# Patient Record
Sex: Female | Born: 1935
Health system: Southern US, Community
[De-identification: ages and names within clinical notes are randomized; demographics above are authoritative.]

## PROBLEM LIST (undated history)

## (undated) DIAGNOSIS — R7303 Prediabetes: Secondary | ICD-10-CM

## (undated) DIAGNOSIS — I10 Essential (primary) hypertension: Secondary | ICD-10-CM

## (undated) DIAGNOSIS — L03116 Cellulitis of left lower limb: Secondary | ICD-10-CM

## (undated) DIAGNOSIS — IMO0002 Reserved for concepts with insufficient information to code with codable children: Secondary | ICD-10-CM

## (undated) DIAGNOSIS — M1990 Unspecified osteoarthritis, unspecified site: Secondary | ICD-10-CM

## (undated) DIAGNOSIS — F329 Major depressive disorder, single episode, unspecified: Secondary | ICD-10-CM

## (undated) DIAGNOSIS — K6289 Other specified diseases of anus and rectum: Secondary | ICD-10-CM

## (undated) DIAGNOSIS — F32A Depression, unspecified: Secondary | ICD-10-CM

## (undated) DIAGNOSIS — E785 Hyperlipidemia, unspecified: Secondary | ICD-10-CM

## (undated) DIAGNOSIS — I639 Cerebral infarction, unspecified: Secondary | ICD-10-CM

## (undated) DIAGNOSIS — C44301 Unspecified malignant neoplasm of skin of nose: Secondary | ICD-10-CM

## (undated) DIAGNOSIS — R32 Unspecified urinary incontinence: Secondary | ICD-10-CM

## (undated) DIAGNOSIS — Z9289 Personal history of other medical treatment: Secondary | ICD-10-CM

## (undated) DIAGNOSIS — T7840XA Allergy, unspecified, initial encounter: Secondary | ICD-10-CM

## (undated) HISTORY — DX: Cellulitis of left lower limb: L03.116

## (undated) HISTORY — DX: Unspecified osteoarthritis, unspecified site: M19.90

## (undated) HISTORY — DX: Allergy, unspecified, initial encounter: T78.40XA

## (undated) HISTORY — DX: Cerebral infarction, unspecified: I63.9

## (undated) HISTORY — DX: Unspecified malignant neoplasm of skin of nose: C44.301

## (undated) HISTORY — DX: Prediabetes: R73.03

## (undated) HISTORY — DX: Unspecified urinary incontinence: R32

## (undated) HISTORY — DX: Depression, unspecified: F32.A

## (undated) HISTORY — DX: Reserved for concepts with insufficient information to code with codable children: IMO0002

## (undated) HISTORY — DX: Hyperlipidemia, unspecified: E78.5

## (undated) HISTORY — DX: Personal history of other medical treatment: Z92.89

## (undated) HISTORY — DX: Major depressive disorder, single episode, unspecified: F32.9

## (undated) HISTORY — DX: Essential (primary) hypertension: I10

## (undated) HISTORY — PX: TUBAL LIGATION: SHX77

---
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
## 2004-07-20 LAB — HM MAMMOGRAPHY

## 2005-07-20 HISTORY — PX: SKIN CANCER EXCISION: SHX779

## 2006-07-20 LAB — HM PAP SMEAR

## 2011-07-21 DIAGNOSIS — Z87898 Personal history of other specified conditions: Secondary | ICD-10-CM | POA: Diagnosis not present

## 2011-07-24 DIAGNOSIS — E119 Type 2 diabetes mellitus without complications: Secondary | ICD-10-CM | POA: Diagnosis not present

## 2011-07-24 DIAGNOSIS — H25019 Cortical age-related cataract, unspecified eye: Secondary | ICD-10-CM | POA: Diagnosis not present

## 2011-08-21 DIAGNOSIS — Z87898 Personal history of other specified conditions: Secondary | ICD-10-CM | POA: Diagnosis not present

## 2011-09-18 DIAGNOSIS — Z87898 Personal history of other specified conditions: Secondary | ICD-10-CM | POA: Diagnosis not present

## 2011-10-08 DIAGNOSIS — M545 Low back pain, unspecified: Secondary | ICD-10-CM | POA: Diagnosis not present

## 2011-10-08 DIAGNOSIS — R569 Unspecified convulsions: Secondary | ICD-10-CM | POA: Diagnosis not present

## 2011-10-08 DIAGNOSIS — I69919 Unspecified symptoms and signs involving cognitive functions following unspecified cerebrovascular disease: Secondary | ICD-10-CM | POA: Diagnosis not present

## 2011-10-19 DIAGNOSIS — Z87898 Personal history of other specified conditions: Secondary | ICD-10-CM | POA: Diagnosis not present

## 2011-11-05 DIAGNOSIS — E119 Type 2 diabetes mellitus without complications: Secondary | ICD-10-CM | POA: Diagnosis not present

## 2011-11-12 DIAGNOSIS — I10 Essential (primary) hypertension: Secondary | ICD-10-CM | POA: Diagnosis not present

## 2011-11-12 DIAGNOSIS — R569 Unspecified convulsions: Secondary | ICD-10-CM | POA: Diagnosis not present

## 2011-11-12 DIAGNOSIS — E785 Hyperlipidemia, unspecified: Secondary | ICD-10-CM | POA: Diagnosis not present

## 2011-11-12 DIAGNOSIS — E119 Type 2 diabetes mellitus without complications: Secondary | ICD-10-CM | POA: Diagnosis not present

## 2011-11-18 DIAGNOSIS — Z87898 Personal history of other specified conditions: Secondary | ICD-10-CM | POA: Diagnosis not present

## 2011-12-19 DIAGNOSIS — Z87898 Personal history of other specified conditions: Secondary | ICD-10-CM | POA: Diagnosis not present

## 2012-01-18 DIAGNOSIS — Z87898 Personal history of other specified conditions: Secondary | ICD-10-CM | POA: Diagnosis not present

## 2012-02-18 DIAGNOSIS — Z87898 Personal history of other specified conditions: Secondary | ICD-10-CM | POA: Diagnosis not present

## 2012-02-26 DIAGNOSIS — R1031 Right lower quadrant pain: Secondary | ICD-10-CM | POA: Diagnosis not present

## 2012-02-26 DIAGNOSIS — I10 Essential (primary) hypertension: Secondary | ICD-10-CM | POA: Diagnosis not present

## 2012-02-26 DIAGNOSIS — N390 Urinary tract infection, site not specified: Secondary | ICD-10-CM | POA: Diagnosis not present

## 2012-02-26 DIAGNOSIS — R1903 Right lower quadrant abdominal swelling, mass and lump: Secondary | ICD-10-CM | POA: Diagnosis not present

## 2012-02-29 DIAGNOSIS — R1903 Right lower quadrant abdominal swelling, mass and lump: Secondary | ICD-10-CM | POA: Diagnosis not present

## 2012-03-09 DIAGNOSIS — I2510 Atherosclerotic heart disease of native coronary artery without angina pectoris: Secondary | ICD-10-CM | POA: Diagnosis not present

## 2012-03-09 DIAGNOSIS — N83209 Unspecified ovarian cyst, unspecified side: Secondary | ICD-10-CM | POA: Diagnosis not present

## 2012-03-09 DIAGNOSIS — E119 Type 2 diabetes mellitus without complications: Secondary | ICD-10-CM | POA: Diagnosis not present

## 2012-03-10 DIAGNOSIS — E785 Hyperlipidemia, unspecified: Secondary | ICD-10-CM | POA: Diagnosis not present

## 2012-03-10 DIAGNOSIS — R1903 Right lower quadrant abdominal swelling, mass and lump: Secondary | ICD-10-CM | POA: Diagnosis not present

## 2012-03-10 DIAGNOSIS — I10 Essential (primary) hypertension: Secondary | ICD-10-CM | POA: Diagnosis not present

## 2012-03-10 DIAGNOSIS — F411 Generalized anxiety disorder: Secondary | ICD-10-CM | POA: Diagnosis not present

## 2012-03-10 DIAGNOSIS — E119 Type 2 diabetes mellitus without complications: Secondary | ICD-10-CM | POA: Diagnosis not present

## 2012-03-10 DIAGNOSIS — R569 Unspecified convulsions: Secondary | ICD-10-CM | POA: Diagnosis not present

## 2012-03-15 DIAGNOSIS — Z01818 Encounter for other preprocedural examination: Secondary | ICD-10-CM | POA: Diagnosis not present

## 2012-03-15 DIAGNOSIS — R1909 Other intra-abdominal and pelvic swelling, mass and lump: Secondary | ICD-10-CM | POA: Diagnosis not present

## 2012-03-15 DIAGNOSIS — Z01810 Encounter for preprocedural cardiovascular examination: Secondary | ICD-10-CM | POA: Diagnosis not present

## 2012-03-17 DIAGNOSIS — K5900 Constipation, unspecified: Secondary | ICD-10-CM | POA: Diagnosis not present

## 2012-03-17 DIAGNOSIS — R1909 Other intra-abdominal and pelvic swelling, mass and lump: Secondary | ICD-10-CM | POA: Diagnosis not present

## 2012-03-17 DIAGNOSIS — E119 Type 2 diabetes mellitus without complications: Secondary | ICD-10-CM | POA: Diagnosis not present

## 2012-03-17 DIAGNOSIS — I69919 Unspecified symptoms and signs involving cognitive functions following unspecified cerebrovascular disease: Secondary | ICD-10-CM | POA: Diagnosis not present

## 2012-03-17 DIAGNOSIS — I10 Essential (primary) hypertension: Secondary | ICD-10-CM | POA: Diagnosis not present

## 2012-03-17 DIAGNOSIS — Z01818 Encounter for other preprocedural examination: Secondary | ICD-10-CM | POA: Diagnosis not present

## 2012-03-20 DIAGNOSIS — Z87898 Personal history of other specified conditions: Secondary | ICD-10-CM | POA: Diagnosis not present

## 2012-03-22 DIAGNOSIS — Z01818 Encounter for other preprocedural examination: Secondary | ICD-10-CM | POA: Diagnosis not present

## 2012-03-22 DIAGNOSIS — I7090 Unspecified atherosclerosis: Secondary | ICD-10-CM | POA: Diagnosis not present

## 2012-03-24 DIAGNOSIS — N838 Other noninflammatory disorders of ovary, fallopian tube and broad ligament: Secondary | ICD-10-CM | POA: Diagnosis not present

## 2012-03-24 DIAGNOSIS — N8353 Torsion of ovary, ovarian pedicle and fallopian tube: Secondary | ICD-10-CM | POA: Diagnosis not present

## 2012-03-24 DIAGNOSIS — E669 Obesity, unspecified: Secondary | ICD-10-CM | POA: Diagnosis not present

## 2012-03-24 DIAGNOSIS — D4959 Neoplasm of unspecified behavior of other genitourinary organ: Secondary | ICD-10-CM | POA: Diagnosis not present

## 2012-03-24 DIAGNOSIS — I2510 Atherosclerotic heart disease of native coronary artery without angina pectoris: Secondary | ICD-10-CM | POA: Diagnosis not present

## 2012-03-24 DIAGNOSIS — N839 Noninflammatory disorder of ovary, fallopian tube and broad ligament, unspecified: Secondary | ICD-10-CM | POA: Diagnosis not present

## 2012-03-24 DIAGNOSIS — E785 Hyperlipidemia, unspecified: Secondary | ICD-10-CM | POA: Diagnosis not present

## 2012-03-24 DIAGNOSIS — R0602 Shortness of breath: Secondary | ICD-10-CM | POA: Diagnosis not present

## 2012-03-24 DIAGNOSIS — Z6836 Body mass index (BMI) 36.0-36.9, adult: Secondary | ICD-10-CM | POA: Diagnosis not present

## 2012-03-24 DIAGNOSIS — E119 Type 2 diabetes mellitus without complications: Secondary | ICD-10-CM | POA: Diagnosis not present

## 2012-03-24 DIAGNOSIS — N83209 Unspecified ovarian cyst, unspecified side: Secondary | ICD-10-CM | POA: Diagnosis not present

## 2012-03-24 DIAGNOSIS — I10 Essential (primary) hypertension: Secondary | ICD-10-CM | POA: Diagnosis not present

## 2012-03-25 DIAGNOSIS — E119 Type 2 diabetes mellitus without complications: Secondary | ICD-10-CM | POA: Diagnosis not present

## 2012-03-25 DIAGNOSIS — E669 Obesity, unspecified: Secondary | ICD-10-CM | POA: Diagnosis not present

## 2012-03-25 DIAGNOSIS — I2510 Atherosclerotic heart disease of native coronary artery without angina pectoris: Secondary | ICD-10-CM | POA: Diagnosis not present

## 2012-03-25 DIAGNOSIS — I10 Essential (primary) hypertension: Secondary | ICD-10-CM | POA: Diagnosis not present

## 2012-03-25 DIAGNOSIS — N83209 Unspecified ovarian cyst, unspecified side: Secondary | ICD-10-CM | POA: Diagnosis not present

## 2012-03-25 DIAGNOSIS — E785 Hyperlipidemia, unspecified: Secondary | ICD-10-CM | POA: Diagnosis not present

## 2012-04-19 DIAGNOSIS — Z87898 Personal history of other specified conditions: Secondary | ICD-10-CM | POA: Diagnosis not present

## 2012-05-20 DIAGNOSIS — Z87898 Personal history of other specified conditions: Secondary | ICD-10-CM | POA: Diagnosis not present

## 2012-06-14 DIAGNOSIS — E119 Type 2 diabetes mellitus without complications: Secondary | ICD-10-CM | POA: Diagnosis not present

## 2012-06-19 DIAGNOSIS — Z87898 Personal history of other specified conditions: Secondary | ICD-10-CM | POA: Diagnosis not present

## 2012-06-19 HISTORY — PX: BILATERAL OOPHORECTOMY: SHX1221

## 2012-06-24 DIAGNOSIS — F341 Dysthymic disorder: Secondary | ICD-10-CM | POA: Diagnosis not present

## 2012-06-24 DIAGNOSIS — R569 Unspecified convulsions: Secondary | ICD-10-CM | POA: Diagnosis not present

## 2012-06-24 DIAGNOSIS — E785 Hyperlipidemia, unspecified: Secondary | ICD-10-CM | POA: Diagnosis not present

## 2012-06-24 DIAGNOSIS — I10 Essential (primary) hypertension: Secondary | ICD-10-CM | POA: Diagnosis not present

## 2012-06-24 DIAGNOSIS — E119 Type 2 diabetes mellitus without complications: Secondary | ICD-10-CM | POA: Diagnosis not present

## 2012-06-30 DIAGNOSIS — Z85828 Personal history of other malignant neoplasm of skin: Secondary | ICD-10-CM | POA: Diagnosis not present

## 2012-06-30 DIAGNOSIS — L821 Other seborrheic keratosis: Secondary | ICD-10-CM | POA: Diagnosis not present

## 2012-06-30 DIAGNOSIS — L97309 Non-pressure chronic ulcer of unspecified ankle with unspecified severity: Secondary | ICD-10-CM | POA: Diagnosis not present

## 2012-06-30 DIAGNOSIS — I781 Nevus, non-neoplastic: Secondary | ICD-10-CM | POA: Diagnosis not present

## 2012-07-20 DIAGNOSIS — Z87898 Personal history of other specified conditions: Secondary | ICD-10-CM | POA: Diagnosis not present

## 2012-08-04 DIAGNOSIS — L02419 Cutaneous abscess of limb, unspecified: Secondary | ICD-10-CM | POA: Diagnosis not present

## 2012-08-04 DIAGNOSIS — D638 Anemia in other chronic diseases classified elsewhere: Secondary | ICD-10-CM | POA: Diagnosis not present

## 2012-08-04 DIAGNOSIS — R32 Unspecified urinary incontinence: Secondary | ICD-10-CM | POA: Diagnosis not present

## 2012-08-04 DIAGNOSIS — R569 Unspecified convulsions: Secondary | ICD-10-CM | POA: Diagnosis not present

## 2012-08-04 DIAGNOSIS — M545 Low back pain, unspecified: Secondary | ICD-10-CM | POA: Diagnosis not present

## 2012-08-04 DIAGNOSIS — N318 Other neuromuscular dysfunction of bladder: Secondary | ICD-10-CM | POA: Diagnosis not present

## 2012-08-04 DIAGNOSIS — F0781 Postconcussional syndrome: Secondary | ICD-10-CM | POA: Diagnosis not present

## 2012-08-04 DIAGNOSIS — R609 Edema, unspecified: Secondary | ICD-10-CM | POA: Diagnosis not present

## 2012-08-04 DIAGNOSIS — Z888 Allergy status to other drugs, medicaments and biological substances status: Secondary | ICD-10-CM | POA: Diagnosis not present

## 2012-08-04 DIAGNOSIS — G8929 Other chronic pain: Secondary | ICD-10-CM | POA: Diagnosis not present

## 2012-08-04 DIAGNOSIS — N179 Acute kidney failure, unspecified: Secondary | ICD-10-CM | POA: Diagnosis not present

## 2012-08-04 DIAGNOSIS — I6789 Other cerebrovascular disease: Secondary | ICD-10-CM | POA: Diagnosis not present

## 2012-08-04 DIAGNOSIS — I519 Heart disease, unspecified: Secondary | ICD-10-CM | POA: Diagnosis not present

## 2012-08-04 DIAGNOSIS — I499 Cardiac arrhythmia, unspecified: Secondary | ICD-10-CM | POA: Diagnosis not present

## 2012-08-04 DIAGNOSIS — I872 Venous insufficiency (chronic) (peripheral): Secondary | ICD-10-CM | POA: Diagnosis not present

## 2012-08-04 DIAGNOSIS — Z885 Allergy status to narcotic agent status: Secondary | ICD-10-CM | POA: Diagnosis not present

## 2012-08-04 DIAGNOSIS — R42 Dizziness and giddiness: Secondary | ICD-10-CM | POA: Diagnosis not present

## 2012-08-04 DIAGNOSIS — Z887 Allergy status to serum and vaccine status: Secondary | ICD-10-CM | POA: Diagnosis not present

## 2012-08-04 DIAGNOSIS — Z79899 Other long term (current) drug therapy: Secondary | ICD-10-CM | POA: Diagnosis not present

## 2012-08-04 DIAGNOSIS — R51 Headache: Secondary | ICD-10-CM | POA: Diagnosis not present

## 2012-08-04 DIAGNOSIS — Z8673 Personal history of transient ischemic attack (TIA), and cerebral infarction without residual deficits: Secondary | ICD-10-CM | POA: Diagnosis not present

## 2012-08-04 DIAGNOSIS — R350 Frequency of micturition: Secondary | ICD-10-CM | POA: Diagnosis not present

## 2012-08-04 DIAGNOSIS — S0990XA Unspecified injury of head, initial encounter: Secondary | ICD-10-CM | POA: Diagnosis not present

## 2012-08-04 DIAGNOSIS — L03119 Cellulitis of unspecified part of limb: Secondary | ICD-10-CM | POA: Diagnosis not present

## 2012-08-04 DIAGNOSIS — F411 Generalized anxiety disorder: Secondary | ICD-10-CM | POA: Diagnosis not present

## 2012-08-04 DIAGNOSIS — E119 Type 2 diabetes mellitus without complications: Secondary | ICD-10-CM | POA: Diagnosis not present

## 2012-08-04 DIAGNOSIS — F329 Major depressive disorder, single episode, unspecified: Secondary | ICD-10-CM | POA: Diagnosis not present

## 2012-08-05 DIAGNOSIS — R55 Syncope and collapse: Secondary | ICD-10-CM | POA: Diagnosis not present

## 2012-08-05 DIAGNOSIS — N318 Other neuromuscular dysfunction of bladder: Secondary | ICD-10-CM | POA: Diagnosis not present

## 2012-08-05 DIAGNOSIS — L02419 Cutaneous abscess of limb, unspecified: Secondary | ICD-10-CM | POA: Diagnosis not present

## 2012-08-05 DIAGNOSIS — S0990XA Unspecified injury of head, initial encounter: Secondary | ICD-10-CM | POA: Diagnosis not present

## 2012-08-05 DIAGNOSIS — N179 Acute kidney failure, unspecified: Secondary | ICD-10-CM | POA: Diagnosis not present

## 2012-08-05 DIAGNOSIS — R42 Dizziness and giddiness: Secondary | ICD-10-CM | POA: Diagnosis not present

## 2012-08-05 DIAGNOSIS — R569 Unspecified convulsions: Secondary | ICD-10-CM | POA: Diagnosis not present

## 2012-08-05 DIAGNOSIS — I872 Venous insufficiency (chronic) (peripheral): Secondary | ICD-10-CM | POA: Diagnosis not present

## 2012-08-05 DIAGNOSIS — F0781 Postconcussional syndrome: Secondary | ICD-10-CM | POA: Diagnosis not present

## 2012-08-06 DIAGNOSIS — I872 Venous insufficiency (chronic) (peripheral): Secondary | ICD-10-CM | POA: Diagnosis not present

## 2012-08-06 DIAGNOSIS — N318 Other neuromuscular dysfunction of bladder: Secondary | ICD-10-CM | POA: Diagnosis not present

## 2012-08-06 DIAGNOSIS — L02419 Cutaneous abscess of limb, unspecified: Secondary | ICD-10-CM | POA: Diagnosis not present

## 2012-08-06 DIAGNOSIS — R42 Dizziness and giddiness: Secondary | ICD-10-CM | POA: Diagnosis not present

## 2012-08-06 DIAGNOSIS — R569 Unspecified convulsions: Secondary | ICD-10-CM | POA: Diagnosis not present

## 2012-08-06 DIAGNOSIS — N179 Acute kidney failure, unspecified: Secondary | ICD-10-CM | POA: Diagnosis not present

## 2012-08-06 DIAGNOSIS — S0990XA Unspecified injury of head, initial encounter: Secondary | ICD-10-CM | POA: Diagnosis not present

## 2012-08-06 DIAGNOSIS — F0781 Postconcussional syndrome: Secondary | ICD-10-CM | POA: Diagnosis not present

## 2012-08-07 DIAGNOSIS — L02419 Cutaneous abscess of limb, unspecified: Secondary | ICD-10-CM | POA: Diagnosis not present

## 2012-08-07 DIAGNOSIS — R42 Dizziness and giddiness: Secondary | ICD-10-CM | POA: Diagnosis not present

## 2012-08-07 DIAGNOSIS — N179 Acute kidney failure, unspecified: Secondary | ICD-10-CM | POA: Diagnosis not present

## 2012-08-07 DIAGNOSIS — R569 Unspecified convulsions: Secondary | ICD-10-CM | POA: Diagnosis not present

## 2012-08-07 DIAGNOSIS — F0781 Postconcussional syndrome: Secondary | ICD-10-CM | POA: Diagnosis not present

## 2012-08-07 DIAGNOSIS — S0990XA Unspecified injury of head, initial encounter: Secondary | ICD-10-CM | POA: Diagnosis not present

## 2012-08-07 DIAGNOSIS — N318 Other neuromuscular dysfunction of bladder: Secondary | ICD-10-CM | POA: Diagnosis not present

## 2012-08-07 DIAGNOSIS — I872 Venous insufficiency (chronic) (peripheral): Secondary | ICD-10-CM | POA: Diagnosis not present

## 2012-08-08 DIAGNOSIS — R42 Dizziness and giddiness: Secondary | ICD-10-CM | POA: Diagnosis not present

## 2012-08-08 DIAGNOSIS — F0781 Postconcussional syndrome: Secondary | ICD-10-CM | POA: Diagnosis not present

## 2012-08-08 DIAGNOSIS — N179 Acute kidney failure, unspecified: Secondary | ICD-10-CM | POA: Diagnosis not present

## 2012-08-08 DIAGNOSIS — I872 Venous insufficiency (chronic) (peripheral): Secondary | ICD-10-CM | POA: Diagnosis not present

## 2012-08-08 DIAGNOSIS — R569 Unspecified convulsions: Secondary | ICD-10-CM | POA: Diagnosis not present

## 2012-08-08 DIAGNOSIS — L02419 Cutaneous abscess of limb, unspecified: Secondary | ICD-10-CM | POA: Diagnosis not present

## 2012-08-08 DIAGNOSIS — L03119 Cellulitis of unspecified part of limb: Secondary | ICD-10-CM | POA: Diagnosis not present

## 2012-08-08 DIAGNOSIS — N318 Other neuromuscular dysfunction of bladder: Secondary | ICD-10-CM | POA: Diagnosis not present

## 2012-08-08 DIAGNOSIS — S0990XA Unspecified injury of head, initial encounter: Secondary | ICD-10-CM | POA: Diagnosis not present

## 2012-08-09 DIAGNOSIS — K5900 Constipation, unspecified: Secondary | ICD-10-CM | POA: Diagnosis not present

## 2012-08-09 DIAGNOSIS — R42 Dizziness and giddiness: Secondary | ICD-10-CM | POA: Diagnosis not present

## 2012-08-09 DIAGNOSIS — M545 Low back pain, unspecified: Secondary | ICD-10-CM | POA: Diagnosis not present

## 2012-08-09 DIAGNOSIS — F0781 Postconcussional syndrome: Secondary | ICD-10-CM | POA: Diagnosis not present

## 2012-08-09 DIAGNOSIS — Z5189 Encounter for other specified aftercare: Secondary | ICD-10-CM | POA: Diagnosis not present

## 2012-08-09 DIAGNOSIS — Z9181 History of falling: Secondary | ICD-10-CM | POA: Diagnosis not present

## 2012-08-09 DIAGNOSIS — R569 Unspecified convulsions: Secondary | ICD-10-CM | POA: Diagnosis not present

## 2012-08-09 DIAGNOSIS — IMO0001 Reserved for inherently not codable concepts without codable children: Secondary | ICD-10-CM | POA: Diagnosis not present

## 2012-08-09 DIAGNOSIS — N318 Other neuromuscular dysfunction of bladder: Secondary | ICD-10-CM | POA: Diagnosis not present

## 2012-08-09 DIAGNOSIS — Z8673 Personal history of transient ischemic attack (TIA), and cerebral infarction without residual deficits: Secondary | ICD-10-CM | POA: Diagnosis not present

## 2012-08-09 DIAGNOSIS — A499 Bacterial infection, unspecified: Secondary | ICD-10-CM | POA: Diagnosis not present

## 2012-08-09 DIAGNOSIS — N179 Acute kidney failure, unspecified: Secondary | ICD-10-CM | POA: Diagnosis not present

## 2012-08-09 DIAGNOSIS — R5381 Other malaise: Secondary | ICD-10-CM | POA: Diagnosis not present

## 2012-08-09 DIAGNOSIS — M2170 Unequal limb length (acquired), unspecified site: Secondary | ICD-10-CM | POA: Diagnosis not present

## 2012-08-09 DIAGNOSIS — L02419 Cutaneous abscess of limb, unspecified: Secondary | ICD-10-CM | POA: Diagnosis not present

## 2012-08-09 DIAGNOSIS — N390 Urinary tract infection, site not specified: Secondary | ICD-10-CM | POA: Diagnosis not present

## 2012-08-09 DIAGNOSIS — E119 Type 2 diabetes mellitus without complications: Secondary | ICD-10-CM | POA: Diagnosis not present

## 2012-08-09 DIAGNOSIS — I519 Heart disease, unspecified: Secondary | ICD-10-CM | POA: Diagnosis not present

## 2012-08-09 DIAGNOSIS — Z87898 Personal history of other specified conditions: Secondary | ICD-10-CM | POA: Diagnosis not present

## 2012-08-09 DIAGNOSIS — S069X9S Unspecified intracranial injury with loss of consciousness of unspecified duration, sequela: Secondary | ICD-10-CM | POA: Diagnosis not present

## 2012-08-09 DIAGNOSIS — D638 Anemia in other chronic diseases classified elsewhere: Secondary | ICD-10-CM | POA: Diagnosis not present

## 2012-08-09 DIAGNOSIS — S060X9A Concussion with loss of consciousness of unspecified duration, initial encounter: Secondary | ICD-10-CM | POA: Diagnosis not present

## 2012-08-09 DIAGNOSIS — I10 Essential (primary) hypertension: Secondary | ICD-10-CM | POA: Diagnosis not present

## 2012-08-09 DIAGNOSIS — S0990XA Unspecified injury of head, initial encounter: Secondary | ICD-10-CM | POA: Diagnosis not present

## 2012-08-09 DIAGNOSIS — G40909 Epilepsy, unspecified, not intractable, without status epilepticus: Secondary | ICD-10-CM | POA: Diagnosis not present

## 2012-08-09 DIAGNOSIS — F329 Major depressive disorder, single episode, unspecified: Secondary | ICD-10-CM | POA: Diagnosis not present

## 2012-08-09 DIAGNOSIS — I872 Venous insufficiency (chronic) (peripheral): Secondary | ICD-10-CM | POA: Diagnosis not present

## 2012-08-09 DIAGNOSIS — I890 Lymphedema, not elsewhere classified: Secondary | ICD-10-CM | POA: Diagnosis not present

## 2012-08-09 DIAGNOSIS — E785 Hyperlipidemia, unspecified: Secondary | ICD-10-CM | POA: Diagnosis not present

## 2012-08-22 DIAGNOSIS — K5900 Constipation, unspecified: Secondary | ICD-10-CM | POA: Diagnosis not present

## 2012-08-26 DIAGNOSIS — M79609 Pain in unspecified limb: Secondary | ICD-10-CM | POA: Diagnosis not present

## 2012-08-26 DIAGNOSIS — I2510 Atherosclerotic heart disease of native coronary artery without angina pectoris: Secondary | ICD-10-CM | POA: Diagnosis not present

## 2012-08-26 DIAGNOSIS — E119 Type 2 diabetes mellitus without complications: Secondary | ICD-10-CM | POA: Diagnosis not present

## 2012-08-26 DIAGNOSIS — L02419 Cutaneous abscess of limb, unspecified: Secondary | ICD-10-CM | POA: Diagnosis not present

## 2012-08-26 DIAGNOSIS — R609 Edema, unspecified: Secondary | ICD-10-CM | POA: Diagnosis not present

## 2012-08-26 DIAGNOSIS — I890 Lymphedema, not elsewhere classified: Secondary | ICD-10-CM | POA: Diagnosis not present

## 2012-08-26 DIAGNOSIS — B351 Tinea unguium: Secondary | ICD-10-CM | POA: Diagnosis not present

## 2012-08-26 DIAGNOSIS — IMO0001 Reserved for inherently not codable concepts without codable children: Secondary | ICD-10-CM | POA: Diagnosis not present

## 2012-08-26 DIAGNOSIS — Z87898 Personal history of other specified conditions: Secondary | ICD-10-CM | POA: Diagnosis not present

## 2012-08-26 DIAGNOSIS — G40909 Epilepsy, unspecified, not intractable, without status epilepticus: Secondary | ICD-10-CM | POA: Diagnosis not present

## 2012-08-26 DIAGNOSIS — R5381 Other malaise: Secondary | ICD-10-CM | POA: Diagnosis not present

## 2012-08-26 DIAGNOSIS — I70209 Unspecified atherosclerosis of native arteries of extremities, unspecified extremity: Secondary | ICD-10-CM | POA: Diagnosis not present

## 2012-08-26 DIAGNOSIS — Z9181 History of falling: Secondary | ICD-10-CM | POA: Diagnosis not present

## 2012-08-26 DIAGNOSIS — N390 Urinary tract infection, site not specified: Secondary | ICD-10-CM | POA: Diagnosis not present

## 2012-09-01 DIAGNOSIS — G40909 Epilepsy, unspecified, not intractable, without status epilepticus: Secondary | ICD-10-CM | POA: Diagnosis not present

## 2012-09-01 DIAGNOSIS — I2510 Atherosclerotic heart disease of native coronary artery without angina pectoris: Secondary | ICD-10-CM | POA: Diagnosis not present

## 2012-09-01 DIAGNOSIS — Z9181 History of falling: Secondary | ICD-10-CM | POA: Diagnosis not present

## 2012-09-05 DIAGNOSIS — B351 Tinea unguium: Secondary | ICD-10-CM | POA: Diagnosis not present

## 2012-09-05 DIAGNOSIS — M79609 Pain in unspecified limb: Secondary | ICD-10-CM | POA: Diagnosis not present

## 2012-09-05 DIAGNOSIS — I70209 Unspecified atherosclerosis of native arteries of extremities, unspecified extremity: Secondary | ICD-10-CM | POA: Diagnosis not present

## 2012-09-08 DIAGNOSIS — L02419 Cutaneous abscess of limb, unspecified: Secondary | ICD-10-CM | POA: Diagnosis not present

## 2012-09-13 DIAGNOSIS — L03119 Cellulitis of unspecified part of limb: Secondary | ICD-10-CM | POA: Diagnosis not present

## 2012-09-13 DIAGNOSIS — R609 Edema, unspecified: Secondary | ICD-10-CM | POA: Diagnosis not present

## 2012-09-13 DIAGNOSIS — L02419 Cutaneous abscess of limb, unspecified: Secondary | ICD-10-CM | POA: Diagnosis not present

## 2012-09-20 DIAGNOSIS — I890 Lymphedema, not elsewhere classified: Secondary | ICD-10-CM | POA: Diagnosis not present

## 2012-10-18 DIAGNOSIS — Z87898 Personal history of other specified conditions: Secondary | ICD-10-CM | POA: Diagnosis not present

## 2012-11-09 ENCOUNTER — Other Ambulatory Visit (INDEPENDENT_AMBULATORY_CARE_PROVIDER_SITE_OTHER): Payer: Medicare Other

## 2012-11-09 ENCOUNTER — Encounter: Payer: Self-pay | Admitting: Internal Medicine

## 2012-11-09 ENCOUNTER — Ambulatory Visit (INDEPENDENT_AMBULATORY_CARE_PROVIDER_SITE_OTHER): Payer: Medicare Other | Admitting: Internal Medicine

## 2012-11-09 VITALS — BP 156/84 | HR 94 | Temp 99.3°F | Ht 63.0 in | Wt 243.8 lb

## 2012-11-09 DIAGNOSIS — E785 Hyperlipidemia, unspecified: Secondary | ICD-10-CM

## 2012-11-09 DIAGNOSIS — G40909 Epilepsy, unspecified, not intractable, without status epilepticus: Secondary | ICD-10-CM | POA: Insufficient documentation

## 2012-11-09 DIAGNOSIS — I10 Essential (primary) hypertension: Secondary | ICD-10-CM

## 2012-11-09 DIAGNOSIS — N390 Urinary tract infection, site not specified: Secondary | ICD-10-CM | POA: Diagnosis not present

## 2012-11-09 DIAGNOSIS — R7303 Prediabetes: Secondary | ICD-10-CM | POA: Insufficient documentation

## 2012-11-09 DIAGNOSIS — R7309 Other abnormal glucose: Secondary | ICD-10-CM

## 2012-11-09 DIAGNOSIS — R569 Unspecified convulsions: Secondary | ICD-10-CM | POA: Diagnosis not present

## 2012-11-09 LAB — URINALYSIS, ROUTINE W REFLEX MICROSCOPIC
Nitrite: NEGATIVE
Specific Gravity, Urine: 1.01 (ref 1.000–1.030)
Total Protein, Urine: NEGATIVE
Urine Glucose: NEGATIVE
pH: 6.5 (ref 5.0–8.0)

## 2012-11-09 LAB — BASIC METABOLIC PANEL
CO2: 29 mEq/L (ref 19–32)
Calcium: 9.3 mg/dL (ref 8.4–10.5)
GFR: 72.21 mL/min (ref >60.00)
Sodium: 138 mEq/L (ref 135–145)

## 2012-11-09 LAB — LIPID PANEL
Cholesterol: 180 mg/dL (ref 0–200)
HDL: 53.5 mg/dL (ref >39.00)
Triglycerides: 110 mg/dL (ref 0.0–149.0)

## 2012-11-09 LAB — CBC
HCT: 43.4 % (ref 36.0–46.0)
MCHC: 34.1 g/dL (ref 30.0–36.0)
MCV: 98.8 fl (ref 78.0–100.0)
RDW: 13.3 % (ref 11.5–14.6)
WBC: 9.9 10*3/uL (ref 4.5–10.5)

## 2012-11-09 NOTE — Assessment & Plan Note (Signed)
Will check lipid profile today.  

## 2012-11-09 NOTE — Patient Instructions (Signed)

## 2012-11-09 NOTE — Assessment & Plan Note (Signed)
Will check A1C today Continue to work on diet and exercise

## 2012-11-09 NOTE — Assessment & Plan Note (Signed)
Pt encouraged to work on diet and exercise

## 2012-11-09 NOTE — Assessment & Plan Note (Signed)
Will check dilantin level today Pt does not want referral to neurology at this time

## 2012-11-09 NOTE — Progress Notes (Signed)
HPI  Pt presents to the clinic today to establish care. She recently moved down here from [place] to live with her daughter. She was living in a nursing home and started experiencing frequent falls. The daughter thought it was best to have her down here where she can keep a closer eye on her. She has no concerns today.  Flu: never Tetanus: never Pneumovax: never Colonoscopy: never Eye doctor: as needed Dentist: as needed Mammogram: 2009 Pap smear: 2009  Past Medical History  Diagnosis Date  . Stroke   . Hypertension   . Hyperlipidemia   . Arthritis   . Depression   . Ulcer   . History of blood transfusion   . Urinary incontinence   . Allergy   . Skin cancer of nose   . Cellulitis of left leg   . Pre-diabetes     Current Outpatient Prescriptions  Medication Sig Dispense Refill  . amitriptyline (ELAVIL) 25 MG tablet Take 25 mg by mouth at bedtime.      Marland Kitchen atorvastatin (LIPITOR) 80 MG tablet Take 80 mg by mouth daily.      . Cholecalciferol (VITAMIN D) 1000 UNITS capsule Take 1,000 Units by mouth daily.      . clopidogrel (PLAVIX) 75 MG tablet Take 75 mg by mouth daily.      Marland Kitchen diltiazem (CARDIZEM CD) 180 MG 24 hr capsule Take 180 mg by mouth daily.      Marland Kitchen docusate sodium (COLACE) 100 MG capsule Take 100 mg by mouth 2 (two) times daily.      . ferrous sulfate 325 (65 FE) MG EC tablet Take 325 mg by mouth daily.      Marland Kitchen gabapentin (NEURONTIN) 300 MG capsule 600 mg. 2 tablets in the morning and at bedtime, 1 tablet at 2pm      . hydrochlorothiazide (MICROZIDE) 12.5 MG capsule Take 12.5 mg by mouth daily.      Marland Kitchen lidocaine (LIDODERM) 5 % Place 1 patch onto the skin daily. Remove & Discard patch within 12 hours or as directed by MD      . lisinopril (PRINIVIL,ZESTRIL) 5 MG tablet Take 5 mg by mouth daily.      Marland Kitchen LORazepam (ATIVAN) 1 MG tablet Take 1 mg by mouth every 8 (eight) hours.      . nitroGLYCERIN (NITROSTAT) 0.4 MG SL tablet Place 0.4 mg under the tongue every 5 (five) minutes  as needed for chest pain.      Marland Kitchen oxybutynin (DITROPAN) 5 MG tablet Take 5 mg by mouth 3 (three) times daily.      . phenytoin (DILANTIN) 100 MG ER capsule Take 200 mg by mouth 2 (two) times daily.      . traZODone (DESYREL) 50 MG tablet Take 150 mg by mouth at bedtime.      . vitamin B-12 (CYANOCOBALAMIN) 1000 MCG tablet Take 1,000 mcg by mouth daily.       No current facility-administered medications for this visit.    Allergies  Allergen Reactions  . Codeine   . Ether     Family History  Problem Relation Age of Onset  . Heart disease Other     Parent  . Cancer Maternal Uncle     History   Social History  . Marital Status: Widowed    Spouse Name: N/A    Number of Children: 4  . Years of Education: 12   Occupational History  . Retired    Social History Main Topics  .  Smoking status: Never Smoker   . Smokeless tobacco: Never Used  . Alcohol Use: No  . Drug Use: No  . Sexually Active: No   Other Topics Concern  . Not on file   Social History Narrative   Regular exercise-no   Caffeine Use-yes    ROS:  Constitutional: Pt reports fatigue. Denies fever, malaise, headache or abrupt weight changes.  HEENT: Denies eye pain, eye redness, ear pain, ringing in the ears, wax buildup, runny nose, nasal congestion, bloody nose, or sore throat. Respiratory: Denies difficulty breathing, shortness of breath, cough or sputum production.   Cardiovascular: Pt reports swelling of the lower legs. Denies chest pain, chest tightness, palpitations or swelling in the hands.  Gastrointestinal: Denies abdominal pain, bloating, constipation, diarrhea or blood in the stool.  GU: Denies frequency, urgency, pain with urination, blood in urine, odor or discharge. Musculoskeletal: Pt reports difficulty with gait. Denies decrease in range of motion, muscle pain or joint pain and swelling.  Skin: Denies redness, rashes, lesions or ulcercations.  Neurological: Denies dizziness, difficulty with  memory, difficulty with speech or problems with balance and coordination.   No other specific complaints in a complete review of systems (except as listed in HPI above).  PE:  BP 156/84  Pulse 94  Temp(Src) 99.3 F (37.4 C) (Oral)  Ht 5\' 3"  (1.6 m)  Wt 243 lb 12 oz (110.564 kg)  BMI 43.19 kg/m2  SpO2 93% Wt Readings from Last 3 Encounters:  11/09/12 243 lb 12 oz (110.564 kg)    General: Appears her stated age, obese but well developed, well nourished in NAD. HEENT: Head: normal shape and size; Eyes: sclera white, no icterus, conjunctiva pink, PERRLA and EOMs intact; Ears: Tm's gray and intact, normal light reflex; Nose: mucosa pink and moist, septum midline; Throat/Mouth: Teeth present, mucosa pink and moist, no lesions or ulcerations noted.  Neck: Normal range of motion. Neck supple, trachea midline. No massses, lumps or thyromegaly present.  Cardiovascular: Normal rate and rhythm. S1,S2 noted.  No murmur, rubs or gallops noted. No JVD. 2 + swelling of BLE. No carotid bruits noted. Pulmonary/Chest: Normal effort and positive vesicular breath sounds. No respiratory distress. No wheezes, rales or ronchi noted.  Abdomen: Soft and nontender. Normal bowel sounds, no bruits noted. No distention or masses noted. Liver, spleen and kidneys non palpable. Musculoskeletal: Normal range of motion. No signs of joint swelling. Mild difficulty with gait. Uses walker Neurological: Alert and oriented. Cranial nerves II-XII intact. Coordination normal. +DTRs bilaterally. Psychiatric: Mood and affect normal. Behavior is normal. Judgment and thought content normal.      Assessment and Plan:  Health Maintenance:   Pt declines all immunizations No indication for colonoscopy, pap smear or mammogram Pt encouraged to visit eye doctor/ dentis yearly

## 2012-11-09 NOTE — Assessment & Plan Note (Signed)
Elevated today, pt does have history of white coat syndrome Log of BP's reviewed, well controlled at home Continue to avoid sodium in your diet Will continue current therapy at this time

## 2012-11-17 DIAGNOSIS — Z87898 Personal history of other specified conditions: Secondary | ICD-10-CM | POA: Diagnosis not present

## 2012-11-24 ENCOUNTER — Other Ambulatory Visit: Payer: Self-pay | Admitting: *Deleted

## 2012-11-24 MED ORDER — LORAZEPAM 1 MG PO TABS: 1.0000 mg | ORAL_TABLET | Freq: Three times a day (TID) | ORAL | Status: DC

## 2012-11-24 MED ORDER — OXYBUTYNIN CHLORIDE 5 MG PO TABS: 5.0000 mg | ORAL_TABLET | Freq: Three times a day (TID) | ORAL | Status: DC

## 2012-11-24 MED ORDER — PHENYTOIN SODIUM EXTENDED 100 MG PO CAPS: 200.0000 mg | ORAL_CAPSULE | Freq: Two times a day (BID) | ORAL | Status: DC

## 2012-11-24 MED ORDER — LISINOPRIL 5 MG PO TABS: 5.0000 mg | ORAL_TABLET | Freq: Every day | ORAL | Status: DC

## 2012-11-24 MED ORDER — GABAPENTIN 300 MG PO CAPS: ORAL_CAPSULE | ORAL | Status: DC

## 2012-11-24 MED ORDER — AMITRIPTYLINE HCL 25 MG PO TABS: 25.0000 mg | ORAL_TABLET | Freq: Every day | ORAL | Status: DC

## 2012-11-24 NOTE — Telephone Encounter (Signed)
Ativan Done hardcopy to robin  Gabapentin done erx  Other routine to robin

## 2012-11-24 NOTE — Telephone Encounter (Addendum)
Pt's daughter called for refills of Lisinopril, Lorazepam, Oxybutynin, Gabapentin, Amitriptyline, and Phenytoin. Please advise on Lorazepam rx in [place] rx's already sent to pharmacy.

## 2012-11-25 NOTE — Telephone Encounter (Signed)
Rx for Lorazepam faxed to [facility].

## 2012-12-08 ENCOUNTER — Telehealth: Payer: Self-pay

## 2012-12-08 MED ORDER — DILTIAZEM HCL ER COATED BEADS 180 MG PO CP24: 180.0000 mg | ORAL_CAPSULE | Freq: Every day | ORAL | Status: DC

## 2012-12-08 MED ORDER — TRAZODONE HCL 50 MG PO TABS: 150.0000 mg | ORAL_TABLET | Freq: Every day | ORAL | Status: DC

## 2012-12-08 MED ORDER — LIDOCAINE 5 % EX PTCH: 1.0000 | MEDICATED_PATCH | CUTANEOUS | Status: DC

## 2012-12-08 MED ORDER — HYDROCHLOROTHIAZIDE 12.5 MG PO CAPS: 12.5000 mg | ORAL_CAPSULE | Freq: Every day | ORAL | Status: DC

## 2012-12-08 MED ORDER — ATORVASTATIN CALCIUM 80 MG PO TABS: 80.0000 mg | ORAL_TABLET | Freq: Every day | ORAL | Status: DC

## 2012-12-08 NOTE — Telephone Encounter (Signed)
Appointment scheduled for 12/09/2012 at 8:30am.

## 2012-12-08 NOTE — Telephone Encounter (Signed)
Rx's sent to [facility] as requested, daughter informed. Transferred to scheduler to set up appointment with NP about DME.

## 2012-12-08 NOTE — Telephone Encounter (Signed)
Ok to refill all requested meds. Will need ov to discuss rx for SME such as wheelchair and bedside commode

## 2012-12-08 NOTE — Telephone Encounter (Signed)
Phone call from [facility]) [phone] she states patients legs having been giving out on her for the past few days. She can some what make it from the bed to the bathroom but her legs have given out while doing that. She has not hurt herself when she has fallen due to this. Requesting an order for wheelchair (keeping in mind pt is 250 lbs) and also a beside commode. She also states her and her husband to have a hard time getting her off the floor given her weight. Please advise.

## 2012-12-08 NOTE — Telephone Encounter (Signed)
Phone call from [name] again stating patient did not get refills on Lidoderm patch, Hydrochlorathizide, Lipitor, Trazadone, and Diltiazem. [name] asks is there a reason she should not be on these meds? Otherwise needs refills.

## 2012-12-09 ENCOUNTER — Encounter: Payer: Self-pay | Admitting: Internal Medicine

## 2012-12-09 ENCOUNTER — Ambulatory Visit (INDEPENDENT_AMBULATORY_CARE_PROVIDER_SITE_OTHER): Payer: Medicare Other | Admitting: Internal Medicine

## 2012-12-09 VITALS — BP 162/78 | HR 102 | Temp 97.8°F | Ht 63.0 in | Wt 247.5 lb

## 2012-12-09 DIAGNOSIS — R531 Weakness: Secondary | ICD-10-CM

## 2012-12-09 DIAGNOSIS — M79609 Pain in unspecified limb: Secondary | ICD-10-CM

## 2012-12-09 DIAGNOSIS — M79605 Pain in left leg: Secondary | ICD-10-CM

## 2012-12-09 DIAGNOSIS — R5381 Other malaise: Secondary | ICD-10-CM | POA: Diagnosis not present

## 2012-12-09 DIAGNOSIS — R5383 Other fatigue: Secondary | ICD-10-CM | POA: Diagnosis not present

## 2012-12-09 NOTE — Progress Notes (Signed)
Subjective:    Patient ID: Carol Page, female    DOB: 01-Jul-1936, 77 y.o.   MRN: [id number]  HPI  Pt presents to the clinic today for evaluation of a wheelchair and bedside commode. Due to her size, generalized weakness and leg pain, she is having difficulty getting around and making it to the bathroom in time. She is experiencing frequent falls without and trauma but due to her size, she is having difficulty getting up when she falls. Her family is very concerned.  Review of Systems      Past Medical History  Diagnosis Date  . Stroke   . Hypertension   . Hyperlipidemia   . Arthritis   . Depression   . Ulcer   . History of blood transfusion   . Urinary incontinence   . Allergy   . Skin cancer of nose   . Cellulitis of left leg   . Pre-diabetes     Current Outpatient Prescriptions  Medication Sig Dispense Refill  . amitriptyline (ELAVIL) 25 MG tablet Take 1 tablet (25 mg total) by mouth at bedtime.  30 tablet  5  . atorvastatin (LIPITOR) 80 MG tablet Take 1 tablet (80 mg total) by mouth daily.  30 tablet  5  . Cholecalciferol (VITAMIN D) 1000 UNITS capsule Take 1,000 Units by mouth daily.      . clopidogrel (PLAVIX) 75 MG tablet Take 75 mg by mouth daily.      Marland Kitchen diltiazem (CARDIZEM CD) 180 MG 24 hr capsule Take 1 capsule (180 mg total) by mouth daily.  30 capsule  5  . docusate sodium (COLACE) 100 MG capsule Take 100 mg by mouth 2 (two) times daily.      . ferrous sulfate 325 (65 FE) MG EC tablet Take 325 mg by mouth daily.      Marland Kitchen gabapentin (NEURONTIN) 300 MG capsule 2 capsules by mouth in the morning , 1 capsule at noon and 2 capsule at bedtime.  150 capsule  5  . hydrochlorothiazide (MICROZIDE) 12.5 MG capsule Take 1 capsule (12.5 mg total) by mouth daily.  30 capsule  5  . lidocaine (LIDODERM) 5 % Place 1 patch onto the skin daily. Remove & Discard patch within 12 hours or as directed by MD  30 patch  2  . lisinopril (PRINIVIL,ZESTRIL) 5 MG tablet Take 1 tablet (5 mg  total) by mouth daily.  30 tablet  5  . LORazepam (ATIVAN) 1 MG tablet Take 1 tablet (1 mg total) by mouth every 8 (eight) hours.  60 tablet  0  . nitroGLYCERIN (NITROSTAT) 0.4 MG SL tablet Place 0.4 mg under the tongue every 5 (five) minutes as needed for chest pain.      Marland Kitchen oxybutynin (DITROPAN) 5 MG tablet Take 1 tablet (5 mg total) by mouth 3 (three) times daily.  90 tablet  5  . phenytoin (DILANTIN) 100 MG ER capsule Take 2 capsules (200 mg total) by mouth 2 (two) times daily.  120 capsule  5  . traZODone (DESYREL) 50 MG tablet Take 3 tablets (150 mg total) by mouth at bedtime.  90 tablet  2  . vitamin B-12 (CYANOCOBALAMIN) 1000 MCG tablet Take 1,000 mcg by mouth daily.       No current facility-administered medications for this visit.    Allergies  Allergen Reactions  . Codeine   . Ether     Family History  Problem Relation Age of Onset  . Heart disease Other  Parent  . Cancer Maternal Uncle     History   Social History  . Marital Status: Widowed    Spouse Name: N/A    Number of Children: 4  . Years of Education: 12   Occupational History  . Retired    Social History Main Topics  . Smoking status: Never Smoker   . Smokeless tobacco: Never Used  . Alcohol Use: No  . Drug Use: No  . Sexually Active: No   Other Topics Concern  . Not on file   Social History Narrative   Regular exercise-no   Caffeine Use-yes     Constitutional: Denies fever, malaise, fatigue, headache or abrupt weight changes. . Musculoskeletal: Pt reports weakness and leg pain.  Denies decrease in range of motion,  or joint pain and swelling.  Neurological: Pt has problems with balance and coordination secondary to weakness. Denies dizziness, difficulty with memory, difficulty with speech.   No other specific complaints in a complete review of systems (except as listed in HPI above).  Objective:   Physical Exam   BP 162/78  Pulse 102  Temp(Src) 97.8 F (36.6 C) (Oral)  Ht 5\' 3"   (1.6 m)  Wt 247 lb 8 oz (112.265 kg)  BMI 43.85 kg/m2  SpO2 93% Wt Readings from Last 3 Encounters:  12/09/12 247 lb 8 oz (112.265 kg)  11/09/12 243 lb 12 oz (110.564 kg)    General: Appears her stated age, obese but well developed, well nourished in NAD.  Cardiovascular: Normal rate and rhythm. S1,S2 noted.  No murmur, rubs or gallops noted. No JVD or BLE edema. No carotid bruits noted. Pulmonary/Chest: Normal effort and positive vesicular breath sounds. No respiratory distress. No wheezes, rales or ronchi noted.  Musculoskeletal: Normal range of motion. No signs of joint swelling. Moderate difficulty with gait, using walker for assistance. Neurological: Alert and oriented. Cranial nerves II-XII intact.  +DTRs bilaterally.   BMET    Component Value Date/Time   NA 138 11/09/2012 1537   K 3.8 11/09/2012 1537   CL 100 11/09/2012 1537   CO2 29 11/09/2012 1537   GLUCOSE 98 11/09/2012 1537   BUN 13 11/09/2012 1537   CREATININE 0.8 11/09/2012 1537   CALCIUM 9.3 11/09/2012 1537    Lipid Panel     Component Value Date/Time   CHOL 180 11/09/2012 1537   TRIG 110.0 11/09/2012 1537   HDL 53.50 11/09/2012 1537   CHOLHDL 3 11/09/2012 1537   VLDL 22.0 11/09/2012 1537   LDLCALC 105* 11/09/2012 1537    CBC    Component Value Date/Time   WBC 9.9 11/09/2012 1537   RBC 4.39 11/09/2012 1537   HGB 14.8 11/09/2012 1537   HCT 43.4 11/09/2012 1537   PLT 221.0 11/09/2012 1537   MCV 98.8 11/09/2012 1537   MCHC 34.1 11/09/2012 1537   RDW 13.3 11/09/2012 1537    Hgb A1C Lab Results  Component Value Date   HGBA1C 5.7 11/09/2012        Assessment & Plan:    Obesity, weakness and left leg pain, impairing mobility  RX provided for manual wheelchair and  Bedside commode Will aslo order home health to evaluate for PT  F/u in 1 month to assess effectiveness of PT

## 2012-12-09 NOTE — Patient Instructions (Signed)
Walker Use HOW TO TELL IF A WALKER IS THE RIGHT SIZE  With your arms hanging at your sides, the walker handles should be at wrist level. If you cannot find the exact fit, choose the height that is most comfortable.  If you have been instructed to not place weight on one of your legs, you may feel more comfortable with a shorter height. If you are using the walker for balance, you may prefer a taller height.  Adjust the height by using the push buttons on the legs of your walker.  In rest position, the back leg of the walkers should be no further ahead than your toes. With your hands resting on the grips, your elbows should be slightly bent at about a 30 degree angle.  Ask your physical therapist or caregiver if you have any concerns. HOW TO USE A STANDARD WALKER (NO WHEELS)  Pick your walker up (do not slide your walker) and place it one step length in front of you. The back legs of the walker should be no further ahead than your toes. You should not feel like you need to lean forward to keep your hands on the grips. As you set the walker down, make sure all 4 leg tips contact the ground at the same time.  Hold onto the walker for support and step forward with your weaker leg into the middle of the walker. Follow the weight bearing instructions your caregiver has given you.  Push down with your hands and step forward with your stronger leg.  Be careful not to let the walker get too far ahead of you as you walk.  Repeat the process for each step. HOW TO USE A FRONT-WHEELED WALKER  Slide your walker forward. The back legs of the walker should be no further ahead than your toes. You should not feel like you need to lean forward to keep your hands on the grips.  Hold onto the walker for support and step forward with your weaker leg into the middle of the walker. Follow the weight bearing instructions your caregiver has given you.  Push down with your hands and step forward with your stronger  leg.  Be careful not to let the walker get too far ahead of you as you walk.  Repeat the process for each step.  If your walker does not glide well over carpet, consider cutting an "x" in 2 old tennis balls and placing them over the back legs of your walker. STANDING UP FROM A CHAIR WITH ARMRESTS  It is best to sit in a firm chair with armrests.  Position your walker directly in front of your chair. Do not pull on the walker when standing up. It is too unstable to support weight when pulled on.  Slide forward in the chair, with your weaker leg ahead and stronger leg bent near the chair.  Lean forward and push up from your chair with both hands on the armrests. Straighten your stronger leg, rising to standing. Do not pull yourself up from the walker. This may cause it to tip.  When you feel steady on your feet, carefully move one hand at a time to the walker.  Stand for a few seconds to stabilize your balance before you start to walk. STANDING UP FROM A CHAIR WITHOUT ARMRESTS  It is best to sit in a firm chair. A low seat or an overstuffed chair or sofa is hard to get out of.  Place the walker in  front of you. Do not pull on the walker when coming to a standing position.  Slide forward in the chair, with your weaker leg ahead and stronger leg bent near the chair.  Push down on the chair seat with the hand opposite your weaker leg. Keep your other hand on the center of the walker's crossbar.  Stand, steady your balance, and place your hands on the walker handgrips. SITTING DOWN  Always back up toward your chair, using your walker, until you feel the back of your legs touch the chair.  If the chair has armrests, carefully reach back to put your hands on the armrests, and slowly lower your weight.  If the chair does not have armrests, consider backing up to the side of the chair. You can then hold onto the back of the chair and the front of the seat to slowly lower yourself.  You  should never feel like you are falling into your chair. USING A WALKER ON STEPS   Before attempting to use your walker on steps, practice with your physical therapist.  If you are going up a step wide enough to accommodate the entire walker and yourself:  First, place the walker up on the step.  Second, get your feet as close to the step as you can.  Third, press down on the walker with your hands as you step up with your stronger leg. Then step up with your weaker leg.  If you are going down a step wide enough to accommodate the entire walker and yourself:  First, place the walker down on the step.  Second, hold onto the walker as you step down with your weaker leg. Then step down with your stronger leg.  If you are going up more than 1 step and have a railing:  First, turn the walker sideways, so the opening is facing in toward you.  Second, place the front 2 legs of the walker on the first step. These front legs should be positioned at the base of the next step.  Third, test the steadiness of the walker. It should feel sturdy when you press down on the handgrip that is facing the top of the steps.  Finally, placing your weight on the railing and the walker, step up with your stronger leg first. Then step up with your weaker leg.  If you are going down more than 1 step and have a railing:  First, turn the walker sideways, so the opening is facing in toward you.  Second, place the front 2 legs of the walker down on the first step. When possible, the back legs of the walker should be positioned at the base of the previous step.  Third, test the steadiness of the walker. It should feel sturdy when you press down on the handgrip that is facing the top of the steps.  Finally, placing your weight on the railing and the walker, step down with your weaker leg first. Then step down with your stronger leg.  Be sure to check the sturdiness of the walker before each step.  Make sure  you have good rubber tips on the legs of your walker to prevent it from slipping. Document Released: 07/06/2005 Document Revised: 09/28/2011 Document Reviewed: 01/13/2011 [facility] Patient Information 2014 [place], [place].

## 2012-12-13 DIAGNOSIS — R262 Difficulty in walking, not elsewhere classified: Secondary | ICD-10-CM | POA: Diagnosis not present

## 2012-12-13 DIAGNOSIS — IMO0001 Reserved for inherently not codable concepts without codable children: Secondary | ICD-10-CM | POA: Diagnosis not present

## 2012-12-16 DIAGNOSIS — IMO0001 Reserved for inherently not codable concepts without codable children: Secondary | ICD-10-CM | POA: Diagnosis not present

## 2012-12-16 DIAGNOSIS — R262 Difficulty in walking, not elsewhere classified: Secondary | ICD-10-CM | POA: Diagnosis not present

## 2012-12-18 DIAGNOSIS — Z87898 Personal history of other specified conditions: Secondary | ICD-10-CM | POA: Diagnosis not present

## 2012-12-20 DIAGNOSIS — R262 Difficulty in walking, not elsewhere classified: Secondary | ICD-10-CM | POA: Diagnosis not present

## 2012-12-20 DIAGNOSIS — IMO0001 Reserved for inherently not codable concepts without codable children: Secondary | ICD-10-CM | POA: Diagnosis not present

## 2012-12-23 DIAGNOSIS — R262 Difficulty in walking, not elsewhere classified: Secondary | ICD-10-CM | POA: Diagnosis not present

## 2012-12-23 DIAGNOSIS — IMO0001 Reserved for inherently not codable concepts without codable children: Secondary | ICD-10-CM | POA: Diagnosis not present

## 2012-12-27 ENCOUNTER — Telehealth: Payer: Self-pay | Admitting: *Deleted

## 2012-12-27 DIAGNOSIS — IMO0001 Reserved for inherently not codable concepts without codable children: Secondary | ICD-10-CM | POA: Diagnosis not present

## 2012-12-27 DIAGNOSIS — R262 Difficulty in walking, not elsewhere classified: Secondary | ICD-10-CM | POA: Diagnosis not present

## 2012-12-27 NOTE — Telephone Encounter (Signed)
Left msg on triage stating wanting to inform md saw pt today BP was 170-90 after therapy. Advise daughter to monitor BP & if it still high to make f/u appt with md.../lmb

## 2012-12-28 ENCOUNTER — Other Ambulatory Visit: Payer: Self-pay | Admitting: Internal Medicine

## 2012-12-28 ENCOUNTER — Telehealth: Payer: Self-pay

## 2012-12-28 NOTE — Telephone Encounter (Signed)
Ok to refill, same dose, quantity and number of refills.

## 2012-12-28 NOTE — Telephone Encounter (Signed)
Please advise if ok to refill lorazepam for this pt to [facility]. Thanks

## 2012-12-28 NOTE — Telephone Encounter (Signed)
RX called in to pharmacy per request

## 2012-12-30 DIAGNOSIS — IMO0001 Reserved for inherently not codable concepts without codable children: Secondary | ICD-10-CM | POA: Diagnosis not present

## 2012-12-30 DIAGNOSIS — R262 Difficulty in walking, not elsewhere classified: Secondary | ICD-10-CM | POA: Diagnosis not present

## 2013-01-02 ENCOUNTER — Other Ambulatory Visit: Payer: Self-pay | Admitting: *Deleted

## 2013-01-02 MED ORDER — CLOPIDOGREL BISULFATE 75 MG PO TABS: 75.0000 mg | ORAL_TABLET | Freq: Every day | ORAL | Status: DC

## 2013-01-02 NOTE — Telephone Encounter (Signed)
R'cd fax from [facility] for refill of Plavix.

## 2013-01-02 NOTE — Telephone Encounter (Signed)
Rx faxed to [facility]. 

## 2013-01-02 NOTE — Telephone Encounter (Signed)
Last refilled 11/24/2012 #60 with 0 refills-please advise.

## 2013-01-03 DIAGNOSIS — IMO0001 Reserved for inherently not codable concepts without codable children: Secondary | ICD-10-CM | POA: Diagnosis not present

## 2013-01-03 DIAGNOSIS — R262 Difficulty in walking, not elsewhere classified: Secondary | ICD-10-CM | POA: Diagnosis not present

## 2013-01-06 DIAGNOSIS — R262 Difficulty in walking, not elsewhere classified: Secondary | ICD-10-CM | POA: Diagnosis not present

## 2013-01-06 DIAGNOSIS — IMO0001 Reserved for inherently not codable concepts without codable children: Secondary | ICD-10-CM | POA: Diagnosis not present

## 2013-01-17 DIAGNOSIS — Z87898 Personal history of other specified conditions: Secondary | ICD-10-CM | POA: Diagnosis not present

## 2013-01-18 DIAGNOSIS — R262 Difficulty in walking, not elsewhere classified: Secondary | ICD-10-CM | POA: Diagnosis not present

## 2013-01-18 DIAGNOSIS — IMO0001 Reserved for inherently not codable concepts without codable children: Secondary | ICD-10-CM | POA: Diagnosis not present

## 2013-01-18 DIAGNOSIS — M79609 Pain in unspecified limb: Secondary | ICD-10-CM | POA: Diagnosis not present

## 2013-02-17 DIAGNOSIS — Z87898 Personal history of other specified conditions: Secondary | ICD-10-CM | POA: Diagnosis not present

## 2013-03-20 DIAGNOSIS — Z87898 Personal history of other specified conditions: Secondary | ICD-10-CM | POA: Diagnosis not present

## 2013-03-27 ENCOUNTER — Other Ambulatory Visit: Payer: Self-pay | Admitting: Internal Medicine

## 2013-03-27 ENCOUNTER — Other Ambulatory Visit: Payer: Self-pay

## 2013-03-27 NOTE — Telephone Encounter (Signed)
Ok to refill? Last OV 5.23.14 Last filled 6.11.14

## 2013-04-19 ENCOUNTER — Telehealth: Payer: Self-pay | Admitting: *Deleted

## 2013-04-19 DIAGNOSIS — Z87898 Personal history of other specified conditions: Secondary | ICD-10-CM | POA: Diagnosis not present

## 2013-04-19 MED ORDER — LORAZEPAM 1 MG PO TABS: ORAL_TABLET | ORAL | Status: DC

## 2013-04-19 NOTE — Telephone Encounter (Signed)
Is the pt actually taking it TID. If it she is, ok to refill with quant #90

## 2013-04-19 NOTE — Telephone Encounter (Signed)
Carol Page called requesting Lorazepam 1mg  refill.  Carol Page states the rx is written for 60 tablets however Rx should be for 90 tablets due to pt taking medication every 8 hours.  Please advise

## 2013-05-16 ENCOUNTER — Other Ambulatory Visit: Payer: Self-pay

## 2013-05-16 MED ORDER — HYDROCHLOROTHIAZIDE 12.5 MG PO CAPS: 12.5000 mg | ORAL_CAPSULE | Freq: Every day | ORAL | Status: DC

## 2013-05-20 DIAGNOSIS — Z87898 Personal history of other specified conditions: Secondary | ICD-10-CM | POA: Diagnosis not present

## 2013-05-22 ENCOUNTER — Emergency Department (HOSPITAL_COMMUNITY): Payer: Medicare Other

## 2013-05-22 ENCOUNTER — Encounter (HOSPITAL_COMMUNITY): Payer: Self-pay | Admitting: Emergency Medicine

## 2013-05-22 ENCOUNTER — Inpatient Hospital Stay (HOSPITAL_COMMUNITY)
Admission: EM | Admit: 2013-05-22 | Discharge: 2013-05-24 | DRG: 871 | Disposition: A | Discharge status: Home / Self Care | Payer: Medicare Other | Attending: Internal Medicine | Admitting: Internal Medicine

## 2013-05-22 DIAGNOSIS — F329 Major depressive disorder, single episode, unspecified: Secondary | ICD-10-CM | POA: Diagnosis present

## 2013-05-22 DIAGNOSIS — E876 Hypokalemia: Secondary | ICD-10-CM | POA: Diagnosis present

## 2013-05-22 DIAGNOSIS — A419 Sepsis, unspecified organism: Secondary | ICD-10-CM

## 2013-05-22 DIAGNOSIS — I10 Essential (primary) hypertension: Secondary | ICD-10-CM | POA: Diagnosis present

## 2013-05-22 DIAGNOSIS — G9341 Metabolic encephalopathy: Secondary | ICD-10-CM | POA: Diagnosis present

## 2013-05-22 DIAGNOSIS — Z85828 Personal history of other malignant neoplasm of skin: Secondary | ICD-10-CM | POA: Diagnosis not present

## 2013-05-22 DIAGNOSIS — Z6841 Body Mass Index (BMI) 40.0 and over, adult: Secondary | ICD-10-CM | POA: Diagnosis not present

## 2013-05-22 DIAGNOSIS — E785 Hyperlipidemia, unspecified: Secondary | ICD-10-CM | POA: Diagnosis present

## 2013-05-22 DIAGNOSIS — N390 Urinary tract infection, site not specified: Secondary | ICD-10-CM | POA: Diagnosis not present

## 2013-05-22 DIAGNOSIS — Z8673 Personal history of transient ischemic attack (TIA), and cerebral infarction without residual deficits: Secondary | ICD-10-CM

## 2013-05-22 DIAGNOSIS — I959 Hypotension, unspecified: Secondary | ICD-10-CM | POA: Diagnosis not present

## 2013-05-22 DIAGNOSIS — F3289 Other specified depressive episodes: Secondary | ICD-10-CM | POA: Diagnosis present

## 2013-05-22 DIAGNOSIS — F411 Generalized anxiety disorder: Secondary | ICD-10-CM | POA: Diagnosis present

## 2013-05-22 DIAGNOSIS — J9819 Other pulmonary collapse: Secondary | ICD-10-CM | POA: Diagnosis not present

## 2013-05-22 LAB — CBC WITH DIFFERENTIAL/PLATELET
Eosinophils Relative: 0 % (ref 0–5)
HCT: 35.8 % — ABNORMAL LOW (ref 36.0–46.0)
Hemoglobin: 12.5 g/dL (ref 12.0–15.0)
Lymphocytes Relative: 3 % — ABNORMAL LOW (ref 12–46)
Lymphs Abs: 0.4 10*3/uL — ABNORMAL LOW (ref 0.7–4.0)
MCHC: 34.9 g/dL (ref 30.0–36.0)
MCV: 98.6 fL (ref 78.0–100.0)
Monocytes Absolute: 0.6 10*3/uL (ref 0.1–1.0)
Monocytes Relative: 4 % (ref 3–12)
Neutro Abs: 15.6 10*3/uL — ABNORMAL HIGH (ref 1.7–7.7)
RDW: 13.2 % (ref 11.5–15.5)
WBC: 16.7 10*3/uL — ABNORMAL HIGH (ref 4.0–10.5)

## 2013-05-22 LAB — URINE MICROSCOPIC-ADD ON

## 2013-05-22 LAB — URINALYSIS, ROUTINE W REFLEX MICROSCOPIC
Glucose, UA: NEGATIVE mg/dL
Nitrite: POSITIVE — AB
Protein, ur: 30 mg/dL — AB
Specific Gravity, Urine: 1.016 (ref 1.005–1.030)
Urobilinogen, UA: 1 mg/dL (ref 0.0–1.0)

## 2013-05-22 LAB — COMPREHENSIVE METABOLIC PANEL
BUN: 18 mg/dL (ref 6–23)
CO2: 23 mEq/L (ref 19–32)
Calcium: 8.7 mg/dL (ref 8.4–10.5)
Chloride: 97 mEq/L (ref 96–112)
Creatinine, Ser: 1.05 mg/dL (ref 0.50–1.10)
GFR calc Af Amer: 59 mL/min — ABNORMAL LOW (ref >90)
GFR calc non Af Amer: 51 mL/min — ABNORMAL LOW (ref >90)
Glucose, Bld: 122 mg/dL — ABNORMAL HIGH (ref 70–99)
Total Bilirubin: 0.5 mg/dL (ref 0.3–1.2)

## 2013-05-22 LAB — LACTIC ACID, PLASMA: Lactic Acid, Venous: 3.6 mmol/L — ABNORMAL HIGH (ref 0.5–2.2)

## 2013-05-22 LAB — GLUCOSE, CAPILLARY: Glucose-Capillary: 135 mg/dL — ABNORMAL HIGH (ref 70–99)

## 2013-05-22 MED ORDER — ATORVASTATIN CALCIUM 80 MG PO TABS
80.0000 mg | ORAL_TABLET | Freq: Every day | ORAL | Status: DC
  Administered 2013-05-23 – 2013-05-24 (×2): 80 mg via ORAL
  Filled 2013-05-22 (×2): qty 1

## 2013-05-22 MED ORDER — SODIUM CHLORIDE 0.9 % IV BOLUS (SEPSIS)
1000.0000 mL | Freq: Once | INTRAVENOUS | Status: DC
  Administered 2013-05-22: 1000 mL via INTRAVENOUS

## 2013-05-22 MED ORDER — PHENYTOIN SODIUM EXTENDED 100 MG PO CAPS
200.0000 mg | ORAL_CAPSULE | Freq: Two times a day (BID) | ORAL | Status: DC
Start: 2013-05-22 — End: 2013-05-24
  Administered 2013-05-22 – 2013-05-24 (×4): 200 mg via ORAL
  Filled 2013-05-22 (×5): qty 2

## 2013-05-22 MED ORDER — AMITRIPTYLINE HCL 25 MG PO TABS
25.0000 mg | ORAL_TABLET | Freq: Every day | ORAL | Status: DC
  Administered 2013-05-22 – 2013-05-23 (×2): 25 mg via ORAL
  Filled 2013-05-22 (×3): qty 1

## 2013-05-22 MED ORDER — CLOPIDOGREL BISULFATE 75 MG PO TABS
75.0000 mg | ORAL_TABLET | Freq: Every evening | ORAL | Status: DC
  Administered 2013-05-22 – 2013-05-23 (×2): 75 mg via ORAL
  Filled 2013-05-22 (×4): qty 1

## 2013-05-22 MED ORDER — SODIUM CHLORIDE 0.9 % IV BOLUS (SEPSIS): 250.0000 mL | INTRAVENOUS | Status: DC | PRN

## 2013-05-22 MED ORDER — GABAPENTIN 300 MG PO CAPS
600.0000 mg | ORAL_CAPSULE | Freq: Two times a day (BID) | ORAL | Status: DC
  Administered 2013-05-22 – 2013-05-24 (×4): 600 mg via ORAL
  Filled 2013-05-22 (×5): qty 2

## 2013-05-22 MED ORDER — FERROUS SULFATE 325 (65 FE) MG PO TABS
325.0000 mg | ORAL_TABLET | Freq: Every day | ORAL | Status: DC
  Administered 2013-05-23 – 2013-05-24 (×2): 325 mg via ORAL
  Filled 2013-05-22 (×3): qty 1

## 2013-05-22 MED ORDER — DILTIAZEM HCL ER COATED BEADS 180 MG PO CP24
180.0000 mg | ORAL_CAPSULE | Freq: Every day | ORAL | Status: DC
  Administered 2013-05-23 – 2013-05-24 (×2): 180 mg via ORAL
  Filled 2013-05-22 (×2): qty 1

## 2013-05-22 MED ORDER — SODIUM CHLORIDE 0.9 % IV SOLN
INTRAVENOUS | Status: DC
  Administered 2013-05-22 – 2013-05-23 (×3): via INTRAVENOUS

## 2013-05-22 MED ORDER — CEFTRIAXONE SODIUM 1 G IJ SOLR
1.0000 g | INTRAMUSCULAR | Status: DC
  Administered 2013-05-23: 1 g via INTRAVENOUS
  Filled 2013-05-22 (×2): qty 10

## 2013-05-22 MED ORDER — DOCUSATE SODIUM 100 MG PO CAPS
100.0000 mg | ORAL_CAPSULE | Freq: Two times a day (BID) | ORAL | Status: DC
  Administered 2013-05-22 – 2013-05-24 (×4): 100 mg via ORAL
  Filled 2013-05-22 (×5): qty 1

## 2013-05-22 MED ORDER — GABAPENTIN 300 MG PO CAPS
300.0000 mg | ORAL_CAPSULE | Freq: Three times a day (TID) | ORAL | Status: DC
  Administered 2013-05-22: 300 mg via ORAL
  Filled 2013-05-22: qty 1

## 2013-05-22 MED ORDER — GABAPENTIN 300 MG PO CAPS
300.0000 mg | ORAL_CAPSULE | Freq: Every day | ORAL | Status: DC
  Administered 2013-05-23 – 2013-05-24 (×2): 300 mg via ORAL
  Filled 2013-05-22 (×2): qty 1

## 2013-05-22 MED ORDER — ONDANSETRON HCL 4 MG PO TABS: 4.0000 mg | ORAL_TABLET | Freq: Four times a day (QID) | ORAL | Status: DC | PRN

## 2013-05-22 MED ORDER — ACETAMINOPHEN 500 MG PO TABS
1000.0000 mg | ORAL_TABLET | Freq: Once | ORAL | Status: AC
  Administered 2013-05-22: 1000 mg via ORAL
  Filled 2013-05-22: qty 2

## 2013-05-22 MED ORDER — VITAMIN D 1000 UNITS PO TABS
2000.0000 [IU] | ORAL_TABLET | Freq: Every morning | ORAL | Status: DC
  Administered 2013-05-23 – 2013-05-24 (×2): 2000 [IU] via ORAL
  Filled 2013-05-22 (×2): qty 2

## 2013-05-22 MED ORDER — SODIUM CHLORIDE 0.9 % IJ SOLN
3.0000 mL | Freq: Two times a day (BID) | INTRAMUSCULAR | Status: DC
  Administered 2013-05-22: 3 mL via INTRAVENOUS

## 2013-05-22 MED ORDER — ENOXAPARIN SODIUM 40 MG/0.4ML ~~LOC~~ SOLN
40.0000 mg | SUBCUTANEOUS | Status: DC
  Administered 2013-05-22 – 2013-05-23 (×2): 40 mg via SUBCUTANEOUS
  Filled 2013-05-22 (×3): qty 0.4

## 2013-05-22 MED ORDER — DEXTROSE 5 % IV SOLN
2.0000 g | Freq: Once | INTRAVENOUS | Status: AC
  Administered 2013-05-22: 2 g via INTRAVENOUS
  Filled 2013-05-22: qty 2

## 2013-05-22 MED ORDER — VITAMIN B-12 1000 MCG PO TABS
1000.0000 ug | ORAL_TABLET | Freq: Every day | ORAL | Status: DC
  Administered 2013-05-22 – 2013-05-24 (×3): 1000 ug via ORAL
  Filled 2013-05-22 (×3): qty 1

## 2013-05-22 MED ORDER — ACETAMINOPHEN 650 MG RE SUPP: 650.0000 mg | Freq: Four times a day (QID) | RECTAL | Status: DC | PRN

## 2013-05-22 MED ORDER — ONDANSETRON HCL 4 MG/2ML IJ SOLN: 4.0000 mg | Freq: Four times a day (QID) | INTRAMUSCULAR | Status: DC | PRN

## 2013-05-22 MED ORDER — LORAZEPAM 1 MG PO TABS
1.0000 mg | ORAL_TABLET | ORAL | Status: DC | PRN
  Administered 2013-05-22 – 2013-05-23 (×2): 1 mg via ORAL
  Filled 2013-05-22 (×2): qty 1

## 2013-05-22 MED ORDER — ACETAMINOPHEN 325 MG PO TABS
650.0000 mg | ORAL_TABLET | Freq: Four times a day (QID) | ORAL | Status: DC | PRN
  Administered 2013-05-22 – 2013-05-24 (×4): 650 mg via ORAL
  Filled 2013-05-22 (×4): qty 2

## 2013-05-22 MED ORDER — SODIUM CHLORIDE 0.9 % IV SOLN
1000.0000 mL | INTRAVENOUS | Status: DC
  Administered 2013-05-22: 1000 mL via INTRAVENOUS

## 2013-05-22 MED ORDER — SODIUM CHLORIDE 0.9 % IV SOLN
1000.0000 mL | Freq: Once | INTRAVENOUS | Status: AC
  Administered 2013-05-22: 1000 mL via INTRAVENOUS

## 2013-05-22 MED ORDER — LACTULOSE 10 GM/15ML PO SOLN
20.0000 g | ORAL | Status: DC | PRN
  Filled 2013-05-22: qty 30

## 2013-05-22 NOTE — ED Provider Notes (Signed)
CSN: [id number]     Arrival date & time 05/22/13  1314 History   First MD Initiated Contact with Patient 05/22/13 1331     Chief Complaint  Patient presents with  . Altered Mental Status   (Consider location/radiation/quality/duration/timing/severity/associated sxs/prior Treatment) HPI Comments: 77 year old female brought by EMS due to acute altered mental status. She was feeling well yesterday. She was normal and the daughter left for work and that she came back earlier this afternoon shows the patient was having her legs and was out of bed and seemed acutely confused. On my evaluation the patient's back to her normal baseline per the family. She also notes that she had a fever in the ER. Family has no knowledge of her having fevers prior to arrival. She's not had any dysuria or lower abdominal pain. She has had a little bit of a cough but denies dyspnea. Denies any headaches.   Past Medical History  Diagnosis Date  . Stroke   . Hypertension   . Hyperlipidemia   . Arthritis   . Depression   . Ulcer   . History of blood transfusion   . Urinary incontinence   . Allergy   . Skin cancer of nose   . Cellulitis of left leg   . Pre-diabetes    Past Surgical History  Procedure Laterality Date  . Tonsillectomy  1944  . Tubal ligation    . Bilateral oophorectomy  06/2012    ovarian cysts  . Skin cancer excision  2007    reomved from nose   Family History  Problem Relation Age of Onset  . Heart disease Other     Parent  . Cancer Maternal Uncle    History  Substance Use Topics  . Smoking status: Never Smoker   . Smokeless tobacco: Never Used  . Alcohol Use: No   OB History   Grav Para Term Preterm Abortions TAB SAB Ect Mult Living                 Review of Systems  Constitutional: Positive for fever.  Respiratory: Positive for cough. Negative for shortness of breath.   Cardiovascular: Negative for chest pain.  Gastrointestinal: Positive for vomiting (Once). Negative for  abdominal pain and diarrhea.  Genitourinary: Negative for dysuria.  Neurological: Negative for headaches.  Psychiatric/Behavioral: Positive for confusion (Transient).  All other systems reviewed and are negative.    Allergies  Codeine and Ether  Home Medications   Current Outpatient Rx  Name  Route  Sig  Dispense  Refill  . amitriptyline (ELAVIL) 25 MG tablet   Oral   Take 1 tablet (25 mg total) by mouth at bedtime.   30 tablet   5   . atorvastatin (LIPITOR) 80 MG tablet   Oral   Take 1 tablet (80 mg total) by mouth daily.   30 tablet   5   . Cholecalciferol (VITAMIN D) 1000 UNITS capsule   Oral   Take 1,000 Units by mouth daily.         . clopidogrel (PLAVIX) 75 MG tablet   Oral   Take 1 tablet (75 mg total) by mouth daily.   30 tablet   5   . diltiazem (CARDIZEM CD) 180 MG 24 hr capsule   Oral   Take 1 capsule (180 mg total) by mouth daily.   30 capsule   5   . docusate sodium (COLACE) 100 MG capsule   Oral   Take 100 mg by mouth  2 (two) times daily.         . ferrous sulfate 325 (65 FE) MG EC tablet   Oral   Take 325 mg by mouth daily.         Marland Kitchen gabapentin (NEURONTIN) 300 MG capsule      2 capsules by mouth in the morning , 1 capsule at noon and 2 capsule at bedtime.   150 capsule   5   . hydrochlorothiazide (MICROZIDE) 12.5 MG capsule   Oral   Take 1 capsule (12.5 mg total) by mouth daily.   30 capsule   0   . lidocaine (LIDODERM) 5 %   Transdermal   Place 1 patch onto the skin daily. Remove & Discard patch within 12 hours or as directed by MD   30 patch   2   . lisinopril (PRINIVIL,ZESTRIL) 5 MG tablet   Oral   Take 1 tablet (5 mg total) by mouth daily.   30 tablet   5   . LORazepam (ATIVAN) 1 MG tablet      TAKE ONE TABLET BY MOUTH EVERY 8 HOURS   90 tablet   0   . nitroGLYCERIN (NITROSTAT) 0.4 MG SL tablet   Sublingual   Place 0.4 mg under the tongue every 5 (five) minutes as needed for chest pain.         Marland Kitchen  oxybutynin (DITROPAN) 5 MG tablet   Oral   Take 1 tablet (5 mg total) by mouth 3 (three) times daily.   90 tablet   5   . phenytoin (DILANTIN) 100 MG ER capsule   Oral   Take 2 capsules (200 mg total) by mouth 2 (two) times daily.   120 capsule   5   . traZODone (DESYREL) 50 MG tablet   Oral   Take 3 tablets (150 mg total) by mouth at bedtime.   90 tablet   2   . vitamin B-12 (CYANOCOBALAMIN) 1000 MCG tablet   Oral   Take 1,000 mcg by mouth daily.          BP 117/56  Pulse 124  Temp(Src) 102.9 F (39.4 C) (Oral)  Resp 22  SpO2 92% Physical Exam  Nursing note and vitals reviewed. Constitutional: She is oriented to person, place, and time. She appears well-developed and well-nourished.  HENT:  Head: Normocephalic and atraumatic.  Right Ear: External ear normal.  Left Ear: External ear normal.  Nose: Nose normal.  Eyes: Right eye exhibits no discharge. Left eye exhibits no discharge.  Cardiovascular: Normal rate, regular rhythm and normal heart sounds.   Pulmonary/Chest: Effort normal and breath sounds normal.  Abdominal: Soft. There is tenderness in the suprapubic area.  Neurological: She is alert and oriented to person, place, and time. She has normal strength. No sensory deficit.  Skin: Skin is warm and dry.    ED Course  Procedures (including critical care time) Labs Review Labs Reviewed  CBC WITH DIFFERENTIAL - Abnormal; Notable for the following:    WBC 16.7 (*)    RBC 3.63 (*)    HCT 35.8 (*)    MCH 34.4 (*)    Neutrophils Relative % 94 (*)    Neutro Abs 15.6 (*)    Lymphocytes Relative 3 (*)    Lymphs Abs 0.4 (*)    All other components within normal limits  COMPREHENSIVE METABOLIC PANEL - Abnormal; Notable for the following:    Sodium 133 (*)    Glucose, Bld 122 (*)  Albumin 3.3 (*)    AST 38 (*)    ALT 44 (*)    GFR calc non Af Amer 51 (*)    GFR calc Af Amer 59 (*)    All other components within normal limits  URINALYSIS, ROUTINE W  REFLEX MICROSCOPIC - Abnormal; Notable for the following:    APPearance TURBID (*)    Hgb urine dipstick LARGE (*)    Protein, ur 30 (*)    Nitrite POSITIVE (*)    Leukocytes, UA LARGE (*)    All other components within normal limits  GLUCOSE, CAPILLARY - Abnormal; Notable for the following:    Glucose-Capillary 123 (*)    All other components within normal limits  URINE MICROSCOPIC-ADD ON - Abnormal; Notable for the following:    Bacteria, UA MANY (*)    All other components within normal limits  CULTURE, BLOOD (ROUTINE X 2)  CULTURE, BLOOD (ROUTINE X 2)  URINE CULTURE  LACTIC ACID, PLASMA   Imaging Review Dg Chest Port 1 View  05/22/2013   CLINICAL DATA:  Mental status changes.  EXAM: PORTABLE CHEST - 1 VIEW  COMPARISON:  None.  FINDINGS: The cardiac silhouette, mediastinal and hilar contours are within normal limits for age. There is streaky bibasilar atelectasis but no infiltrates, edema or effusions. The bony thorax is intact. Mild biapical pleural thickening.  IMPRESSION: No acute cardiopulmonary findings except for streaky bibasilar atelectasis.   Electronically Signed   By: Kalman Jewels M.D.   On: 05/22/2013 13:57    EKG Interpretation   None       MDM   1. Sepsis   2. UTI (urinary tract infection)    Patient's transient confusion likely related to her UTI. Given her fever and white blood cell count she qualifies sepsis. She will be given fluids, Rocephin, and cultures were obtained prior to antibiotics. Her blood pressure remained normal but did down trend even with fluids. Close blood pressure was in the high 90s. Still had normal mental status with this seem to be symptomatic. Due to this, however, will admit her to the step down for further sepsis care.    Ephraim Hamburger, MD 05/22/13 [phone]

## 2013-05-22 NOTE — H&P (Addendum)
[facility] History and Physical  Carol Page [id number] DOB: [birth year] DOA: 05/22/2013  Referring physician:  PCP: Webb Silversmith, NP  Specialists:   Chief Complaint: fever, confusion   HPI: Carol Page is a 77 y.o. female with PMH of HTN, HPL, seizure, depression, h/o CVA with residual memory loss presented with change in mental status; per family: she noted to have fever, chills, nausea, vomiting since morning late became confused without focal neuro weakness;  In ED she found to have UTI, hypotensive around 90's bolused two liter of NS .  -Admitted to SDU  Review of Systems: The patient denies anorexia, fever, weight loss,, vision loss, decreased hearing, hoarseness, chest pain, syncope, dyspnea on exertion, peripheral edema, balance deficits, hemoptysis, abdominal pain, melena, hematochezia, severe indigestion/heartburn, hematuria, incontinence, genital sores, suspicious skin lesions, transient blindness, difficulty walking, depression, unusual weight change, abnormal bleeding, enlarged lymph nodes, angioedema, and breast masses.    Past Medical History  Diagnosis Date  . Stroke   . Hypertension   . Hyperlipidemia   . Arthritis   . Depression   . Ulcer   . History of blood transfusion   . Urinary incontinence   . Allergy   . Skin cancer of nose   . Cellulitis of left leg   . Pre-diabetes    Past Surgical History  Procedure Laterality Date  . Tonsillectomy  1944  . Tubal ligation    . Bilateral oophorectomy  06/2012    ovarian cysts  . Skin cancer excision  2007    reomved from nose   Social History:  reports that she has never smoked. She has never used smokeless tobacco. She reports that she does not drink alcohol or use illicit drugs. Yes home:  where does patient live--home, ALF, SNF? and with whom if at home? Yes:  Can patient participate in ADLs?  Allergies  Allergen Reactions  . Codeine   . Ether     Family History  Problem Relation Age of  Onset  . Heart disease Other     Parent  . Cancer Maternal Uncle     (be sure to complete)  Prior to Admission medications   Medication Sig Start Date End Date Taking? Authorizing Provider  amitriptyline (ELAVIL) 25 MG tablet Take 1 tablet (25 mg total) by mouth at bedtime. 11/24/12   Webb Silversmith, NP  atorvastatin (LIPITOR) 80 MG tablet Take 1 tablet (80 mg total) by mouth daily. 12/08/12   Webb Silversmith, NP  Cholecalciferol (VITAMIN D) 1000 UNITS capsule Take 1,000 Units by mouth daily.    Historical Provider, MD  clopidogrel (PLAVIX) 75 MG tablet Take 1 tablet (75 mg total) by mouth daily. 01/02/13   Webb Silversmith, NP  diltiazem (CARDIZEM CD) 180 MG 24 hr capsule Take 1 capsule (180 mg total) by mouth daily. 12/08/12   Webb Silversmith, NP  docusate sodium (COLACE) 100 MG capsule Take 100 mg by mouth 2 (two) times daily.    Historical Provider, MD  ferrous sulfate 325 (65 FE) MG EC tablet Take 325 mg by mouth daily.    Historical Provider, MD  gabapentin (NEURONTIN) 300 MG capsule 2 capsules by mouth in the morning , 1 capsule at noon and 2 capsule at bedtime. 11/24/12   Biagio Borg, MD  hydrochlorothiazide (MICROZIDE) 12.5 MG capsule Take 1 capsule (12.5 mg total) by mouth daily. 05/16/13   Webb Silversmith, NP  lidocaine (LIDODERM) 5 % Place 1 patch onto the skin daily. Remove & Discard patch within 12  hours or as directed by MD 12/08/12   Webb Silversmith, NP  lisinopril (PRINIVIL,ZESTRIL) 5 MG tablet Take 1 tablet (5 mg total) by mouth daily. 11/24/12   Webb Silversmith, NP  LORazepam (ATIVAN) 1 MG tablet TAKE ONE TABLET BY MOUTH EVERY 8 HOURS 04/19/13   Webb Silversmith, NP  nitroGLYCERIN (NITROSTAT) 0.4 MG SL tablet Place 0.4 mg under the tongue every 5 (five) minutes as needed for chest pain.    Historical Provider, MD  oxybutynin (DITROPAN) 5 MG tablet Take 1 tablet (5 mg total) by mouth 3 (three) times daily. 11/24/12   Webb Silversmith, NP  phenytoin (DILANTIN) 100 MG ER capsule Take 2 capsules (200 mg total) by mouth  2 (two) times daily. 11/24/12   Webb Silversmith, NP  traZODone (DESYREL) 50 MG tablet Take 3 tablets (150 mg total) by mouth at bedtime. 12/08/12   Webb Silversmith, NP  vitamin B-12 (CYANOCOBALAMIN) 1000 MCG tablet Take 1,000 mcg by mouth daily.    Historical Provider, MD   Physical Exam: Filed Vitals:   05/22/13 1515  BP: 90/47  Pulse: 109  Temp:   Resp: 19     General:  alert  Eyes: EOM-i  ENT: no oral rash   Neck: supple, no JDV  Cardiovascular: S1,s2, RRR  Respiratory:  CTA BL   Abdomen: soft, nt, ND  Skin: no rash   Musculoskeletal: LE edema non pitting   Psychiatric: no hallucination   Neurologic: CN 2-12 intact, sensation intact, motor 5/5 symmetric;   Labs on Admission:  Basic Metabolic Panel:  Recent Labs Lab 05/22/13 1413  NA 133*  K 3.5  CL 97  CO2 23  GLUCOSE 122*  BUN 18  CREATININE 1.05  CALCIUM 8.7   Liver Function Tests:  Recent Labs Lab 05/22/13 1413  AST 38*  ALT 44*  ALKPHOS 101  BILITOT 0.5  PROT 6.5  ALBUMIN 3.3*   No results found for this basename: LIPASE, AMYLASE,  in the last 168 hours No results found for this basename: AMMONIA,  in the last 168 hours CBC:  Recent Labs Lab 05/22/13 1413  WBC 16.7*  NEUTROABS 15.6*  HGB 12.5  HCT 35.8*  MCV 98.6  PLT 169   Cardiac Enzymes: No results found for this basename: CKTOTAL, CKMB, CKMBINDEX, TROPONINI,  in the last 168 hours  BNP (last 3 results) No results found for this basename: PROBNP,  in the last 8760 hours CBG:  Recent Labs Lab 05/22/13 1341  GLUCAP 123*    Radiological Exams on Admission: Dg Chest Port 1 View  05/22/2013   CLINICAL DATA:  Mental status changes.  EXAM: PORTABLE CHEST - 1 VIEW  COMPARISON:  None.  FINDINGS: The cardiac silhouette, mediastinal and hilar contours are within normal limits for age. There is streaky bibasilar atelectasis but no infiltrates, edema or effusions. The bony thorax is intact. Mild biapical pleural thickening.  IMPRESSION:  No acute cardiopulmonary findings except for streaky bibasilar atelectasis.   Electronically Signed   By: Kalman Jewels M.D.   On: 05/22/2013 13:57    EKG: Independently reviewed. Pend   Assessment/Plan Principal Problem:   Sepsis Active Problems:   UTI (lower urinary tract infection)   Hypotension   77 y.o. female with PMH of HTN, HPL, seizure, depression, h/o CVA with residual memory loss presented with change in mental status found to have UTI, sepsis, hypotension   1. Acute encephalopathy due to sepsis, UTI;  -encephalopathy resolving, neuro exam non focal; cont treat UTI, monitor  2. Sepsis/SIRS/UTI; pend urine, blood c/s;  -cont IVF resuscitation; IV atx; monitor SDU overnight   3. HTN: currently hypotensive; hold BP meds (diuretics, cardizem, ACE)  4. H/o CVA with residual mild memory loss; neuro exam no new findings;  -cont plavix; pt eval;   5. Depression/anxiety; patient is on ativan, amitriptyline; will resume, monitor   Per family patient recently moved from [place]; able to walk with the walker at home, lives with her daughter; obtain PT eval;    Code Status: full (must indicate code status--if unknown or must be presumed, indicate so) Family Communication: daughter at the bedside (indicate person spoken with, if applicable, with phone number if by telephone) Disposition Plan: home in 24-48 hours  (indicate anticipated LOS)  Time spent: > 35 minutes   Kinnie Feil [facility] Pager [phone]  If 7PM-7AM, please contact night-coverage [email or web address] Password [email or web address] 05/22/2013, 3:36 PM

## 2013-05-22 NOTE — ED Notes (Signed)
Family called EMS for Pt Sx's of Altered mental status.

## 2013-05-23 DIAGNOSIS — I959 Hypotension, unspecified: Secondary | ICD-10-CM

## 2013-05-23 DIAGNOSIS — N390 Urinary tract infection, site not specified: Secondary | ICD-10-CM

## 2013-05-23 DIAGNOSIS — A419 Sepsis, unspecified organism: Secondary | ICD-10-CM | POA: Diagnosis not present

## 2013-05-23 LAB — BASIC METABOLIC PANEL
BUN: 17 mg/dL (ref 6–23)
CO2: 25 mEq/L (ref 19–32)
Calcium: 8 mg/dL — ABNORMAL LOW (ref 8.4–10.5)
Chloride: 106 mEq/L (ref 96–112)
Creatinine, Ser: 1.1 mg/dL (ref 0.50–1.10)
GFR calc Af Amer: 55 mL/min — ABNORMAL LOW (ref >90)
GFR calc non Af Amer: 48 mL/min — ABNORMAL LOW (ref >90)
Glucose, Bld: 110 mg/dL — ABNORMAL HIGH (ref 70–99)

## 2013-05-23 LAB — CBC
HCT: 30.6 % — ABNORMAL LOW (ref 36.0–46.0)
Hemoglobin: 10.6 g/dL — ABNORMAL LOW (ref 12.0–15.0)
MCH: 34.6 pg — ABNORMAL HIGH (ref 26.0–34.0)
MCHC: 34.6 g/dL (ref 30.0–36.0)
MCV: 100 fL (ref 78.0–100.0)
Platelets: 156 10*3/uL (ref 150–400)
RDW: 13.7 % (ref 11.5–15.5)
WBC: 14.3 10*3/uL — ABNORMAL HIGH (ref 4.0–10.5)

## 2013-05-23 LAB — GLUCOSE, CAPILLARY
Glucose-Capillary: 112 mg/dL — ABNORMAL HIGH (ref 70–99)
Glucose-Capillary: 101 mg/dL — ABNORMAL HIGH (ref 70–99)

## 2013-05-23 LAB — MRSA PCR SCREENING: MRSA by PCR: NEGATIVE

## 2013-05-23 NOTE — Progress Notes (Addendum)
Pt to TX to 6E-27, VSS, called report. Family present; aware of [place].

## 2013-05-23 NOTE — Progress Notes (Signed)
Pt admitted transferred to [address]. Pt is alert and oriented to staff, call bell, and room. Bed in lowest position. Call bell within reach. Full assessment to Epic. Will continue to monitor. Henry Russel, RN

## 2013-05-23 NOTE — Progress Notes (Signed)
[facility] Progress Note [place] TEAM 1 - Stepdown/ICU TEAM   Valeda Malm [id number] DOB: 03/06/1936 DOA: 05/22/2013 PCP: Webb Silversmith, NP  Brief narrative: Carol Page is a 77 y.o. female with PMH of HTN, HPL, seizure, depression, h/o CVA with residual memory loss presented with change in mental status; per family: she noted to have fever, chills, nausea, vomiting since morning late became confused without focal neuro weakness; In ED she found to have UTI, hypotensive around 90's bolused two liter of NS .    Assessment/Plan: Principal Problem:   Sepsis - due to UTI and improving   Active Problems:   UTI (lower urinary tract infection) - f/u cultures    Hypotension - sepsis associated- improved    Code Status: full  Family Communication: daughter Neoma Laming Disposition Plan: transfer to med/surg  Consultants: none  Procedures: None   Antibiotics: Ceftriaxone 11/3  DVT prophylaxis: Lovenox  HPI/Subjective: Pt feels much better, has a great appetite, wanting to get out of bed.    Objective: Blood pressure 146/55, pulse 87, temperature 99 F (37.2 C), temperature source Oral, resp. rate 18, height 5\' 3"  (1.6 m), weight 110.1 kg (242 lb 11.6 oz), SpO2 95.00%.  Intake/Output Summary (Last 24 hours) at 05/23/13 1407 Last data filed at 05/23/13 1300  Gross per 24 hour  Intake 2102.08 ml  Output   1300 ml  Net 802.08 ml     Exam: General: No acute respiratory distress Lungs: Clear to auscultation bilaterally without wheezes or crackles Cardiovascular: Regular rate and rhythm without murmur gallop or rub normal S1 and S2 Abdomen: Nontender, nondistended, soft, bowel sounds positive, no rebound, no ascites, no appreciable mass Extremities: No significant cyanosis, clubbing, or edema bilateral lower extremities  Data Reviewed: Basic Metabolic Panel:  Recent Labs Lab 05/22/13 1413 05/23/13 0455  NA 133* 138  K 3.5 3.9  CL 97 106  CO2 23  25  GLUCOSE 122* 110*  BUN 18 17  CREATININE 1.05 1.10  CALCIUM 8.7 8.0*   Liver Function Tests:  Recent Labs Lab 05/22/13 1413  AST 38*  ALT 44*  ALKPHOS 101  BILITOT 0.5  PROT 6.5  ALBUMIN 3.3*   No results found for this basename: LIPASE, AMYLASE,  in the last 168 hours No results found for this basename: AMMONIA,  in the last 168 hours CBC:  Recent Labs Lab 05/22/13 1413 05/23/13 0455  WBC 16.7* 14.3*  NEUTROABS 15.6*  --   HGB 12.5 10.6*  HCT 35.8* 30.6*  MCV 98.6 100.0  PLT 169 156   Cardiac Enzymes: No results found for this basename: CKTOTAL, CKMB, CKMBINDEX, TROPONINI,  in the last 168 hours BNP (last 3 results) No results found for this basename: PROBNP,  in the last 8760 hours CBG:  Recent Labs Lab 05/22/13 1341 05/22/13 1630 05/23/13 0807  GLUCAP 123* 135* 112*    Recent Results (from the past 240 hour(s))  URINE CULTURE     Status: None   Collection Time    05/22/13  1:44 PM      Result Value Range Status   Specimen Description URINE, CATHETERIZED   Final   Special Requests NONE   Final   Culture  Setup Time     Final   Value: 05/22/2013 16:26     Performed at [facility] PENDING   Incomplete   Culture     Final   Value: Culture reincubated for better growth     Performed  at [facility]   Report Status PENDING   Incomplete  CULTURE, BLOOD (ROUTINE X 2)     Status: None   Collection Time    05/22/13  2:15 PM      Result Value Range Status   Specimen Description BLOOD ARM LEFT   Final   Special Requests BOTTLES DRAWN AEROBIC AND ANAEROBIC 10CC   Final   Culture  Setup Time     Final   Value: 05/22/2013 22:46     Performed at [facility]   Culture     Final   Value:        BLOOD CULTURE RECEIVED NO GROWTH TO DATE CULTURE WILL BE HELD FOR 5 DAYS BEFORE ISSUING A FINAL NEGATIVE REPORT     Performed at [facility]   Report Status PENDING   Incomplete  CULTURE, BLOOD (ROUTINE X 2)      Status: None   Collection Time    05/22/13  2:20 PM      Result Value Range Status   Specimen Description BLOOD HAND LEFT   Final   Special Requests BOTTLES DRAWN AEROBIC ONLY 5CC   Final   Culture  Setup Time     Final   Value: 05/22/2013 22:45     Performed at [facility]   Culture     Final   Value:        BLOOD CULTURE RECEIVED NO GROWTH TO DATE CULTURE WILL BE HELD FOR 5 DAYS BEFORE ISSUING A FINAL NEGATIVE REPORT     Performed at [facility]   Report Status PENDING   Incomplete  MRSA PCR SCREENING     Status: None   Collection Time    05/23/13  6:10 AM      Result Value Range Status   MRSA by PCR NEGATIVE  NEGATIVE Final   Comment:            The GeneXpert MRSA Assay (FDA     approved for NASAL specimens     only), is one component of a     comprehensive MRSA colonization     surveillance program. It is not     intended to diagnose MRSA     infection nor to guide or     monitor treatment for     MRSA infections.     Studies:  Recent x-ray studies have been reviewed in detail by the Attending Physician  Scheduled Meds:  Scheduled Meds: . amitriptyline  25 mg Oral QHS  . atorvastatin  80 mg Oral Daily  . cefTRIAXone (ROCEPHIN)  IV  1 g Intravenous Q24H  . cholecalciferol  2,000 Units Oral q morning - 10a  . clopidogrel  75 mg Oral QPM  . diltiazem  180 mg Oral Daily  . docusate sodium  100 mg Oral BID  . enoxaparin (LOVENOX) injection  40 mg Subcutaneous Q24H  . ferrous sulfate  325 mg Oral Q breakfast  . gabapentin  600 mg Oral BID   And  . gabapentin  300 mg Oral Q1200  . phenytoin  200 mg Oral BID  . sodium chloride  3 mL Intravenous Q12H  . vitamin B-12  1,000 mcg Oral Daily   Continuous Infusions: . sodium chloride 125 mL/hr at 05/23/13 1253    Time spent on care of this patient: 56 min   Nyssa, MD  [facility] Office  [phone] Pager - Text Page per Shea Evans as per below:  On-Call/Text  Page:      [email or web address]       password [email or web address]  If 7PM-7AM, please contact night-coverage [email or web address] Password [email or web address] 05/23/2013, 2:07 PM   LOS: 1 day

## 2013-05-23 NOTE — Progress Notes (Signed)
Utilization review completed.  

## 2013-05-24 DIAGNOSIS — I959 Hypotension, unspecified: Secondary | ICD-10-CM | POA: Diagnosis not present

## 2013-05-24 DIAGNOSIS — E876 Hypokalemia: Secondary | ICD-10-CM | POA: Diagnosis not present

## 2013-05-24 DIAGNOSIS — A419 Sepsis, unspecified organism: Secondary | ICD-10-CM | POA: Diagnosis not present

## 2013-05-24 DIAGNOSIS — N390 Urinary tract infection, site not specified: Secondary | ICD-10-CM | POA: Diagnosis not present

## 2013-05-24 LAB — CBC
MCH: 33.9 pg (ref 26.0–34.0)
MCHC: 33.5 g/dL (ref 30.0–36.0)
MCV: 101.2 fL — ABNORMAL HIGH (ref 78.0–100.0)
Platelets: 156 10*3/uL (ref 150–400)
RBC: 3.3 MIL/uL — ABNORMAL LOW (ref 3.87–5.11)
RDW: 13.6 % (ref 11.5–15.5)
WBC: 11.8 10*3/uL — ABNORMAL HIGH (ref 4.0–10.5)

## 2013-05-24 LAB — BASIC METABOLIC PANEL
CO2: 24 mEq/L (ref 19–32)
Calcium: 8.2 mg/dL — ABNORMAL LOW (ref 8.4–10.5)
Creatinine, Ser: 0.84 mg/dL (ref 0.50–1.10)
GFR calc non Af Amer: 66 mL/min — ABNORMAL LOW (ref >90)
Glucose, Bld: 111 mg/dL — ABNORMAL HIGH (ref 70–99)
Potassium: 3.4 mEq/L — ABNORMAL LOW (ref 3.5–5.1)
Sodium: 139 mEq/L (ref 135–145)

## 2013-05-24 LAB — URINE CULTURE: Colony Count: >=100000

## 2013-05-24 MED ORDER — CIPROFLOXACIN HCL 500 MG PO TABS: 500.0000 mg | ORAL_TABLET | Freq: Two times a day (BID) | ORAL | Status: DC

## 2013-05-24 NOTE — Progress Notes (Signed)
Patient discharged to home. Patient AVS reviewed with patient and patient's daughter.  Patient and daughter verbalized understanding of medications and follow-up appointments.  Patient remains stable; no signs or symptoms of distress.  Patient educated to return to the ER in cases of SOB, dizziness, fever, chest pain, or fainting.

## 2013-05-24 NOTE — Evaluation (Signed)
Physical Therapy Evaluation Patient Details Name: Carol Page MRN: [id number] DOB: 03-09-1936 Today's Date: 05/24/2013 Time: [id number] PT Time Calculation (min): 21 min  PT Assessment / Plan / Recommendation History of Present Illness  Carol Page is a 77 y.o. female with PMH of HTN, HPL, seizure, depression, h/o CVA with residual memory loss presented with change in mental status; per family: she noted to have fever, chills, nausea, vomiting since morning late became confused without focal neuro weakness;  In ED she found to have UTI, hypotensive around 90's bolused two liter of NS .    Clinical Impression  Pt adm due to the above presents with deficits listed below (See PT problem list). Pt to benefit from skilled PT in acute setting to maximize functional independence with mobility and address deficits listed below. Pt is a fall risk and demo decr safety awareness with mobility. Pt would benefit from STSNF prior to returning home but pt is adamantly refusing at this time.   PT Assessment  Patient needs continued PT services    Follow Up Recommendations  Home health PT;Supervision/Assistance - 24 hour;Other (comment) (refusing SNF )    Does the patient have the potential to tolerate intense rehabilitation      Barriers to Discharge Other (comment) pt has been falling recurrently at home; unsure as to caregiver (A) present     Equipment Recommendations  3in1 (PT)    Recommendations for Other Services     Frequency Min 4X/week    Precautions / Restrictions Precautions Precautions: Fall Precaution Comments: pt reports multiple falls at home ~2-3 per month  Restrictions Weight Bearing Restrictions: No   Pertinent Vitals/Pain Pt c/o pain in Lt LE; RN present; stated pain has been consistent over the past year       Mobility  Bed Mobility Bed Mobility: Supine to Sit;Sitting - Scoot to Edge of Bed Supine to Sit: HOB elevated;5: Supervision;With rails Sitting - Scoot to Edge  of Bed: 5: Supervision;With rail Details for Bed Mobility Assistance: pt relied heavily on handrails; incr time required for bed mobility  Transfers Transfers: Sit to Stand;Stand to Sit Sit to Stand: 4: Min guard;From bed Stand to Sit: 4: Min guard;To chair/3-in-1;With armrests Details for Transfer Assistance: min guard to steady and cues for hand placement and safety with RW  Ambulation/Gait Ambulation/Gait Assistance: 4: Min assist Ambulation Distance (Feet): 12 Feet Assistive device: Rolling walker Ambulation/Gait Assistance Details: pt with lean to Rt; demo decreased safety awareness and knowledge of proper use of RW; cues for sequencing and safety with RW; min (A) to maintain balance and manage RW  Gait Pattern: Step-to pattern;Shuffle;Decreased stride length;Lateral trunk lean to right Gait velocity: decreased Stairs: No Wheelchair Mobility Wheelchair Mobility: No    Exercises General Exercises - Lower Extremity Ankle Circles/Pumps: AROM;5 reps;10 reps;Supine   PT Diagnosis: Abnormality of gait;Generalized weakness;Acute pain  PT Problem List: Decreased strength;Decreased activity tolerance;Decreased balance;Decreased mobility;Decreased knowledge of use of DME;Decreased safety awareness;Decreased cognition;Pain PT Treatment Interventions: DME instruction;Gait training;Functional mobility training;Therapeutic activities;Therapeutic exercise;Balance training;Neuromuscular re-education;Patient/family education;Stair training     PT Goals(Current goals can be found in the care plan section) Acute Rehab PT Goals Patient Stated Goal: to go home with my daughter PT Goal Formulation: With patient Time For Goal Achievement: 06/07/13 Potential to Achieve Goals: Good  Visit Information  Last PT Received On: 05/24/13 Assistance Needed: +1 History of Present Illness: Carol Page is a 77 y.o. female with PMH of HTN, HPL, seizure, depression, h/o CVA with residual memory loss  presented  with change in mental status; per family: she noted to have fever, chills, nausea, vomiting since morning late became confused without focal neuro weakness;  In ED she found to have UTI, hypotensive around 90's bolused two liter of NS .         Prior Carol Page expects to be discharged to:: Private residence Living Arrangements: Children Available Help at Discharge: Family Type of Home: House Home Access: Stairs to enter Technical brewer of Steps: 2 Home Layout: One [place]: Environmental consultant - 2 wheels;Shower seat Prior Function Level of Independence: Needs assistance Gait / Transfers Assistance Needed: reports daughter has to help her at times but will get up alone when she has to  ADL's / Homemaking Assistance Needed: pt's dtr takes care of cooking and home mgt. Pt's dtr also assists her with set up of UB ADLs and max A with LB ADLs Communication Communication: No difficulties Dominant Hand: Right    Cognition  Cognition Arousal/Alertness: Awake/alert Behavior During Therapy: Impulsive Area of Impairment: Safety/judgement;Awareness Safety/Judgement: Decreased awareness of safety    Extremity/Trunk Assessment Upper Extremity Assessment Upper Extremity Assessment: Defer to OT evaluation Lower Extremity Assessment Lower Extremity Assessment: LLE deficits/detail;Generalized weakness LLE Deficits / Details: Lt LE limited by pain in Lt knee/calf region  LLE: Unable to fully assess due to pain Cervical / Trunk Assessment Cervical / Trunk Assessment: Kyphotic   Balance Balance Balance Assessed: Yes Static Sitting Balance Static Sitting - Balance Support: Bilateral upper extremity supported;Feet supported Static Sitting - Level of Assistance: 5: Stand by assistance Static Sitting - Comment/# of Minutes: tolerated sitting EOB ~3 min prior to transfer   End of Session PT - End of Session Equipment Utilized During Treatment: Gait belt Activity  Tolerance: Patient tolerated treatment well Patient left: in chair;with call bell/phone within reach Nurse Communication: Mobility status  GP     Gustavus Bryant, [place] 05/24/2013, 12:00 PM

## 2013-05-24 NOTE — Evaluation (Signed)
Occupational Therapy Evaluation Patient Details Name: Carol Page MRN: [id number] DOB: 09-30-1935 Today's Date: 05/24/2013 Time: [id number] OT Time Calculation (min): 19 min  OT Assessment / Plan / Recommendation History of present illness Carol Page is a 77 y.o. female with PMH of HTN, HPL, seizure, depression, h/o CVA with residual memory loss presented with change in mental status; per family: she noted to have fever, chills, nausea, vomiting since morning late became confused without focal neuro weakness;  In ED she found to have UTI, hypotensive around 90's bolused two liter of NS .     Clinical Impression   Pt demos decline in function with ADLs and ADL mobility safety and would benefit from acute OT services to address impairments to help restore PLOF to return home safely    OT Assessment  Patient needs continued OT Services    Follow Up Recommendations  Home health OT;Supervision/Assistance - 24 hour;SNF(pt refusing SNF)   Barriers to Discharge   Uncertain if pt's dtr can provide current level of care and with pt having falls at home. Pt refusing [facility]  Equipment Recommendations  3 in 1 bedside comode    Recommendations for Other Services    Frequency  Min 2X/week    Precautions / Restrictions Precautions Precautions: Fall Precaution Comments: pt reports multiple falls at home ~2-3 per month  Restrictions Weight Bearing Restrictions: No   Pertinent Vitals/Pain 6/10 L LE    ADL  Grooming: Performed;Wash/dry hands;Wash/dry face;Min guard Where Assessed - Grooming: Supported standing Upper Body Bathing: Simulated;Minimal assistance Lower Body Bathing: Simulated;Maximal assistance Upper Body Dressing: Performed;Minimal assistance Lower Body Dressing: Maximal assistance;+1 Total assistance Toilet Transfer: Min guard;Simulated Toilet Transfer Method: Sit to stand Toileting - Clothing Manipulation and Hygiene: Minimal assistance Where Assessed -  Best boy and Hygiene: Standing Tub/Shower Transfer Method: Not assessed Equipment Used: Gait belt;Rolling walker Transfers/Ambulation Related to ADLs: min guard to steady and cues for hand placement and safety with RW  ADL Comments: Pt's dtr provides extensive assist for LB ADLs at home    OT Diagnosis: Generalized weakness  OT Problem List: Decreased strength;Decreased knowledge of use of DME or AE;Decreased activity tolerance;Pain;Impaired balance (sitting and/or standing);Decreased safety awareness OT Treatment Interventions: Self-care/ADL training;Balance training;Therapeutic exercise;Neuromuscular education;Therapeutic activities;DME and/or AE instruction;Patient/family education   OT Goals(Current goals can be found in the care plan section) Acute Rehab OT Goals Patient Stated Goal: to go home with my daughter OT Goal Formulation: With patient Time For Goal Achievement: 05/31/13 Potential to Achieve Goals: Good ADL Goals Pt Will Perform Grooming: with set-up;with supervision;standing Pt Will Perform Upper Body Bathing: with min guard assist;with supervision;with set-up;sitting;standing Pt Will Perform Lower Body Bathing: with mod assist Pt Will Perform Upper Body Dressing: with min guard assist;with supervision;with set-up;sitting;standing Pt Will Transfer to Toilet: with supervision;ambulating;regular height toilet;bedside commode;grab bars Pt Will Perform Toileting - Clothing Manipulation and hygiene: with min assist;sit to/from stand Pt Will Perform Tub/Shower Transfer: with min assist;with min guard assist;shower seat  Visit Information  Last OT Received On: 05/24/13 Assistance Needed: +1 PT/OT Co-Evaluation/Treatment: Yes History of Present Illness: Carol Page is a 77 y.o. female with PMH of HTN, HPL, seizure, depression, h/o CVA with residual memory loss presented with change in mental status; per family: she noted to have fever, chills, nausea,  vomiting since morning late became confused without focal neuro weakness;  In ED she found to have UTI, hypotensive around 90's bolused two liter of NS .  Prior [place] expects to be discharged to:: Private residence Living Arrangements: Children Available Help at Discharge: Family Type of Home: House Home Access: Stairs to enter Technical brewer of Steps: 2 Home Layout: One [place]: Environmental consultant - 2 wheels;Shower seat Prior Function Level of Independence: Needs assistance Gait / Transfers Assistance Needed: reports daughter has to help her at times but will get up alone when she has to  ADL's / Homemaking Assistance Needed: pt's dtr takes care of cooking and home mgt. Pt's dtr also assists her with set up of UB ADLs and mod - max A with LB ADLs Communication Communication: No difficulties Dominant Hand: Right         Vision/Perception Vision - History Baseline Vision: Wears glasses all the time Patient Visual Report: No change from baseline Perception Perception: Within Functional Limits   Cognition  Cognition Arousal/Alertness: Awake/alert Behavior During Therapy: Impulsive Area of Impairment: Safety/judgement;Awareness Safety/Judgement: Decreased awareness of safety    Extremity/Trunk Assessment Upper Extremity Assessment Upper Extremity Assessment: Overall WFL for tasks assessed;Generalized weakness Lower Extremity Assessment Lower Extremity Assessment: Defer to PT evaluation Cervical / Trunk Assessment Cervical / Trunk Assessment: Kyphotic     Mobility Bed Mobility Bed Mobility: Supine to Sit;Sitting - Scoot to Edge of Bed Supine to Sit: HOB elevated;5: Supervision;With rails Sitting - Scoot to Edge of Bed: 5: Supervision;With rail Details for Bed Mobility Assistance: pt relied heavily on handrails; incr time required for bed mobility  Transfers Sit to Stand: 4: Min guard;From bed Stand to Sit: 4: Min  guard;To chair/3-in-1;With armrests Details for Transfer Assistance: min guard to steady and cues for hand placement and safety with RW         Balance Balance Balance Assessed: Yes Static Sitting Balance Static Sitting - Balance Support: Bilateral upper extremity supported;Feet supported Static Sitting - Level of Assistance: 5: Stand by assistance Static Sitting - Comment/# of Minutes: tolerated sitting EOB ~3 min prior to transfer  Dynamic Standing Balance Dynamic Standing - Balance Support: Left upper extremity supported;During functional activity Dynamic Standing - Level of Assistance: 4: Min assist   End of Session OT - End of Session Equipment Utilized During Treatment: Gait belt;Rolling walker Activity Tolerance: Patient tolerated treatment well Patient left: in chair;with family/visitor present;with call bell/phone within reach Nurse Communication: Mobility status  GO     Britt Bottom 05/24/2013, 1:48 PM

## 2013-05-24 NOTE — Progress Notes (Signed)
Chaplain provided ministry of presence. Patient's daughter was present. Chaplain performed Eucharist at the patient's request. Chaplain confirmed with Nurse McKenzie that the patient would be okay to partake in [place].   05/24/13 1130  Clinical Encounter Type  Visited With Patient and family together  Visit Type Follow-up;Spiritual support  Spiritual Encounters  Spiritual Needs Prayer;Ritual;Other (Comment) Manufacturing engineer (Holy Communion))

## 2013-05-24 NOTE — Discharge Summary (Signed)
Physician Discharge Summary  Carol Page [id number] DOB: [birth year] DOA: 05/22/2013  PCP: Webb Silversmith, NP  Admit date: 05/22/2013 Discharge date: 05/24/2013  Time spent: 40 minutes  Recommendations for Outpatient Follow-up:  1. Discharge home with outpatient PCP follow up. Patient refused home health PT. 2. Please followup on urine culture sent on 11/5  Discharge Diagnoses:   Principal Problem:   Sepsis secondary to UTI.  Active Problems:   Unspecified essential hypertension   Severe obesity (BMI >= 40)   UTI (lower urinary tract infection)   Hypotension   Hypokalemia   Discharge Condition: Fair  Diet recommendation: Low-sodium  Filed Weights   05/22/13 2011 05/23/13 2050  Weight: 110.1 kg (242 lb 11.6 oz) 109.816 kg (242 lb 1.6 oz)    History of present illness:  [name], [age]-year-old female with history of hypertension, hyperlipidemia, seizure disorders, depression, [name], chills, nausea and vomiting on the day of admission. In the ED she was found to have UTI, hypertension with systolic blood pressure 0000000. She was given 2 L normal saline bolus and admitted to step down unit for closer monitoring.   Hospital Course:  Sepsis secondary to UTI Patient continued on IV fluids in the step down unit. Started on IV Rocephin empirically for UTI. Urine culture growing mixed bacteria. Her blood pressure has been stable. TMax of 100.5 in past 24 hrs. mental status back to baseline. A repeat urine culture has been ordered today. Patient is hemodynamically stable. I would discharge her on oral ciprofloxacin 500 mg twice daily to complete a seven-day course of antibiotic. Patient seen by physical therapy and recommended skilled nursing facility which she refused. Recommended home health PT weekly for supervision but both family and patient refused as she is not compliant with  physical therapy at home. She has a walker at home . She lives with her daughter who was provided full supervision at home.  He is stable for discharge home. I have instructed her to followup with her PCP in one week. Urine culture sent today needs to be followed up as outpatient.  Remaining medical issues are stable.  Procedures:  None  Consultations:  none  Discharge Exam: Filed Vitals:   05/24/13 0950  BP: 133/63  Pulse: 93  Temp: 97.8 F (36.6 C)  Resp: 18    General: Elderly obese female in no acute distress HEENT: No pallor, moist oral mucosa Chest: Clear to auscultation bilaterally, no added sounds CVS: Normal S1 and S2, no murmurs Abdomen: Soft, nontender, nondistended, bowel sounds present Extremities: Warm, no edema CNS: AAO x3, non focal   Discharge Instructions     Medication List         amitriptyline 25 MG tablet  Commonly known as:  ELAVIL  Take 1 tablet (25 mg total) by mouth at bedtime.     atorvastatin 80 MG tablet  Commonly known as:  LIPITOR  Take 1 tablet (80 mg total) by mouth daily.     Cholecalciferol 2000 UNITS Tabs  Take 2,000 Units by mouth every morning.     ciprofloxacin 500 MG tablet  Commonly known as:  CIPRO  Take 1 tablet (500 mg total) by mouth 2 (two) times daily.     clopidogrel 75 MG tablet  Commonly known as:  PLAVIX  Take 75 mg by mouth every evening.     diltiazem 180 MG 24 hr capsule  Commonly known as:  CARDIZEM CD  Take 1 capsule (180 mg total) by mouth daily.     diphenhydramine-acetaminophen 25-500 MG Tabs  Commonly known as:  TYLENOL PM  Take 1 tablet by mouth 2 (two) times daily.     docusate sodium 100 MG capsule  Commonly known as:  COLACE  Take 100 mg by mouth 2 (two) times daily.     ferrous sulfate 325 (65 FE) MG EC tablet  Take 325 mg by mouth daily with breakfast.     gabapentin 300 MG capsule  Commonly known as:  NEURONTIN  Take 300-600 mg by mouth 3 (three) times daily. Takes 600mg  in  morning, 300mg  at noon and 600mg  at bedtime     hydrochlorothiazide 12.5 MG capsule  Commonly known as:  MICROZIDE  Take 1 capsule (12.5 mg total) by mouth daily.     lactulose 10 GM/15ML solution  Commonly known as:  CHRONULAC  Take 20 g by mouth as needed (for constipation).     lisinopril 5 MG tablet  Commonly known as:  PRINIVIL,ZESTRIL  Take 1 tablet (5 mg total) by mouth daily.     LORazepam 1 MG tablet  Commonly known as:  ATIVAN  Take 1 mg by mouth daily.     nitroGLYCERIN 0.4 MG SL tablet  Commonly known as:  NITROSTAT  Place 0.4 mg under the tongue every 5 (five) minutes as needed for chest pain.     oxybutynin 5 MG tablet  Commonly known as:  DITROPAN  Take 1 tablet (5 mg total) by mouth 3 (three) times daily.     phenytoin 100 MG ER capsule  Commonly known as:  DILANTIN  Take 2 capsules (200 mg total) by mouth 2 (two) times daily.     SALONPAS 1.2-5.7-6.3 % Ptch  Generic drug:  Camphor-Menthol-Methyl Sal  Apply 1 patch topically daily.     traZODone 50 MG tablet  Commonly known as:  DESYREL  Take 3 tablets (150 mg total) by mouth at bedtime.     vitamin B-12 1000 MCG tablet  Commonly known as:  CYANOCOBALAMIN  Take 1,000 mcg by mouth daily.       Allergies  Allergen Reactions  . Codeine Nausea And Vomiting  . Ether Nausea And Vomiting       Follow-up Information   Follow up with Webb Silversmith, NP In 1 week. (please follow urine cx from 11/5)    Specialty:  Internal Medicine   Contact information:   [address] [place] [address] [phone]        The results of significant diagnostics from this hospitalization (including imaging, microbiology, ancillary and laboratory) are listed below for reference.    Significant Diagnostic Studies: Dg Chest Port 1 View  05/22/2013   CLINICAL DATA:  Mental status changes.  EXAM: PORTABLE CHEST - 1 VIEW  COMPARISON:  None.  FINDINGS: The cardiac silhouette, mediastinal and hilar contours are  within normal limits for age. There is streaky bibasilar atelectasis but no infiltrates, edema or effusions. The bony thorax is intact. Mild biapical pleural thickening.  IMPRESSION: No acute cardiopulmonary findings except for streaky bibasilar atelectasis.   Electronically Signed   By: Kalman Jewels M.D.   On: 05/22/2013 13:57    Microbiology: Recent Results (from the past 240 hour(s))  URINE CULTURE     Status: None   Collection Time    05/22/13  1:44 PM      Result Value Range Status   Specimen Description URINE, CATHETERIZED  Final   Special Requests NONE   Final   Culture  Setup Time     Final   Value: 05/22/2013 16:26     Performed at [facility]     Final   Value: >=100,000 COLONIES/ML     Performed at [facility]   Culture     Final   Value: Multiple bacterial morphotypes present, none predominant. Suggest appropriate recollection if clinically indicated.     Performed at [facility]   Report Status 05/24/2013 FINAL   Final  CULTURE, BLOOD (ROUTINE X 2)     Status: None   Collection Time    05/22/13  2:15 PM      Result Value Range Status   Specimen Description BLOOD ARM LEFT   Final   Special Requests BOTTLES DRAWN AEROBIC AND ANAEROBIC 10CC   Final   Culture  Setup Time     Final   Value: 05/22/2013 22:46     Performed at [facility]   Culture     Final   Value:        BLOOD CULTURE RECEIVED NO GROWTH TO DATE CULTURE WILL BE HELD FOR 5 DAYS BEFORE ISSUING A FINAL NEGATIVE REPORT     Performed at [facility]   Report Status PENDING   Incomplete  CULTURE, BLOOD (ROUTINE X 2)     Status: None   Collection Time    05/22/13  2:20 PM      Result Value Range Status   Specimen Description BLOOD HAND LEFT   Final   Special Requests BOTTLES DRAWN AEROBIC ONLY 5CC   Final   Culture  Setup Time     Final   Value: 05/22/2013 22:45     Performed at [facility]   Culture     Final   Value:        BLOOD  CULTURE RECEIVED NO GROWTH TO DATE CULTURE WILL BE HELD FOR 5 DAYS BEFORE ISSUING A FINAL NEGATIVE REPORT     Performed at [facility]   Report Status PENDING   Incomplete  MRSA PCR SCREENING     Status: None   Collection Time    05/23/13  6:10 AM      Result Value Range Status   MRSA by PCR NEGATIVE  NEGATIVE Final   Comment:            The GeneXpert MRSA Assay (FDA     approved for NASAL specimens     only), is one component of a     comprehensive MRSA colonization     surveillance program. It is not     intended to diagnose MRSA     infection nor to guide or     monitor treatment for     MRSA infections.     Labs: Basic Metabolic Panel:  Recent Labs Lab 05/22/13 1413 05/23/13 0455 05/24/13 0601  NA 133* 138 139  K 3.5 3.9 3.4*  CL 97 106 107  CO2 23 25 24   GLUCOSE 122* 110* 111*  BUN 18 17 10   CREATININE 1.05 1.10 0.84  CALCIUM 8.7 8.0* 8.2*   Liver Function Tests:  Recent Labs Lab 05/22/13 1413  AST 38*  ALT 44*  ALKPHOS 101  BILITOT 0.5  PROT 6.5  ALBUMIN 3.3*   No results found for this basename: LIPASE, AMYLASE,  in the last 168 hours No results found for this basename: AMMONIA,  in the last 168 hours CBC:  Recent Labs Lab 05/22/13 1413 05/23/13 0455 05/24/13 0601  WBC 16.7* 14.3* 11.8*  NEUTROABS 15.6*  --   --   HGB 12.5 10.6* 11.2*  HCT 35.8* 30.6* 33.4*  MCV 98.6 100.0 101.2*  PLT 169 156 156   Cardiac Enzymes: No results found for this basename: CKTOTAL, CKMB, CKMBINDEX, TROPONINI,  in the last 168 hours BNP: BNP (last 3 results) No results found for this basename: PROBNP,  in the last 8760 hours CBG:  Recent Labs Lab 05/22/13 1341 05/22/13 1630 05/23/13 0807 05/23/13 1715  GLUCAP 123* 135* 112* 101*       Signed:  Marshelle Bilger  [facility] 05/24/2013, 3:09 PM

## 2013-05-24 NOTE — Progress Notes (Signed)
   CARE MANAGEMENT NOTE 05/24/2013  Patient:  Carol Page, Carol Page   Account Number:  [id number]  Date Initiated:  05/23/2013  Documentation initiated by:  Marvetta Gibbons  Subjective/Objective Assessment:   Pt admitted with sepsis     Action/Plan:   PTA pt lived at home with daughter, has RW, w/c, BSC- and other needed DME at home- PT/OT evals ordered   Anticipated DC Date:  05/26/2013   Anticipated DC Plan:  [place]  CM consult      Choice offered to / List presented to:             Status of service:  Completed, signed off Medicare Important Message given?   (If response is "NO", the following Medicare IM given date fields will be blank) Date Medicare IM given:   Date Additional Medicare IM given:    Discharge Disposition:    Per UR Regulation:  Reviewed for med. necessity/level of care/duration of stay  If discussed at [place] of Stay Meetings, dates discussed:    Comments:  05/24/2013  [place], [place]  Met with patient and daughter to discuss discharge planning.  The patient lives with her daughter Carol Page. DME at home: RW, 3:1, tub transfer bench, wheelchair. Discussed home health PT upon discharge. The patient declined any home health services, she had PT in the past, her daughter is a Marine scientist and knows what to do. NCM to continue to follow for any discharge needs.

## 2013-05-24 NOTE — Progress Notes (Signed)
Chaplain provided pastoral care and support to the patient. Chaplain assisted patient in verbalizing stress and anxiety regarding hospitalization. Patient said she feels she "is a burden to her daughter because she is unable to do things for herself." Chaplain offered encouragement, provided an emphatic ear and listened supportively as the patient shared her feelings. Patient asked to partake in [place]. Chaplain will follow up.   05/24/13 1025  Clinical Encounter Type  Visited With Patient  Visit Type Initial;Spiritual support;Social support  Referral From Physician  Spiritual Encounters  Spiritual Needs Emotional  Stress Factors  Patient Stress Factors Family relationships;Health changes;Major life changes

## 2013-05-25 LAB — URINE CULTURE

## 2013-05-28 ENCOUNTER — Other Ambulatory Visit: Payer: Self-pay | Admitting: Internal Medicine

## 2013-05-28 LAB — CULTURE, BLOOD (ROUTINE X 2)
Culture: NO GROWTH
Culture: NO GROWTH

## 2013-05-29 ENCOUNTER — Other Ambulatory Visit: Payer: Self-pay | Admitting: *Deleted

## 2013-05-29 MED ORDER — LISINOPRIL 5 MG PO TABS: 5.0000 mg | ORAL_TABLET | Freq: Every day | ORAL | Status: DC

## 2013-05-29 MED ORDER — AMITRIPTYLINE HCL 25 MG PO TABS: 25.0000 mg | ORAL_TABLET | Freq: Every day | ORAL | Status: DC

## 2013-05-29 NOTE — Telephone Encounter (Signed)
Received faxed refill request from pharmacy. Last office visit 12/09/12. Is it okay to refill medication?

## 2013-05-30 ENCOUNTER — Telehealth: Payer: Self-pay | Admitting: *Deleted

## 2013-05-30 NOTE — Telephone Encounter (Signed)
Message from 11.7.14 @ 3:31pm Carol Page called requesting Urine culture results from hospital visit.  I spoke with Carol Page advised of results.

## 2013-06-01 ENCOUNTER — Other Ambulatory Visit: Payer: Self-pay | Admitting: *Deleted

## 2013-06-01 NOTE — Telephone Encounter (Signed)
Ok to refill 

## 2013-06-02 MED ORDER — TRAZODONE HCL 50 MG PO TABS: 150.0000 mg | ORAL_TABLET | Freq: Every day | ORAL | Status: DC

## 2013-06-13 ENCOUNTER — Other Ambulatory Visit: Payer: Self-pay | Admitting: *Deleted

## 2013-06-13 MED ORDER — PHENYTOIN SODIUM EXTENDED 100 MG PO CAPS: 200.0000 mg | ORAL_CAPSULE | Freq: Two times a day (BID) | ORAL | Status: DC

## 2013-06-13 MED ORDER — DILTIAZEM HCL ER COATED BEADS 180 MG PO CP24: 180.0000 mg | ORAL_CAPSULE | Freq: Every day | ORAL | Status: DC

## 2013-06-19 DIAGNOSIS — Z87898 Personal history of other specified conditions: Secondary | ICD-10-CM | POA: Diagnosis not present

## 2013-06-27 ENCOUNTER — Other Ambulatory Visit: Payer: Self-pay | Admitting: Internal Medicine

## 2013-06-27 NOTE — Telephone Encounter (Signed)
rx called into pharmacy

## 2013-06-27 NOTE — Telephone Encounter (Signed)
Ok to phone in [place]

## 2013-06-28 ENCOUNTER — Other Ambulatory Visit: Payer: Self-pay | Admitting: *Deleted

## 2013-06-28 MED ORDER — OXYBUTYNIN CHLORIDE 5 MG PO TABS: 5.0000 mg | ORAL_TABLET | Freq: Three times a day (TID) | ORAL | Status: DC

## 2013-07-04 ENCOUNTER — Telehealth: Payer: Self-pay

## 2013-07-04 NOTE — Telephone Encounter (Signed)
Ok to refill 

## 2013-07-04 NOTE — Telephone Encounter (Signed)
I missed that Dr Danise Mina refilled medication on 06/28/2013 so no refill needed at this time

## 2013-07-04 NOTE — Telephone Encounter (Signed)
Oxybutynin last filled 05/28/13--please advise

## 2013-08-01 ENCOUNTER — Other Ambulatory Visit: Payer: Self-pay | Admitting: Internal Medicine

## 2013-08-16 ENCOUNTER — Other Ambulatory Visit: Payer: Self-pay

## 2013-08-16 MED ORDER — ATORVASTATIN CALCIUM 80 MG PO TABS: 80.0000 mg | ORAL_TABLET | Freq: Every day | ORAL | Status: DC

## 2013-08-31 ENCOUNTER — Other Ambulatory Visit: Payer: Self-pay | Admitting: Internal Medicine

## 2013-08-31 ENCOUNTER — Other Ambulatory Visit: Payer: Self-pay | Admitting: Family Medicine

## 2013-09-06 ENCOUNTER — Other Ambulatory Visit: Payer: Self-pay

## 2013-09-06 MED ORDER — GABAPENTIN 300 MG PO CAPS: 300.0000 mg | ORAL_CAPSULE | Freq: Three times a day (TID) | ORAL | Status: DC

## 2013-09-06 NOTE — Telephone Encounter (Signed)
Last OV 5/14 with no upcoming appts scheduled and Rx is in historical and I do not see where you have ever filled it--please advise

## 2013-09-06 NOTE — Telephone Encounter (Signed)
What med?

## 2013-09-06 NOTE — Telephone Encounter (Signed)
The neurontin, i am sorry

## 2013-09-06 NOTE — Addendum Note (Signed)
Addended by: Lurlean Nanny on: 09/06/2013 01:58 PM   Modules accepted: Orders

## 2013-09-22 ENCOUNTER — Other Ambulatory Visit: Payer: Self-pay | Admitting: Internal Medicine

## 2013-10-02 ENCOUNTER — Telehealth: Payer: Self-pay | Admitting: *Deleted

## 2013-10-02 NOTE — Telephone Encounter (Signed)
What medications is the patient requesting, can she phone [facility] for 30 day supply, I have never seen the patient.

## 2013-10-02 NOTE — Telephone Encounter (Signed)
Pts daughter called requesting refills on all of her medications prior to new pt appoint on 4.20.15.  Last OV was 5.23.14.  Please advise

## 2013-10-03 ENCOUNTER — Other Ambulatory Visit: Payer: Self-pay | Admitting: *Deleted

## 2013-10-03 MED ORDER — ATORVASTATIN CALCIUM 80 MG PO TABS: 80.0000 mg | ORAL_TABLET | Freq: Every day | ORAL | Status: DC

## 2013-10-03 MED ORDER — TRAZODONE HCL 50 MG PO TABS: 150.0000 mg | ORAL_TABLET | Freq: Every day | ORAL | Status: DC

## 2013-10-03 MED ORDER — OXYBUTYNIN CHLORIDE 5 MG PO TABS: ORAL_TABLET | ORAL | Status: DC

## 2013-10-03 MED ORDER — DILTIAZEM HCL ER COATED BEADS 180 MG PO CP24: 180.0000 mg | ORAL_CAPSULE | Freq: Every day | ORAL | Status: DC

## 2013-10-03 MED ORDER — HYDROCHLOROTHIAZIDE 12.5 MG PO CAPS: ORAL_CAPSULE | ORAL | Status: DC

## 2013-10-03 MED ORDER — LISINOPRIL 5 MG PO TABS: 5.0000 mg | ORAL_TABLET | Freq: Every day | ORAL | Status: DC

## 2013-10-03 MED ORDER — AMITRIPTYLINE HCL 25 MG PO TABS: 25.0000 mg | ORAL_TABLET | Freq: Every day | ORAL | Status: DC

## 2013-10-03 MED ORDER — PHENYTOIN SODIUM EXTENDED 100 MG PO CAPS: 200.0000 mg | ORAL_CAPSULE | Freq: Two times a day (BID) | ORAL | Status: DC

## 2013-10-03 MED ORDER — GABAPENTIN 300 MG PO CAPS: 300.0000 mg | ORAL_CAPSULE | Freq: Three times a day (TID) | ORAL | Status: DC

## 2013-10-03 NOTE — Telephone Encounter (Signed)
Ok to give 30 day supply of all medications I have filled before

## 2013-10-05 ENCOUNTER — Ambulatory Visit (INDEPENDENT_AMBULATORY_CARE_PROVIDER_SITE_OTHER): Payer: Medicare Other | Admitting: Internal Medicine

## 2013-10-05 ENCOUNTER — Encounter: Payer: Self-pay | Admitting: Internal Medicine

## 2013-10-05 VITALS — BP 132/84 | HR 78 | Temp 98.2°F | Wt 238.5 lb

## 2013-10-05 DIAGNOSIS — F32A Depression, unspecified: Secondary | ICD-10-CM

## 2013-10-05 DIAGNOSIS — F341 Dysthymic disorder: Secondary | ICD-10-CM

## 2013-10-05 DIAGNOSIS — I639 Cerebral infarction, unspecified: Secondary | ICD-10-CM

## 2013-10-05 DIAGNOSIS — I6350 Cerebral infarction due to unspecified occlusion or stenosis of unspecified cerebral artery: Secondary | ICD-10-CM

## 2013-10-05 DIAGNOSIS — E785 Hyperlipidemia, unspecified: Secondary | ICD-10-CM | POA: Diagnosis not present

## 2013-10-05 DIAGNOSIS — R569 Unspecified convulsions: Secondary | ICD-10-CM

## 2013-10-05 DIAGNOSIS — R32 Unspecified urinary incontinence: Secondary | ICD-10-CM

## 2013-10-05 DIAGNOSIS — G8929 Other chronic pain: Secondary | ICD-10-CM

## 2013-10-05 DIAGNOSIS — M79606 Pain in leg, unspecified: Secondary | ICD-10-CM

## 2013-10-05 DIAGNOSIS — E669 Obesity, unspecified: Secondary | ICD-10-CM

## 2013-10-05 DIAGNOSIS — I10 Essential (primary) hypertension: Secondary | ICD-10-CM | POA: Diagnosis not present

## 2013-10-05 DIAGNOSIS — M79609 Pain in unspecified limb: Secondary | ICD-10-CM

## 2013-10-05 DIAGNOSIS — F419 Anxiety disorder, unspecified: Principal | ICD-10-CM

## 2013-10-05 DIAGNOSIS — F329 Major depressive disorder, single episode, unspecified: Secondary | ICD-10-CM

## 2013-10-05 LAB — BASIC METABOLIC PANEL
BUN: 15 mg/dL (ref 6–23)
CO2: 26 meq/L (ref 19–32)
CREATININE: 0.9 mg/dL (ref 0.4–1.2)
Calcium: 8.9 mg/dL (ref 8.4–10.5)
Chloride: 105 mEq/L (ref 96–112)
GFR: 63.07 mL/min (ref >60.00)
GLUCOSE: 88 mg/dL (ref 70–99)
Potassium: 4.3 mEq/L (ref 3.5–5.1)
Sodium: 141 mEq/L (ref 135–145)

## 2013-10-05 LAB — COMPREHENSIVE METABOLIC PANEL
ALBUMIN: 4.1 g/dL (ref 3.5–5.2)
ALT: 27 U/L (ref 0–35)
AST: 18 U/L (ref 0–37)
Alkaline Phosphatase: 70 U/L (ref 39–117)
BUN: 15 mg/dL (ref 6–23)
CALCIUM: 8.9 mg/dL (ref 8.4–10.5)
CHLORIDE: 105 meq/L (ref 96–112)
CO2: 26 meq/L (ref 19–32)
Creatinine, Ser: 0.9 mg/dL (ref 0.4–1.2)
GFR: 63.07 mL/min (ref >60.00)
GLUCOSE: 88 mg/dL (ref 70–99)
POTASSIUM: 4.3 meq/L (ref 3.5–5.1)
SODIUM: 141 meq/L (ref 135–145)
TOTAL PROTEIN: 6.9 g/dL (ref 6.0–8.3)
Total Bilirubin: 0.4 mg/dL (ref 0.3–1.2)

## 2013-10-05 LAB — CBC
HCT: 40.9 % (ref 36.0–46.0)
Hemoglobin: 13.8 g/dL (ref 12.0–15.0)
MCHC: 33.6 g/dL (ref 30.0–36.0)
MCV: 99.9 fl (ref 78.0–100.0)
PLATELETS: 189 10*3/uL (ref 150.0–400.0)
RBC: 4.09 Mil/uL (ref 3.87–5.11)
RDW: 13.8 % (ref 11.5–14.6)
WBC: 7.3 10*3/uL (ref 4.5–10.5)

## 2013-10-05 LAB — LIPID PANEL
CHOL/HDL RATIO: 3
Cholesterol: 148 mg/dL (ref 0–200)
HDL: 49.8 mg/dL (ref >39.00)
LDL CALC: 66 mg/dL (ref 0–99)
TRIGLYCERIDES: 162 mg/dL — AB (ref 0.0–149.0)
VLDL: 32.4 mg/dL (ref 0.0–40.0)

## 2013-10-05 MED ORDER — LISINOPRIL 5 MG PO TABS: 5.0000 mg | ORAL_TABLET | Freq: Every day | ORAL | Status: DC

## 2013-10-05 MED ORDER — AMITRIPTYLINE HCL 25 MG PO TABS: 25.0000 mg | ORAL_TABLET | Freq: Every day | ORAL | Status: DC

## 2013-10-05 MED ORDER — TRAZODONE HCL 50 MG PO TABS: 150.0000 mg | ORAL_TABLET | Freq: Every day | ORAL | Status: DC

## 2013-10-05 MED ORDER — CLOPIDOGREL BISULFATE 75 MG PO TABS: 75.0000 mg | ORAL_TABLET | Freq: Every evening | ORAL | Status: DC

## 2013-10-05 MED ORDER — DILTIAZEM HCL ER COATED BEADS 180 MG PO CP24: 180.0000 mg | ORAL_CAPSULE | Freq: Every day | ORAL | Status: DC

## 2013-10-05 MED ORDER — HYDROCHLOROTHIAZIDE 12.5 MG PO CAPS: ORAL_CAPSULE | ORAL | Status: DC

## 2013-10-05 MED ORDER — NITROGLYCERIN 0.4 MG SL SUBL: 0.4000 mg | SUBLINGUAL_TABLET | SUBLINGUAL | Status: DC | PRN

## 2013-10-05 MED ORDER — PHENYTOIN SODIUM EXTENDED 100 MG PO CAPS: 200.0000 mg | ORAL_CAPSULE | Freq: Two times a day (BID) | ORAL | Status: DC

## 2013-10-05 MED ORDER — OXYBUTYNIN CHLORIDE 5 MG PO TABS: ORAL_TABLET | ORAL | Status: DC

## 2013-10-05 MED ORDER — GABAPENTIN 300 MG PO CAPS: 300.0000 mg | ORAL_CAPSULE | Freq: Three times a day (TID) | ORAL | Status: DC

## 2013-10-05 MED ORDER — ATORVASTATIN CALCIUM 80 MG PO TABS: 80.0000 mg | ORAL_TABLET | Freq: Every day | ORAL | Status: DC

## 2013-10-05 NOTE — Progress Notes (Signed)
Pre visit review using our clinic review tool, if applicable. No additional management support is needed unless otherwise documented below in the visit note. 

## 2013-10-05 NOTE — Patient Instructions (Addendum)

## 2013-10-05 NOTE — Progress Notes (Signed)
Subjective:    Patient ID: Carol Page, female    DOB: [birth year], 78 y.o.   MRN: [id number]  HPI  Pt presents to the clinic today to follow up chronic medical conditions.  HTN: On cardizem, HCTZ and lisinopril. She is not taking her blood pressure at home. She denies emergent symptoms.  HLD: On lipitor. Last lipid profile reviewed. Denies myalgias or joint pain.  Seizures: No recent seizures on dilantin.  Stroke: On plavix. No residual effects.  Depression: On ativan. Feels slightly more depressed. Living with her daughter whom she does not get along with. Would like to discuss taking an antidepressant.  Leg pain: Chronic. Controlled with Neurontin. She is not very active. Wheels around in the wheelchair most of the time.  Insomnia: Sleeping well on elavil and trazadone.  Review of Systems   Past Medical History  Diagnosis Date  . Stroke   . Hypertension   . Hyperlipidemia   . Arthritis   . Depression   . Ulcer   . History of blood transfusion   . Urinary incontinence   . Allergy   . Skin cancer of nose   . Cellulitis of left leg   . Pre-diabetes     Current Outpatient Prescriptions  Medication Sig Dispense Refill  . amitriptyline (ELAVIL) 25 MG tablet Take 1 tablet (25 mg total) by mouth at bedtime.  30 tablet  0  . atorvastatin (LIPITOR) 80 MG tablet Take 1 tablet (80 mg total) by mouth daily.  30 tablet  0  . Camphor-Menthol-Methyl Sal (SALONPAS) 1.2-5.7-6.3 % PTCH Apply 1 patch topically daily.       . Cholecalciferol 2000 UNITS TABS Take 2,000 Units by mouth every morning.       . clopidogrel (PLAVIX) 75 MG tablet Take 75 mg by mouth every evening.      . diltiazem (CARDIZEM CD) 180 MG 24 hr capsule Take 1 capsule (180 mg total) by mouth daily.  30 capsule  0  . diphenhydramine-acetaminophen (TYLENOL PM) 25-500 MG TABS Take 1 tablet by mouth 2 (two) times daily.       Marland Kitchen docusate sodium (COLACE) 100 MG capsule Take 100 mg by mouth 2 (two) times daily.        . ferrous sulfate 325 (65 FE) MG EC tablet Take 325 mg by mouth daily with breakfast.       . gabapentin (NEURONTIN) 300 MG capsule Take 1-2 capsules (300-600 mg total) by mouth 3 (three) times daily. Takes 600mg  in morning, 300mg  at noon and 600mg  at bedtime  150 capsule  0  . hydrochlorothiazide (MICROZIDE) 12.5 MG capsule TAKE ONE CAPSULE BY MOUTH ONCE DAILY  30 capsule  0  . lactulose (CHRONULAC) 10 GM/15ML solution Take 20 g by mouth as needed (for constipation).      Marland Kitchen lisinopril (PRINIVIL,ZESTRIL) 5 MG tablet Take 1 tablet (5 mg total) by mouth daily.  30 tablet  0  . LORazepam (ATIVAN) 1 MG tablet TAKE ONE TABLET BY MOUTH EVERY 8 HOURS  90 tablet  0  . nitroGLYCERIN (NITROSTAT) 0.4 MG SL tablet Place 0.4 mg under the tongue every 5 (five) minutes as needed for chest pain.      Marland Kitchen oxybutynin (DITROPAN) 5 MG tablet TAKE ONE TABLET BY MOUTH THREE TIMES DAILY  90 tablet  0  . phenytoin (DILANTIN) 100 MG ER capsule Take 2 capsules (200 mg total) by mouth 2 (two) times daily.  120 capsule  0  . traZODone (DESYREL) 50  MG tablet Take 3 tablets (150 mg total) by mouth at bedtime.  90 tablet  0  . vitamin B-12 (CYANOCOBALAMIN) 1000 MCG tablet Take 1,000 mcg by mouth daily.       No current facility-administered medications for this visit.    Allergies  Allergen Reactions  . Codeine Nausea And Vomiting  . Ether Nausea And Vomiting    Family History  Problem Relation Age of Onset  . Heart disease Other     Parent  . Cancer Maternal Uncle     History   Social History  . Marital Status: Widowed    Spouse Name: N/A    Number of Children: 4  . Years of Education: 12   Occupational History  . Retired    Social History Main Topics  . Smoking status: Never Smoker   . Smokeless tobacco: Never Used  . Alcohol Use: No  . Drug Use: No  . Sexual Activity: No   Other Topics Concern  . Not on file   Social History Narrative   Regular exercise-no   Caffeine Use-yes      Constitutional: Pt reports fatigue. Denies fever, malaise,  headache or abrupt weight changes.  Respiratory: Denies difficulty breathing, shortness of breath, cough or sputum production.   Cardiovascular: Denies chest pain, chest tightness, palpitations or swelling in the hands or feet. Musculoskeletal: Pt reports bilateral leg pain. Denies decrease in range of motion, muscle pain or joint pain and swelling.  Skin: Denies redness, rashes, lesions or ulcercations.  Neurological: Denies dizziness, difficulty with memory, difficulty with speech or problems with balance and coordination.  Psych: Pt reports increased depression. Denies anxiety, SI/HI.  No other specific complaints in a complete review of systems (except as listed in HPI above).    Objective:   Physical Exam   BP 132/84  Pulse 78  Temp(Src) 98.2 F (36.8 C) (Oral)  Wt 238 lb 8 oz (108.183 kg)  SpO2 97% Wt Readings from Last 3 Encounters:  10/05/13 238 lb 8 oz (108.183 kg)  05/23/13 242 lb 1.6 oz (109.816 kg)  12/09/12 247 lb 8 oz (112.265 kg)    General: Appears her stated age,obese, chronically ill appearing in NAD. Cardiovascular: Normal rate and rhythm. S1,S2 noted.  No murmur, rubs or gallops noted. No JVD or BLE edema. No carotid bruits noted. Pulmonary/Chest: Normal effort and positive vesicular breath sounds. No respiratory distress. No wheezes, rales or ronchi noted.  Musculoskeletal: Normal range of motion. No signs of joint swelling. No difficulty with gait.  Psychiatric: Mood depressed and affect flat. Behavior is normal. Judgment and thought content normal.     BMET    Component Value Date/Time   NA 139 05/24/2013 0601   K 3.4* 05/24/2013 0601   CL 107 05/24/2013 0601   CO2 24 05/24/2013 0601   GLUCOSE 111* 05/24/2013 0601   BUN 10 05/24/2013 0601   CREATININE 0.84 05/24/2013 0601   CALCIUM 8.2* 05/24/2013 0601   GFRNONAA 66* 05/24/2013 0601   GFRAA 77* 05/24/2013 0601    Lipid Panel      Component Value Date/Time   CHOL 180 11/09/2012 1537   TRIG 110.0 11/09/2012 1537   HDL 53.50 11/09/2012 1537   CHOLHDL 3 11/09/2012 1537   VLDL 22.0 11/09/2012 1537   LDLCALC 105* 11/09/2012 1537    CBC    Component Value Date/Time   WBC 11.8* 05/24/2013 0601   RBC 3.30* 05/24/2013 0601   HGB 11.2* 05/24/2013 0601   HCT 33.4*  05/24/2013 0601   PLT 156 05/24/2013 0601   MCV 101.2* 05/24/2013 0601   MCH 33.9 05/24/2013 0601   MCHC 33.5 05/24/2013 0601   RDW 13.6 05/24/2013 0601   LYMPHSABS 0.4* 05/22/2013 1413   MONOABS 0.6 05/22/2013 1413   EOSABS 0.0 05/22/2013 1413   BASOSABS 0.0 05/22/2013 1413    Hgb A1C Lab Results  Component Value Date   HGBA1C 5.7 11/09/2012        Assessment & Plan:

## 2013-10-06 ENCOUNTER — Telehealth: Payer: Self-pay | Admitting: Internal Medicine

## 2013-10-06 DIAGNOSIS — G8929 Other chronic pain: Secondary | ICD-10-CM | POA: Insufficient documentation

## 2013-10-06 DIAGNOSIS — F419 Anxiety disorder, unspecified: Principal | ICD-10-CM

## 2013-10-06 DIAGNOSIS — M79606 Pain in leg, unspecified: Secondary | ICD-10-CM

## 2013-10-06 DIAGNOSIS — Z8673 Personal history of transient ischemic attack (TIA), and cerebral infarction without residual deficits: Secondary | ICD-10-CM | POA: Insufficient documentation

## 2013-10-06 DIAGNOSIS — F329 Major depressive disorder, single episode, unspecified: Secondary | ICD-10-CM | POA: Insufficient documentation

## 2013-10-06 DIAGNOSIS — F32A Depression, unspecified: Secondary | ICD-10-CM | POA: Insufficient documentation

## 2013-10-06 DIAGNOSIS — R32 Unspecified urinary incontinence: Secondary | ICD-10-CM | POA: Insufficient documentation

## 2013-10-06 MED ORDER — SERTRALINE HCL 50 MG PO TABS: 50.0000 mg | ORAL_TABLET | Freq: Every day | ORAL | Status: DC

## 2013-10-06 NOTE — Assessment & Plan Note (Signed)
No issues on Dilantin Will check LFT's today Continue medication for now Medication refilled today

## 2013-10-06 NOTE — Assessment & Plan Note (Signed)
No issues on Plavix Will continue Medication refilled today

## 2013-10-06 NOTE — Assessment & Plan Note (Signed)
Seems depressed more than usual due to current living situation Will continue xanax Will start zoloft Daughter often talks for the patient and berates her - I discussed with the daughter how I would like to see the patient alone at her next visit

## 2013-10-06 NOTE — Assessment & Plan Note (Signed)
Controlled on elavil and neurontin Not very active but likely due to depression

## 2013-10-06 NOTE — Assessment & Plan Note (Signed)
Lipids controlled on statin Will check lipid profile and LFTs today Continue current therapy at this time Medication refilled today

## 2013-10-06 NOTE — Assessment & Plan Note (Signed)
Encouraged her to try to be more active I think her depression is getting in the way of her being active and motivated at all

## 2013-10-06 NOTE — Assessment & Plan Note (Signed)
On ditropan Will continue for now Medication refilled today

## 2013-10-06 NOTE — Telephone Encounter (Signed)
Relevant patient education mailed to patient.  

## 2013-10-06 NOTE — Assessment & Plan Note (Signed)
Well controlled Will check CBC and CMET today Continue current medications at this time Meds refilled today

## 2013-11-06 ENCOUNTER — Encounter: Payer: Medicare Other | Admitting: Physician Assistant

## 2013-11-29 ENCOUNTER — Other Ambulatory Visit: Payer: Self-pay | Admitting: Internal Medicine

## 2013-11-30 NOTE — Telephone Encounter (Signed)
Rx last filled 12/14 last OV 10/05/13--please advise

## 2013-12-01 NOTE — Telephone Encounter (Signed)
Pt is on Lorazepam--not Xanax--please advise if okay for Lorazepam

## 2013-12-01 NOTE — Telephone Encounter (Signed)
Rx called in to pharmacy. 

## 2013-12-01 NOTE — Telephone Encounter (Signed)
Ok to phone in xanax 

## 2013-12-01 NOTE — Telephone Encounter (Signed)
Yes, phone in [place]

## 2013-12-05 ENCOUNTER — Other Ambulatory Visit: Payer: Self-pay | Admitting: *Deleted

## 2013-12-05 DIAGNOSIS — M79606 Pain in leg, unspecified: Secondary | ICD-10-CM

## 2013-12-05 DIAGNOSIS — F329 Major depressive disorder, single episode, unspecified: Secondary | ICD-10-CM

## 2013-12-05 DIAGNOSIS — G8929 Other chronic pain: Secondary | ICD-10-CM

## 2013-12-05 DIAGNOSIS — F419 Anxiety disorder, unspecified: Principal | ICD-10-CM

## 2013-12-05 DIAGNOSIS — I10 Essential (primary) hypertension: Secondary | ICD-10-CM

## 2013-12-05 DIAGNOSIS — F32A Depression, unspecified: Secondary | ICD-10-CM

## 2013-12-05 MED ORDER — AMITRIPTYLINE HCL 25 MG PO TABS: 25.0000 mg | ORAL_TABLET | Freq: Every day | ORAL | Status: DC

## 2013-12-05 MED ORDER — TRAZODONE HCL 50 MG PO TABS: 150.0000 mg | ORAL_TABLET | Freq: Every day | ORAL | Status: DC

## 2013-12-05 MED ORDER — HYDROCHLOROTHIAZIDE 12.5 MG PO CAPS: ORAL_CAPSULE | ORAL | Status: DC

## 2013-12-05 NOTE — Telephone Encounter (Signed)
Received faxed refill request from pharmacy. Last refill was 10/05/13,  last office visit 10/05/13. Is it okay refill medications?

## 2013-12-05 NOTE — Addendum Note (Signed)
Addended by: Lurlean Nanny on: 12/05/2013 02:19 PM   Modules accepted: Orders

## 2013-12-05 NOTE — Telephone Encounter (Signed)
Ok to fill the 3 requested meds

## 2013-12-06 ENCOUNTER — Other Ambulatory Visit: Payer: Self-pay

## 2013-12-06 DIAGNOSIS — R32 Unspecified urinary incontinence: Secondary | ICD-10-CM

## 2013-12-06 MED ORDER — OXYBUTYNIN CHLORIDE 5 MG PO TABS: ORAL_TABLET | ORAL | Status: DC

## 2013-12-14 ENCOUNTER — Other Ambulatory Visit: Payer: Self-pay

## 2013-12-14 DIAGNOSIS — I10 Essential (primary) hypertension: Secondary | ICD-10-CM

## 2013-12-14 MED ORDER — LISINOPRIL 5 MG PO TABS: 5.0000 mg | ORAL_TABLET | Freq: Every day | ORAL | Status: DC

## 2014-01-08 ENCOUNTER — Other Ambulatory Visit: Payer: Self-pay

## 2014-01-08 ENCOUNTER — Other Ambulatory Visit: Payer: Self-pay | Admitting: *Deleted

## 2014-01-08 MED ORDER — DILTIAZEM HCL ER COATED BEADS 180 MG PO CP24: 180.0000 mg | ORAL_CAPSULE | Freq: Every day | ORAL | Status: DC

## 2014-01-08 MED ORDER — GABAPENTIN 300 MG PO CAPS: 300.0000 mg | ORAL_CAPSULE | Freq: Three times a day (TID) | ORAL | Status: DC

## 2014-01-08 NOTE — Telephone Encounter (Signed)
Last OV and Rx last filled 10/05/13--please advise if okay to refill

## 2014-01-22 ENCOUNTER — Other Ambulatory Visit: Payer: Self-pay

## 2014-01-22 DIAGNOSIS — I639 Cerebral infarction, unspecified: Secondary | ICD-10-CM

## 2014-01-22 DIAGNOSIS — R32 Unspecified urinary incontinence: Secondary | ICD-10-CM

## 2014-01-22 MED ORDER — OXYBUTYNIN CHLORIDE 5 MG PO TABS: ORAL_TABLET | ORAL | Status: DC

## 2014-01-22 MED ORDER — AMITRIPTYLINE HCL 25 MG PO TABS: 25.0000 mg | ORAL_TABLET | Freq: Every day | ORAL | Status: DC

## 2014-01-22 MED ORDER — CLOPIDOGREL BISULFATE 75 MG PO TABS: 75.0000 mg | ORAL_TABLET | Freq: Every evening | ORAL | Status: DC

## 2014-01-22 NOTE — Addendum Note (Signed)
Addended by: Lurlean Nanny on: 01/22/2014 01:07 PM   Modules accepted: Orders

## 2014-02-14 ENCOUNTER — Other Ambulatory Visit: Payer: Self-pay

## 2014-02-14 DIAGNOSIS — F32A Depression, unspecified: Secondary | ICD-10-CM

## 2014-02-14 DIAGNOSIS — F329 Major depressive disorder, single episode, unspecified: Secondary | ICD-10-CM

## 2014-02-14 DIAGNOSIS — F419 Anxiety disorder, unspecified: Principal | ICD-10-CM

## 2014-02-14 MED ORDER — SERTRALINE HCL 50 MG PO TABS: 50.0000 mg | ORAL_TABLET | Freq: Every day | ORAL | Status: DC

## 2014-02-14 NOTE — Telephone Encounter (Signed)
Last filled 10/06/13 with 3 refills--pt has upcoming appt 03/2014--please advise if okay to refill

## 2014-03-09 ENCOUNTER — Other Ambulatory Visit: Payer: Self-pay

## 2014-03-09 DIAGNOSIS — E785 Hyperlipidemia, unspecified: Secondary | ICD-10-CM

## 2014-03-09 DIAGNOSIS — R569 Unspecified convulsions: Secondary | ICD-10-CM

## 2014-03-09 MED ORDER — LORAZEPAM 1 MG PO TABS: ORAL_TABLET | ORAL | Status: DC

## 2014-03-09 MED ORDER — GABAPENTIN 300 MG PO CAPS: 300.0000 mg | ORAL_CAPSULE | Freq: Three times a day (TID) | ORAL | Status: DC

## 2014-03-09 MED ORDER — PHENYTOIN SODIUM EXTENDED 100 MG PO CAPS: 200.0000 mg | ORAL_CAPSULE | Freq: Two times a day (BID) | ORAL | Status: DC

## 2014-03-09 MED ORDER — ATORVASTATIN CALCIUM 80 MG PO TABS: 80.0000 mg | ORAL_TABLET | Freq: Every day | ORAL | Status: DC

## 2014-03-09 NOTE — Telephone Encounter (Signed)
[name] left v/m requesting refill for lorazepam,dilantin,gabapentin and lipitor ( lipitor sent to [facility]). [name] thought last week at pharmacy.Please advise.

## 2014-03-09 NOTE — Telephone Encounter (Signed)
Rx called in to pharmacy. 

## 2014-03-09 NOTE — Telephone Encounter (Signed)
Ok to phone in [place] to pharmacy. Others sent electronically

## 2014-03-22 ENCOUNTER — Other Ambulatory Visit: Payer: Self-pay

## 2014-03-22 DIAGNOSIS — F329 Major depressive disorder, single episode, unspecified: Secondary | ICD-10-CM

## 2014-03-22 DIAGNOSIS — F419 Anxiety disorder, unspecified: Principal | ICD-10-CM

## 2014-03-22 DIAGNOSIS — F32A Depression, unspecified: Secondary | ICD-10-CM

## 2014-03-22 MED ORDER — TRAZODONE HCL 50 MG PO TABS: 150.0000 mg | ORAL_TABLET | Freq: Every day | ORAL | Status: DC

## 2014-03-22 NOTE — Telephone Encounter (Signed)
Last filled 12/14/13--last OV 09/2013--please advise

## 2014-04-09 ENCOUNTER — Ambulatory Visit (INDEPENDENT_AMBULATORY_CARE_PROVIDER_SITE_OTHER): Payer: Medicare Other | Admitting: Internal Medicine

## 2014-04-09 ENCOUNTER — Encounter: Payer: Self-pay | Admitting: Internal Medicine

## 2014-04-09 VITALS — BP 144/68 | HR 78 | Temp 98.7°F | Wt 216.0 lb

## 2014-04-09 DIAGNOSIS — F419 Anxiety disorder, unspecified: Principal | ICD-10-CM

## 2014-04-09 DIAGNOSIS — I6350 Cerebral infarction due to unspecified occlusion or stenosis of unspecified cerebral artery: Secondary | ICD-10-CM | POA: Diagnosis not present

## 2014-04-09 DIAGNOSIS — R569 Unspecified convulsions: Secondary | ICD-10-CM

## 2014-04-09 DIAGNOSIS — M79609 Pain in unspecified limb: Secondary | ICD-10-CM

## 2014-04-09 DIAGNOSIS — R32 Unspecified urinary incontinence: Secondary | ICD-10-CM

## 2014-04-09 DIAGNOSIS — I10 Essential (primary) hypertension: Secondary | ICD-10-CM | POA: Diagnosis not present

## 2014-04-09 DIAGNOSIS — R7303 Prediabetes: Secondary | ICD-10-CM

## 2014-04-09 DIAGNOSIS — M79606 Pain in leg, unspecified: Secondary | ICD-10-CM

## 2014-04-09 DIAGNOSIS — Z23 Encounter for immunization: Secondary | ICD-10-CM

## 2014-04-09 DIAGNOSIS — F329 Major depressive disorder, single episode, unspecified: Secondary | ICD-10-CM

## 2014-04-09 DIAGNOSIS — F32A Depression, unspecified: Secondary | ICD-10-CM

## 2014-04-09 DIAGNOSIS — E785 Hyperlipidemia, unspecified: Secondary | ICD-10-CM

## 2014-04-09 DIAGNOSIS — R7309 Other abnormal glucose: Secondary | ICD-10-CM

## 2014-04-09 DIAGNOSIS — F341 Dysthymic disorder: Secondary | ICD-10-CM | POA: Diagnosis not present

## 2014-04-09 DIAGNOSIS — G8929 Other chronic pain: Secondary | ICD-10-CM

## 2014-04-09 DIAGNOSIS — I639 Cerebral infarction, unspecified: Secondary | ICD-10-CM

## 2014-04-09 MED ORDER — TRAZODONE HCL 50 MG PO TABS: 150.0000 mg | ORAL_TABLET | Freq: Every day | ORAL | Status: DC

## 2014-04-09 MED ORDER — OXYBUTYNIN CHLORIDE 5 MG PO TABS: ORAL_TABLET | ORAL | Status: DC

## 2014-04-09 MED ORDER — GABAPENTIN 300 MG PO CAPS: 900.0000 mg | ORAL_CAPSULE | Freq: Three times a day (TID) | ORAL | Status: DC

## 2014-04-09 MED ORDER — PHENYTOIN SODIUM EXTENDED 100 MG PO CAPS: 200.0000 mg | ORAL_CAPSULE | Freq: Two times a day (BID) | ORAL | Status: DC

## 2014-04-09 NOTE — Assessment & Plan Note (Signed)
Worse Try increase neurontin to 900 mg ID Continue amitriptyline

## 2014-04-09 NOTE — Assessment & Plan Note (Signed)
On lipitor Last lipid profile reviewed

## 2014-04-09 NOTE — Assessment & Plan Note (Signed)
No recent seizures on dilantin Continue for now

## 2014-04-09 NOTE — Assessment & Plan Note (Signed)
Stable Will wean off zoloft Continue ativan and trazadone

## 2014-04-09 NOTE — Progress Notes (Signed)
Subjective:    Patient ID: Carol Page, female    DOB: 26-Feb-1936, 78 y.o.   MRN: [id number]  HPI  Pt presents to the clinic for 6 month follow up.  HTN: Well controlled on cardizem, HCTZ and Lisinopril.  HLD: Denies myalgias on lipitor. Total cholesterol 148, HDL49, LDL 66  Obesity: Has lost 22 lbs over the last 6 months. Her daughter reports that her appetite has decreased. She does not eat lunch. She does eat a late breakfast. Does snack during the day.  Pre-Diabetes: Diet controlled. Last A1C 5.7 10/2012  Seizures: No recent seizures on Dilantin.  Anxiety and Depression: Stable on Zoloft, Ativan and Trazadone at night for sleep. Her daughter reports that the zoloft has not helped and would like to try stopping it.  Stroke: No current issues on Plavix.  Chronic leg pain: On Amitriptyline and Neurontin. Does continue to have pain.  Review of Systems      Past Medical History  Diagnosis Date  . Stroke   . Hypertension   . Hyperlipidemia   . Arthritis   . Depression   . Ulcer   . History of blood transfusion   . Urinary incontinence   . Allergy   . Skin cancer of nose   . Cellulitis of left leg   . Pre-diabetes     Current Outpatient Prescriptions  Medication Sig Dispense Refill  . amitriptyline (ELAVIL) 25 MG tablet Take 1 tablet (25 mg total) by mouth at bedtime.  30 tablet  2  . atorvastatin (LIPITOR) 80 MG tablet Take 1 tablet (80 mg total) by mouth daily.  90 tablet  1  . bisacodyl (DULCOLAX) 5 MG EC tablet Take 5 mg by mouth daily as needed for moderate constipation.      . Cholecalciferol 2000 UNITS TABS Take 2,000 Units by mouth every morning.       . clopidogrel (PLAVIX) 75 MG tablet Take 1 tablet (75 mg total) by mouth every evening.  30 tablet  2  . diltiazem (CARDIZEM CD) 180 MG 24 hr capsule Take 1 capsule (180 mg total) by mouth daily.  30 capsule  5  . diphenhydramine-acetaminophen (TYLENOL PM) 25-500 MG TABS Take 1 tablet by mouth 2 (two) times  daily.       Marland Kitchen docusate sodium (COLACE) 100 MG capsule Take 100 mg by mouth 2 (two) times daily.      . ferrous sulfate 325 (65 FE) MG EC tablet Take 325 mg by mouth daily with breakfast.       . gabapentin (NEURONTIN) 300 MG capsule Take 3 capsules (900 mg total) by mouth 3 (three) times daily. Takes 600mg  in morning, 300mg  at noon and 600mg  at bedtime  90 capsule  5  . hydrochlorothiazide (MICROZIDE) 12.5 MG capsule TAKE ONE CAPSULE BY MOUTH ONCE DAILY  30 capsule  5  . lisinopril (PRINIVIL,ZESTRIL) 5 MG tablet Take 1 tablet (5 mg total) by mouth daily.  30 tablet  5  . LORazepam (ATIVAN) 1 MG tablet Take 1 mg by mouth daily as needed. TAKE ONE TABLET BY MOUTH EVERY 8 HOURS      . nitroGLYCERIN (NITROSTAT) 0.4 MG SL tablet Place 1 tablet (0.4 mg total) under the tongue every 5 (five) minutes as needed for chest pain.  30 tablet  0  . oxybutynin (DITROPAN) 5 MG tablet TAKE ONE TABLET BY MOUTH THREE TIMES DAILY  90 tablet  5  . phenytoin (DILANTIN) 100 MG ER capsule Take 2 capsules (  200 mg total) by mouth 2 (two) times daily.  120 capsule  5  . sertraline (ZOLOFT) 50 MG tablet Take 1 tablet (50 mg total) by mouth daily.  30 tablet  3  . traZODone (DESYREL) 50 MG tablet Take 3 tablets (150 mg total) by mouth at bedtime.  90 tablet  5  . vitamin B-12 (CYANOCOBALAMIN) 1000 MCG tablet Take 1,000 mcg by mouth daily.       No current facility-administered medications for this visit.    Allergies  Allergen Reactions  . Codeine Nausea And Vomiting  . Ether Nausea And Vomiting    Family History  Problem Relation Age of Onset  . Heart disease Other     Parent  . Cancer Maternal Uncle     History   Social History  . Marital Status: Widowed    Spouse Name: N/A    Number of Children: 4  . Years of Education: 12   Occupational History  . Retired    Social History Main Topics  . Smoking status: Never Smoker   . Smokeless tobacco: Never Used  . Alcohol Use: No  . Drug Use: No  . Sexual  Activity: No   Other Topics Concern  . Not on file   Social History Narrative   Regular exercise-no   Caffeine Use-yes     Constitutional: Denies fever, malaise, fatigue, headache or abrupt weight changes.  Respiratory: Denies difficulty breathing, shortness of breath, cough or sputum production.   Cardiovascular: Denies chest pain, chest tightness, palpitations or swelling in the hands or feet.  Gastrointestinal: Denies abdominal pain, bloating, constipation, diarrhea or blood in the stool.  GU: Denies urgency, frequency, pain with urination, burning sensation, blood in urine, odor or discharge. Musculoskeletal: Pt reports leg pain. Denies decrease in range of motion, difficulty with gait, muscle pain.  Skin: Denies redness, rashes, lesions or ulcercations.  Neurological: Denies dizziness, difficulty with memory, difficulty with speech or problems with balance and coordination.   No other specific complaints in a complete review of systems (except as listed in HPI above).  Objective:   Physical Exam  BP 144/68  Pulse 78  Temp(Src) 98.7 F (37.1 C) (Oral)  Wt 216 lb (97.977 kg)  SpO2 95% Wt Readings from Last 3 Encounters:  04/09/14 216 lb (97.977 kg)  10/05/13 238 lb 8 oz (108.183 kg)  05/23/13 242 lb 1.6 oz (109.816 kg)    General: Appears her stated age, obese but well developed, well nourished in NAD. Skin: Warm, dry and intact. No rashes, lesions or ulcerations noted. Cardiovascular: Normal rate and rhythm. S1,S2 noted.  No murmur, rubs or gallops noted. No JVD or BLE edema. No carotid bruits noted. Pulmonary/Chest: Normal effort and positive vesicular breath sounds. No respiratory distress. No wheezes, rales or ronchi noted.  Abdomen: Soft and nontender. Normal bowel sounds, no bruits noted. No distention or masses noted. Liver, spleen and kidneys non palpable. Neurological: Alert and oriented. Marland Kitchen Psychiatric: Mood and affect flat. Behavior is normal. Judgment and  thought content normal.     BMET    Component Value Date/Time   NA 141 10/05/2013 1136   NA 141 10/05/2013 1136   K 4.3 10/05/2013 1136   K 4.3 10/05/2013 1136   CL 105 10/05/2013 1136   CL 105 10/05/2013 1136   CO2 26 10/05/2013 1136   CO2 26 10/05/2013 1136   GLUCOSE 88 10/05/2013 1136   GLUCOSE 88 10/05/2013 1136   BUN 15 10/05/2013 1136  BUN 15 10/05/2013 1136   CREATININE 0.9 10/05/2013 1136   CREATININE 0.9 10/05/2013 1136   CALCIUM 8.9 10/05/2013 1136   CALCIUM 8.9 10/05/2013 1136   GFRNONAA 66* 05/24/2013 0601   GFRAA 77* 05/24/2013 0601    Lipid Panel     Component Value Date/Time   CHOL 148 10/05/2013 1136   TRIG 162.0* 10/05/2013 1136   HDL 49.80 10/05/2013 1136   CHOLHDL 3 10/05/2013 1136   VLDL 32.4 10/05/2013 1136   LDLCALC 66 10/05/2013 1136    CBC    Component Value Date/Time   WBC 7.3 10/05/2013 1136   RBC 4.09 10/05/2013 1136   HGB 13.8 10/05/2013 1136   HCT 40.9 10/05/2013 1136   PLT 189.0 10/05/2013 1136   MCV 99.9 10/05/2013 1136   MCH 33.9 05/24/2013 0601   MCHC 33.6 10/05/2013 1136   RDW 13.8 10/05/2013 1136   LYMPHSABS 0.4* 05/22/2013 1413   MONOABS 0.6 05/22/2013 1413   EOSABS 0.0 05/22/2013 1413   BASOSABS 0.0 05/22/2013 1413    Hgb A1C Lab Results  Component Value Date   HGBA1C 5.7 11/09/2012         Assessment & Plan:   Health Maintenance:  Flu and Prevnar today  RTC in 6 months for followup

## 2014-04-09 NOTE — Assessment & Plan Note (Signed)
Continue ditropan

## 2014-04-09 NOTE — Addendum Note (Signed)
Addended by: Lurlean Nanny on: 04/09/2014 04:56 PM   Modules accepted: Orders

## 2014-04-09 NOTE — Assessment & Plan Note (Signed)
Has lost weight over the last 6 months Will continue to monitor

## 2014-04-09 NOTE — Patient Instructions (Addendum)

## 2014-04-09 NOTE — Progress Notes (Signed)
Pre visit review using our clinic review tool, if applicable. No additional management support is needed unless otherwise documented below in the visit note. 

## 2014-04-09 NOTE — Assessment & Plan Note (Signed)
Well controlled Continue Cardizem, HCTZ and Lisinopril for now

## 2014-04-09 NOTE — Assessment & Plan Note (Signed)
No current issues Continue plavix

## 2014-04-09 NOTE — Assessment & Plan Note (Signed)
Stable Diet controlled 

## 2014-04-16 ENCOUNTER — Emergency Department (HOSPITAL_COMMUNITY): Payer: Medicare Other

## 2014-04-16 ENCOUNTER — Other Ambulatory Visit: Payer: Self-pay

## 2014-04-16 ENCOUNTER — Observation Stay (HOSPITAL_COMMUNITY): Payer: Medicare Other

## 2014-04-16 ENCOUNTER — Observation Stay (HOSPITAL_COMMUNITY)
Admission: EM | Admit: 2014-04-16 | Discharge: 2014-04-17 | Disposition: A | Discharge status: Home / Self Care | Payer: Medicare Other | Attending: Emergency Medicine | Admitting: Emergency Medicine

## 2014-04-16 ENCOUNTER — Encounter (HOSPITAL_COMMUNITY): Payer: Self-pay | Admitting: Emergency Medicine

## 2014-04-16 DIAGNOSIS — Z85828 Personal history of other malignant neoplasm of skin: Secondary | ICD-10-CM | POA: Diagnosis not present

## 2014-04-16 DIAGNOSIS — R5381 Other malaise: Principal | ICD-10-CM | POA: Insufficient documentation

## 2014-04-16 DIAGNOSIS — Z7902 Long term (current) use of antithrombotics/antiplatelets: Secondary | ICD-10-CM | POA: Diagnosis not present

## 2014-04-16 DIAGNOSIS — E860 Dehydration: Secondary | ICD-10-CM | POA: Diagnosis not present

## 2014-04-16 DIAGNOSIS — N179 Acute kidney failure, unspecified: Secondary | ICD-10-CM | POA: Diagnosis present

## 2014-04-16 DIAGNOSIS — Z8673 Personal history of transient ischemic attack (TIA), and cerebral infarction without residual deficits: Secondary | ICD-10-CM | POA: Insufficient documentation

## 2014-04-16 DIAGNOSIS — F32A Depression, unspecified: Secondary | ICD-10-CM | POA: Diagnosis present

## 2014-04-16 DIAGNOSIS — R4182 Altered mental status, unspecified: Secondary | ICD-10-CM | POA: Diagnosis not present

## 2014-04-16 DIAGNOSIS — E785 Hyperlipidemia, unspecified: Secondary | ICD-10-CM

## 2014-04-16 DIAGNOSIS — M129 Arthropathy, unspecified: Secondary | ICD-10-CM | POA: Insufficient documentation

## 2014-04-16 DIAGNOSIS — Z872 Personal history of diseases of the skin and subcutaneous tissue: Secondary | ICD-10-CM | POA: Insufficient documentation

## 2014-04-16 DIAGNOSIS — R7303 Prediabetes: Secondary | ICD-10-CM

## 2014-04-16 DIAGNOSIS — R5383 Other fatigue: Principal | ICD-10-CM

## 2014-04-16 DIAGNOSIS — Z79899 Other long term (current) drug therapy: Secondary | ICD-10-CM | POA: Insufficient documentation

## 2014-04-16 DIAGNOSIS — I639 Cerebral infarction, unspecified: Secondary | ICD-10-CM

## 2014-04-16 DIAGNOSIS — F419 Anxiety disorder, unspecified: Secondary | ICD-10-CM | POA: Diagnosis present

## 2014-04-16 DIAGNOSIS — J9819 Other pulmonary collapse: Secondary | ICD-10-CM | POA: Diagnosis not present

## 2014-04-16 DIAGNOSIS — R404 Transient alteration of awareness: Secondary | ICD-10-CM | POA: Diagnosis not present

## 2014-04-16 DIAGNOSIS — F329 Major depressive disorder, single episode, unspecified: Secondary | ICD-10-CM

## 2014-04-16 DIAGNOSIS — I10 Essential (primary) hypertension: Secondary | ICD-10-CM | POA: Diagnosis not present

## 2014-04-16 DIAGNOSIS — R531 Weakness: Secondary | ICD-10-CM

## 2014-04-16 DIAGNOSIS — R32 Unspecified urinary incontinence: Secondary | ICD-10-CM

## 2014-04-16 DIAGNOSIS — G40909 Epilepsy, unspecified, not intractable, without status epilepticus: Secondary | ICD-10-CM

## 2014-04-16 DIAGNOSIS — R569 Unspecified convulsions: Secondary | ICD-10-CM

## 2014-04-16 LAB — COMPREHENSIVE METABOLIC PANEL WITH GFR
ALT: 58 U/L — ABNORMAL HIGH (ref 0–35)
AST: 37 U/L (ref 0–37)
Albumin: 3.7 g/dL (ref 3.5–5.2)
Alkaline Phosphatase: 75 U/L (ref 39–117)
Anion gap: 14 (ref 5–15)
BUN: 27 mg/dL — ABNORMAL HIGH (ref 6–23)
CO2: 25 meq/L (ref 19–32)
Calcium: 9.3 mg/dL (ref 8.4–10.5)
Chloride: 97 meq/L (ref 96–112)
Creatinine, Ser: 1.24 mg/dL — ABNORMAL HIGH (ref 0.50–1.10)
GFR calc Af Amer: 48 mL/min — ABNORMAL LOW (ref >90)
GFR calc non Af Amer: 41 mL/min — ABNORMAL LOW (ref >90)
Glucose, Bld: 132 mg/dL — ABNORMAL HIGH (ref 70–99)
Potassium: 4.5 meq/L (ref 3.7–5.3)
Sodium: 136 meq/L — ABNORMAL LOW (ref 137–147)
Total Bilirubin: 0.4 mg/dL (ref 0.3–1.2)
Total Protein: 6.9 g/dL (ref 6.0–8.3)

## 2014-04-16 LAB — CBC
HCT: 42.1 % (ref 36.0–46.0)
HEMOGLOBIN: 14.2 g/dL (ref 12.0–15.0)
MCH: 33.8 pg (ref 26.0–34.0)
MCHC: 33.7 g/dL (ref 30.0–36.0)
MCV: 100.2 fL — ABNORMAL HIGH (ref 78.0–100.0)
PLATELETS: 173 10*3/uL (ref 150–400)
RBC: 4.2 MIL/uL (ref 3.87–5.11)
RDW: 12.7 % (ref 11.5–15.5)
WBC: 12.9 10*3/uL — AB (ref 4.0–10.5)

## 2014-04-16 LAB — CBC WITH DIFFERENTIAL/PLATELET
BASOS ABS: 0 10*3/uL (ref 0.0–0.1)
Basophils Relative: 0 % (ref 0–1)
Eosinophils Absolute: 0 10*3/uL (ref 0.0–0.7)
Eosinophils Relative: 0 % (ref 0–5)
HCT: 19 % — ABNORMAL LOW (ref 36.0–46.0)
Hemoglobin: 4.8 g/dL — CL (ref 12.0–15.0)
LYMPHS PCT: 9 % — AB (ref 12–46)
Lymphs Abs: 1 10*3/uL (ref 0.7–4.0)
MCH: 25.7 pg — ABNORMAL LOW (ref 26.0–34.0)
MCHC: 25.3 g/dL — ABNORMAL LOW (ref 30.0–36.0)
MCV: 101.6 fL — ABNORMAL HIGH (ref 78.0–100.0)
Monocytes Absolute: 1.1 10*3/uL — ABNORMAL HIGH (ref 0.1–1.0)
Monocytes Relative: 9 % (ref 3–12)
NEUTROS ABS: 9.7 10*3/uL — AB (ref 1.7–7.7)
NEUTROS PCT: 82 % — AB (ref 43–77)
PLATELETS: DECREASED 10*3/uL (ref 150–400)
RBC: 1.87 MIL/uL — AB (ref 3.87–5.11)
RDW: 12.8 % (ref 11.5–15.5)
WBC: 11.8 10*3/uL — AB (ref 4.0–10.5)

## 2014-04-16 LAB — CREATININE, SERUM
CREATININE: 1.15 mg/dL — AB (ref 0.50–1.10)
GFR calc non Af Amer: 45 mL/min — ABNORMAL LOW (ref >90)
GFR, EST AFRICAN AMERICAN: 52 mL/min — AB (ref >90)

## 2014-04-16 LAB — URINALYSIS, ROUTINE W REFLEX MICROSCOPIC
Glucose, UA: NEGATIVE mg/dL
HGB URINE DIPSTICK: NEGATIVE
Ketones, ur: 15 mg/dL — AB
Nitrite: NEGATIVE
PROTEIN: NEGATIVE mg/dL
Specific Gravity, Urine: 1.022 (ref 1.005–1.030)
Urobilinogen, UA: 1 mg/dL (ref 0.0–1.0)
pH: 5 (ref 5.0–8.0)

## 2014-04-16 LAB — URINE MICROSCOPIC-ADD ON

## 2014-04-16 LAB — HEMOGLOBIN AND HEMATOCRIT, BLOOD
HCT: 42.6 % (ref 36.0–46.0)
HEMOGLOBIN: 14.3 g/dL (ref 12.0–15.0)

## 2014-04-16 LAB — TSH: TSH: 2.18 u[IU]/mL (ref 0.350–4.500)

## 2014-04-16 LAB — PHENYTOIN LEVEL, TOTAL: Phenytoin Lvl: 11.9 ug/mL (ref 10.0–20.0)

## 2014-04-16 MED ORDER — SODIUM CHLORIDE 0.9 % IV SOLN
INTRAVENOUS | Status: DC
  Administered 2014-04-16 – 2014-04-17 (×2): via INTRAVENOUS

## 2014-04-16 MED ORDER — OXYBUTYNIN CHLORIDE 5 MG PO TABS
5.0000 mg | ORAL_TABLET | Freq: Three times a day (TID) | ORAL | Status: DC
  Administered 2014-04-16 – 2014-04-17 (×2): 5 mg via ORAL
  Filled 2014-04-16 (×4): qty 1

## 2014-04-16 MED ORDER — AMITRIPTYLINE HCL 25 MG PO TABS
25.0000 mg | ORAL_TABLET | Freq: Every day | ORAL | Status: DC
  Administered 2014-04-16: 25 mg via ORAL
  Filled 2014-04-16 (×2): qty 1

## 2014-04-16 MED ORDER — OXYCODONE-ACETAMINOPHEN 5-325 MG PO TABS
2.0000 | ORAL_TABLET | Freq: Once | ORAL | Status: AC
  Administered 2014-04-16: 2 via ORAL
  Filled 2014-04-16: qty 2

## 2014-04-16 MED ORDER — TRAZODONE HCL 150 MG PO TABS
150.0000 mg | ORAL_TABLET | Freq: Every day | ORAL | Status: DC
  Administered 2014-04-16: 150 mg via ORAL
  Filled 2014-04-16 (×2): qty 1

## 2014-04-16 MED ORDER — HEPARIN SODIUM (PORCINE) 5000 UNIT/ML IJ SOLN
5000.0000 [IU] | Freq: Three times a day (TID) | INTRAMUSCULAR | Status: DC
  Administered 2014-04-16 – 2014-04-17 (×2): 5000 [IU] via SUBCUTANEOUS
  Filled 2014-04-16 (×5): qty 1

## 2014-04-16 MED ORDER — SERTRALINE HCL 50 MG PO TABS
50.0000 mg | ORAL_TABLET | Freq: Every day | ORAL | Status: DC
Start: 2014-04-16 — End: 2014-04-17
  Administered 2014-04-16 – 2014-04-17 (×2): 50 mg via ORAL
  Filled 2014-04-16 (×2): qty 1

## 2014-04-16 MED ORDER — ACETAMINOPHEN 325 MG PO TABS: 650.0000 mg | ORAL_TABLET | Freq: Four times a day (QID) | ORAL | Status: DC | PRN

## 2014-04-16 MED ORDER — ONDANSETRON HCL 4 MG/2ML IJ SOLN: 4.0000 mg | Freq: Three times a day (TID) | INTRAMUSCULAR | Status: DC | PRN

## 2014-04-16 MED ORDER — ATORVASTATIN CALCIUM 80 MG PO TABS
80.0000 mg | ORAL_TABLET | Freq: Every day | ORAL | Status: DC
Start: 2014-04-16 — End: 2014-04-17
  Administered 2014-04-16: 80 mg via ORAL
  Filled 2014-04-16 (×2): qty 1

## 2014-04-16 MED ORDER — OXYBUTYNIN CHLORIDE 5 MG PO TABS: 5.0000 mg | ORAL_TABLET | Freq: Three times a day (TID) | ORAL | Status: DC

## 2014-04-16 MED ORDER — SODIUM CHLORIDE 0.9 % IV BOLUS (SEPSIS)
500.0000 mL | Freq: Once | INTRAVENOUS | Status: AC
  Administered 2014-04-16: 500 mL via INTRAVENOUS

## 2014-04-16 MED ORDER — CLOPIDOGREL BISULFATE 75 MG PO TABS
75.0000 mg | ORAL_TABLET | Freq: Every evening | ORAL | Status: DC
  Administered 2014-04-16: 75 mg via ORAL
  Filled 2014-04-16 (×2): qty 1

## 2014-04-16 MED ORDER — GABAPENTIN 300 MG PO CAPS
900.0000 mg | ORAL_CAPSULE | Freq: Three times a day (TID) | ORAL | Status: DC
  Administered 2014-04-16 – 2014-04-17 (×2): 900 mg via ORAL
  Filled 2014-04-16 (×4): qty 3

## 2014-04-16 MED ORDER — LORAZEPAM 1 MG PO TABS: 1.0000 mg | ORAL_TABLET | Freq: Every day | ORAL | Status: DC | PRN

## 2014-04-16 MED ORDER — ONDANSETRON HCL 4 MG/2ML IJ SOLN: 4.0000 mg | Freq: Four times a day (QID) | INTRAMUSCULAR | Status: DC | PRN

## 2014-04-16 MED ORDER — ACETAMINOPHEN 650 MG RE SUPP: 650.0000 mg | Freq: Four times a day (QID) | RECTAL | Status: DC | PRN

## 2014-04-16 MED ORDER — DOCUSATE SODIUM 100 MG PO CAPS
100.0000 mg | ORAL_CAPSULE | Freq: Two times a day (BID) | ORAL | Status: DC
  Administered 2014-04-16 – 2014-04-17 (×2): 100 mg via ORAL
  Filled 2014-04-16 (×3): qty 1

## 2014-04-16 MED ORDER — ONDANSETRON HCL 4 MG PO TABS: 4.0000 mg | ORAL_TABLET | Freq: Four times a day (QID) | ORAL | Status: DC | PRN

## 2014-04-16 MED ORDER — PHENYTOIN SODIUM EXTENDED 100 MG PO CAPS
200.0000 mg | ORAL_CAPSULE | Freq: Two times a day (BID) | ORAL | Status: DC
  Administered 2014-04-16 – 2014-04-17 (×2): 200 mg via ORAL
  Filled 2014-04-16 (×3): qty 2

## 2014-04-16 NOTE — ED Provider Notes (Signed)
CSN: [id number]     Arrival date & time 04/16/14  1109 History   First MD Initiated Contact with Patient 04/16/14 1110     Chief Complaint  Patient presents with  . Altered Mental Status     (Consider location/radiation/quality/duration/timing/severity/associated sxs/prior Treatment) HPI Comments: 78 year old female with a history of stroke, hypertension and depression who presents with a complaint of some altered mental status. The patient lives with her daughter, according to paramedics the patient had gotten out of bed, attempted to use the bedside commode to have a bowel movement, stool was in the commode, on the seat and on the floor and the patient was attempting to clean the stool when she was found by her daughter. The patient is unclear as to why she had stool on the floor, she was unclear as to what had happened the morning and thus her daughter wanted her transported to the hospital for evaluation of her confusion. This is according to the paramedics. There is no other family here on the patient's arrival. The patient denies any complaints including pain, difficulty breathing, weakness, numbness, headache, blurred vision or any other complaints. Evidently when the daughter found her she had weak legs bilaterally but this has since resolved. Paramedics report normal sinus rhythm normal vital signs and no neurologic abnormalities on their exam and in route to the hospital.  Patient is a 78 y.o. female presenting with altered mental status. The history is provided by the patient.  Altered Mental Status   Past Medical History  Diagnosis Date  . Stroke   . Hypertension   . Hyperlipidemia   . Arthritis   . Depression   . Ulcer   . History of blood transfusion   . Urinary incontinence   . Allergy   . Skin cancer of nose   . Cellulitis of left leg   . Pre-diabetes    Past Surgical History  Procedure Laterality Date  . Tonsillectomy  1944  . Tubal ligation    . Bilateral  oophorectomy  06/2012    ovarian cysts  . Skin cancer excision  2007    reomved from nose   Family History  Problem Relation Age of Onset  . Heart disease Other     Parent  . Cancer Maternal Uncle    History  Substance Use Topics  . Smoking status: Never Smoker   . Smokeless tobacco: Never Used  . Alcohol Use: No   OB History   Grav Para Term Preterm Abortions TAB SAB Ect Mult Living                 Review of Systems  All other systems reviewed and are negative.     Allergies  Codeine and Ether  Home Medications   Prior to Admission medications   Medication Sig Start Date End Date Taking? Authorizing Provider  amitriptyline (ELAVIL) 25 MG tablet Take 1 tablet (25 mg total) by mouth at bedtime. 01/22/14  Yes Jearld Fenton, NP  atorvastatin (LIPITOR) 80 MG tablet Take 1 tablet (80 mg total) by mouth daily. 03/09/14  Yes Jearld Fenton, NP  bisacodyl (DULCOLAX) 5 MG EC tablet Take 5 mg by mouth daily as needed for moderate constipation.   Yes Historical Provider, MD  Cholecalciferol 2000 UNITS TABS Take 2,000 Units by mouth every morning.    Yes Historical Provider, MD  clopidogrel (PLAVIX) 75 MG tablet Take 1 tablet (75 mg total) by mouth every evening. 01/22/14  Yes Coralie Keens [place],  NP  diltiazem (CARDIZEM CD) 180 MG 24 hr capsule Take 1 capsule (180 mg total) by mouth daily. 01/08/14  Yes Jearld Fenton, NP  diphenhydramine-acetaminophen (TYLENOL PM) 25-500 MG TABS Take 1 tablet by mouth 2 (two) times daily.    Yes Historical Provider, MD  docusate sodium (COLACE) 100 MG capsule Take 100 mg by mouth 2 (two) times daily.   Yes Historical Provider, MD  ferrous sulfate 325 (65 FE) MG EC tablet Take 325 mg by mouth daily with breakfast.    Yes Historical Provider, MD  gabapentin (NEURONTIN) 300 MG capsule Take 3 capsules (900 mg total) by mouth 3 (three) times daily. Takes 600mg  in morning, 300mg  at noon and 600mg  at bedtime 04/09/14  Yes Jearld Fenton, NP  hydrochlorothiazide  (MICROZIDE) 12.5 MG capsule Take 12.5 mg by mouth daily.   Yes Historical Provider, MD  lisinopril (PRINIVIL,ZESTRIL) 5 MG tablet Take 1 tablet (5 mg total) by mouth daily. 12/14/13  Yes Jearld Fenton, NP  LORazepam (ATIVAN) 1 MG tablet Take 1 mg by mouth daily as needed.  03/09/14  Yes Jearld Fenton, NP  phenytoin (DILANTIN) 100 MG ER capsule Take 2 capsules (200 mg total) by mouth 2 (two) times daily. 04/09/14  Yes Jearld Fenton, NP  sertraline (ZOLOFT) 50 MG tablet Take 1 tablet (50 mg total) by mouth daily. 02/14/14  Yes Jearld Fenton, NP  traZODone (DESYREL) 50 MG tablet Take 3 tablets (150 mg total) by mouth at bedtime. 04/09/14  Yes Jearld Fenton, NP  vitamin B-12 (CYANOCOBALAMIN) 1000 MCG tablet Take 1,000 mcg by mouth daily.   Yes Historical Provider, MD  nitroGLYCERIN (NITROSTAT) 0.4 MG SL tablet Place 1 tablet (0.4 mg total) under the tongue every 5 (five) minutes as needed for chest pain. 10/05/13   Jearld Fenton, NP  oxybutynin (DITROPAN) 5 MG tablet Take 1 tablet (5 mg total) by mouth 3 (three) times daily. 04/16/14   Jearld Fenton, NP   BP 116/45  Pulse 81  Temp(Src) 98.6 F (37 C) (Oral)  Resp 15  SpO2 93% Physical Exam  Nursing note and vitals reviewed. Constitutional: She appears well-developed and well-nourished. No distress.  HENT:  Head: Normocephalic and atraumatic.  Mouth/Throat: Oropharynx is clear and moist. No oropharyngeal exudate.  Eyes: Conjunctivae and EOM are normal. Pupils are equal, round, and reactive to light. Right eye exhibits no discharge. Left eye exhibits no discharge. No scleral icterus.  Neck: Normal range of motion. Neck supple. No JVD present. No thyromegaly present.  Cardiovascular: Normal rate, regular rhythm, normal heart sounds and intact distal pulses.  Exam reveals no gallop and no friction rub.   No murmur heard. Pulmonary/Chest: Effort normal and breath sounds normal. No respiratory distress. She has no wheezes. She has no rales.   Abdominal: Soft. Bowel sounds are normal. She exhibits no distension and no mass. There is no tenderness.  Musculoskeletal: Normal range of motion. She exhibits no edema and no tenderness.  Lymphadenopathy:    She has no cervical adenopathy.  Neurological: She is alert. Coordination normal.  The patient is alert and oriented, moves all 4 extremities without difficulty, normal strength in both the arms and the legs, normal sensation to all 4 extremities, cranial nerves III through XII intact, normal finger-nose-finger.  Skin: Skin is warm and dry. No rash noted. No erythema.  Psychiatric: She has a normal mood and affect. Her behavior is normal.    ED Course  Procedures (including critical  care time) Labs Review Labs Reviewed  CBC WITH DIFFERENTIAL - Abnormal; Notable for the following:    WBC 11.8 (*)    RBC 1.87 (*)    Hemoglobin 4.8 (*)    HCT 19.0 (*)    MCV 101.6 (*)    MCH 25.7 (*)    MCHC 25.3 (*)    Neutrophils Relative % 82 (*)    Neutro Abs 9.7 (*)    Lymphocytes Relative 9 (*)    Monocytes Absolute 1.1 (*)    All other components within normal limits  COMPREHENSIVE METABOLIC PANEL - Abnormal; Notable for the following:    Sodium 136 (*)    Glucose, Bld 132 (*)    BUN 27 (*)    Creatinine, Ser 1.24 (*)    ALT 58 (*)    GFR calc non Af Amer 41 (*)    GFR calc Af Amer 48 (*)    All other components within normal limits  URINALYSIS, ROUTINE W REFLEX MICROSCOPIC - Abnormal; Notable for the following:    Color, Urine RED (*)    APPearance CLOUDY (*)    Bilirubin Urine SMALL (*)    Ketones, ur 15 (*)    Leukocytes, UA SMALL (*)    All other components within normal limits  PHENYTOIN LEVEL, TOTAL  HEMOGLOBIN AND HEMATOCRIT, BLOOD  URINE MICROSCOPIC-ADD ON    Imaging Review Ct Head Wo Contrast  04/16/2014   CLINICAL DATA:  Altered mental status and weakness with history of previous CVA and seizures.  EXAM: CT HEAD WITHOUT CONTRAST  TECHNIQUE: Contiguous axial  images were obtained from the base of the skull through the vertex without intravenous contrast.  COMPARISON:  None.  FINDINGS: There is mild diffuse cerebral and cerebellar atrophy with compensatory ventriculomegaly. There is decreased density in the deep white matter of both cerebral hemispheres consistent with chronic small vessel ischemia. There is no acute intracranial hemorrhage nor evidence of acute ischemic change. The cerebellum and brainstem exhibit no acute abnormalities.  The observed paranasal sinuses exhibit mucoperiosteal thickening within ethmoid and right maxillary sinuses. There is no lytic or blastic skull lesion.  IMPRESSION: 1. There is no acute intracranial hemorrhage nor acute ischemic change. There is no intracranial mass effect nor hydrocephalus. 2. There are age related changes of chronic small vessel ischemia and mild diffuse atrophy.   Electronically Signed   By: David  Martinique   On: 04/16/2014 11:57     EKG Interpretation   Date/Time:  Monday April 16 2014 11:53:25 EDT Ventricular Rate:  91 PR Interval:  164 QRS Duration: 85 QT Interval:  371 QTC Calculation: 456 R Axis:   12 Text Interpretation:  Sinus rhythm Borderline T wave abnormalities No old  tracing to compare Abnormal ekg Confirmed by Lyrical Sowle  MD, [place] ([id number]) on  04/16/2014 12:56:27 PM      MDM   Final diagnoses:  Weakness  Dehydration    The patient is unremarkable in her appearance at this time, there is no focal deficits and no signs of cardiac arrhythmias. Her exam is reassuring, check labs, EKG, patient has had stroke and is on anticoagulants for a CT scan of the head has been ordered as well as a Dilantin level.   The patient was unable to ambulate at the bedside, her vital signs remain stable, her CT scan did not show acute infarct or hemorrhage and labs have shown slight dehydration with elevation in creatinine and BUN. The patient was able to ambulate required significant  assistance and  still felt she could not take one step without collapsing. This was discussed with the hospitalist Dr. Wynelle Cleveland one who will admit.  Meds given in ED:  Medications  oxyCODONE-acetaminophen (PERCOCET/ROXICET) 5-325 MG per tablet 2 tablet (2 tablets Oral Given 04/16/14 1229)  sodium chloride 0.9 % bolus 500 mL (500 mLs Intravenous New Bag/Given 04/16/14 1443)       Johnna Acosta, MD 04/16/14 1521

## 2014-04-16 NOTE — Progress Notes (Signed)
Admission note:  Arrival Method: ED stretcher  Mental Orientation: alert and oriented  Telemetry: not ordered  Assessment:  Skin: redness to pt's bottom; skin blanchable  IV: right thumb; NS at 187mL/hr Pain: pt denies pain  Tubes: N/A  Safety Measures: Patient Handbook has been given, and discussed the Fall Prevention worksheet. Left at bedside  Admission: Completed and admission orders have been written  6E Orientation: Patient has been oriented to the unit, staff and to the room.  Family: At the bedside; Daughter Jackelyn Poling

## 2014-04-16 NOTE — H&P (Signed)
[facility] History and Physical  Carol Page [id number] DOB: 12/15/1935 DOA: 04/16/2014   PCP: Webb Silversmith, NP    Chief Complaint: generalized weakness  HPI: Carol Page is a 78 y.o. female with PMH of HTN, remote h/o CVA, depression, hyperlipidemia who is brought to the hospital by her daughter. The daughter found her in bed this morning and tried to get her out of bed. The patient did not have the strength to sit up on her own and was quite weak once assisted into a sitting position, per daughter She was not able to get out of bed and walk and this is not her baseline. The daughter noted that the patient's bedside commode was filled with stool and there was more stool on the outside of the commode and on the floor. No blood noted in the stool. The patient did not recall waking up at night to have a BM and it is not normal for her to go on the floor. She has no c/o abdominal pain, vomiting or fever. She has not had any further stools. She is being admitted for severe generalized weakness, hypotension and acute renal failure.    General: The patient denies anorexia, fever, weight loss Cardiac: Denies chest pain, syncope, palpitations, pedal edema  Respiratory: Denies cough, shortness of breath, wheezing GI: Denies severe indigestion/heartburn, abdominal pain, nausea, vomiting, diarrhea and constipation GU: Denies hematuria, incontinence, dysuria  Musculoskeletal: Denies arthritis  Skin: Denies suspicious skin lesions Neurologic: Denies focal weakness or numbness, change in vision  Past Medical History  Diagnosis Date  . Stroke   . Hypertension   . Hyperlipidemia   . Arthritis   . Depression   . Ulcer   . History of blood transfusion   . Urinary incontinence   . Allergy   . Skin cancer of nose   . Cellulitis of left leg   . Pre-diabetes     Past Surgical History  Procedure Laterality Date  . Tonsillectomy  1944  . Tubal ligation    . Bilateral oophorectomy   06/2012    ovarian cysts  . Skin cancer excision  2007    reomved from nose    Social History: does not smoke or drink- uses a walker and has a wheelchair.  Lives at home with daughtert   Allergies  Allergen Reactions  . Codeine Nausea And Vomiting  . Ether Nausea And Vomiting    Family History  Problem Relation Age of Onset  . Heart disease Other     Parent  . Cancer Maternal Uncle       Prior to Admission medications   Medication Sig Start Date End Date Taking? Authorizing Provider  amitriptyline (ELAVIL) 25 MG tablet Take 1 tablet (25 mg total) by mouth at bedtime. 01/22/14  Yes Jearld Fenton, NP  atorvastatin (LIPITOR) 80 MG tablet Take 1 tablet (80 mg total) by mouth daily. 03/09/14  Yes Jearld Fenton, NP  bisacodyl (DULCOLAX) 5 MG EC tablet Take 5 mg by mouth daily as needed for moderate constipation.   Yes Historical Provider, MD  Cholecalciferol 2000 UNITS TABS Take 2,000 Units by mouth every morning.    Yes Historical Provider, MD  clopidogrel (PLAVIX) 75 MG tablet Take 1 tablet (75 mg total) by mouth every evening. 01/22/14  Yes Jearld Fenton, NP  diltiazem (CARDIZEM CD) 180 MG 24 hr capsule Take 1 capsule (180 mg total) by mouth daily. 01/08/14  Yes Jearld Fenton, NP  diphenhydramine-acetaminophen (TYLENOL PM) 25-500  MG TABS Take 1 tablet by mouth 2 (two) times daily.    Yes Historical Provider, MD  docusate sodium (COLACE) 100 MG capsule Take 100 mg by mouth 2 (two) times daily.   Yes Historical Provider, MD  ferrous sulfate 325 (65 FE) MG EC tablet Take 325 mg by mouth daily with breakfast.    Yes Historical Provider, MD  gabapentin (NEURONTIN) 300 MG capsule Take 3 capsules (900 mg total) by mouth 3 (three) times daily. Takes 600mg  in morning, 300mg  at noon and 600mg  at bedtime 04/09/14  Yes Jearld Fenton, NP  hydrochlorothiazide (MICROZIDE) 12.5 MG capsule Take 12.5 mg by mouth daily.   Yes Historical Provider, MD  lisinopril (PRINIVIL,ZESTRIL) 5 MG tablet Take 1  tablet (5 mg total) by mouth daily. 12/14/13  Yes Jearld Fenton, NP  LORazepam (ATIVAN) 1 MG tablet Take 1 mg by mouth daily as needed.  03/09/14  Yes Jearld Fenton, NP  phenytoin (DILANTIN) 100 MG ER capsule Take 2 capsules (200 mg total) by mouth 2 (two) times daily. 04/09/14  Yes Jearld Fenton, NP  sertraline (ZOLOFT) 50 MG tablet Take 1 tablet (50 mg total) by mouth daily. 02/14/14  Yes Jearld Fenton, NP  traZODone (DESYREL) 50 MG tablet Take 3 tablets (150 mg total) by mouth at bedtime. 04/09/14  Yes Jearld Fenton, NP  vitamin B-12 (CYANOCOBALAMIN) 1000 MCG tablet Take 1,000 mcg by mouth daily.   Yes Historical Provider, MD  nitroGLYCERIN (NITROSTAT) 0.4 MG SL tablet Place 1 tablet (0.4 mg total) under the tongue every 5 (five) minutes as needed for chest pain. 10/05/13   Jearld Fenton, NP  oxybutynin (DITROPAN) 5 MG tablet Take 1 tablet (5 mg total) by mouth 3 (three) times daily. 04/16/14   Jearld Fenton, NP     Physical Exam: Filed Vitals:   04/16/14 1430 04/16/14 1445 04/16/14 1515 04/16/14 1532  BP: 103/56 116/45 114/32 103/49  Pulse: 84 81 86 83  Temp:      TempSrc:      Resp: 14 15 22 16   SpO2: 94% 93% 95% 91%     General: AAO x3, no distress HEENT: Normocephalic and Atraumatic, Mucous membranes pink                PERRLA; EOM intact; No scleral icterus,                 Nares: Patent, Oropharynx: Clear, Fair Dentition                 Neck: FROM, no cervical lymphadenopathy, thyromegaly, carotid bruit or JVD;  Breasts: deferred CHEST WALL: No tenderness  CHEST: coarse crackles at bases- O2 sat 91% on room air HEART: Regular rate and rhythm; 2/6 murmur at RUS border BACK: No kyphosis or scoliosis; no CVA tenderness  ABDOMEN: Positive Bowel Sounds, soft, non-tender; no masses, no organomegaly Rectal Exam: deferred EXTREMITIES: No cyanosis, clubbing, or edema Genitalia: not examined  SKIN:  no rash or ulceration  CNS: Alert and Oriented x 4, Nonfocal exam, CN 2-12  intact  Labs on Admission:  Basic Metabolic Panel:  Recent Labs Lab 04/16/14 1117  NA 136*  K 4.5  CL 97  CO2 25  GLUCOSE 132*  BUN 27*  CREATININE 1.24*  CALCIUM 9.3   Liver Function Tests:  Recent Labs Lab 04/16/14 1117  AST 37  ALT 58*  ALKPHOS 75  BILITOT 0.4  PROT 6.9  ALBUMIN 3.7   No results  found for this basename: LIPASE, AMYLASE,  in the last 168 hours No results found for this basename: AMMONIA,  in the last 168 hours CBC:  Recent Labs Lab 04/16/14 1117 04/16/14 1301  WBC 11.8*  --   NEUTROABS 9.7*  --   HGB 4.8* 14.3  HCT 19.0* 42.6  MCV 101.6*  --   PLT PLATELETS APPEAR DECREASED  --    Cardiac Enzymes: No results found for this basename: CKTOTAL, CKMB, CKMBINDEX, TROPONINI,  in the last 168 hours  BNP (last 3 results) No results found for this basename: PROBNP,  in the last 8760 hours CBG: No results found for this basename: GLUCAP,  in the last 168 hours  Radiological Exams on Admission: Ct Head Wo Contrast  04/16/2014   CLINICAL DATA:  Altered mental status and weakness with history of previous CVA and seizures.  EXAM: CT HEAD WITHOUT CONTRAST  TECHNIQUE: Contiguous axial images were obtained from the base of the skull through the vertex without intravenous contrast.  COMPARISON:  None.  FINDINGS: There is mild diffuse cerebral and cerebellar atrophy with compensatory ventriculomegaly. There is decreased density in the deep white matter of both cerebral hemispheres consistent with chronic small vessel ischemia. There is no acute intracranial hemorrhage nor evidence of acute ischemic change. The cerebellum and brainstem exhibit no acute abnormalities.  The observed paranasal sinuses exhibit mucoperiosteal thickening within ethmoid and right maxillary sinuses. There is no lytic or blastic skull lesion.  IMPRESSION: 1. There is no acute intracranial hemorrhage nor acute ischemic change. There is no intracranial mass effect nor hydrocephalus. 2. There  are age related changes of chronic small vessel ischemia and mild diffuse atrophy.   Electronically Signed   By: David  Martinique   On: 04/16/2014 11:57    EKG: Independently reviewed. Sinus rhythm   Assessment/Plan Principal Problem:   ARF (acute renal failure) and hypotension - hydrate with IVF and follow renal function - hold all antihypertensives  Active Problems:   Weakness - may be related to ARF - will hydrated and obtain PT eval in AM - check TSH and free T4 as well - the patient lives with her daughter and hopefully can go home with home health  Relaative hypoxia with crackles at bases of lungs - obtain CXR- start incentive spirometry- monitor for feves    Severe obesity (BMI >= 40)    Seizures - Dilantin level normal    Anxiety and depression - cont home meds  Urinary incontinence - cont Oxybutynin      Consulted: none  Code Status: Full code  Family Communication: with daughter at bedside  DVT Prophylaxis:Heparin  Time spent: > 84 min  Ab Leaming, MD [facility]  If 7PM-7AM, please contact night-coverage [email or web address] 04/16/2014, 4:12 PM

## 2014-04-16 NOTE — ED Notes (Signed)
[facility] presents with a 78 yo female from home with altered mental status.  Pt was found in bed with stool on floor in bathroom and side of toliet but doesn't remember how the stool got there nor having a bowel movement; found by daughter at approximately 8:30 am this morning.  Pt stated her legs felt shaky and has not had her medication today.  Hx of seizures.  Got a flu shot and pneumonia shot this past Monday.  Pt's daughter's concern was that pt had another UTI and because of weakness family could not pick patient up to take to PCP, called [facility].

## 2014-04-17 DIAGNOSIS — R5381 Other malaise: Secondary | ICD-10-CM | POA: Diagnosis not present

## 2014-04-17 DIAGNOSIS — R5383 Other fatigue: Secondary | ICD-10-CM | POA: Diagnosis not present

## 2014-04-17 DIAGNOSIS — E860 Dehydration: Secondary | ICD-10-CM

## 2014-04-17 DIAGNOSIS — I10 Essential (primary) hypertension: Secondary | ICD-10-CM | POA: Diagnosis not present

## 2014-04-17 LAB — CBC
HCT: 36.6 % (ref 36.0–46.0)
Hemoglobin: 12.2 g/dL (ref 12.0–15.0)
MCH: 33.4 pg (ref 26.0–34.0)
MCHC: 33.3 g/dL (ref 30.0–36.0)
MCV: 100.3 fL — ABNORMAL HIGH (ref 78.0–100.0)
PLATELETS: 142 10*3/uL — AB (ref 150–400)
RBC: 3.65 MIL/uL — ABNORMAL LOW (ref 3.87–5.11)
RDW: 12.7 % (ref 11.5–15.5)
WBC: 8.3 10*3/uL (ref 4.0–10.5)

## 2014-04-17 LAB — BASIC METABOLIC PANEL
ANION GAP: 11 (ref 5–15)
BUN: 23 mg/dL (ref 6–23)
CALCIUM: 8 mg/dL — AB (ref 8.4–10.5)
CHLORIDE: 102 meq/L (ref 96–112)
CO2: 24 mEq/L (ref 19–32)
CREATININE: 0.86 mg/dL (ref 0.50–1.10)
GFR calc Af Amer: 74 mL/min — ABNORMAL LOW (ref >90)
GFR, EST NON AFRICAN AMERICAN: 64 mL/min — AB (ref >90)
Glucose, Bld: 122 mg/dL — ABNORMAL HIGH (ref 70–99)
Potassium: 3.9 mEq/L (ref 3.7–5.3)
Sodium: 137 mEq/L (ref 137–147)

## 2014-04-17 LAB — T4, FREE: FREE T4: 0.85 ng/dL (ref 0.80–1.80)

## 2014-04-17 MED ORDER — LISINOPRIL 5 MG PO TABS: 5.0000 mg | ORAL_TABLET | Freq: Every day | ORAL | Status: DC

## 2014-04-17 NOTE — Progress Notes (Signed)
CM referral for home health PT.  Patient selected [place] for home health services. [facility] called with referral

## 2014-04-17 NOTE — Evaluation (Signed)
Physical Therapy Evaluation Patient Details Name: Carol Page MRN: [id number] DOB: 10-Sep-1935 Today's Date: 04/17/2014   History of Present Illness  HPI: Carol Page is a 78 y.o. female with PMH of HTN, remote h/o CVA, depression, hyperlipidemia who is brought to the hospital by her daughter. The daughter found her in bed this morning and tried to get her out of bed. The patient did not have the strength to sit up on her own and was quite weak once assisted into a sitting position, per daughter She was not able to get out of bed and walk and this is not her baseline. The daughter noted that the patient's bedside commode was filled with stool and there was more stool on the outside of the commode and on the floor. No blood noted in the stool. The patient did not recall waking up at night to have a BM and it is not normal for her to go on the floor. She has no c/o abdominal pain, vomiting or fever. She has not had any further stools. She is being admitted for severe generalized weakness, hypotension and acute renal failure.   Clinical Impression   Pt admitted with above. Pt currently with functional limitations due to the deficits listed below (see PT Problem List).  Pt will benefit from skilled PT to increase their independence and safety with mobility to allow discharge to the venue listed below.       Follow Up Recommendations Home health PT;Supervision/Assistance - 24 hour    Equipment Recommendations  None recommended by PT (HHPT can assist with poss rec of wheelchair)    Recommendations for Other Services OT consult     Precautions / Restrictions Precautions Precautions: Fall      Mobility  Bed Mobility Overal bed mobility: Needs Assistance Bed Mobility: Supine to Sit     Supine to sit: Min guard     General bed mobility comments: Used bed rails, but not needing phsycial assist  Transfers Overall transfer level: Needs assistance   Transfers: Stand Pivot Transfers    Stand pivot transfers: Mod assist       General transfer comment: Mod assist for steadiness during transition to chair; Limited to pivot transfer due to pt's pain  Ambulation/Gait             General Gait Details: deferred  Stairs            Wheelchair Mobility    Modified Rankin (Stroke Patients Only)       Balance Overall balance assessment: Needs assistance Sitting-balance support: Single extremity supported Sitting balance-Leahy Scale: Fair     Standing balance support: Bilateral upper extremity supported Standing balance-Leahy Scale: Poor                               Pertinent Vitals/Pain Pain Assessment: 0-10 Pain Score: 8  Pain Location: throughout LLE Pain Descriptors / Indicators: Aching Pain Intervention(s): Limited activity within patient's tolerance;Monitored during session;Repositioned;Patient requesting pain meds-RN notified    Home Living Family/patient expects to be discharged to:: Private residence Living Arrangements: Children Available Help at Discharge: Family Type of Home: House Home Access: Stairs to enter   Technical brewer of Steps: 2 Home Layout: One level Home Equipment: Environmental consultant - 2 wheels;Shower seat;Bedside commode      Prior Function Level of Independence: Needs assistance   Gait / Transfers Assistance Needed: reports daughter has to help her at times but will get  up alone when she has to   ADL's / Homemaking Assistance Needed: pt's dtr takes care of cooking and home mgt. Pt's dtr also assists her with set up of UB ADLs and mod - max A with LB ADLs        Hand Dominance   Dominant Hand: Right    Extremity/Trunk Assessment   Upper Extremity Assessment: Overall WFL for tasks assessed           Lower Extremity Assessment: Generalized weakness (LLE pain)         Communication   Communication: No difficulties  Cognition Arousal/Alertness: Awake/alert Behavior During Therapy: WFL for  tasks assessed/performed Overall Cognitive Status: History of cognitive impairments - at baseline                      General Comments      Exercises        Assessment/Plan    PT Assessment Patient needs continued PT services  PT Diagnosis Generalized weakness   PT Problem List Decreased strength;Decreased activity tolerance;Decreased balance;Decreased mobility;Decreased coordination;Decreased knowledge of use of DME;Decreased safety awareness;Obesity  PT Treatment Interventions DME instruction;Gait training;Stair training;Functional mobility training;Therapeutic activities;Therapeutic exercise;Patient/family education   PT Goals (Current goals can be found in the Care Plan section) Acute Rehab PT Goals Patient Stated Goal: did nto state PT Goal Formulation: With patient Time For Goal Achievement: 05/01/14 Potential to Achieve Goals: Good    Frequency Min 3X/week   Barriers to discharge        Co-evaluation               End of Session   Activity Tolerance: Patient tolerated treatment well Patient left: in chair;with call bell/phone within reach;with chair alarm set Nurse Communication: Mobility status;Patient requests pain meds    Functional Assessment Tool Used: Clinical Judgement Functional Limitation: Mobility: Walking and moving around Mobility: Walking and Moving Around Current Status [phone]): At least 20 percent but less than 40 percent impaired, limited or restricted Mobility: Walking and Moving Around Goal Status [phone]): At least 1 percent but less than 20 percent impaired, limited or restricted    Time: 0957-1019 PT Time Calculation (min): 22 min   Charges:   PT Evaluation $Initial PT Evaluation Tier I: 1 Procedure PT Treatments $Therapeutic Activity: 8-22 mins   PT G Codes:   Functional Assessment Tool Used: Clinical Judgement Functional Limitation: Mobility: Walking and moving around    [place] 04/17/2014, 12:08  PM  Roney [place], [place] Pager [phone] Office [phone]

## 2014-04-17 NOTE — Care Management Note (Signed)
CARE MANAGEMENT NOTE 04/17/2014  Patient:  Carol Page, Carol Page   Account Number:  [id number]  Date Initiated:  04/17/2014  Documentation initiated by:  Cherisa Brucker  Subjective/Objective Assessment:   CM following for progress and d/c planning.     Action/Plan:   CM met with pt and daughter re d/c needs. Pt and family selected [facility] for HHPT.   Anticipated DC Date:  04/17/2014   Anticipated DC Plan:  [place]         Choice offered to / List presented to:          [facility] arranged  [place].   Status of service:  Completed, signed off Medicare Important Message given?  NA - LOS <3 / Initial given by admissions (If response is "NO", the following Medicare IM given date fields will be blank) Date Medicare IM given:   Medicare IM given by:   Date Additional Medicare IM given:   Additional Medicare IM given by:    Discharge Disposition:  [place]  Per UR Regulation:    If discussed at Long Length of Stay Meetings, dates discussed:    Comments:  04/17/2014 [facility] notified of plan to d/c and pt choice of [facility] for [facility] services.  CRoyal RN MPH, case manager, [phone]

## 2014-04-17 NOTE — Discharge Summary (Signed)
Physician Discharge Summary  Carol Page [id number] DOB: [birth year] DOA: 04/16/2014  PCP: Webb Silversmith, NP  Admit date: 04/16/2014 Discharge date: 04/17/2014  Time spent:   Recommendations for Outpatient Follow-up:  1. Home PT arranged 2. Monitor blood pressure  Discharge Diagnoses:  Principal Problem:   ARF (acute renal failure) Active Problems:   Unspecified essential hypertension   Severe obesity (BMI >= 40)   Seizures   Anxiety and depression   Weakness hypotension  Discharge Condition:   Filed Weights   04/16/14 2100  Weight: 107.4 kg (236 lb 12.4 oz)    History of present illness:  78 y.o. female with PMH of HTN, remote h/o CVA, depression, hyperlipidemia who is brought to the hospital by her daughter. The daughter found her in bed this morning and tried to get her out of bed. The patient did not have the strength to sit up on her own and was quite weak once assisted into a sitting position, per daughter She was not able to get out of bed and walk and this is not her baseline. The daughter noted that the patient's bedside commode was filled with stool and there was more stool on the outside of the commode and on the floor. No blood noted in the stool. The patient did not recall waking up at night to have a BM and it is not normal for her to go on the floor. She has no c/o abdominal pain, vomiting or fever. She has not had any further stools. She is being admitted for severe generalized weakness, hypotension and acute renal failure.    Hospital Course:  Hydrated overnight. Acute renal failure resolved. Antihypertensives held. Worked with PT. By discharge, back to baseline. Recommend stopping thiazide indefinately. May start CCB and ACE I back sequentially if blood pressure not low. Patient has home blood pressure monitor  Procedures:  none  Consultations:  none  Discharge Exam: Filed Vitals:   04/17/14 1037  BP: 124/45  Pulse: 77  Temp: 98.5 F (36.9 C)   Resp: 18    General: alert, oriented, comfortable Cardiovascular: RRR Respiratory: CTA Abd: s, nt, nd Ext no CCE  Discharge Instructions   Discharge instructions    Complete by:  As directed   Stop microzide. Hold lisinopril for 2-3 days, then resume if blood pressure readings not low     Walk with assistance    Complete by:  As directed           Current Discharge Medication List    CONTINUE these medications which have CHANGED   Details  lisinopril (PRINIVIL,ZESTRIL) 5 MG tablet Take 1 tablet (5 mg total) by mouth daily. Hold for 2-3 days, then resume if blood pressure readings not low Qty: 30 tablet, Refills: 5   Associated Diagnoses: HTN (hypertension)      CONTINUE these medications which have NOT CHANGED   Details  amitriptyline (ELAVIL) 25 MG tablet Take 1 tablet (25 mg total) by mouth at bedtime. Qty: 30 tablet, Refills: 2   Associated Diagnoses: Stroke    atorvastatin (LIPITOR) 80 MG tablet Take 1 tablet (80 mg total) by mouth daily. Qty: 90 tablet, Refills: 1   Associated Diagnoses: HLD (hyperlipidemia)    bisacodyl (DULCOLAX) 5 MG EC tablet Take 5 mg by mouth daily as needed for moderate constipation.    Cholecalciferol 2000 UNITS TABS Take 2,000 Units by mouth every morning.     clopidogrel (PLAVIX) 75 MG tablet Take 1 tablet (75 mg total) by mouth every  evening. Qty: 30 tablet, Refills: 2   Associated Diagnoses: Stroke    diltiazem (CARDIZEM CD) 180 MG 24 hr capsule Take 1 capsule (180 mg total) by mouth daily. Qty: 30 capsule, Refills: 5    diphenhydramine-acetaminophen (TYLENOL PM) 25-500 MG TABS Take 1 tablet by mouth 2 (two) times daily.     docusate sodium (COLACE) 100 MG capsule Take 100 mg by mouth 2 (two) times daily.    ferrous sulfate 325 (65 FE) MG EC tablet Take 325 mg by mouth daily with breakfast.     gabapentin (NEURONTIN) 300 MG capsule Take 3 capsules (900 mg total) by mouth 3 (three) times daily. Takes 600mg  in morning, 300mg  at  noon and 600mg  at bedtime Qty: 90 capsule, Refills: 5    LORazepam (ATIVAN) 1 MG tablet Take 1 mg by mouth daily as needed.     phenytoin (DILANTIN) 100 MG ER capsule Take 2 capsules (200 mg total) by mouth 2 (two) times daily. Qty: 120 capsule, Refills: 5   Associated Diagnoses: Seizures    sertraline (ZOLOFT) 50 MG tablet Take 1 tablet (50 mg total) by mouth daily. Qty: 30 tablet, Refills: 3   Associated Diagnoses: Anxiety and depression    traZODone (DESYREL) 50 MG tablet Take 3 tablets (150 mg total) by mouth at bedtime. Qty: 90 tablet, Refills: 5   Associated Diagnoses: Anxiety and depression    vitamin B-12 (CYANOCOBALAMIN) 1000 MCG tablet Take 1,000 mcg by mouth daily.    nitroGLYCERIN (NITROSTAT) 0.4 MG SL tablet Place 1 tablet (0.4 mg total) under the tongue every 5 (five) minutes as needed for chest pain. Qty: 30 tablet, Refills: 0   Associated Diagnoses: HTN (hypertension)    oxybutynin (DITROPAN) 5 MG tablet Take 1 tablet (5 mg total) by mouth 3 (three) times daily. Qty: 90 tablet, Refills: 3      STOP taking these medications     hydrochlorothiazide (MICROZIDE) 12.5 MG capsule        Allergies  Allergen Reactions  . Codeine Nausea And Vomiting  . Ether Nausea And Vomiting   Follow-up Information   Follow up with BAITY, REGINA, NP In 2 weeks. (to check blood pressure)    Specialty:  Internal Medicine   Contact information:   [address] [place] [address] [phone]        The results of significant diagnostics from this hospitalization (including imaging, microbiology, ancillary and laboratory) are listed below for reference.    Significant Diagnostic Studies: Ct Head Wo Contrast  04/16/2014   CLINICAL DATA:  Altered mental status and weakness with history of previous CVA and seizures.  EXAM: CT HEAD WITHOUT CONTRAST  TECHNIQUE: Contiguous axial images were obtained from the base of the skull through the vertex without intravenous contrast.   COMPARISON:  None.  FINDINGS: There is mild diffuse cerebral and cerebellar atrophy with compensatory ventriculomegaly. There is decreased density in the deep white matter of both cerebral hemispheres consistent with chronic small vessel ischemia. There is no acute intracranial hemorrhage nor evidence of acute ischemic change. The cerebellum and brainstem exhibit no acute abnormalities.  The observed paranasal sinuses exhibit mucoperiosteal thickening within ethmoid and right maxillary sinuses. There is no lytic or blastic skull lesion.  IMPRESSION: 1. There is no acute intracranial hemorrhage nor acute ischemic change. There is no intracranial mass effect nor hydrocephalus. 2. There are age related changes of chronic small vessel ischemia and mild diffuse atrophy.   Electronically Signed   By: Shanon Brow  [place]   On: 04/16/2014 11:57   Dg Chest Port 1 View  04/16/2014   CLINICAL DATA:  Increased breath sounds at the bases  EXAM: PORTABLE CHEST - 1 VIEW  COMPARISON:  05/22/2013  FINDINGS: Cardiac shadow is stable. The lungs are well aerated bilaterally. Mild right basilar atelectatic changes are noted. No focal confluent infiltrate is seen. No bony abnormality is noted.  IMPRESSION: Mild right basilar atelectasis.   Electronically Signed   By: Inez Catalina M.D.   On: 04/16/2014 16:48    Microbiology: No results found for this or any previous visit (from the past 240 hour(s)).   Labs: Basic Metabolic Panel:  Recent Labs Lab 04/16/14 1117 04/16/14 1547 04/17/14 0626  NA 136*  --  137  K 4.5  --  3.9  CL 97  --  102  CO2 25  --  24  GLUCOSE 132*  --  122*  BUN 27*  --  23  CREATININE 1.24* 1.15* 0.86  CALCIUM 9.3  --  8.0*   Liver Function Tests:  Recent Labs Lab 04/16/14 1117  AST 37  ALT 58*  ALKPHOS 75  BILITOT 0.4  PROT 6.9  ALBUMIN 3.7   No results found for this basename: LIPASE, AMYLASE,  in the last 168 hours No results found for this basename: AMMONIA,  in the last 168  hours CBC:  Recent Labs Lab 04/16/14 1117 04/16/14 1301 04/16/14 1547 04/17/14 0626  WBC 11.8*  --  12.9* 8.3  NEUTROABS 9.7*  --   --   --   HGB 4.8* 14.3 14.2 12.2  HCT 19.0* 42.6 42.1 36.6  MCV 101.6*  --  100.2* 100.3*  PLT PLATELETS APPEAR DECREASED  --  173 142*   Cardiac Enzymes: No results found for this basename: CKTOTAL, CKMB, CKMBINDEX, TROPONINI,  in the last 168 hours BNP: BNP (last 3 results) No results found for this basename: PROBNP,  in the last 8760 hours CBG: No results found for this basename: GLUCAP,  in the last 168 hours     Signed:  Clemson L  [facility] 04/17/2014, 1:01 PM

## 2014-04-19 ENCOUNTER — Other Ambulatory Visit: Payer: Self-pay

## 2014-04-19 DIAGNOSIS — R32 Unspecified urinary incontinence: Secondary | ICD-10-CM | POA: Diagnosis not present

## 2014-04-19 DIAGNOSIS — I10 Essential (primary) hypertension: Secondary | ICD-10-CM | POA: Diagnosis not present

## 2014-04-19 DIAGNOSIS — Z8673 Personal history of transient ischemic attack (TIA), and cerebral infarction without residual deficits: Secondary | ICD-10-CM | POA: Diagnosis not present

## 2014-04-19 DIAGNOSIS — F419 Anxiety disorder, unspecified: Secondary | ICD-10-CM | POA: Diagnosis not present

## 2014-04-19 DIAGNOSIS — E785 Hyperlipidemia, unspecified: Secondary | ICD-10-CM | POA: Diagnosis not present

## 2014-04-19 DIAGNOSIS — M6281 Muscle weakness (generalized): Secondary | ICD-10-CM | POA: Diagnosis not present

## 2014-04-19 NOTE — Telephone Encounter (Signed)
Last filled 03/09/14--please advise

## 2014-04-20 MED ORDER — LORAZEPAM 1 MG PO TABS: 1.0000 mg | ORAL_TABLET | Freq: Every day | ORAL | Status: DC | PRN

## 2014-04-20 NOTE — Telephone Encounter (Signed)
i believe at [place] pt's daughter stated she will let us know what was needed--called pharmacy and they state the medication was last called in and filled 03/09/14--please advise

## 2014-04-20 NOTE — Telephone Encounter (Signed)
Was this not filled 9/21. May need to call pharmacy to verify

## 2014-04-20 NOTE — Telephone Encounter (Signed)
Ok to phone in.

## 2014-04-20 NOTE — Telephone Encounter (Signed)
Rx called in to pharmacy. 

## 2014-04-25 DIAGNOSIS — I10 Essential (primary) hypertension: Secondary | ICD-10-CM | POA: Diagnosis not present

## 2014-04-25 DIAGNOSIS — M6281 Muscle weakness (generalized): Secondary | ICD-10-CM | POA: Diagnosis not present

## 2014-04-25 DIAGNOSIS — R32 Unspecified urinary incontinence: Secondary | ICD-10-CM | POA: Diagnosis not present

## 2014-04-25 DIAGNOSIS — F419 Anxiety disorder, unspecified: Secondary | ICD-10-CM | POA: Diagnosis not present

## 2014-04-25 DIAGNOSIS — Z8673 Personal history of transient ischemic attack (TIA), and cerebral infarction without residual deficits: Secondary | ICD-10-CM | POA: Diagnosis not present

## 2014-04-25 DIAGNOSIS — E785 Hyperlipidemia, unspecified: Secondary | ICD-10-CM | POA: Diagnosis not present

## 2014-04-27 DIAGNOSIS — M6281 Muscle weakness (generalized): Secondary | ICD-10-CM | POA: Diagnosis not present

## 2014-04-27 DIAGNOSIS — F419 Anxiety disorder, unspecified: Secondary | ICD-10-CM | POA: Diagnosis not present

## 2014-04-27 DIAGNOSIS — Z8673 Personal history of transient ischemic attack (TIA), and cerebral infarction without residual deficits: Secondary | ICD-10-CM | POA: Diagnosis not present

## 2014-04-27 DIAGNOSIS — E785 Hyperlipidemia, unspecified: Secondary | ICD-10-CM | POA: Diagnosis not present

## 2014-04-27 DIAGNOSIS — R32 Unspecified urinary incontinence: Secondary | ICD-10-CM | POA: Diagnosis not present

## 2014-04-27 DIAGNOSIS — I10 Essential (primary) hypertension: Secondary | ICD-10-CM | POA: Diagnosis not present

## 2014-04-30 ENCOUNTER — Ambulatory Visit (INDEPENDENT_AMBULATORY_CARE_PROVIDER_SITE_OTHER): Payer: Medicare Other | Admitting: Internal Medicine

## 2014-04-30 ENCOUNTER — Encounter: Payer: Self-pay | Admitting: Internal Medicine

## 2014-04-30 ENCOUNTER — Other Ambulatory Visit: Payer: Self-pay | Admitting: Internal Medicine

## 2014-04-30 VITALS — BP 168/90 | HR 96 | Temp 98.4°F | Wt 230.0 lb

## 2014-04-30 DIAGNOSIS — R531 Weakness: Secondary | ICD-10-CM | POA: Diagnosis not present

## 2014-04-30 DIAGNOSIS — I10 Essential (primary) hypertension: Secondary | ICD-10-CM

## 2014-04-30 DIAGNOSIS — I639 Cerebral infarction, unspecified: Secondary | ICD-10-CM

## 2014-04-30 DIAGNOSIS — E860 Dehydration: Secondary | ICD-10-CM | POA: Diagnosis not present

## 2014-04-30 LAB — COMPREHENSIVE METABOLIC PANEL
ALBUMIN: 3.6 g/dL (ref 3.5–5.2)
ALK PHOS: 64 U/L (ref 39–117)
ALT: 36 U/L — AB (ref 0–35)
AST: 30 U/L (ref 0–37)
BILIRUBIN TOTAL: 0.4 mg/dL (ref 0.2–1.2)
BUN: 17 mg/dL (ref 6–23)
CO2: 26 mEq/L (ref 19–32)
Calcium: 9.2 mg/dL (ref 8.4–10.5)
Chloride: 100 mEq/L (ref 96–112)
Creatinine, Ser: 0.9 mg/dL (ref 0.4–1.2)
GFR: 66.29 mL/min (ref >60.00)
GLUCOSE: 91 mg/dL (ref 70–99)
POTASSIUM: 3.8 meq/L (ref 3.5–5.1)
SODIUM: 134 meq/L — AB (ref 135–145)
TOTAL PROTEIN: 7.7 g/dL (ref 6.0–8.3)

## 2014-04-30 MED ORDER — GABAPENTIN 300 MG PO CAPS: ORAL_CAPSULE | ORAL | Status: DC

## 2014-04-30 MED ORDER — HYDROCHLOROTHIAZIDE 12.5 MG PO CAPS: 12.5000 mg | ORAL_CAPSULE | Freq: Every day | ORAL | Status: DC

## 2014-04-30 NOTE — Progress Notes (Signed)
Subjective:    Patient ID: Carol Page, female    DOB: 04-Dec-1935, 78 y.o.   MRN: [id number]  HPI  Pt presents to the clinic today for hospital follow up. She went to the ER 04/16/14 with weakness. She was admitted for hypotension, dehydration, and acute renal failure. She was admitted, hydrated and blood pressure medications were held. Her microzide was stopped, and prior to discharge, she was started back on her lisinopril and cardizem. Since her hospital admission, she has felt wonderful. Her daughter reports that she is acting normal. Her blood pressure is elevated today.  Review of Systems      Past Medical History  Diagnosis Date  . Stroke   . Hypertension   . Hyperlipidemia   . Arthritis   . Depression   . Ulcer   . History of blood transfusion   . Urinary incontinence   . Allergy   . Skin cancer of nose   . Cellulitis of left leg   . Pre-diabetes     Current Outpatient Prescriptions  Medication Sig Dispense Refill  . amitriptyline (ELAVIL) 25 MG tablet Take 1 tablet (25 mg total) by mouth at bedtime.  30 tablet  2  . atorvastatin (LIPITOR) 80 MG tablet Take 1 tablet (80 mg total) by mouth daily.  90 tablet  1  . bisacodyl (DULCOLAX) 5 MG EC tablet Take 5 mg by mouth daily as needed for moderate constipation.      . Cholecalciferol 2000 UNITS TABS Take 2,000 Units by mouth every morning.       . clopidogrel (PLAVIX) 75 MG tablet Take 1 tablet (75 mg total) by mouth every evening.  30 tablet  2  . diltiazem (CARDIZEM CD) 180 MG 24 hr capsule Take 1 capsule (180 mg total) by mouth daily.  30 capsule  5  . diphenhydramine-acetaminophen (TYLENOL PM) 25-500 MG TABS Take 1 tablet by mouth 2 (two) times daily.       Marland Kitchen docusate sodium (COLACE) 100 MG capsule Take 100 mg by mouth 2 (two) times daily.      . ferrous sulfate 325 (65 FE) MG EC tablet Take 325 mg by mouth daily with breakfast.       . gabapentin (NEURONTIN) 300 MG capsule Take 3 capsules (900 mg total) by mouth 3  (three) times daily. Takes 600mg  in morning, 300mg  at noon and 600mg  at bedtime  90 capsule  5  . lisinopril (PRINIVIL,ZESTRIL) 5 MG tablet Take 1 tablet (5 mg total) by mouth daily. Hold for 2-3 days, then resume if blood pressure readings not low  30 tablet  5  . LORazepam (ATIVAN) 1 MG tablet Take 1 tablet (1 mg total) by mouth daily as needed.  30 tablet  0  . nitroGLYCERIN (NITROSTAT) 0.4 MG SL tablet Place 1 tablet (0.4 mg total) under the tongue every 5 (five) minutes as needed for chest pain.  30 tablet  0  . oxybutynin (DITROPAN) 5 MG tablet Take 1 tablet (5 mg total) by mouth 3 (three) times daily.  90 tablet  3  . phenytoin (DILANTIN) 100 MG ER capsule Take 2 capsules (200 mg total) by mouth 2 (two) times daily.  120 capsule  5  . sertraline (ZOLOFT) 50 MG tablet Take 1 tablet (50 mg total) by mouth daily.  30 tablet  3  . traZODone (DESYREL) 50 MG tablet Take 3 tablets (150 mg total) by mouth at bedtime.  90 tablet  5  . vitamin B-12 (  CYANOCOBALAMIN) 1000 MCG tablet Take 1,000 mcg by mouth daily.       No current facility-administered medications for this visit.    Allergies  Allergen Reactions  . Codeine Nausea And Vomiting  . Ether Nausea And Vomiting    Family History  Problem Relation Age of Onset  . Heart disease Other     Parent  . Cancer Maternal Uncle     History   Social History  . Marital Status: Widowed    Spouse Name: N/A    Number of Children: 4  . Years of Education: 12   Occupational History  . Retired    Social History Main Topics  . Smoking status: Never Smoker   . Smokeless tobacco: Never Used  . Alcohol Use: No  . Drug Use: No  . Sexual Activity: No   Other Topics Concern  . Not on file   Social History Narrative   Regular exercise-no   Caffeine Use-yes     Constitutional: Denies fever, malaise, fatigue, headache or abrupt weight changes.  Respiratory: Denies difficulty breathing, shortness of breath, cough or sputum production.     Cardiovascular: Denies chest pain, chest tightness, palpitations or swelling in the hands or feet.  Neurological: Denies dizziness, difficulty with memory, difficulty with speech or problems.   No other specific complaints in a complete review of systems (except as listed in HPI above).  Objective:   Physical Exam   BP 168/90  Pulse 96  Temp(Src) 98.4 F (36.9 C) (Oral)  Wt 230 lb (104.327 kg)  SpO2 97% Wt Readings from Last 3 Encounters:  04/30/14 230 lb (104.327 kg)  04/16/14 236 lb 12.4 oz (107.4 kg)  04/09/14 216 lb (97.977 kg)    General: Appears her stated age, obese but well developed, well nourished in NAD. Skin: Warm, dry and intact. No rashes, lesions or ulcerations noted. Cardiovascular: Normal rate and rhythm. S1,S2 noted.  No murmur, rubs or gallops noted. No JVD. 2+ pitting BLE edema noted. No carotid bruits noted. Pulmonary/Chest: Normal effort and positive vesicular breath sounds. No respiratory distress. No wheezes, rales or ronchi noted.  Neurological: Alert and oriented.   BMET    Component Value Date/Time   NA 137 04/17/2014 0626   K 3.9 04/17/2014 0626   CL 102 04/17/2014 0626   CO2 24 04/17/2014 0626   GLUCOSE 122* 04/17/2014 0626   BUN 23 04/17/2014 0626   CREATININE 0.86 04/17/2014 0626   CALCIUM 8.0* 04/17/2014 0626   GFRNONAA 64* 04/17/2014 0626   GFRAA 74* 04/17/2014 0626    Lipid Panel     Component Value Date/Time   CHOL 148 10/05/2013 1136   TRIG 162.0* 10/05/2013 1136   HDL 49.80 10/05/2013 1136   CHOLHDL 3 10/05/2013 1136   VLDL 32.4 10/05/2013 1136   LDLCALC 66 10/05/2013 1136    CBC    Component Value Date/Time   WBC 8.3 04/17/2014 0626   RBC 3.65* 04/17/2014 0626   HGB 12.2 04/17/2014 0626   HCT 36.6 04/17/2014 0626   PLT 142* 04/17/2014 0626   MCV 100.3* 04/17/2014 0626   MCH 33.4 04/17/2014 0626   MCHC 33.3 04/17/2014 0626   RDW 12.7 04/17/2014 0626   LYMPHSABS 1.0 04/16/2014 1117   MONOABS 1.1* 04/16/2014 1117   EOSABS 0.0 04/16/2014 1117    BASOSABS 0.0 04/16/2014 1117    Hgb A1C Lab Results  Component Value Date   HGBA1C 5.7 11/09/2012        Assessment &  Plan:   Hospital follow up for hypotension and dehydration:  Hospital notes, labs, imaging reviewed Continue lisinopril Check CMET today If labs normal- will restart Microzide  RTC in 1 month to repeat BMET and check BP

## 2014-04-30 NOTE — Progress Notes (Signed)
Pre visit review using our clinic review tool, if applicable. No additional management support is needed unless otherwise documented below in the visit note. 

## 2014-04-30 NOTE — Patient Instructions (Signed)
Hypotension  As your heart beats, it forces blood through your arteries. This force is your blood pressure. If your blood pressure is too low for you to go about your normal activities or to support the organs of your body, you have hypotension. Hypotension is also referred to as low blood pressure. When your blood pressure becomes too low, you may not get enough blood to your brain. As a result, you may feel weak, feel lightheaded, or develop a rapid heart rate. In a more severe case, you may faint.  CAUSES  Various conditions can cause hypotension. These include:  · Blood loss.  · Dehydration.  · Heart or endocrine problems.  · Pregnancy.  · Severe infection.  · Not having a well-balanced diet filled with needed nutrients.  · Severe allergic reactions (anaphylaxis).  Some medicines, such as blood pressure medicine or water pills (diuretics), may lower your blood pressure below normal. Sometimes taking too much medicine or taking medicine not as directed can cause hypotension.  TREATMENT   Hospitalization is sometimes required for hypotension if fluid or blood replacement is needed, if time is needed for medicines to wear off, or if further monitoring is needed. Treatment might include changing your diet, changing your medicines (including medicines aimed at raising your blood pressure), and use of support stockings.  HOME CARE INSTRUCTIONS   · Drink enough fluids to keep your urine clear or pale yellow.  · Take your medicines as directed by your health care provider.  · Get up slowly from reclining or sitting positions. This gives your blood pressure a chance to adjust.  · Wear support stockings as directed by your health care provider.  · Maintain a healthy diet by including nutritious food, such as fruits, vegetables, nuts, whole grains, and lean meats.  SEEK MEDICAL CARE IF:  · You have vomiting or diarrhea.  · You have a fever for more than 2-3 days.  · You feel more thirsty than usual.  · You feel weak and  tired.  SEEK IMMEDIATE MEDICAL CARE IF:   · You have chest pain or a fast or irregular heartbeat.  · You have a loss of feeling in some part of your body, or you lose movement in your arms or legs.  · You have trouble speaking.  · You become sweaty or feel lightheaded.  · You faint.  MAKE SURE YOU:   · Understand these instructions.  · Will watch your condition.  · Will get help right away if you are not doing well or get worse.  Document Released: 07/06/2005 Document Revised: 04/26/2013 Document Reviewed: 01/06/2013  [employer]® Patient Information ©2015 [employer]. This information is not intended to replace advice given to you by your health care provider. Make sure you discuss any questions you have with your health care provider.

## 2014-05-01 ENCOUNTER — Telehealth: Payer: Self-pay | Admitting: Internal Medicine

## 2014-05-01 DIAGNOSIS — I10 Essential (primary) hypertension: Secondary | ICD-10-CM | POA: Diagnosis not present

## 2014-05-01 DIAGNOSIS — E785 Hyperlipidemia, unspecified: Secondary | ICD-10-CM | POA: Diagnosis not present

## 2014-05-01 DIAGNOSIS — Z8673 Personal history of transient ischemic attack (TIA), and cerebral infarction without residual deficits: Secondary | ICD-10-CM | POA: Diagnosis not present

## 2014-05-01 DIAGNOSIS — F419 Anxiety disorder, unspecified: Secondary | ICD-10-CM | POA: Diagnosis not present

## 2014-05-01 DIAGNOSIS — M6281 Muscle weakness (generalized): Secondary | ICD-10-CM | POA: Diagnosis not present

## 2014-05-01 DIAGNOSIS — R32 Unspecified urinary incontinence: Secondary | ICD-10-CM | POA: Diagnosis not present

## 2014-05-01 NOTE — Telephone Encounter (Signed)
emmi mailed  °

## 2014-05-08 DIAGNOSIS — M6281 Muscle weakness (generalized): Secondary | ICD-10-CM | POA: Diagnosis not present

## 2014-05-08 DIAGNOSIS — E785 Hyperlipidemia, unspecified: Secondary | ICD-10-CM | POA: Diagnosis not present

## 2014-05-08 DIAGNOSIS — F419 Anxiety disorder, unspecified: Secondary | ICD-10-CM | POA: Diagnosis not present

## 2014-05-08 DIAGNOSIS — Z8673 Personal history of transient ischemic attack (TIA), and cerebral infarction without residual deficits: Secondary | ICD-10-CM | POA: Diagnosis not present

## 2014-05-08 DIAGNOSIS — R32 Unspecified urinary incontinence: Secondary | ICD-10-CM | POA: Diagnosis not present

## 2014-05-08 DIAGNOSIS — I10 Essential (primary) hypertension: Secondary | ICD-10-CM | POA: Diagnosis not present

## 2014-05-31 ENCOUNTER — Encounter: Payer: Self-pay | Admitting: Internal Medicine

## 2014-05-31 ENCOUNTER — Ambulatory Visit (INDEPENDENT_AMBULATORY_CARE_PROVIDER_SITE_OTHER): Payer: Medicare Other | Admitting: Internal Medicine

## 2014-05-31 VITALS — BP 146/74 | HR 85 | Temp 98.4°F | Wt 214.0 lb

## 2014-05-31 DIAGNOSIS — I10 Essential (primary) hypertension: Secondary | ICD-10-CM | POA: Diagnosis not present

## 2014-05-31 DIAGNOSIS — I639 Cerebral infarction, unspecified: Secondary | ICD-10-CM

## 2014-05-31 DIAGNOSIS — M79605 Pain in left leg: Secondary | ICD-10-CM

## 2014-05-31 LAB — BASIC METABOLIC PANEL
BUN: 10 mg/dL (ref 6–23)
CALCIUM: 9.1 mg/dL (ref 8.4–10.5)
CO2: 22 meq/L (ref 19–32)
Chloride: 104 mEq/L (ref 96–112)
Creatinine, Ser: 0.8 mg/dL (ref 0.4–1.2)
GFR: 72.93 mL/min (ref >60.00)
GLUCOSE: 120 mg/dL — AB (ref 70–99)
Potassium: 3.8 mEq/L (ref 3.5–5.1)
SODIUM: 138 meq/L (ref 135–145)

## 2014-05-31 NOTE — Assessment & Plan Note (Signed)
BP elevated today She is on lisinopril and HCTZ Will repeat BMET today- if normal increase lisinopril to 10 mg daily ? New onset murmur Will discuss with daughter about getting ECHO

## 2014-05-31 NOTE — Addendum Note (Signed)
Addended by: Jearld Fenton on: 05/31/2014 10:25 AM   Modules accepted: Miquel Dunn

## 2014-05-31 NOTE — Progress Notes (Signed)
Subjective:    Patient ID: Carol Page, female    DOB: 29-Jun-1936, 78 y.o.   MRN: [id number]  HPI  Pt presents to the clinic today for 1 month follow up of blood pressure. She was recently hospitalized for weakness and low blood pressure. At the hospital, they stopped her microzide d/t ARF but kept her on lisinopril and cardizem. At her follow up visit, her blood pressure was normal but she c/o edema in her lower extremities that caused her pain. BMET was checked which showed normal kidney function. Her microzide was restarted. Since she restarted the microzide. She did start having large, loose BM's just like she did prior to her last hospital visit. The stool was normal in color. She has not noticed any blood in her stool. Her daughter reports that her blood pressure runs about 140/70 at home. She has lost 16 lbs since her last followup.  She does continue to c/o pain in her left lower leg. Her Gabapentin was increased at her last visit but she has not noticed any difference. She reports the pain is worse with cold weather. It feels like a muscle cramp. She has no pain in the knee or the ankle. It actually improves with walking. She has not tried anything else OTC.    Review of Systems      Past Medical History  Diagnosis Date  . Stroke   . Hypertension   . Hyperlipidemia   . Arthritis   . Depression   . Ulcer   . History of blood transfusion   . Urinary incontinence   . Allergy   . Skin cancer of nose   . Cellulitis of left leg   . Pre-diabetes     Current Outpatient Prescriptions  Medication Sig Dispense Refill  . amitriptyline (ELAVIL) 25 MG tablet Take 1 tablet (25 mg total) by mouth at bedtime. 30 tablet 2  . atorvastatin (LIPITOR) 80 MG tablet Take 1 tablet (80 mg total) by mouth daily. 90 tablet 1  . bisacodyl (DULCOLAX) 5 MG EC tablet Take 5 mg by mouth daily as needed for moderate constipation.    . Cholecalciferol 2000 UNITS TABS Take 2,000 Units by mouth every  morning.     . clopidogrel (PLAVIX) 75 MG tablet Take 1 tablet (75 mg total) by mouth every evening. 30 tablet 2  . diltiazem (CARDIZEM CD) 180 MG 24 hr capsule Take 1 capsule (180 mg total) by mouth daily. 30 capsule 5  . diphenhydramine-acetaminophen (TYLENOL PM) 25-500 MG TABS Take 1 tablet by mouth 2 (two) times daily.     Marland Kitchen docusate sodium (COLACE) 100 MG capsule Take 100 mg by mouth 2 (two) times daily.    . ferrous sulfate 325 (65 FE) MG EC tablet Take 325 mg by mouth daily with breakfast.     . gabapentin (NEURONTIN) 300 MG capsule Takes 900 mg in the am, 600 mg in the afternoon, 600 mg QHS 210 capsule 2  . hydrochlorothiazide (MICROZIDE) 12.5 MG capsule Take 1 capsule (12.5 mg total) by mouth daily. 30 capsule 0  . lisinopril (PRINIVIL,ZESTRIL) 5 MG tablet Take 1 tablet (5 mg total) by mouth daily. Hold for 2-3 days, then resume if blood pressure readings not low 30 tablet 5  . LORazepam (ATIVAN) 1 MG tablet Take 1 tablet (1 mg total) by mouth daily as needed. 30 tablet 0  . nitroGLYCERIN (NITROSTAT) 0.4 MG SL tablet Place 1 tablet (0.4 mg total) under the tongue every 5 (five)  minutes as needed for chest pain. 30 tablet 0  . oxybutynin (DITROPAN) 5 MG tablet Take 1 tablet (5 mg total) by mouth 3 (three) times daily. 90 tablet 3  . phenytoin (DILANTIN) 100 MG ER capsule Take 2 capsules (200 mg total) by mouth 2 (two) times daily. 120 capsule 5  . sertraline (ZOLOFT) 50 MG tablet Take 1 tablet (50 mg total) by mouth daily. 30 tablet 3  . traZODone (DESYREL) 50 MG tablet Take 3 tablets (150 mg total) by mouth at bedtime. 90 tablet 5  . vitamin B-12 (CYANOCOBALAMIN) 1000 MCG tablet Take 1,000 mcg by mouth daily.     No current facility-administered medications for this visit.    Allergies  Allergen Reactions  . Codeine Nausea And Vomiting  . Ether Nausea And Vomiting    Family History  Problem Relation Age of Onset  . Heart disease Other     Parent  . Cancer Maternal Uncle      History   Social History  . Marital Status: Widowed    Spouse Name: N/A    Number of Children: 4  . Years of Education: 12   Occupational History  . Retired    Social History Main Topics  . Smoking status: Never Smoker   . Smokeless tobacco: Never Used  . Alcohol Use: No  . Drug Use: No  . Sexual Activity: No   Other Topics Concern  . Not on file   Social History Narrative   Regular exercise-no   Caffeine Use-yes     Constitutional: Denies fever, malaise, fatigue, headache or abrupt weight changes.  Respiratory: Denies difficulty breathing, shortness of breath, cough or sputum production.   Cardiovascular: Pt reports BLE edema. Denies chest pain, chest tightness, palpitations or swelling in the hands.  Gastrointestinal: Pt reports diarrhea. Denies bloating, constipation, diarrhea or blood in the stool.  Musculoskeletal: Pt reports left leg pain. Denies decrease in range of motion, difficulty with gaitor joint pain and swelling.     No other specific complaints in a complete review of systems (except as listed in HPI above).  Objective:   Physical Exam  BP 146/74 mmHg  Pulse 85  Temp(Src) 98.4 F (36.9 C) (Oral)  Wt 214 lb (97.07 kg)  SpO2 98% Wt Readings from Last 3 Encounters:  05/31/14 214 lb (97.07 kg)  04/30/14 230 lb (104.327 kg)  04/16/14 236 lb 12.4 oz (107.4 kg)    General: Appears her stated age, obese but well developed, well nourished in NAD.  Cardiovascular: Normal rate and rhythm. S1,S2 noted. Murmur noted. No rubs or gallops noted. No JVD. 1+  BLE edema.  Pulmonary/Chest: Normal effort and positive vesicular breath sounds. No respiratory distress. No wheezes, rales or ronchi noted.  Abdomen: Soft and nontender. Normal bowel sounds, no bruits noted. No distention or masses noted. Liver, spleen and kidneys non palpable. Musculoskeletal: Normal flexion and extension of the knee. No pain with palpation of the left lower leg. Uses walker for  assitance with gait.    BMET    Component Value Date/Time   NA 134* 04/30/2014 1349   K 3.8 04/30/2014 1349   CL 100 04/30/2014 1349   CO2 26 04/30/2014 1349   GLUCOSE 91 04/30/2014 1349   BUN 17 04/30/2014 1349   CREATININE 0.9 04/30/2014 1349   CALCIUM 9.2 04/30/2014 1349   GFRNONAA 64* 04/17/2014 0626   GFRAA 74* 04/17/2014 0626    Lipid Panel     Component Value Date/Time  CHOL 148 10/05/2013 1136   TRIG 162.0* 10/05/2013 1136   HDL 49.80 10/05/2013 1136   CHOLHDL 3 10/05/2013 1136   VLDL 32.4 10/05/2013 1136   LDLCALC 66 10/05/2013 1136    CBC    Component Value Date/Time   WBC 8.3 04/17/2014 0626   RBC 3.65* 04/17/2014 0626   HGB 12.2 04/17/2014 0626   HCT 36.6 04/17/2014 0626   PLT 142* 04/17/2014 0626   MCV 100.3* 04/17/2014 0626   MCH 33.4 04/17/2014 0626   MCHC 33.3 04/17/2014 0626   RDW 12.7 04/17/2014 0626   LYMPHSABS 1.0 04/16/2014 1117   MONOABS 1.1* 04/16/2014 1117   EOSABS 0.0 04/16/2014 1117   BASOSABS 0.0 04/16/2014 1117    Hgb A1C Lab Results  Component Value Date   HGBA1C 5.7 11/09/2012         Assessment & Plan:   Left leg pain:  Sounds like spasm Try aspercream rub/Heat Massage may be beneficial as well  RTC in3 months for your next routine appt

## 2014-05-31 NOTE — Patient Instructions (Signed)

## 2014-05-31 NOTE — Progress Notes (Signed)
Pre visit review using our clinic review tool, if applicable. No additional management support is needed unless otherwise documented below in the visit note. 

## 2014-06-08 ENCOUNTER — Other Ambulatory Visit: Payer: Self-pay | Admitting: Internal Medicine

## 2014-06-08 MED ORDER — LISINOPRIL 10 MG PO TABS: 10.0000 mg | ORAL_TABLET | Freq: Every day | ORAL | Status: DC

## 2014-07-10 ENCOUNTER — Other Ambulatory Visit: Payer: Self-pay | Admitting: Internal Medicine

## 2014-07-10 NOTE — Telephone Encounter (Signed)
Ativan last filled 04/20/2014--please advise

## 2014-07-10 NOTE — Telephone Encounter (Signed)
Please phone in [place]

## 2014-07-10 NOTE — Telephone Encounter (Signed)
Rx called in as directed.   

## 2014-08-08 ENCOUNTER — Other Ambulatory Visit: Payer: Self-pay | Admitting: Internal Medicine

## 2014-08-08 NOTE — Telephone Encounter (Signed)
Last filled 07/10/2014--please advise

## 2014-08-08 NOTE — Telephone Encounter (Signed)
Ok to phone in [place] to be filled on or after 1/22

## 2014-08-09 ENCOUNTER — Other Ambulatory Visit: Payer: Self-pay | Admitting: Internal Medicine

## 2014-08-09 NOTE — Telephone Encounter (Signed)
Rx called in to pharmacy. 

## 2014-08-28 ENCOUNTER — Other Ambulatory Visit: Payer: Self-pay | Admitting: Internal Medicine

## 2014-09-05 ENCOUNTER — Other Ambulatory Visit: Payer: Self-pay | Admitting: Internal Medicine

## 2014-09-10 ENCOUNTER — Other Ambulatory Visit: Payer: Self-pay | Admitting: Internal Medicine

## 2014-09-10 NOTE — Telephone Encounter (Signed)
Last filled 08/08/14--please advise

## 2014-09-11 NOTE — Telephone Encounter (Signed)
Rx called in to pharmacy. 

## 2014-09-11 NOTE — Telephone Encounter (Signed)
Ok to phone in Ativan 

## 2014-09-13 ENCOUNTER — Other Ambulatory Visit: Payer: Self-pay | Admitting: Internal Medicine

## 2014-09-26 ENCOUNTER — Other Ambulatory Visit: Payer: Self-pay | Admitting: Internal Medicine

## 2014-09-26 DIAGNOSIS — Z780 Asymptomatic menopausal state: Secondary | ICD-10-CM

## 2014-09-26 DIAGNOSIS — E785 Hyperlipidemia, unspecified: Secondary | ICD-10-CM

## 2014-09-26 DIAGNOSIS — I10 Essential (primary) hypertension: Secondary | ICD-10-CM

## 2014-09-26 DIAGNOSIS — R7301 Impaired fasting glucose: Secondary | ICD-10-CM

## 2014-09-28 ENCOUNTER — Other Ambulatory Visit: Payer: Self-pay

## 2014-09-28 MED ORDER — GABAPENTIN 300 MG PO CAPS: ORAL_CAPSULE | ORAL | Status: DC

## 2014-09-28 NOTE — Telephone Encounter (Signed)
Carol Page pts daughter left v/m requesting refill gabapentin to [facility] [place]; Carol Page said requested from [place] on 09/25/14. Pt only has enough med for today and Carol Page request to be refilled today.pt last seen 05/31/14.

## 2014-10-01 ENCOUNTER — Other Ambulatory Visit (INDEPENDENT_AMBULATORY_CARE_PROVIDER_SITE_OTHER): Payer: Medicare Other

## 2014-10-01 DIAGNOSIS — E785 Hyperlipidemia, unspecified: Secondary | ICD-10-CM

## 2014-10-01 DIAGNOSIS — I10 Essential (primary) hypertension: Secondary | ICD-10-CM | POA: Diagnosis not present

## 2014-10-01 DIAGNOSIS — R7301 Impaired fasting glucose: Secondary | ICD-10-CM

## 2014-10-01 LAB — COMPREHENSIVE METABOLIC PANEL
ALBUMIN: 4.1 g/dL (ref 3.5–5.2)
ALK PHOS: 68 U/L (ref 39–117)
ALT: 30 U/L (ref 0–35)
AST: 25 U/L (ref 0–37)
BILIRUBIN TOTAL: 0.3 mg/dL (ref 0.2–1.2)
BUN: 20 mg/dL (ref 6–23)
CO2: 27 mEq/L (ref 19–32)
CREATININE: 0.99 mg/dL (ref 0.40–1.20)
Calcium: 9.2 mg/dL (ref 8.4–10.5)
Chloride: 101 mEq/L (ref 96–112)
GFR: 57.8 mL/min — ABNORMAL LOW (ref >60.00)
GLUCOSE: 118 mg/dL — AB (ref 70–99)
Potassium: 3.7 mEq/L (ref 3.5–5.1)
Sodium: 135 mEq/L (ref 135–145)
Total Protein: 6.8 g/dL (ref 6.0–8.3)

## 2014-10-01 LAB — CBC
HEMATOCRIT: 37.2 % (ref 36.0–46.0)
Hemoglobin: 12.8 g/dL (ref 12.0–15.0)
MCHC: 34.4 g/dL (ref 30.0–36.0)
MCV: 97 fl (ref 78.0–100.0)
PLATELETS: 206 10*3/uL (ref 150.0–400.0)
RBC: 3.84 Mil/uL — AB (ref 3.87–5.11)
RDW: 13.2 % (ref 11.5–15.5)
WBC: 5.3 10*3/uL (ref 4.0–10.5)

## 2014-10-01 LAB — HEMOGLOBIN A1C: Hgb A1c MFr Bld: 5.5 % (ref 4.6–6.5)

## 2014-10-01 LAB — LIPID PANEL
CHOLESTEROL: 165 mg/dL (ref 0–200)
HDL: 49.4 mg/dL (ref >39.00)
LDL CALC: 91 mg/dL (ref 0–99)
NonHDL: 115.6
TRIGLYCERIDES: 123 mg/dL (ref 0.0–149.0)
Total CHOL/HDL Ratio: 3
VLDL: 24.6 mg/dL (ref 0.0–40.0)

## 2014-10-01 LAB — TSH: TSH: 5.66 u[IU]/mL — AB (ref 0.35–4.50)

## 2014-10-08 ENCOUNTER — Ambulatory Visit (INDEPENDENT_AMBULATORY_CARE_PROVIDER_SITE_OTHER): Payer: Medicare Other | Admitting: Internal Medicine

## 2014-10-08 ENCOUNTER — Encounter: Payer: Self-pay | Admitting: Internal Medicine

## 2014-10-08 VITALS — BP 138/70 | HR 78 | Temp 98.1°F | Wt 233.0 lb

## 2014-10-08 DIAGNOSIS — F329 Major depressive disorder, single episode, unspecified: Secondary | ICD-10-CM

## 2014-10-08 DIAGNOSIS — R569 Unspecified convulsions: Secondary | ICD-10-CM

## 2014-10-08 DIAGNOSIS — I10 Essential (primary) hypertension: Secondary | ICD-10-CM

## 2014-10-08 DIAGNOSIS — I639 Cerebral infarction, unspecified: Secondary | ICD-10-CM

## 2014-10-08 DIAGNOSIS — E785 Hyperlipidemia, unspecified: Secondary | ICD-10-CM | POA: Diagnosis not present

## 2014-10-08 DIAGNOSIS — R7309 Other abnormal glucose: Secondary | ICD-10-CM

## 2014-10-08 DIAGNOSIS — M79605 Pain in left leg: Secondary | ICD-10-CM

## 2014-10-08 DIAGNOSIS — R7303 Prediabetes: Secondary | ICD-10-CM

## 2014-10-08 DIAGNOSIS — Z0000 Encounter for general adult medical examination without abnormal findings: Secondary | ICD-10-CM | POA: Diagnosis not present

## 2014-10-08 DIAGNOSIS — M79602 Pain in left arm: Secondary | ICD-10-CM

## 2014-10-08 DIAGNOSIS — R32 Unspecified urinary incontinence: Secondary | ICD-10-CM

## 2014-10-08 DIAGNOSIS — F32A Depression, unspecified: Secondary | ICD-10-CM

## 2014-10-08 DIAGNOSIS — F418 Other specified anxiety disorders: Secondary | ICD-10-CM

## 2014-10-08 DIAGNOSIS — F419 Anxiety disorder, unspecified: Secondary | ICD-10-CM

## 2014-10-08 DIAGNOSIS — Z13820 Encounter for screening for osteoporosis: Secondary | ICD-10-CM

## 2014-10-08 DIAGNOSIS — Z79899 Other long term (current) drug therapy: Secondary | ICD-10-CM | POA: Diagnosis not present

## 2014-10-08 DIAGNOSIS — R296 Repeated falls: Secondary | ICD-10-CM

## 2014-10-08 DIAGNOSIS — G8929 Other chronic pain: Secondary | ICD-10-CM

## 2014-10-08 NOTE — Assessment & Plan Note (Signed)
A1C 5.5 % Will continue to monitor

## 2014-10-08 NOTE — Progress Notes (Signed)
Pre visit review using our clinic review tool, if applicable. No additional management support is needed unless otherwise documented below in the visit note. 

## 2014-10-08 NOTE — Assessment & Plan Note (Signed)
She is unable to exercise Encouraged her to eat smaller, more frequent meals

## 2014-10-08 NOTE — Assessment & Plan Note (Signed)
Well controlled on multiple meds CBC and CMET from last week reviewed

## 2014-10-08 NOTE — Assessment & Plan Note (Signed)
LDL at goal on statin CMET and Lipid Profile from last week reviewed Handout given on low fat diet

## 2014-10-08 NOTE — Assessment & Plan Note (Signed)
She is not motivated in her care at all ? If this is related to the anxiety and depression Will continue zoloft for now CBC and CMET reviewed

## 2014-10-08 NOTE — Assessment & Plan Note (Signed)
No issues on Dilantin CMET reviewed

## 2014-10-08 NOTE — Assessment & Plan Note (Signed)
Increased care needs Continue statin and Plavix

## 2014-10-08 NOTE — Assessment & Plan Note (Signed)
This is likely contributing to her falls She does not want to work with PT anymore She does not want referral to ortho or neuro for further evaluation Will obtain bone density to assess fracture risk Continue Gabapentin and Amitriptyline

## 2014-10-08 NOTE — Patient Instructions (Signed)

## 2014-10-08 NOTE — Progress Notes (Signed)
HPI:  Pt presents to the clinic today for her medicare wellness exam. She is also here for follow up of chronic medical conditions (see separate note). Her daughter reports she has had 20 + falls. She is not doing her leg exercises as instructed by PT. She walks with a walker but not properly.  Past Medical History  Diagnosis Date  . Stroke   . Hypertension   . Hyperlipidemia   . Arthritis   . Depression   . Ulcer   . History of blood transfusion   . Urinary incontinence   . Allergy   . Skin cancer of nose   . Cellulitis of left leg   . Pre-diabetes     Current Outpatient Prescriptions  Medication Sig Dispense Refill  . amitriptyline (ELAVIL) 25 MG tablet TAKE ONE TABLET BY MOUTH AT BEDTIME 30 tablet 5  . atorvastatin (LIPITOR) 80 MG tablet TAKE ONE TABLET BY MOUTH ONCE DAILY 30 tablet 0  . bisacodyl (DULCOLAX) 5 MG EC tablet Take 5 mg by mouth daily as needed for moderate constipation.    . Cholecalciferol 2000 UNITS TABS Take 2,000 Units by mouth every morning.     . clopidogrel (PLAVIX) 75 MG tablet TAKE ONE TABLET BY MOUTH ONCE DAILY IN THE EVENING 30 tablet 3  . diltiazem (CARDIZEM CD) 180 MG 24 hr capsule TAKE ONE CAPSULE BY MOUTH ONCE DAILY 30 capsule 3  . diphenhydramine-acetaminophen (TYLENOL PM) 25-500 MG TABS Take 1 tablet by mouth 2 (two) times daily.     Marland Kitchen docusate sodium (COLACE) 100 MG capsule Take 100 mg by mouth 2 (two) times daily.    . ferrous sulfate 325 (65 FE) MG EC tablet Take 325 mg by mouth daily with breakfast.     . gabapentin (NEURONTIN) 300 MG capsule TAKE THREE CAPSULES BY MOUTH IN THE MORNING, THEN  TAKE  TWO  CAPSULES  IN  THE  AFTERNOON  AND  THEN TAKE  TWO  CAPSULES  AT  BEDTIME 210 capsule 0  . hydrochlorothiazide (MICROZIDE) 12.5 MG capsule Take 1 capsule (12.5 mg total) by mouth daily. 30 capsule 0  . hydrochlorothiazide (MICROZIDE) 12.5 MG capsule TAKE ONE CAPSULE BY MOUTH ONCE DAILY 30 capsule 3  . lisinopril (PRINIVIL,ZESTRIL) 10 MG tablet  Take 1 tablet (10 mg total) by mouth daily. 30 tablet 5  . LORazepam (ATIVAN) 1 MG tablet TAKE ONE TABLET BY MOUTH ONCE DAILY AS NEEDED 30 tablet 0  . nitroGLYCERIN (NITROSTAT) 0.4 MG SL tablet Place 1 tablet (0.4 mg total) under the tongue every 5 (five) minutes as needed for chest pain. 30 tablet 0  . oxybutynin (DITROPAN) 5 MG tablet Take 1 tablet (5 mg total) by mouth 3 (three) times daily. 90 tablet 3  . phenytoin (DILANTIN) 100 MG ER capsule Take 2 capsules (200 mg total) by mouth 2 (two) times daily. 120 capsule 5  . sertraline (ZOLOFT) 50 MG tablet Take 1 tablet (50 mg total) by mouth daily. 30 tablet 3  . traZODone (DESYREL) 50 MG tablet Take 3 tablets (150 mg total) by mouth at bedtime. 90 tablet 5  . vitamin B-12 (CYANOCOBALAMIN) 1000 MCG tablet Take 1,000 mcg by mouth daily.     No current facility-administered medications for this visit.    Allergies  Allergen Reactions  . Codeine Nausea And Vomiting  . Ether Nausea And Vomiting    Family History  Problem Relation Age of Onset  . Heart disease Other     Parent  .  Cancer Maternal Uncle     History   Social History  . Marital Status: Widowed    Spouse Name: N/A  . Number of Children: 4  . Years of Education: 12   Occupational History  . Retired    Social History Main Topics  . Smoking status: Never Smoker   . Smokeless tobacco: Never Used  . Alcohol Use: No  . Drug Use: No  . Sexual Activity: No   Other Topics Concern  . Not on file   Social History Narrative   Regular exercise-no   Caffeine Use-yes    Hospitiliaztions: Admitted 03/2014 for ARF due to dehydration secondary to diarrhea. Daughter stopped her colace and iron supplement and that seemed to help get rid of her bowel issues.  Health Maintenance:    Flu: 03/2014  Tetanus: unsure of her last one, she does not want one  Pneumovax: unsure  Prevnar: 03/2014  Zostavax: not sure that she had chicken pox  Mammogram: < 5 years ago, but no longer  wants to screen (2006  is the last mammogram we have in the system.  Bone Density: > 2 years ago  Colonoscopy: never, she does not want one  Eye Doctor: > 2 years ago  Dental Exam: never, has dentures   I have personally reviewed and have noted:  1. The patient's medical and social history 2. Their use of alcohol, tobacco or illicit drugs 3. Their current medications and supplements 4. The patient's functional ability including ADL's, fall risks, home  safety risks and hearing or visual impairment. 5. Diet and physical activities 6. Evidence for depression or mood disorder  Providers:   PCP: Webb Silversmith, NP-C   Subjective:   Review of Systems:   Constitutional: Pt reports fatigue. Denies fever, malaise, headache or abrupt weight changes.  Respiratory: Denies difficulty breathing, shortness of breath, cough or sputum production.   Cardiovascular: Denies chest pain, chest tightness, palpitations or swelling in the hands or feet.  Gastrointestinal: Denies abdominal pain, bloating, constipation, diarrhea or blood in the stool.  GU: Pt reports incontinence. Denies urgency, frequency, pain with urination, burning sensation, blood in urine, odor or discharge. Musculoskeletal: Pt reports left lower leg pain and difficulty with gait. Denies decrease in range of motion, muscle pain or joint pain and swelling.  Skin: Denies redness, rashes, lesions or ulcercations.  Neurological: Pt reports problems with balance and coordination. Denies dizziness, difficulty with memory, difficulty with speech. Psych: Pt reports anxiety and depression. Denies SI/HI.   No other specific complaints in a complete review of systems (except as listed in HPI above).  Objective:  PE:   BP 138/70 mmHg  Pulse 78  Temp(Src) 98.1 F (36.7 C) (Oral)  Ht   Wt 233 lb (105.688 kg)  SpO2 97%  Wt Readings from Last 3 Encounters:  05/31/14 214 lb (97.07 kg)  04/30/14 230 lb (104.327 kg)  04/16/14 236 lb 12.4  oz (107.4 kg)    General: Appears her stated age, obese in NAD. Cardiovascular: Normal rate and rhythm. S1,S2 noted.  Murmur noted. No rubs or gallops noted. Trace BLE edema. No carotid bruits noted. Pulmonary/Chest: Normal effort and positive vesicular breath sounds. No respiratory distress. No wheezes, rales or ronchi noted.  Neurological: Alert and oriented.    BMET    Component Value Date/Time   NA 135 10/01/2014 1038   K 3.7 10/01/2014 1038   CL 101 10/01/2014 1038   CO2 27 10/01/2014 1038   GLUCOSE 118* 10/01/2014 1038  BUN 20 10/01/2014 1038   CREATININE 0.99 10/01/2014 1038   CALCIUM 9.2 10/01/2014 1038   GFRNONAA 64* 04/17/2014 0626   GFRAA 74* 04/17/2014 0626    Lipid Panel     Component Value Date/Time   CHOL 165 10/01/2014 1038   TRIG 123.0 10/01/2014 1038   HDL 49.40 10/01/2014 1038   CHOLHDL 3 10/01/2014 1038   VLDL 24.6 10/01/2014 1038   LDLCALC 91 10/01/2014 1038    CBC    Component Value Date/Time   WBC 5.3 10/01/2014 1038   RBC 3.84* 10/01/2014 1038   HGB 12.8 10/01/2014 1038   HCT 37.2 10/01/2014 1038   PLT 206.0 10/01/2014 1038   MCV 97.0 10/01/2014 1038   MCH 33.4 04/17/2014 0626   MCHC 34.4 10/01/2014 1038   RDW 13.2 10/01/2014 1038   LYMPHSABS 1.0 04/16/2014 1117   MONOABS 1.1* 04/16/2014 1117   EOSABS 0.0 04/16/2014 1117   BASOSABS 0.0 04/16/2014 1117    Hgb A1C Lab Results  Component Value Date   HGBA1C 5.5 10/01/2014      Assessment and Plan:   Medicare Annual Wellness Visit:  Diet: She is being provided with a low fat diet with lots of fruits and veggies Physical activity: Sedentary Depression/mood screen: Positive, but chronic, on medication Hearing: Intact to whispered voice Visual acuity: Grossly normal, does not perform annual eye exam  ADLs: Needs assist Fall risk: High fall risk duet to impaired mobility and left leg pain Home safety: Good Cognitive evaluation: Intact to orientation, naming, recall and  repetition EOL planning: Discussed advance directives, she does not have one. She would not like intubation or feeding tube. DNR form filled out today. Copy in chart.  Preventative Medicine:  She declines Tetanus booster, mammogram or colonoscopy Will obtain Bone Density exam due to frequent falls  Next appointment: 6 months for followup  HPI:  Pt presents to the clinic today to follow up chronic medical conditions.  Anxiety and Depression: Stable on her Zoloft and Ativan. She also takes Trazadone at night to help her sleep. She reports a lot of this is triggered by her worsening health and no longer being able to care for herself.   HTN: BP well controlled on Lisinopril, HCTZ and Cardizem. Her BP today is 138/70. She denies chest pain, chest tightness or shortness of breath.  HLD: She denies myalgias on Lipitor. Her last LDL was 91.  Prediabetes: Her last A1C was 5.5%. She denies blurred vision, polyuria or numbness or tingling in the hands or feet.  Seizures: No seizures on Dilantin.  Obesity: She is up 3 lbs since her last visit.  Stroke: On statin and Plavix. LDL controlled. No angina. BP controlled with Lisinopril, HCTZ and Cardizem.   Urinary Incontinence: Improved with Ditropan but still wears an incontinence brief every day.  Left leg pain: Chronic but seems worse to her. Gabapentin and Amitriptyline not helping. May be contributing to her frequent falls.  Past Medical History  Diagnosis Date  . Stroke   . Hypertension   . Hyperlipidemia   . Arthritis   . Depression   . Ulcer   . History of blood transfusion   . Urinary incontinence   . Allergy   . Skin cancer of nose   . Cellulitis of left leg   . Pre-diabetes    Family History  Problem Relation Age of Onset  . Heart disease Other     Parent  . Cancer Maternal Uncle    History  Social History  . Marital Status: Widowed    Spouse Name: N/A  . Number of Children: 4  . Years of Education: 12    Occupational History  . Retired    Social History Main Topics  . Smoking status: Never Smoker   . Smokeless tobacco: Never Used  . Alcohol Use: No  . Drug Use: No  . Sexual Activity: No   Other Topics Concern  . Not on file   Social History Narrative   Regular exercise-no   Caffeine Use-yes   Allergies  Allergen Reactions  . Codeine Nausea And Vomiting  . Ether Nausea And Vomiting   Current Outpatient Prescriptions on File Prior to Visit  Medication Sig Dispense Refill  . amitriptyline (ELAVIL) 25 MG tablet TAKE ONE TABLET BY MOUTH AT BEDTIME 30 tablet 5  . atorvastatin (LIPITOR) 80 MG tablet TAKE ONE TABLET BY MOUTH ONCE DAILY 30 tablet 0  . bisacodyl (DULCOLAX) 5 MG EC tablet Take 5 mg by mouth daily as needed for moderate constipation.    . Cholecalciferol 2000 UNITS TABS Take 2,000 Units by mouth every morning.     . clopidogrel (PLAVIX) 75 MG tablet TAKE ONE TABLET BY MOUTH ONCE DAILY IN THE EVENING 30 tablet 3  . diltiazem (CARDIZEM CD) 180 MG 24 hr capsule TAKE ONE CAPSULE BY MOUTH ONCE DAILY 30 capsule 3  . diphenhydramine-acetaminophen (TYLENOL PM) 25-500 MG TABS Take 1 tablet by mouth 2 (two) times daily.     Marland Kitchen docusate sodium (COLACE) 100 MG capsule Take 100 mg by mouth 2 (two) times daily.    . ferrous sulfate 325 (65 FE) MG EC tablet Take 325 mg by mouth daily with breakfast.     . gabapentin (NEURONTIN) 300 MG capsule TAKE THREE CAPSULES BY MOUTH IN THE MORNING, THEN  TAKE  TWO  CAPSULES  IN  THE  AFTERNOON  AND  THEN TAKE  TWO  CAPSULES  AT  BEDTIME 210 capsule 0  . hydrochlorothiazide (MICROZIDE) 12.5 MG capsule Take 1 capsule (12.5 mg total) by mouth daily. 30 capsule 0  . hydrochlorothiazide (MICROZIDE) 12.5 MG capsule TAKE ONE CAPSULE BY MOUTH ONCE DAILY 30 capsule 3  . lisinopril (PRINIVIL,ZESTRIL) 10 MG tablet Take 1 tablet (10 mg total) by mouth daily. 30 tablet 5  . LORazepam (ATIVAN) 1 MG tablet TAKE ONE TABLET BY MOUTH ONCE DAILY AS NEEDED 30 tablet 0   . nitroGLYCERIN (NITROSTAT) 0.4 MG SL tablet Place 1 tablet (0.4 mg total) under the tongue every 5 (five) minutes as needed for chest pain. 30 tablet 0  . oxybutynin (DITROPAN) 5 MG tablet Take 1 tablet (5 mg total) by mouth 3 (three) times daily. 90 tablet 3  . phenytoin (DILANTIN) 100 MG ER capsule Take 2 capsules (200 mg total) by mouth 2 (two) times daily. 120 capsule 5  . sertraline (ZOLOFT) 50 MG tablet Take 1 tablet (50 mg total) by mouth daily. 30 tablet 3  . traZODone (DESYREL) 50 MG tablet Take 3 tablets (150 mg total) by mouth at bedtime. 90 tablet 5  . vitamin B-12 (CYANOCOBALAMIN) 1000 MCG tablet Take 1,000 mcg by mouth daily.     No current facility-administered medications on file prior to visit.   Subjective:   Review of Systems:   Constitutional: Pt reports fatigue. Denies fever, malaise, headache or abrupt weight changes.  Respiratory: Denies difficulty breathing, shortness of breath, cough or sputum production.   Cardiovascular: Denies chest pain, chest tightness, palpitations or swelling in  the hands or feet.  Gastrointestinal: Denies abdominal pain, bloating, constipation, diarrhea or blood in the stool.  GU: Pt reports incontinence. Denies urgency, frequency, pain with urination, burning sensation, blood in urine, odor or discharge. Musculoskeletal: Pt reports left lower leg pain and difficulty with gait. Denies decrease in range of motion, muscle pain or joint pain and swelling.  Skin: Denies redness, rashes, lesions or ulcercations.  Neurological: Pt reports problems with balance and coordination. Denies dizziness, difficulty with memory, difficulty with speech. Psych: Pt reports anxiety and depression. Denies SI/HI.   No other specific complaints in a complete review of systems (except as listed in HPI above).  Objective:  PE:   BP 138/70 mmHg  Pulse 78  Temp(Src) 98.1 F (36.7 C) (Oral)  Ht   Wt 233 lb (105.688 kg)  SpO2 97%  Wt Readings from Last 3  Encounters:  05/31/14 214 lb (97.07 kg)  04/30/14 230 lb (104.327 kg)  04/16/14 236 lb 12.4 oz (107.4 kg)    General: Appears herstated age, obese, chronically ill appearing, in NAD. Skin: Warm, dry and intact. No rashes, lesions or ulcerations noted. HEENT: Head: normal shape and size; Eyes: sclera white, no icterus, conjunctiva pink, PERRLA and EOMs intact;  Neck:  Neck supple, trachea midline. No masses, lumps or thyromegaly present.  Cardiovascular: Normal rate and rhythm. S1,S2 noted. Murmur noted. No rubs or gallops noted. Trace BLE edema. No carotid bruits noted. Pulmonary/Chest: Normal effort and positive vesicular breath sounds. No respiratory distress. No wheezes, rales or ronchi noted.  Abdomen: Soft and nontender. Normal bowel sounds, no bruits noted. No distention or masses noted. Liver, spleen and kidneys non palpable. Musculoskeletal: Normal flexion and extension of the knee. Pain with palpation over the lateral calf. Negative Homan's sign. No remarkable swelling noted. Neurological: Alert and oriented.  Psychiatric: Mood tearful and affect flat.   BMET    Component Value Date/Time   NA 135 10/01/2014 1038   K 3.7 10/01/2014 1038   CL 101 10/01/2014 1038   CO2 27 10/01/2014 1038   GLUCOSE 118* 10/01/2014 1038   BUN 20 10/01/2014 1038   CREATININE 0.99 10/01/2014 1038   CALCIUM 9.2 10/01/2014 1038   GFRNONAA 64* 04/17/2014 0626   GFRAA 74* 04/17/2014 0626    Lipid Panel     Component Value Date/Time   CHOL 165 10/01/2014 1038   TRIG 123.0 10/01/2014 1038   HDL 49.40 10/01/2014 1038   CHOLHDL 3 10/01/2014 1038   VLDL 24.6 10/01/2014 1038   LDLCALC 91 10/01/2014 1038    CBC    Component Value Date/Time   WBC 5.3 10/01/2014 1038   RBC 3.84* 10/01/2014 1038   HGB 12.8 10/01/2014 1038   HCT 37.2 10/01/2014 1038   PLT 206.0 10/01/2014 1038   MCV 97.0 10/01/2014 1038   MCH 33.4 04/17/2014 0626   MCHC 34.4 10/01/2014 1038   RDW 13.2 10/01/2014 1038    LYMPHSABS 1.0 04/16/2014 1117   MONOABS 1.1* 04/16/2014 1117   EOSABS 0.0 04/16/2014 1117   BASOSABS 0.0 04/16/2014 1117    Hgb A1C Lab Results  Component Value Date   HGBA1C 5.5 10/01/2014   Assessment and Plan:

## 2014-10-08 NOTE — Assessment & Plan Note (Signed)
Will continue Ditropan for now

## 2014-10-12 ENCOUNTER — Other Ambulatory Visit: Payer: Self-pay | Admitting: Internal Medicine

## 2014-10-15 ENCOUNTER — Other Ambulatory Visit: Payer: Self-pay | Admitting: Internal Medicine

## 2014-10-15 NOTE — Telephone Encounter (Signed)
Electronic refill request. Last Filled:    30 tablet 0 RF on 09/11/2014  Last office visit:   10/08/14  Please advise.

## 2014-10-17 ENCOUNTER — Other Ambulatory Visit: Payer: Self-pay | Admitting: Internal Medicine

## 2014-10-17 NOTE — Telephone Encounter (Signed)
Ativan refill request.  Last seen 10/08/2014.  Called in.

## 2014-10-18 NOTE — Telephone Encounter (Signed)
V/M left for status of refills; [facility] said ready for pick up.left v/m to ck with pharmacy.

## 2014-10-28 ENCOUNTER — Other Ambulatory Visit: Payer: Self-pay | Admitting: Internal Medicine

## 2014-11-02 ENCOUNTER — Encounter: Payer: Self-pay | Admitting: Internal Medicine

## 2014-11-11 ENCOUNTER — Other Ambulatory Visit: Payer: Self-pay | Admitting: Internal Medicine

## 2014-11-20 ENCOUNTER — Other Ambulatory Visit: Payer: Self-pay

## 2014-11-20 NOTE — Telephone Encounter (Signed)
Last filled 10/17/2014--please advise

## 2014-11-21 ENCOUNTER — Other Ambulatory Visit: Payer: Self-pay | Admitting: Internal Medicine

## 2014-11-21 MED ORDER — LORAZEPAM 1 MG PO TABS: 1.0000 mg | ORAL_TABLET | Freq: Every day | ORAL | Status: DC | PRN

## 2014-11-21 NOTE — Telephone Encounter (Signed)
Ok to phone in Ativan 

## 2014-11-21 NOTE — Telephone Encounter (Signed)
Rx called in to pharmacy. 

## 2014-12-12 ENCOUNTER — Other Ambulatory Visit: Payer: Self-pay | Admitting: Internal Medicine

## 2014-12-13 ENCOUNTER — Telehealth: Payer: Self-pay | Admitting: *Deleted

## 2014-12-13 NOTE — Telephone Encounter (Signed)
Patient's daughter, [name], called as an FYI.  Ms Mcconnaughey is to be scheduled for a bone density test but the family is unable to get her up and out of the house at this time.  She seems to be very week and is unable to walk any distance without falling.  [name] states she will call to let us know as soon as the test is scheduled.

## 2014-12-13 NOTE — Telephone Encounter (Signed)
noted 

## 2014-12-25 ENCOUNTER — Other Ambulatory Visit: Payer: Self-pay | Admitting: Internal Medicine

## 2014-12-25 NOTE — Telephone Encounter (Signed)
Last filled 11/21/14--please advise

## 2014-12-25 NOTE — Telephone Encounter (Signed)
Ok to phone in [place]

## 2014-12-26 ENCOUNTER — Other Ambulatory Visit: Payer: Self-pay | Admitting: Internal Medicine

## 2014-12-26 NOTE — Telephone Encounter (Signed)
Rx called in to pharmacy. 

## 2014-12-28 ENCOUNTER — Other Ambulatory Visit: Payer: Self-pay

## 2014-12-28 MED ORDER — GABAPENTIN 300 MG PO CAPS: ORAL_CAPSULE | ORAL | Status: DC

## 2014-12-28 NOTE — Telephone Encounter (Signed)
Carol Page pts daughter left v/m requesting refill gabapentin to [facility]. Please advise. Annual exam 10/08/14; last refill # 210  X 1 refill on 10/29/14.Please advise.

## 2015-01-24 ENCOUNTER — Other Ambulatory Visit: Payer: Self-pay | Admitting: Internal Medicine

## 2015-01-24 NOTE — Telephone Encounter (Signed)
Last filled 12/25/2014--please advise

## 2015-01-24 NOTE — Telephone Encounter (Signed)
Rx called in to pharmacy. 

## 2015-01-24 NOTE — Telephone Encounter (Signed)
Ok to phone in Ativan 

## 2015-02-15 ENCOUNTER — Other Ambulatory Visit: Payer: Self-pay | Admitting: Internal Medicine

## 2015-02-21 ENCOUNTER — Other Ambulatory Visit: Payer: Self-pay | Admitting: Internal Medicine

## 2015-03-16 ENCOUNTER — Other Ambulatory Visit: Payer: Self-pay | Admitting: Internal Medicine

## 2015-03-18 NOTE — Telephone Encounter (Signed)
Ativan last filled 01/24/2015--please advise

## 2015-03-18 NOTE — Telephone Encounter (Signed)
Ok to phone it Ativan

## 2015-03-28 ENCOUNTER — Other Ambulatory Visit: Payer: Self-pay | Admitting: Internal Medicine

## 2015-03-29 ENCOUNTER — Other Ambulatory Visit: Payer: Self-pay | Admitting: Internal Medicine

## 2015-03-29 NOTE — Telephone Encounter (Signed)
Called to confirm and Rx for Ativan was not called in 03/18/15--Rx called into pharmacy

## 2015-04-11 ENCOUNTER — Encounter: Payer: Self-pay | Admitting: Internal Medicine

## 2015-04-11 ENCOUNTER — Ambulatory Visit (INDEPENDENT_AMBULATORY_CARE_PROVIDER_SITE_OTHER): Payer: Medicare Other | Admitting: Internal Medicine

## 2015-04-11 VITALS — BP 134/68 | HR 77 | Temp 98.1°F | Wt 228.0 lb

## 2015-04-11 DIAGNOSIS — Z23 Encounter for immunization: Secondary | ICD-10-CM | POA: Diagnosis not present

## 2015-04-11 DIAGNOSIS — F32A Depression, unspecified: Secondary | ICD-10-CM

## 2015-04-11 DIAGNOSIS — R296 Repeated falls: Secondary | ICD-10-CM

## 2015-04-11 DIAGNOSIS — F329 Major depressive disorder, single episode, unspecified: Secondary | ICD-10-CM

## 2015-04-11 DIAGNOSIS — G8929 Other chronic pain: Secondary | ICD-10-CM

## 2015-04-11 DIAGNOSIS — I10 Essential (primary) hypertension: Secondary | ICD-10-CM | POA: Diagnosis not present

## 2015-04-11 DIAGNOSIS — R7309 Other abnormal glucose: Secondary | ICD-10-CM

## 2015-04-11 DIAGNOSIS — I639 Cerebral infarction, unspecified: Secondary | ICD-10-CM

## 2015-04-11 DIAGNOSIS — R569 Unspecified convulsions: Secondary | ICD-10-CM

## 2015-04-11 DIAGNOSIS — M79602 Pain in left arm: Secondary | ICD-10-CM

## 2015-04-11 DIAGNOSIS — R7303 Prediabetes: Secondary | ICD-10-CM

## 2015-04-11 DIAGNOSIS — R946 Abnormal results of thyroid function studies: Secondary | ICD-10-CM | POA: Diagnosis not present

## 2015-04-11 DIAGNOSIS — E785 Hyperlipidemia, unspecified: Secondary | ICD-10-CM

## 2015-04-11 DIAGNOSIS — R32 Unspecified urinary incontinence: Secondary | ICD-10-CM

## 2015-04-11 DIAGNOSIS — M79605 Pain in left leg: Secondary | ICD-10-CM

## 2015-04-11 DIAGNOSIS — R7989 Other specified abnormal findings of blood chemistry: Secondary | ICD-10-CM

## 2015-04-11 LAB — CBC
HEMATOCRIT: 40 % (ref 36.0–46.0)
HEMOGLOBIN: 13.4 g/dL (ref 12.0–15.0)
MCHC: 33.5 g/dL (ref 30.0–36.0)
MCV: 100.4 fl — ABNORMAL HIGH (ref 78.0–100.0)
PLATELETS: 207 10*3/uL (ref 150.0–400.0)
RBC: 3.99 Mil/uL (ref 3.87–5.11)
RDW: 13.4 % (ref 11.5–15.5)
WBC: 7.9 10*3/uL (ref 4.0–10.5)

## 2015-04-11 LAB — COMPREHENSIVE METABOLIC PANEL
ALK PHOS: 68 U/L (ref 39–117)
ALT: 30 U/L (ref 0–35)
AST: 22 U/L (ref 0–37)
Albumin: 4.2 g/dL (ref 3.5–5.2)
BUN: 22 mg/dL (ref 6–23)
CHLORIDE: 98 meq/L (ref 96–112)
CO2: 27 mEq/L (ref 19–32)
Calcium: 9.6 mg/dL (ref 8.4–10.5)
Creatinine, Ser: 0.79 mg/dL (ref 0.40–1.20)
GFR: 74.9 mL/min (ref >60.00)
GLUCOSE: 97 mg/dL (ref 70–99)
POTASSIUM: 4.1 meq/L (ref 3.5–5.1)
SODIUM: 136 meq/L (ref 135–145)
TOTAL PROTEIN: 7.4 g/dL (ref 6.0–8.3)
Total Bilirubin: 0.4 mg/dL (ref 0.2–1.2)

## 2015-04-11 LAB — T4, FREE: FREE T4: 0.86 ng/dL (ref 0.60–1.60)

## 2015-04-11 LAB — TSH: TSH: 2.91 u[IU]/mL (ref 0.35–4.50)

## 2015-04-11 MED ORDER — ESCITALOPRAM OXALATE 10 MG PO TABS: 10.0000 mg | ORAL_TABLET | Freq: Every day | ORAL | Status: DC

## 2015-04-11 NOTE — Progress Notes (Signed)
Subjective:    Patient ID: Carol Page, female    DOB: [birth year], 79 y.o.   MRN: [id number]  HPI  Pt presents to the clinic today to follow up chronic medical conditions.  Anxiety and Depression: Chronic but not stable. She was on Zoloft at one point but stopped it because she did not feel like it was helpful. She takes Ativan daily. Her daughter reports she only gives her 1/2 tab of Ativan at a time because a whole tab seems to cause her some memory issues. She also takes Trazadone at night to help her sleep. She reports a lot of this is triggered by her worsening health and no longer being able to care for herself. She feels like she is a burden to her daughter.  HTN: BP well controlled on Lisinopril, HCTZ and Cardizem. Her BP today is 134/68. She denies chest pain, chest tightness or shortness of breath. ECG from 03/2014 reviewed.  HLD: She denies myalgias on Lipitor. Her last LDL was 91. Her daughter reports she will not consume a low fat diet.  Prediabetes: Her last A1C was 5.5%. She denies blurred vision, polyuria or numbness or tingling in the hands or feet.  Seizures: No seizures on Dilantin.  Obesity: She is down 5 lbs since her last visit. Her BMI is 39.12. She does not adhere to any specific diet regimen. She is unable to exercise.  Stroke: On Lipitor and Plavix. LDL controlled. No angina. BP controlled with Lisinopril, HCTZ and Cardizem.   Urinary Incontinence: Improved with Ditropan but still wears an incontinence brief every day.  Left leg pain: Chronic but seems worse to her. Gabapentin and Amitriptyline not helping. May be contributing to her frequent falls. Her daughter reports she falls almost on a daily basis. Pt reports she feels like she has no control over her legs and they just give out on her. She has had PT in the past for this same issue and is not interested in pursuing this again. Her daughter reports she has assistive devices but she does not use them. Her  daughter reports having more trouble taking care of her mother and wants to know if it is possible to have home health to come help her.  Of note, she did have an elevated TSH at her last visit that was never followed up on. She denies any history of thyroid disorder in the past.  Review of Systems  Past Medical History  Diagnosis Date  . Stroke   . Hypertension   . Hyperlipidemia   . Arthritis   . Depression   . Ulcer   . History of blood transfusion   . Urinary incontinence   . Allergy   . Skin cancer of nose   . Cellulitis of left leg   . Pre-diabetes    Family History  Problem Relation Age of Onset  . Heart disease Other     Parent  . Cancer Maternal Uncle    Social History  Substance Use Topics  . Smoking status: Never Smoker   . Smokeless tobacco: Never Used  . Alcohol Use: No   Allergies  Allergen Reactions  . Codeine Nausea And Vomiting  . Ether Nausea And Vomiting   Current Outpatient Prescriptions on File Prior to Visit  Medication Sig Dispense Refill  . amitriptyline (ELAVIL) 25 MG tablet TAKE ONE TABLET BY MOUTH AT BEDTIME 30 tablet 3  . atorvastatin (LIPITOR) 80 MG tablet TAKE ONE TABLET BY MOUTH ONCE DAILY. 30 tablet  5  . bisacodyl (DULCOLAX) 5 MG EC tablet Take 5 mg by mouth daily as needed for moderate constipation.    Marland Kitchen CARTIA XT 180 MG 24 hr capsule TAKE ONE CAPSULE BY MOUTH ONCE DAILY 30 capsule 5  . Cholecalciferol 2000 UNITS TABS Take 2,000 Units by mouth every morning.     . clopidogrel (PLAVIX) 75 MG tablet TAKE ONE TABLET BY MOUTH IN THE EVENING 30 tablet 5  . diphenhydramine-acetaminophen (TYLENOL PM) 25-500 MG TABS Take 1 tablet by mouth 2 (two) times daily.     Marland Kitchen gabapentin (NEURONTIN) 300 MG capsule TAKE THREE CAPSULES BY MOUTH IN THE MORNING, TWO CAPSULES BY MOUTH IN THE AFTERNOON AND TWO CAPSULES BY MOUTH AT BEDTIME 210 capsule 2  . hydrochlorothiazide (MICROZIDE) 12.5 MG capsule TAKE ONE CAPSULE BY MOUTH ONCE DAILY 30 capsule 5  .  lisinopril (PRINIVIL,ZESTRIL) 10 MG tablet TAKE ONE TABLET BY MOUTH ONCE DAILY 30 tablet 3  . LORazepam (ATIVAN) 1 MG tablet TAKE ONE TABLET BY MOUTH ONCE DAILY AS NEEDED 30 tablet 0  . nitroGLYCERIN (NITROSTAT) 0.4 MG SL tablet Place 1 tablet (0.4 mg total) under the tongue every 5 (five) minutes as needed for chest pain. 30 tablet 0  . oxybutynin (DITROPAN) 5 MG tablet Take 1 tablet (5 mg total) by mouth 3 (three) times daily. 90 tablet 3  . oxybutynin (DITROPAN) 5 MG tablet TAKE ONE TABLET BY MOUTH THREE TIMES DAILY 90 tablet 0  . phenytoin (DILANTIN) 100 MG ER capsule TAKE TWO CAPSULES BY MOUTH TWICE DAILY 120 capsule 5  . traZODone (DESYREL) 50 MG tablet TAKE THREE TABLETS BY MOUTH AT BEDTIME 90 tablet 5  . vitamin B-12 (CYANOCOBALAMIN) 1000 MCG tablet Take 1,000 mcg by mouth daily.     No current facility-administered medications on file prior to visit.      Constitutional: Pt reports fatigue. Denies fever, malaise, headache or abrupt weight changes.  Respiratory: Denies difficulty breathing, shortness of breath, cough or sputum production.   Cardiovascular: Denies chest pain, chest tightness, palpitations or swelling in the hands or feet.  Gastrointestinal: Denies abdominal pain, bloating, constipation, diarrhea or blood in the stool.  GU: Pt reports incontinence. Denies urgency, frequency, pain with urination, burning sensation, blood in urine, odor or discharge. Musculoskeletal: Pt reports left lower leg pain and difficulty with gait. Denies decrease in range of motion, muscle pain or joint swelling.  Skin: Denies redness, rashes, lesions or ulcercations.  Neurological: Pt reports problems with balance and coordination. Denies dizziness, difficulty with memory, difficulty with speech. Psych: Pt reports anxiety and depression. Denies SI/HI.   No other specific complaints in a complete review of systems (except as listed in HPI above).   Objective:   Physical Exam  BP 134/68 mmHg   Pulse 77  Temp(Src) 98.1 F (36.7 C) (Oral)  Wt 228 lb (103.42 kg)  SpO2 97%  General: Appears her stated age, obese, chronically ill appearing, in NAD. Skin: Warm, dry and intact. No rashes, lesions or ulcerations noted. HEENT: Head: normal shape and size; Eyes: sclera white, no icterus, conjunctiva pink, PERRLA and EOMs intact;  Neck:  Neck supple, trachea midline. No masses, lumps or thyromegaly present.  Cardiovascular: Normal rate and rhythm. S1,S2 noted. Murmur noted. No rubs or gallops noted. Trace BLE edema. No carotid bruits noted. Pulmonary/Chest: Normal effort and positive vesicular breath sounds. No respiratory distress. No wheezes, rales or ronchi noted.  Abdomen: Soft and nontender. Normal bowel sounds.  Musculoskeletal: Normal flexion and extension of  the knee. Pain with palpation over the lateral calf. Negative Homan's sign. No remarkable swelling noted. Neurological: Alert and oriented.  Psychiatric: Mood tearful and affect flat.       Assessment & Plan:   Elevated TSH:  Will check TSH and Free T 4 today

## 2015-04-11 NOTE — Addendum Note (Signed)
Addended by: Jearld Fenton on: 04/11/2015 11:49 AM   Modules accepted: Orders

## 2015-04-11 NOTE — Assessment & Plan Note (Signed)
Encouraged her to consume a low carb diet She is unable to exercise

## 2015-04-11 NOTE — Assessment & Plan Note (Addendum)
She is not interested in orthopedic referral at this time (because she feels like it is an extra burden on her daughter) She will continue Gabapentin and Amitriptyline

## 2015-04-11 NOTE — Assessment & Plan Note (Signed)
Continue Ditropan Advised her to start schedule toileting every 2 hours to prevent leakage

## 2015-04-11 NOTE — Assessment & Plan Note (Signed)
No residual effects Continue Lipitor, Lisinopril, HCTZ, Plavix and Cardizem

## 2015-04-11 NOTE — Progress Notes (Deleted)
Subjective:    Patient ID: Carol Page, female    DOB: January 19, 1936, 79 y.o.   MRN: [id number]  HPI [name] is a 79 year old female who presents today for 6 month follow up of her chronic conditions.    1. Hypertension - On lisinopril, hctz, and diltiazem. Does not take home blood pressures.   2. Hyperlipidemia - Says she follows a low fat diet.  Denies myalgias, last LDL was 91.   3. Anxiety/Depression - Reports that she sleeps a lot during the day, doesn't feel like doing much.  Feels depressed. No longer on Zoloft.  Stopped taking it because she feels like it didn't do anything.    4. Stroke - No confusion, speech difficulty.  On statin and takes plavix.   5. Pre-diabetes - Has frequent urination, A1C stable. Denies blurred vision, tingling in hands or feet.       6. Seizures - No symptoms. Remains on dilantin.      Review of Systems  Constitutional: Negative for fever, chills and fatigue.  HENT: Negative for congestion, postnasal drip and sore throat.   Respiratory: Negative for cough, shortness of breath and wheezing.   Cardiovascular: Negative for chest pain, palpitations and leg swelling.  Gastrointestinal: Negative for abdominal pain, diarrhea and constipation.  Endocrine: Positive for polyuria.  Genitourinary: Positive for urgency. Negative for dysuria and decreased urine volume.  Musculoskeletal: Positive for myalgias and arthralgias. Negative for joint swelling.  Skin: Negative.   Neurological: Negative for dizziness and headaches.  Psychiatric/Behavioral: Negative for agitation. The patient is nervous/anxious.    Family History  Problem Relation Age of Onset  . Heart disease Other     Parent  . Cancer Maternal Uncle    Current Outpatient Prescriptions on File Prior to Visit  Medication Sig Dispense Refill  . amitriptyline (ELAVIL) 25 MG tablet TAKE ONE TABLET BY MOUTH AT BEDTIME 30 tablet 3  . atorvastatin (LIPITOR) 80 MG tablet TAKE ONE TABLET BY MOUTH ONCE  DAILY. 30 tablet 5  . bisacodyl (DULCOLAX) 5 MG EC tablet Take 5 mg by mouth daily as needed for moderate constipation.    Marland Kitchen CARTIA XT 180 MG 24 hr capsule TAKE ONE CAPSULE BY MOUTH ONCE DAILY 30 capsule 5  . Cholecalciferol 2000 UNITS TABS Take 2,000 Units by mouth every morning.     . clopidogrel (PLAVIX) 75 MG tablet TAKE ONE TABLET BY MOUTH IN THE EVENING 30 tablet 5  . diphenhydramine-acetaminophen (TYLENOL PM) 25-500 MG TABS Take 1 tablet by mouth 2 (two) times daily.     Marland Kitchen gabapentin (NEURONTIN) 300 MG capsule TAKE THREE CAPSULES BY MOUTH IN THE MORNING, TWO CAPSULES BY MOUTH IN THE AFTERNOON AND TWO CAPSULES BY MOUTH AT BEDTIME 210 capsule 2  . hydrochlorothiazide (MICROZIDE) 12.5 MG capsule TAKE ONE CAPSULE BY MOUTH ONCE DAILY 30 capsule 5  . lisinopril (PRINIVIL,ZESTRIL) 10 MG tablet TAKE ONE TABLET BY MOUTH ONCE DAILY 30 tablet 3  . LORazepam (ATIVAN) 1 MG tablet TAKE ONE TABLET BY MOUTH ONCE DAILY AS NEEDED 30 tablet 0  . nitroGLYCERIN (NITROSTAT) 0.4 MG SL tablet Place 1 tablet (0.4 mg total) under the tongue every 5 (five) minutes as needed for chest pain. 30 tablet 0  . oxybutynin (DITROPAN) 5 MG tablet Take 1 tablet (5 mg total) by mouth 3 (three) times daily. 90 tablet 3  . oxybutynin (DITROPAN) 5 MG tablet TAKE ONE TABLET BY MOUTH THREE TIMES DAILY 90 tablet 0  . phenytoin (DILANTIN) 100  MG ER capsule TAKE TWO CAPSULES BY MOUTH TWICE DAILY 120 capsule 5  . traZODone (DESYREL) 50 MG tablet TAKE THREE TABLETS BY MOUTH AT BEDTIME 90 tablet 5  . vitamin B-12 (CYANOCOBALAMIN) 1000 MCG tablet Take 1,000 mcg by mouth daily.     No current facility-administered medications on file prior to visit.       Objective:   Physical Exam  Constitutional: She is oriented to person, place, and time. She appears well-developed and well-nourished. No distress.  HENT:  Head: Normocephalic and atraumatic.  Right Ear: External ear normal.  Left Ear: External ear normal.  Mouth/Throat:  Oropharynx is clear and moist. No oropharyngeal exudate.  Neck: Normal range of motion. Neck supple. No JVD present.  Cardiovascular: Normal rate, regular rhythm and normal heart sounds.   No murmur heard. Pulmonary/Chest: Effort normal and breath sounds normal.  Abdominal: Soft. Bowel sounds are normal. She exhibits no distension. There is no tenderness.  Musculoskeletal:  Decreased ROM in left leg  Lymphadenopathy:    She has no cervical adenopathy.  Neurological: She is alert and oriented to person, place, and time.  Skin: Skin is dry. She is not diaphoretic.    BP 134/68 mmHg  Pulse 77  Temp(Src) 98.1 F (36.7 C) (Oral)  Wt 228 lb (103.42 kg)  SpO2 97%       Assessment & Plan:  1. Hypothyroidism - Last TSH was 5.66, will repeat TSH today and if it is still elevated, will Rx for Synthroid 45mcg daily.    2. Hypertension Stable.  Continue same medication regimen. BMP today.   3. Stroke  Stable.  Continue statin and Plavix.    4. Pre-diabetes Stable.  A1c today.   5. Hyperlipidemia  LDL at goal. Continue Lipitor.   CMP today.   6. Seizure disorder Stable.  Continue dilantin  7. Depression Start Lexapro 10mg  daily.  Follow up in 3 months.

## 2015-04-11 NOTE — Assessment & Plan Note (Signed)
BP well controlled on Lisinopril, HCTZ and Cardizem ECG reviewed Will check CBC and CMET today

## 2015-04-11 NOTE — Assessment & Plan Note (Signed)
LDL at goal on Lipitor Advised her to consume a low fat diet

## 2015-04-11 NOTE — Assessment & Plan Note (Signed)
A1C from 09/2014 5.5% No need to repeat A1C at this time Will continue to monitor Advised her to consume a low carb diet

## 2015-04-11 NOTE — Assessment & Plan Note (Signed)
No issues on Dilantin Will check CMET today

## 2015-04-11 NOTE — Patient Instructions (Signed)
Body Mass Index (BMI) This BMI is not intended for use with those under 76 years of age, or pregnant women, or lactating women. To estimate BMI, locate your height, then find your weight in this listing. Your BMI is located to the right of your weight. Height: 58 inches  Weight: 91 lb = BMI 19 (normal)  Weight: 96 lb = BMI 20 (normal)  Weight: 100 lb = BMI 21 (normal)  Weight: 105 lb = BMI 22 (normal)  Weight: 110 lb = BMI 23 (normal)  Weight: 115 lb = BMI 24 (normal)  Weight: 119 lb = BMI 25 (overweight)  Weight: 124 lb = BMI 26 (overweight)  Weight: 129 lb = BMI 27 (overweight)  Weight: 134 lb = BMI 28 (overweight)  Weight: 138 lb = BMI 29 (overweight)  Weight: 143 lb = BMI 30 (obese)  Weight: 148 lb = BMI 31 (obese)  Weight: 153 lb = BMI 32 (obese)  Weight: 158 lb = BMI 33 (obese)  Weight: 162 lb = BMI 34 (obese)  Weight: 167 lb = BMI 35 (obese)  Weight: 172 lb = BMI 36 (obese)  Weight: 177 lb = BMI 37 (obese)  Weight: 181 lb = BMI 38 (obese)  Weight: 186 lb = BMI 39 (obese)  Weight: 191 lb = BMI 40 (extreme obesity)  Weight: 196 lb = BMI 41 (extreme obesity)  Weight: 201 lb = BMI 42 (extreme obesity)  Weight: 205 lb = BMI 43 (extreme obesity)  Weight: 210 lb = BMI 44 (extreme obesity)  Weight: 215 lb = BMI 45 (extreme obesity)  Weight: 220 lb = BMI 46 (extreme obesity)  Weight: 224 lb = BMI 47 (extreme obesity)  Weight: 229 lb = BMI 48 (extreme obesity)  Weight: 234 lb = BMI 49 (extreme obesity)  Weight: 239 lb = BMI 50 (extreme obesity)  Weight: 244 lb = BMI 51 (extreme obesity)  Weight: 248 lb = BMI 52 (extreme obesity)  Weight: 253 lb = BMI 53 (extreme obesity)  Weight: 258 lb = BMI 54 (extreme obesity) Height: 59 inches  Weight: 94 lb = BMI 19 (normal)  Weight: 99 lb = BMI 20 (normal)  Weight: 104 lb = BMI 21 (normal)  Weight: 109 lb = BMI 22 (normal)  Weight: 114 lb = BMI 23 (normal)  Weight: 119 lb = BMI 24  (normal)  Weight: 124 lb = BMI 25 (overweight)  Weight: 128 lb = BMI 26 (overweight)  Weight: 133 lb = BMI 27 (overweight)  Weight: 138 lb = BMI 28 (overweight)  Weight: 143 lb = BMI 29 (overweight)  Weight: 148 lb = BMI 30 (obese)  Weight: 153 lb = BMI 31 (obese)  Weight: 158 lb = BMI 32 (obese)  Weight: 163 lb = BMI 33 (obese)  Weight: 168 lb = BMI 34 (obese)  Weight: 173 lb = BMI 35 (obese)  Weight: 178 lb = BMI 36 (obese)  Weight: 183 lb = BMI 37 (obese)  Weight: 188 lb = BMI 38 (obese)  Weight: 193 lb = BMI 39 (obese)  Weight: 198 lb = BMI 40 (extreme obesity)  Weight: 203 lb = BMI 41 (extreme obesity)  Weight: 208 lb = BMI 42 (extreme obesity)  Weight: 212 lb = BMI 43 (extreme obesity)  Weight: 217 lb = BMI 44 (extreme obesity)  Weight: 222 lb = BMI 45 (extreme obesity)  Weight: 227 lb = BMI 46 (extreme obesity)  Weight: 232 lb = BMI 47 (extreme obesity)  Weight: 237  lb = BMI 48 (extreme obesity)  Weight: 242 lb = BMI 49 (extreme obesity)  Weight: 247 lb = BMI 50 (extreme obesity)  Weight: 252 lb = BMI 51 (extreme obesity)  Weight: 257 lb = BMI 52 (extreme obesity)  Weight: 262 lb = BMI 53 (extreme obesity)  Weight: 267 lb = BMI 54 (extreme obesity) Height: 60 inches  Weight: 97 lb = BMI 19 (normal)  Weight: 102 lb = BMI 20 (normal)  Weight: 107 lb = BMI 21 (normal)  Weight: 112 lb = BMI 22 (normal)  Weight: 118 lb = BMI 23 (normal)  Weight: 123 lb = BMI 24 (normal)  Weight: 128 lb = BMI 25 (overweight)  Weight: 133 lb = BMI 26 (overweight)  Weight: 138 lb = BMI 27 (overweight)  Weight: 143 lb = BMI 28 (overweight)  Weight: 148 lb = BMI 29 (overweight)  Weight: 153 lb = BMI 30 (obese)  Weight: 158 lb = BMI 31 (obese)  Weight: 163 lb = BMI 32 (obese)  Weight: 168 lb = BMI 33 (obese)  Weight: 174 lb = BMI 34 (obese)  Weight: 179 lb = BMI 35 (obese)  Weight: 184 lb = BMI 36 (obese)  Weight: 189 lb = BMI 37  (obese)  Weight: 194 lb = BMI 38 (obese)  Weight: 199 lb = BMI 39 (obese)  Weight: 204 lb = BMI 40 (extreme obesity)  Weight: 209 lb = BMI 41 (extreme obesity)  Weight: 215 lb = BMI 42 (extreme obesity)  Weight: 220 lb = BMI 43 (extreme obesity)  Weight: 225 lb = BMI 44 (extreme obesity)  Weight: 230 lb = BMI 45 (extreme obesity)  Weight: 235 lb = BMI 46 (extreme obesity)  Weight: 240 lb = BMI 47 (extreme obesity)  Weight: 245 lb = BMI 48 (extreme obesity)  Weight: 250 lb = BMI 49 (extreme obesity)  Weight: 255 lb = BMI 50 (extreme obesity)  Weight: 261 lb = BMI 51 (extreme obesity)  Weight: 266 lb = BMI 52 (extreme obesity)  Weight: 271 lb = BMI 53 (extreme obesity)  Weight: 276 lb = BMI 54 (extreme obesity) Height: 61 inches  Weight: 100 lb = BMI 19 (normal)  Weight: 106 lb = BMI 20 (normal)  Weight: 111 lb = BMI 21 (normal)  Weight: 116 lb = BMI 22 (normal)  Weight: 122 lb = BMI 23 (normal)  Weight: 127 lb = BMI 24 (normal)  Weight: 132 lb = BMI 25 (overweight)  Weight: 137 lb = BMI 26 (overweight)  Weight: 143 lb = BMI 27 (overweight)  Weight: 148 lb = BMI 28 (overweight)  Weight: 153 lb = BMI 29 (overweight)  Weight: 158 lb = BMI 30 (obese)  Weight: 164 lb = BMI 31 (obese)  Weight: 169 lb = BMI 32 (obese)  Weight: 174 lb = BMI 33 (obese)  Weight: 180 lb = BMI 34 (obese)  Weight: 185 lb = BMI 35 (obese)  Weight: 190 lb = BMI 36 (obese)  Weight: 195 lb = BMI 37 (obese)  Weight: 201 lb = BMI 38 (obese)  Weight: 206 lb = BMI 39 (obese)  Weight: 211 lb = BMI 40 (extreme obesity)  Weight: 217 lb = BMI 41 (extreme obesity)  Weight: 222 lb = BMI 42 (extreme obesity)  Weight: 227 lb = BMI 43 (extreme obesity)  Weight: 232 lb = BMI 44 (extreme obesity)  Weight: 238 lb = BMI 45 (extreme obesity)  Weight: 243 lb = BMI 46 (extreme  obesity)  Weight: 248 lb = BMI 47 (extreme obesity)  Weight: 254 lb = BMI 48 (extreme  obesity)  Weight: 259 lb = BMI 49 (extreme obesity)  Weight: 264 lb = BMI 50 (extreme obesity)  Weight: 269 lb = BMI 51 (extreme obesity)  Weight: 275 lb = BMI 52 (extreme obesity)  Weight: 280 lb = BMI 53 (extreme obesity)  Weight: 285 lb = BMI 54 (extreme obesity) Height: 62 inches  Weight: 104 lb = BMI 19 (normal)  Weight: 109 lb = BMI 20 (normal)  Weight: 115 lb = BMI 21 (normal)  Weight: 120 lb = BMI 22 (normal)  Weight: 126 lb = BMI 23 (normal)  Weight: 131 lb = BMI 24 (normal)  Weight: 136 lb = BMI 25 (overweight)  Weight: 142 lb = BMI 26 (overweight)  Weight: 147 lb = BMI 27 (overweight)  Weight: 153 lb = BMI 28 (overweight)  Weight: 158 lb = BMI 29 (overweight)  Weight: 164 lb = BMI 30 (obese)  Weight: 169 lb = BMI 31 (obese)  Weight: 175 lb = BMI 32 (obese)  Weight: 180 lb = BMI 33 (obese)  Weight: 186 lb = BMI 34 (obese)  Weight: 191 lb = BMI 35 (obese)  Weight: 196 lb = BMI 36 (obese)  Weight: 202 lb = BMI 37 (obese)  Weight: 207 lb = BMI 38 (obese)  Weight: 213 lb = BMI 39 (obese)  Weight: 218 lb = BMI 40 (extreme obesity)  Weight: 224 lb = BMI 41 (extreme obesity)  Weight: 229 lb = BMI 42 (extreme obesity)  Weight: 235 lb = BMI 43 (extreme obesity)  Weight: 240 lb = BMI 44 (extreme obesity)  Weight: 246 lb = BMI 45 (extreme obesity)  Weight: 251 lb = BMI 46 (extreme obesity)  Weight: 256 lb = BMI 47 (extreme obesity)  Weight: 262 lb = BMI 48 (extreme obesity)  Weight: 267 lb = BMI 49 (extreme obesity)  Weight: 273 lb = BMI 50 (extreme obesity)  Weight: 278 lb = BMI 51 (extreme obesity)  Weight: 284 lb = BMI 52 (extreme obesity)  Weight: 289 lb = BMI 53 (extreme obesity)  Weight: 295 lb = BMI 54 (extreme obesity) Height: 63 inches  Weight: 107 lb = BMI 19 (normal)  Weight: 113 lb = BMI 20 (normal)  Weight: 118 lb = BMI 21 (normal)  Weight: 124 lb = BMI 22 (normal)  Weight: 130 lb = BMI 23 (normal)  Weight:  135 lb = BMI 24 (normal)  Weight: 141 lb = BMI 25 (overweight)  Weight: 146 lb = BMI 26 (overweight)  Weight: 152 lb = BMI 27 (overweight)  Weight: 158 lb = BMI 28 (overweight)  Weight: 163 lb = BMI 29 (overweight)  Weight: 169 lb = BMI 30 (obese)  Weight: 175 lb = BMI 31 (obese)  Weight: 180 lb = BMI 32 (obese)  Weight: 186 lb = BMI 33 (obese)  Weight: 191 lb = BMI 34 (obese)  Weight: 197 lb = BMI 35 (obese)  Weight: 203 lb = BMI 36 (obese)  Weight: 208 lb = BMI 37 (obese)  Weight: 214 lb = BMI 38 (obese)  Weight: 220 lb = BMI 39 (obese)  Weight: 225 lb = BMI 40 (extreme obesity)  Weight: 231 lb = BMI 41 (extreme obesity)  Weight: 237 lb = BMI 42 (extreme obesity)  Weight: 242 lb = BMI 43 (extreme obesity)  Weight: 248 lb = BMI 44 (extreme obesity)  Weight: 254  lb = BMI 45 (extreme obesity)  Weight: 259 lb = BMI 46 (extreme obesity)  Weight: 265 lb = BMI 47 (extreme obesity)  Weight: 270 lb = BMI 48 (extreme obesity)  Weight: 278 lb = BMI 49 (extreme obesity)  Weight: 282 lb = BMI 50 (extreme obesity)  Weight: 287 lb = BMI 51 (extreme obesity)  Weight: 293 lb = BMI 52 (extreme obesity)  Weight: 299 lb = BMI 53 (extreme obesity)  Weight: 304 lb = BMI 54 (extreme obesity) Height: 64 inches  Weight: 110 lb = BMI 19 (normal)  Weight: 116 lb = BMI 20 (normal)  Weight: 122 lb = BMI 21 (normal)  Weight: 128 lb = BMI 22 (normal)  Weight: 134 lb = BMI 23 (normal)  Weight: 140 lb = BMI 24 (normal)  Weight: 145 lb = BMI 25 (overweight)  Weight: 151 lb = BMI 26 (overweight)  Weight: 157 lb = BMI 27 (overweight)  Weight: 163 lb = BMI 28 (overweight)  Weight: 169 lb = BMI 29 (overweight)  Weight: 174 lb = BMI 30 (obese)  Weight: 180 lb = BMI 31 (obese)  Weight: 186 lb = BMI 32 (obese)  Weight: 192 lb = BMI 33 (obese)  Weight: 197 lb = BMI 34 (obese)  Weight: 204 lb = BMI 35 (obese)  Weight: 209 lb = BMI 36 (obese)  Weight: 215 lb =  BMI 37 (obese)  Weight: 221 lb = BMI 38 (obese)  Weight: 227 lb = BMI 39 (obese)  Weight: 232 lb = BMI 40 (extreme obesity)  Weight: 238 lb = BMI 41 (extreme obesity)  Weight: 244 lb = BMI 42 (extreme obesity)  Weight: 250 lb = BMI 43 (extreme obesity)  Weight: 256 lb = BMI 44 (extreme obesity)  Weight: 262 lb = BMI 45 (extreme obesity)  Weight: 267 lb = BMI 46 (extreme obesity)  Weight: 273 lb = BMI 47 (extreme obesity)  Weight: 279 lb = BMI 48 (extreme obesity)  Weight: 285 lb = BMI 49 (extreme obesity)  Weight: 291 lb = BMI 50 (extreme obesity)  Weight: 296 lb = BMI 51 (extreme obesity)  Weight: 302 lb = BMI 52 (extreme obesity)  Weight: 308 lb = BMI 53 (extreme obesity)  Weight: 314 lb = BMI 54 (extreme obesity) Height: 65 inches  Weight: 114 lb = BMI 19 (normal)  Weight: 120 lb = BMI 20 (normal)  Weight: 126 lb = BMI 21 (normal)  Weight: 132 lb = BMI 22 (normal)  Weight: 138 lb = BMI 23 (normal)  Weight: 144 lb = BMI 24 (normal)  Weight: 150 lb = BMI 25 (overweight)  Weight: 156 lb = BMI 26 (overweight)  Weight: 162 lb = BMI 27 (overweight)  Weight: 168 lb = BMI 28 (overweight)  Weight: 174 lb = BMI 29 (overweight)  Weight: 180 lb = BMI 30 (obese)  Weight: 186 lb = BMI 31 (obese)  Weight: 192 lb = BMI 32 (obese)  Weight: 198 lb = BMI 33 (obese)  Weight: 204 lb = BMI 34 (obese)  Weight: 210 lb = BMI 35 (obese)  Weight: 216 lb = BMI 36 (obese)  Weight: 222 lb = BMI 37 (obese)  Weight: 228 lb = BMI 38 (obese)  Weight: 234 lb = BMI 39 (obese)  Weight: 240 lb = BMI 40 (extreme obesity)  Weight: 246 lb = BMI 41 (extreme obesity)  Weight: 252 lb = BMI 42 (extreme obesity)  Weight: 258 lb = BMI 43 (extreme  obesity)  Weight: 264 lb = BMI 44 (extreme obesity)  Weight: 270 lb = BMI 45 (extreme obesity)  Weight: 276 lb = BMI 46 (extreme obesity)  Weight: 282 lb = BMI 47 (extreme obesity)  Weight: 288 lb = BMI 48 (extreme  obesity)  Weight: 294 lb = BMI 49 (extreme obesity)  Weight: 300 lb = BMI 50 (extreme obesity)  Weight: 306 lb = BMI 51 (extreme obesity)  Weight: 312 lb = BMI 52 (extreme obesity)  Weight: 318 lb = BMI 53 (extreme obesity)  Weight: 324 lb = BMI 54 (extreme obesity) Height: 66 inches  Weight: 118 lb = BMI 19 (normal)  Weight: 124 lb = BMI 20 (normal)  Weight: 130 lb = BMI 21 (normal)  Weight: 136 lb = BMI 22 (normal)  Weight: 142 lb = BMI 23 (normal)  Weight: 148 lb = BMI 24 (normal)  Weight: 155 lb = BMI 25 (overweight)  Weight: 161 lb = BMI 26 (overweight)  Weight: 167 lb = BMI 27 (overweight)  Weight: 173 lb = BMI 28 (overweight)  Weight: 179 lb = BMI 29 (overweight)  Weight: 186 lb = BMI 30 (obese)  Weight: 192 lb = BMI 31 (obese)  Weight: 198 lb = BMI 32 (obese)  Weight: 204 lb = BMI 33 (obese)  Weight: 210 lb = BMI 34 (obese)  Weight: 216 lb = BMI 35 (obese)  Weight: 223 lb = BMI 36 (obese)  Weight: 229 lb = BMI 37 (obese)  Weight: 235 lb = BMI 38 (obese)  Weight: 241 lb = BMI 39 (obese)  Weight: 247 lb = BMI 40 (extreme obesity)  Weight: 253 lb = BMI 41 (extreme obesity)  Weight: 260 lb = BMI 42 (extreme obesity)  Weight: 266 lb = BMI 43 (extreme obesity)  Weight: 272 lb = BMI 44 (extreme obesity)  Weight: 278 lb = BMI 45 (extreme obesity)  Weight: 284 lb = BMI 46 (extreme obesity)  Weight: 291 lb = BMI 47 (extreme obesity)  Weight: 297 lb = BMI 48 (extreme obesity)  Weight: 303 lb = BMI 49 (extreme obesity)  Weight: 309 lb = BMI 50 (extreme obesity)  Weight: 315 lb = BMI 51 (extreme obesity)  Weight: 322 lb = BMI 52 (extreme obesity)  Weight: 328 lb = BMI 53 (extreme obesity)  Weight: 334 lb = BMI 54 (extreme obesity) Height: 67 inches  Weight: 121 lb = BMI 19 (normal)  Weight: 127 lb = BMI 20 (normal)  Weight: 134 lb = BMI 21 (normal)  Weight: 140 lb = BMI 22 (normal)  Weight: 146 lb = BMI 23 (normal)  Weight:  153 lb = BMI 24 (normal)  Weight: 159 lb = BMI 25 (overweight)  Weight: 166 lb = BMI 26 (overweight)  Weight: 172 lb = BMI 27 (overweight)  Weight: 178 lb = BMI 28 (overweight)  Weight: 185 lb = BMI 29 (overweight)  Weight: 191 lb = BMI 30 (obese)  Weight: 198 lb = BMI 31 (obese)  Weight: 204 lb = BMI 32 (obese)  Weight: 211 lb = BMI 33 (obese)  Weight: 217 lb = BMI 34 (obese)  Weight: 223 lb = BMI 35 (obese)  Weight: 230 lb = BMI 36 (obese)  Weight: 236 lb = BMI 37 (obese)  Weight: 242 lb = BMI 38 (obese)  Weight: 249 lb = BMI 39 (obese)  Weight: 255 lb = BMI 40 (extreme obesity)  Weight: 261 lb = BMI 41 (extreme obesity)  Weight: 268  lb = BMI 42 (extreme obesity)  Weight: 274 lb = BMI 43 (extreme obesity)  Weight: 280 lb = BMI 44 (extreme obesity)  Weight: 287 lb = BMI 45 (extreme obesity)  Weight: 293 lb = BMI 46 (extreme obesity)  Weight: 299 lb = BMI 47 (extreme obesity)  Weight: 306 lb = BMI 48 (extreme obesity)  Weight: 312 lb = BMI 49 (extreme obesity)  Weight: 319 lb = BMI 50 (extreme obesity)  Weight: 325 lb = BMI 51 (extreme obesity)  Weight: 331 lb = BMI 52 (extreme obesity)  Weight: 338 lb = BMI 53 (extreme obesity)  Weight: 344 lb = BMI 54 (extreme obesity) Height: 68 inches  Weight: 125 lb = BMI 19 (normal)  Weight: 131 lb = BMI 20 (normal)  Weight: 138 lb = BMI 21 (normal)  Weight: 144 lb = BMI 22 (normal)  Weight: 151 lb = BMI 23 (normal)  Weight: 158 lb = BMI 24 (normal)  Weight: 164 lb = BMI 25 (overweight)  Weight: 171 lb = BMI 26 (overweight)  Weight: 177 lb = BMI 27 (overweight)  Weight: 184 lb = BMI 28 (overweight)  Weight: 190 lb = BMI 29 (overweight)  Weight: 197 lb = BMI 30 (obese)  Weight: 203 lb = BMI 31 (obese)  Weight: 210 lb = BMI 32 (obese)  Weight: 216 lb = BMI 33 (obese)  Weight: 223 lb = BMI 34 (obese)  Weight: 230 lb = BMI 35 (obese)  Weight: 236 lb = BMI 36 (obese)  Weight: 243 lb =  BMI 37 (obese)  Weight: 249 lb = BMI 38 (obese)  Weight: 256 lb = BMI 39 (obese)  Weight: 262 lb = BMI 40 (extreme obesity)  Weight: 269 lb = BMI 41 (extreme obesity)  Weight: 276 lb = BMI 42 (extreme obesity)  Weight: 282 lb = BMI 43 (extreme obesity)  Weight: 289 lb = BMI 44 (extreme obesity)  Weight: 295 lb = BMI 45 (extreme obesity)  Weight: 302 lb = BMI 46 (extreme obesity)  Weight: 308 lb = BMI 47 (extreme obesity)  Weight: 315 lb = BMI 48 (extreme obesity)  Weight: 322 lb = BMI 49 (extreme obesity)  Weight: 328 lb = BMI 50 (extreme obesity)  Weight: 335 lb = BMI 51 (extreme obesity)  Weight: 341 lb = BMI 52 (extreme obesity)  Weight: 348 lb = BMI 53 (extreme obesity)  Weight: 354 lb = BMI 54 (extreme obesity) Height: 69 inches  Weight: 128 lb = BMI 19 (normal)  Weight: 135 lb = BMI 20 (normal)  Weight: 142 lb = BMI 21 (normal)  Weight: 149 lb = BMI 22 (normal)  Weight: 155 lb = BMI 23 (normal)  Weight: 162 lb = BMI 24 (normal)  Weight: 169 lb = BMI 25 (overweight)  Weight: 176 lb = BMI 26 (overweight)  Weight: 182 lb = BMI 27 (overweight)  Weight: 189 lb = BMI 28 (overweight)  Weight: 196 lb = BMI 29 (overweight)  Weight: 203 lb = BMI 30 (obese)  Weight: 209 lb = BMI 31 (obese)  Weight: 216 lb = BMI 32 (obese)  Weight: 223 lb = BMI 33 (obese)  Weight: 230 lb = BMI 34 (obese)  Weight: 236 lb = BMI 35 (obese)  Weight: 243 lb = BMI 36 (obese)  Weight: 250 lb = BMI 37 (obese)  Weight: 257 lb = BMI 38 (obese)  Weight: 263 lb = BMI 39 (obese)  Weight: 270 lb = BMI 40 (extreme  obesity)  Weight: 277 lb = BMI 41 (extreme obesity)  Weight: 284 lb = BMI 42 (extreme obesity)  Weight: 291 lb = BMI 43 (extreme obesity)  Weight: 297 lb = BMI 44 (extreme obesity)  Weight: 304 lb = BMI 45 (extreme obesity)  Weight: 311 lb = BMI 46 (extreme obesity)  Weight: 318 lb = BMI 47 (extreme obesity)  Weight: 324 lb = BMI 48 (extreme  obesity)  Weight: 331 lb = BMI 49 (extreme obesity)  Weight: 338 lb = BMI 50 (extreme obesity)  Weight: 345 lb = BMI 51 (extreme obesity)  Weight: 351 lb = BMI 52 (extreme obesity)  Weight: 358 lb = BMI 53 (extreme obesity)  Weight: 365 lb = BMI 54 (extreme obesity) Height: 70 inches  Weight: 132 lb = BMI 19 (normal)  Weight: 139 lb = BMI 20 (normal)  Weight: 146 lb = BMI 21 (normal)  Weight: 153 lb = BMI 22 (normal)  Weight: 160 lb = BMI 23 (normal)  Weight: 167 lb = BMI 24 (normal)  Weight: 174 lb = BMI 25 (overweight)  Weight: 181 lb = BMI 26 (overweight)  Weight: 188 lb = BMI 27 (overweight)  Weight: 195 lb = BMI 28 (overweight)  Weight: 202 lb = BMI 29 (overweight)  Weight: 209 lb = BMI 30 (obese)  Weight: 216 lb = BMI 31 (obese)  Weight: 222 lb = BMI 32 (obese)  Weight: 229 lb = BMI 33 (obese)  Weight: 236 lb = BMI 34 (obese)  Weight: 243 lb = BMI 35 (obese)  Weight: 250 lb = BMI 36 (obese)  Weight: 257 lb = BMI 37 (obese)  Weight: 264 lb = BMI 38 (obese)  Weight: 271 lb = BMI 39 (obese)  Weight: 278 lb = BMI 40 (extreme obesity)  Weight: 285 lb = BMI 41 (extreme obesity)  Weight: 292 lb = BMI 42 (extreme obesity)  Weight: 299 lb = BMI 43 (extreme obesity)  Weight: 306 lb = BMI 44 (extreme obesity)  Weight: 313 lb = BMI 45 (extreme obesity)  Weight: 320 lb = BMI 46 (extreme obesity)  Weight: 327 lb = BMI 47 (extreme obesity)  Weight: 334 lb = BMI 48 (extreme obesity)  Weight: 341 lb = BMI 49 (extreme obesity)  Weight: 348 lb = BMI 50 (extreme obesity)  Weight: 355 lb = BMI 51 (extreme obesity)  Weight: 362 lb = BMI 52 (extreme obesity)  Weight: 369 lb = BMI 53 (extreme obesity)  Weight: 376 lb = BMI 54 (extreme obesity) Height: 71 inches  Weight: 136 lb = BMI 19 (normal)  Weight: 143 lb = BMI 20 (normal)  Weight: 150 lb = BMI 21 (normal)  Weight: 157 lb = BMI 22 (normal)  Weight: 165 lb = BMI 23 (normal)  Weight:  172 lb = BMI 24 (normal)  Weight: 179 lb = BMI 25 (overweight)  Weight: 186 lb = BMI 26 (overweight)  Weight: 193 lb = BMI 27 (overweight)  Weight: 200 lb = BMI 28 (overweight)  Weight: 208 lb = BMI 29 (overweight)  Weight: 215 lb = BMI 30 (obese)  Weight: 222 lb = BMI 31 (obese)  Weight: 229 lb = BMI 32 (obese)  Weight: 236 lb = BMI 33 (obese)  Weight: 243 lb = BMI 34 (obese)  Weight: 250 lb = BMI 35 (obese)  Weight: 257 lb = BMI 36 (obese)  Weight: 265 lb = BMI 37 (obese)  Weight: 272 lb = BMI 38 (obese)  Weight:  279 lb = BMI 39 (obese)  Weight: 286 lb = BMI 40 (extreme obesity)  Weight: 293 lb = BMI 41 (extreme obesity)  Weight: 301 lb = BMI 42 (extreme obesity)  Weight: 308 lb = BMI 43 (extreme obesity)  Weight: 315 lb = BMI 44 (extreme obesity)  Weight: 322 lb = BMI 45 (extreme obesity)  Weight: 329 lb = BMI 46 (extreme obesity)  Weight: 338 lb = BMI 47 (extreme obesity)  Weight: 343 lb = BMI 48 (extreme obesity)  Weight: 351 lb = BMI 49 (extreme obesity)  Weight: 358 lb = BMI 50 (extreme obesity)  Weight: 365 lb = BMI 51 (extreme obesity)  Weight: 372 lb = BMI 52 (extreme obesity)  Weight: 379 lb = BMI 53 (extreme obesity)  Weight: 386 lb = BMI 54 (extreme obesity) Height: 72 inches  Weight: 140 lb = BMI 19 (normal)  Weight: 147 lb = BMI 20 (normal)  Weight: 154 lb = BMI 21 (normal)  Weight: 162 lb = BMI 22 (normal)  Weight: 169 lb = BMI 23 (normal)  Weight: 177 lb = BMI 24 (normal)  Weight: 184 lb = BMI 25 (overweight)  Weight: 191 lb = BMI 26 (overweight)  Weight: 199 lb = BMI 27 (overweight)  Weight: 206 lb = BMI 28 (overweight)  Weight: 213 lb = BMI 29 (overweight)  Weight: 221 lb = BMI 30 (obese)  Weight: 228 lb = BMI 31 (obese)  Weight: 235 lb = BMI 32 (obese)  Weight: 242 lb = BMI 33 (obese)  Weight: 250 lb = BMI 34 (obese)  Weight: 258 lb = BMI 35 (obese)  Weight: 265 lb = BMI 36 (obese)  Weight: 272 lb =  BMI 37 (obese)  Weight: 279 lb = BMI 38 (obese)  Weight: 287 lb = BMI 39 (obese)  Weight: 294 lb = BMI 40 (extreme obesity)  Weight: 302 lb = BMI 41 (extreme obesity)  Weight: 309 lb = BMI 42 (extreme obesity)  Weight: 316 lb = BMI 43 (extreme obesity)  Weight: 324 lb = BMI 44 (extreme obesity)  Weight: 331 lb = BMI 45 (extreme obesity)  Weight: 338 lb = BMI 46 (extreme obesity)  Weight: 346 lb = BMI 47 (extreme obesity)  Weight: 353 lb = BMI 48 (extreme obesity)  Weight: 361 lb = BMI 49 (extreme obesity)  Weight: 368 lb = BMI 50 (extreme obesity)  Weight: 375 lb = BMI 51 (extreme obesity)  Weight: 383 lb = BMI 52 (extreme obesity)  Weight: 390 lb = BMI 53 (extreme obesity)  Weight: 397 lb = BMI 54 (extreme obesity) Height: 73 inches  Weight: 144 lb = BMI 19 (normal)  Weight: 151 lb = BMI 20 (normal)  Weight: 159 lb = BMI 21 (normal)  Weight: 166 lb = BMI 22 (normal)  Weight: 174 lb = BMI 23 (normal)  Weight: 182 lb = BMI 24 (normal)  Weight: 189 lb = BMI 25 (overweight)  Weight: 197 lb = BMI 26 (overweight)  Weight: 204 lb = BMI 27 (overweight)  Weight: 212 lb = BMI 28 (overweight)  Weight: 219 lb = BMI 29 (overweight)  Weight: 227 lb = BMI 30 (obese)  Weight: 235 lb = BMI 31 (obese)  Weight: 242 lb = BMI 32 (obese)  Weight: 250 lb = BMI 33 (obese)  Weight: 257 lb = BMI 34 (obese)  Weight: 265 lb = BMI 35 (obese)  Weight: 272 lb = BMI 36 (obese)  Weight: 280 lb =  BMI 37 (obese)  Weight: 288 lb = BMI 38 (obese)  Weight: 295 lb = BMI 39 (obese)  Weight: 302 lb = BMI 40 (extreme obesity)  Weight: 310 lb = BMI 41 (extreme obesity)  Weight: 318 lb = BMI 42 (extreme obesity)  Weight: 325 lb = BMI 43 (extreme obesity)  Weight: 333 lb = BMI 44 (extreme obesity)  Weight: 340 lb = BMI 45 (extreme obesity)  Weight: 348 lb = BMI 46 (extreme obesity)  Weight: 355 lb = BMI 47 (extreme obesity)  Weight: 363 lb = BMI 48 (extreme  obesity)  Weight: 371 lb = BMI 49 (extreme obesity)  Weight: 378 lb = BMI 50 (extreme obesity)  Weight: 386 lb = BMI 51 (extreme obesity)  Weight: 393 lb = BMI 52 (extreme obesity)  Weight: 401 lb = BMI 53 (extreme obesity)  Weight: 408 lb = BMI 54 (extreme obesity) Height: 74 inches  Weight: 148 lb = BMI 19 (normal)  Weight: 155 lb = BMI 20 (normal)  Weight: 163 lb = BMI 21 (normal)  Weight: 171 lb = BMI 22 (normal)  Weight: 179 lb = BMI 23 (normal)  Weight: 186 lb = BMI 24 (normal)  Weight: 194 lb = BMI 25 (overweight)  Weight: 202 lb = BMI 26 (overweight)  Weight: 210 lb = BMI 27 (overweight)  Weight: 218 lb = BMI 28 (overweight)  Weight: 225 lb = BMI 29 (overweight)  Weight: 233 lb = BMI 30 (obese)  Weight: 241 lb = BMI 31 (obese)  Weight: 249 lb = BMI 32 (obese)  Weight: 256 lb = BMI 33 (obese)  Weight: 264 lb = BMI 34 (obese)  Weight: 272 lb = BMI 35 (obese)  Weight: 280 lb = BMI 36 (obese)  Weight: 287 lb = BMI 37 (obese)  Weight: 295 lb = BMI 38 (obese)  Weight: 303 lb = BMI 39 (obese)  Weight: 311 lb = BMI 40 (extreme obesity)  Weight: 319 lb = BMI 41 (extreme obesity)  Weight: 326 lb = BMI 42 (extreme obesity)  Weight: 334 lb = BMI 43 (extreme obesity)  Weight: 342 lb = BMI 44 (extreme obesity)  Weight: 350 lb = BMI 45 (extreme obesity)  Weight: 358 lb = BMI 46 (extreme obesity)  Weight: 365 lb = BMI 47 (extreme obesity)  Weight: 373 lb = BMI 48 (extreme obesity)  Weight: 381 lb = BMI 49 (extreme obesity)  Weight: 389 lb = BMI 50 (extreme obesity)  Weight: 396 lb = BMI 51 (extreme obesity)  Weight: 404 lb = BMI 52 (extreme obesity)  Weight: 412 lb = BMI 53 (extreme obesity)  Weight: 420 lb = BMI 54 (extreme obesity) Height: 75 inches  Weight: 152 lb = BMI 19 (normal)  Weight: 160 lb = BMI 20 (normal)  Weight: 168 lb = BMI 21 (normal)  Weight: 176 lb = BMI 22 (normal)  Weight: 184 lb = BMI 23 (normal)  Weight:  192 lb = BMI 24 (normal)  Weight: 200 lb = BMI 25 (overweight)  Weight: 208 lb = BMI 26 (overweight)  Weight: 216 lb = BMI 27 (overweight)  Weight: 224 lb = BMI 28 (overweight)  Weight: 232 lb = BMI 29 (overweight)  Weight: 240 lb = BMI 30 (obese)  Weight: 248 lb = BMI 31 (obese)  Weight: 256 lb = BMI 32 (obese)  Weight: 264 lb = BMI 33 (obese)  Weight: 272 lb = BMI 34 (obese)  Weight: 279 lb = BMI 35 (  obese)  Weight: 287 lb = BMI 36 (obese)  Weight: 295 lb = BMI 37 (obese)  Weight: 303 lb = BMI 38 (obese)  Weight: 311 lb = BMI 39 (obese)  Weight: 319 lb = BMI 40 (extreme obesity)  Weight: 327 lb = BMI 41 (extreme obesity)  Weight: 335 lb = BMI 42 (extreme obesity)  Weight: 343 lb = BMI 43 (extreme obesity)  Weight: 351 lb = BMI 44 (extreme obesity)  Weight: 359 lb = BMI 45 (extreme obesity)  Weight: 367 lb = BMI 46 (extreme obesity)  Weight: 375 lb = BMI 47 (extreme obesity)  Weight: 383 lb = BMI 48 (extreme obesity)  Weight: 391 lb = BMI 49 (extreme obesity)  Weight: 399 lb = BMI 50 (extreme obesity)  Weight: 407 lb = BMI 51 (extreme obesity)  Weight: 415 lb = BMI 52 (extreme obesity)  Weight: 423 lb = BMI 53 (extreme obesity)  Weight: 431 lb = BMI 54 (extreme obesity) Height: 76 inches  Weight: 156 lb = BMI 19 (normal)  Weight: 164 lb = BMI 20 (normal)  Weight: 172 lb = BMI 21 (normal)  Weight: 180 lb = BMI 22 (normal)  Weight: 189 lb = BMI 23 (normal)  Weight: 197 lb = BMI 24 (normal)  Weight: 205 lb = BMI 25 (overweight)  Weight: 213 lb = BMI 26 (overweight)  Weight: 221 lb = BMI 27 (overweight)  Weight: 230 lb = BMI 28 (overweight)  Weight: 238 lb = BMI 29 (overweight)  Weight: 246 lb = BMI 30 (obese)  Weight: 254 lb = BMI 31 (obese)  Weight: 263 lb = BMI 32 (obese)  Weight: 271 lb = BMI 33 (obese)  Weight: 279 lb = BMI 34 (obese)  Weight: 287 lb = BMI 35 (obese)  Weight: 295 lb = BMI 36 (obese)  Weight: 304 lb =  BMI 37 (obese)  Weight: 312 lb = BMI 38 (obese)  Weight: 320 lb = BMI 39 (obese)  Weight: 328 lb = BMI 40 (extreme obesity)  Weight: 336 lb = BMI 41 (extreme obesity)  Weight: 344 lb = BMI 42 (extreme obesity)  Weight: 353 lb = BMI 43 (extreme obesity)  Weight: 361 lb = BMI 44 (extreme obesity)  Weight: 369 lb = BMI 45 (extreme obesity)  Weight: 377 lb = BMI 46 (extreme obesity)  Weight: 385 lb = BMI 47 (extreme obesity)  Weight: 394 lb = BMI 48 (extreme obesity)  Weight: 402 lb = BMI 49 (extreme obesity)  Weight: 410 lb = BMI 50 (extreme obesity)  Weight: 418 lb = BMI 51 (extreme obesity)  Weight: 426 lb = BMI 52 (extreme obesity)  Weight: 435 lb = BMI 53 (extreme obesity)  Weight: 443 lb = BMI 54 (extreme obesity) Source: Adapted from Clinical Guidelines on the Identification, Evaluation, and Treatment of Overweight and Obesity in Adults: The Evidence Report. HEALTH RISK CLASSIFICATION ACCORDING TO BODY MASS INDEX (BMI) Classification: Underweight.  BMI Category:  less than 18.5  Risk of developing health problems: Increased. Classification: Normal Weight.  BMI Category: 18.5 to 24.9  Risk of developing health problems: Least. Classification: Overweight.  BMI Category: 25.0 to 29.9  Risk of developing health problems: Increased. Classification: Obese class.  BMI Category:  30.0 to 34.9  Risk of developing health problems: High. Classification: Obese class II.  BMI Category:  35.0 to 39.9  Risk of developing health problems: Very high. Classification: Obese class III.  BMI Category: 40.0 or more  Risk of  developing health problems: Extremely high. Note: For persons 48 years and older the 'normal' range may begin slightly above BMI 18.5 and extend into the 'overweight' range.   To clarify risk for each individual, other factors also need to be considered, such as:  Lifestyle habits.  Fitness level.  Presence or absence of other health risk  conditions.  The classification system may underestimate or overestimate health risks in certain adults, such as:  Highly muscular adults. Very muscular adults, such as athletes, may have a low percentage of body fat but a large amount of muscle tissue. This can result in a BMI in the overweight range that may over estimate the risk of developing health problems.  Adults who naturally have a very lean body build.  Young adults who have not reached full growth.  Adults over 67 years of age. For adults over age 58, more research is needed to determine if the cut-off points for the 'normal weight' range differ in any way from those for younger adults.  It is also important to note that BMI is only one part of a health risk assessment. To further clarify risk, other factors need to be considered as well.  Age, inherited traits, presence or absence of other conditions such as diabetes, high blood lipids, hypertension, and high blood glucose levels also influence the development of diseases associated with overweight. Risk factors such as poor eating habits, physical inactivity, and tobacco use can play a role in the development of diseases associated with both overweight and underweight. Consult a caregiver for a more complete assessment of your weight as it relates to health risk. It is important to discuss with your caregiver what BMI means for you as an individual. Maintaining a 'normal weight' is one element of good health. However, unhealthy eating habits, low levels of physical activity and tobacco use will increase the risk of health problems even for those within the normal weight range.  Being overweight indicates some risk to health. But research suggests that regular physical activity can decrease the risk of several health problems. Equally, a nutritious diet has been shown to decrease some of the risks associated with overweight. It is important to emphasize that a weight classification  system is but one tool to assess health risks in individuals.  Document Released: 03/17/2004 Document Revised: 09/28/2011 Document Reviewed: 04/15/2005 [facility] Patient Information 2015 [place], [place]. This information is not intended to replace advice given to you by your health care Carol Page. Make sure you discuss any questions you have with your health care Carol Page.

## 2015-04-11 NOTE — Progress Notes (Signed)
Pre visit review using our clinic review tool, if applicable. No additional management support is needed unless otherwise documented below in the visit note. 

## 2015-04-11 NOTE — Assessment & Plan Note (Signed)
Support offered today Will start Lexapro Continue Ativan

## 2015-04-16 ENCOUNTER — Other Ambulatory Visit: Payer: Self-pay | Admitting: Internal Medicine

## 2015-04-22 ENCOUNTER — Telehealth: Payer: Self-pay | Admitting: Internal Medicine

## 2015-04-22 NOTE — Telephone Encounter (Signed)
Virgilio Belling called from [facility] stating that pt and daughter have declined home health care. Latonya's call back number is [phone] if you have any questions.

## 2015-04-22 NOTE — Telephone Encounter (Signed)
noted 

## 2015-05-01 ENCOUNTER — Other Ambulatory Visit: Payer: Self-pay | Admitting: Internal Medicine

## 2015-05-01 NOTE — Telephone Encounter (Signed)
Last filled 03/29/15--please advise

## 2015-05-01 NOTE — Telephone Encounter (Signed)
Ok to phone in Ativan 

## 2015-05-01 NOTE — Telephone Encounter (Signed)
rx called into pharmacy

## 2015-05-04 ENCOUNTER — Emergency Department (HOSPITAL_COMMUNITY)
Admission: EM | Admit: 2015-05-04 | Discharge: 2015-05-04 | Disposition: A | Discharge status: Home / Self Care | Payer: Medicare Other | Attending: Emergency Medicine | Admitting: Emergency Medicine

## 2015-05-04 ENCOUNTER — Emergency Department (HOSPITAL_COMMUNITY): Payer: Medicare Other

## 2015-05-04 ENCOUNTER — Encounter (HOSPITAL_COMMUNITY): Payer: Self-pay | Admitting: Vascular Surgery

## 2015-05-04 DIAGNOSIS — F32A Depression, unspecified: Secondary | ICD-10-CM

## 2015-05-04 DIAGNOSIS — R630 Anorexia: Secondary | ICD-10-CM | POA: Diagnosis not present

## 2015-05-04 DIAGNOSIS — M1990 Unspecified osteoarthritis, unspecified site: Secondary | ICD-10-CM | POA: Diagnosis not present

## 2015-05-04 DIAGNOSIS — F419 Anxiety disorder, unspecified: Secondary | ICD-10-CM | POA: Insufficient documentation

## 2015-05-04 DIAGNOSIS — Z85828 Personal history of other malignant neoplasm of skin: Secondary | ICD-10-CM | POA: Insufficient documentation

## 2015-05-04 DIAGNOSIS — F329 Major depressive disorder, single episode, unspecified: Secondary | ICD-10-CM | POA: Insufficient documentation

## 2015-05-04 DIAGNOSIS — I10 Essential (primary) hypertension: Secondary | ICD-10-CM | POA: Insufficient documentation

## 2015-05-04 DIAGNOSIS — F418 Other specified anxiety disorders: Secondary | ICD-10-CM | POA: Diagnosis not present

## 2015-05-04 DIAGNOSIS — Z8673 Personal history of transient ischemic attack (TIA), and cerebral infarction without residual deficits: Secondary | ICD-10-CM | POA: Diagnosis not present

## 2015-05-04 DIAGNOSIS — E785 Hyperlipidemia, unspecified: Secondary | ICD-10-CM | POA: Diagnosis not present

## 2015-05-04 DIAGNOSIS — Z7902 Long term (current) use of antithrombotics/antiplatelets: Secondary | ICD-10-CM | POA: Diagnosis not present

## 2015-05-04 DIAGNOSIS — N390 Urinary tract infection, site not specified: Secondary | ICD-10-CM | POA: Diagnosis not present

## 2015-05-04 DIAGNOSIS — Z872 Personal history of diseases of the skin and subcutaneous tissue: Secondary | ICD-10-CM | POA: Insufficient documentation

## 2015-05-04 DIAGNOSIS — R410 Disorientation, unspecified: Secondary | ICD-10-CM | POA: Diagnosis not present

## 2015-05-04 DIAGNOSIS — R350 Frequency of micturition: Secondary | ICD-10-CM | POA: Diagnosis present

## 2015-05-04 DIAGNOSIS — R32 Unspecified urinary incontinence: Secondary | ICD-10-CM | POA: Diagnosis not present

## 2015-05-04 DIAGNOSIS — Z79899 Other long term (current) drug therapy: Secondary | ICD-10-CM | POA: Diagnosis not present

## 2015-05-04 LAB — CBC WITH DIFFERENTIAL/PLATELET
Basophils Absolute: 0 10*3/uL (ref 0.0–0.1)
Basophils Relative: 0 %
Eosinophils Absolute: 0.2 10*3/uL (ref 0.0–0.7)
Eosinophils Relative: 2 %
HEMATOCRIT: 37.9 % (ref 36.0–46.0)
HEMOGLOBIN: 12.7 g/dL (ref 12.0–15.0)
LYMPHS ABS: 1.7 10*3/uL (ref 0.7–4.0)
LYMPHS PCT: 21 %
MCH: 33.2 pg (ref 26.0–34.0)
MCHC: 33.5 g/dL (ref 30.0–36.0)
MCV: 99.2 fL (ref 78.0–100.0)
MONO ABS: 1.3 10*3/uL — AB (ref 0.1–1.0)
MONOS PCT: 16 %
NEUTROS ABS: 4.9 10*3/uL (ref 1.7–7.7)
NEUTROS PCT: 61 %
Platelets: 174 10*3/uL (ref 150–400)
RBC: 3.82 MIL/uL — ABNORMAL LOW (ref 3.87–5.11)
RDW: 12.6 % (ref 11.5–15.5)
WBC: 8.1 10*3/uL (ref 4.0–10.5)

## 2015-05-04 LAB — COMPREHENSIVE METABOLIC PANEL
ALK PHOS: 81 U/L (ref 38–126)
ALT: 31 U/L (ref 14–54)
ANION GAP: 9 (ref 5–15)
AST: 22 U/L (ref 15–41)
Albumin: 3.6 g/dL (ref 3.5–5.0)
BILIRUBIN TOTAL: 0.4 mg/dL (ref 0.3–1.2)
BUN: 20 mg/dL (ref 6–20)
CALCIUM: 8.8 mg/dL — AB (ref 8.9–10.3)
CO2: 28 mmol/L (ref 22–32)
Chloride: 94 mmol/L — ABNORMAL LOW (ref 101–111)
Creatinine, Ser: 0.79 mg/dL (ref 0.44–1.00)
GFR calc Af Amer: >60 mL/min (ref >60)
GLUCOSE: 90 mg/dL (ref 65–99)
POTASSIUM: 4 mmol/L (ref 3.5–5.1)
Sodium: 131 mmol/L — ABNORMAL LOW (ref 135–145)
TOTAL PROTEIN: 6.4 g/dL — AB (ref 6.5–8.1)

## 2015-05-04 LAB — I-STAT TROPONIN, ED: TROPONIN I, POC: 0 ng/mL (ref 0.00–0.08)

## 2015-05-04 LAB — URINE MICROSCOPIC-ADD ON

## 2015-05-04 LAB — URINALYSIS, ROUTINE W REFLEX MICROSCOPIC
Bilirubin Urine: NEGATIVE
GLUCOSE, UA: NEGATIVE mg/dL
KETONES UR: NEGATIVE mg/dL
Nitrite: NEGATIVE
PH: 6 (ref 5.0–8.0)
Protein, ur: NEGATIVE mg/dL
SPECIFIC GRAVITY, URINE: 1.017 (ref 1.005–1.030)
Urobilinogen, UA: 1 mg/dL (ref 0.0–1.0)

## 2015-05-04 MED ORDER — NITROFURANTOIN MONOHYD MACRO 100 MG PO CAPS
100.0000 mg | ORAL_CAPSULE | Freq: Once | ORAL | Status: AC
  Administered 2015-05-04: 100 mg via ORAL
  Filled 2015-05-04: qty 1

## 2015-05-04 MED ORDER — NITROFURANTOIN MONOHYD MACRO 100 MG PO CAPS: 100.0000 mg | ORAL_CAPSULE | Freq: Two times a day (BID) | ORAL | Status: DC

## 2015-05-04 NOTE — ED Notes (Signed)
Pt reports to the ED for eval of confusion and urinary frequency. Pt A&Ox4 at this time. Had some nausea this am but none now. No active V/D. Per family she was looking for her dad to come visit her but he has been dead for 42 years. Pt afebrile at this time. Resp e/u and skin warm and dry.

## 2015-05-04 NOTE — ED Notes (Signed)
Pt at CT

## 2015-05-04 NOTE — Discharge Instructions (Signed)
Delirium Delirium is a state of mental confusion. It comes on quickly and causes significant changes in a person's thinking and behavior. People with delirium usually have trouble paying attention to what is going on or knowing where they are. They may become very withdrawn or very emotional and unable to sit still. They may even see or feel things that are not there (hallucinations). Delirium is a sign of a serious underlying medical condition. CAUSES Delirium occurs when something suddenly affects the signals that the brain sends out. Brain signals can be affected by anything that puts severe stress on the body and brain and causes brain chemicals to be out of balance. The most common causes of delirium include:  Infections. These may be bacterial, viral, fungal, or protozoal.  Medicines. These include many over-the-counter and prescription medicines.  Recreational drugs.  Substance withdrawal. This occurs with sudden discontinuation of alcohol, certain medicines, or recreational drugs.  Surgery.  Sudden vascular events, such as stroke, brain hemorrhage, and severe migraine.  Other brain disorders, such as tumors, seizures, and physical head trauma.  Metabolic disorders, such as kidney or liver failure.  Low blood oxygen (anoxia). This may occur with lung disease, cardiac arrest, or carbon monoxide poisoning.  Hormone imbalances (endocrinopathies), such as an overactive thyroid (hyperthyroidism) or underactive thyroid (hypothyroidism).  Vitamin deficiencies. RISK FACTORS This condition is more likely to develop in:  Children.  Older people.  People who live alone.  People who have vision loss or hearing loss.  People who have existing brain disease, such as dementia.  People who have long-lasting (chronic) medical conditions, such as heart disease.  People who are hospitalized for long periods of time. SYMPTOMS Delirium starts with a sudden change in a person's thinking  or behavior. Symptoms come and go (fluctuate) over time, and they are often worse at the end of the day. Symptoms include:  Not being able to stay awake (drowsiness) or pay attention.  Being confused about places, time, and people.  Forgetfulness.  Having extreme energy levels. These may be low or high.  Changes in sleep patterns.  Extreme mood swings, such as anger or anxiety.  Focusing on things or ideas that are not important.  Rambling and senseless talking.  Difficulty speaking, understanding speech, or both.  Hallucinations.  Tremor or unsteady gait. DIAGNOSIS People with delirium may not realize that they have the condition. Often, a family member or health care provider is the first person to notice the changes. The health care provider will obtain a detailed history of current symptoms, medical issues, medicines, and recreational drug use. The health care provider will perform a mental status examination by:  Asking questions to check for confusion.  Watching for abnormal behavior. The health care provider may perform a physical exam and order lab tests or additional studies to determine the cause of the delirium. TREATMENT Treatment of delirium depends on the cause and severity. Delirium usually goes away within days or weeks of treating the underlying cause. In the meantime, the person should not be left alone because he or she may accidentally cause self-harm. Treatment includes supportive care, such as:  Increased light during the day and decreased light at night.  Low noise level.  Uninterrupted sleep.  A regular daily schedule.  Clocks and calendars to help with orientation.  Familiar objects, including the person's pictures and clothing.  Frequent visits from familiar family and friends.  Healthy diet.  Exercise. In more severe cases of delirium, medicine may be prescribed  to help the person to keep calm and think more clearly. HOME CARE  INSTRUCTIONS  Any supportive care should be continued as told by the health care provider.  All medicines should be used as told by the health care provider. This is important.  The health care provider should be consulted before over-the-counter medicines, herbs, or supplements are used.  All follow-up visits should be kept as told by the health care provider. This is important.  Alcohol and recreational drugs should be avoided as told by the health care provider. SEEK MEDICAL CARE IF:  Symptoms do not get better or they become worse.  New symptoms of delirium develop.  Caring for the person at home does not seem safe.  Eating, drinking, or communicating stops.  There are side effects of medicines, such as changes in sleep patterns, dizziness, weight gain, restlessness, movement changes, or tremors. SEEK IMMEDIATE MEDICAL CARE IF:  Serious thoughts occur about self-harm or about hurting others.  There are serious side effects of medicine, such as:  Swelling of the face, lips, tongue, or throat.  Fever, confusion, muscle spasms, or seizures.   This information is not intended to replace advice given to you by your health care provider. Make sure you discuss any questions you have with your health care provider.   Document Released: 03/30/2012 Document Revised: 11/20/2014 Document Reviewed: 08/29/2014 Elsevier Interactive Patient Education 2016 Elsevier Inc[employer]  Urinary Tract Infection A urinary tract infection (UTI) can occur any place along the urinary tract. The tract includes the kidneys, ureters, bladder, and urethra. A type of germ called bacteria often causes a UTI. UTIs are often helped with antibiotic medicine.  HOME CARE   If given, take antibiotics as told by your doctor. Finish them even if you start to feel better.  Drink enough fluids to keep your pee (urine) clear or pale yellow.  Avoid tea, drinks with caffeine, and bubbly (carbonated) drinks.  Pee often.  Avoid holding your pee in for a long time.  Pee before and after having sex (intercourse).  Wipe from front to back after you poop (bowel movement) if you are a woman. Use each tissue only once. GET HELP RIGHT AWAY IF:   You have back pain.  You have lower belly (abdominal) pain.  You have chills.  You feel sick to your stomach (nauseous).  You throw up (vomit).  Your burning or discomfort with peeing does not go away.  You have a fever.  Your symptoms are not better in 3 days. MAKE SURE YOU:   Understand these instructions.  Will watch your condition.  Will get help right away if you are not doing well or get worse.   This information is not intended to replace advice given to you by your health care provider. Make sure you discuss any questions you have with your health care provider.   Document Released: 12/23/2007 Document Revised: 07/27/2014 Document Reviewed: 02/04/2012 Elsevier Interactive Patient Education [employer].

## 2015-05-04 NOTE — ED Notes (Signed)
MD at bedside. 

## 2015-05-04 NOTE — ED Provider Notes (Signed)
CSN: [id number]     Arrival date & time 05/04/15  1446 History   First MD Initiated Contact with Patient 05/04/15 1507     Chief Complaint  Patient presents with  . Urinary Frequency  . Altered Mental Status   Patient is a 79 y.o. female presenting with altered mental status.  Altered Mental Status Presenting symptoms: confusion and disorientation   Presenting symptoms: no combativeness and no partial responsiveness   Severity:  Moderate Most recent episode:  Today Episode history:  Single Timing:  Intermittent Progression:  Waxing and waning Chronicity:  Recurrent Context: head injury   Context: not dementia, not homeless, not a recent change in medication, not a recent illness and not a recent infection   Recent head injury:  Unable to specify Associated symptoms: no abdominal pain, no fever, no headaches, no nausea, no palpitations and no vomiting     Past Medical History  Diagnosis Date  . Stroke ([place])   . Hypertension   . Hyperlipidemia   . Arthritis   . Depression   . Ulcer   . History of blood transfusion   . Urinary incontinence   . Allergy   . Skin cancer of nose   . Cellulitis of left leg   . Pre-diabetes    Past Surgical History  Procedure Laterality Date  . Tonsillectomy  1944  . Tubal ligation    . Bilateral oophorectomy  06/2012    ovarian cysts  . Skin cancer excision  2007    reomved from nose   Family History  Problem Relation Age of Onset  . Heart disease Other     Parent  . Cancer Maternal Uncle    Social History  Substance Use Topics  . Smoking status: Never Smoker   . Smokeless tobacco: Never Used  . Alcohol Use: No   OB History    No data available     Review of Systems  Constitutional: Positive for appetite change. Negative for fever and fatigue.  HENT: Negative for congestion.   Respiratory: Negative for cough, chest tightness and shortness of breath.   Cardiovascular: Negative for chest pain, palpitations and leg swelling.   Gastrointestinal: Negative for nausea, vomiting, abdominal pain, diarrhea and constipation.  Genitourinary: Positive for frequency. Negative for dysuria.  Musculoskeletal: Negative for arthralgias.  Neurological: Negative for syncope, facial asymmetry, numbness and headaches.  Psychiatric/Behavioral: Positive for confusion.  All other systems reviewed and are negative.     Allergies  Codeine and Ether  Home Medications   Prior to Admission medications   Medication Sig Start Date End Date Taking? Authorizing Provider  amitriptyline (ELAVIL) 25 MG tablet TAKE ONE TABLET BY MOUTH AT BEDTIME 04/17/15   Jearld Fenton, NP  atorvastatin (LIPITOR) 80 MG tablet TAKE ONE TABLET BY MOUTH ONCE DAILY. 10/13/14   Jearld Fenton, NP  bisacodyl (DULCOLAX) 5 MG EC tablet Take 5 mg by mouth daily as needed for moderate constipation.    Historical Provider, MD  CARTIA XT 180 MG 24 hr capsule TAKE ONE CAPSULE BY MOUTH ONCE DAILY 11/12/14   Jearld Fenton, NP  Cholecalciferol 2000 UNITS TABS Take 2,000 Units by mouth every morning.     Historical Provider, MD  clopidogrel (PLAVIX) 75 MG tablet TAKE ONE TABLET BY MOUTH IN THE EVENING 11/12/14   Jearld Fenton, NP  diphenhydramine-acetaminophen (TYLENOL PM) 25-500 MG TABS Take 1 tablet by mouth 2 (two) times daily.     Historical Provider, MD  escitalopram Loma Sousa)  10 MG tablet Take 1 tablet (10 mg total) by mouth daily. 04/11/15   Jearld Fenton, NP  gabapentin (NEURONTIN) 300 MG capsule TAKE THREE CAPSULES BY MOUTH IN THE MORNING, TWO CAPSULES BY MOUTH IN THE AFTERNOON AND TWO CAPSULES BY MOUTH AT BEDTIME 03/28/15   Jearld Fenton, NP  hydrochlorothiazide (MICROZIDE) 12.5 MG capsule TAKE ONE CAPSULE BY MOUTH ONCE DAILY 11/12/14   Jearld Fenton, NP  lisinopril (PRINIVIL,ZESTRIL) 10 MG tablet TAKE ONE TABLET BY MOUTH ONCE DAILY 04/17/15   Jearld Fenton, NP  LORazepam (ATIVAN) 1 MG tablet TAKE ONE TABLET BY MOUTH ONCE DAILY AS NEEDED 05/01/15   Jearld Fenton, NP   nitroGLYCERIN (NITROSTAT) 0.4 MG SL tablet Place 1 tablet (0.4 mg total) under the tongue every 5 (five) minutes as needed for chest pain. 10/05/13   Jearld Fenton, NP  oxybutynin (DITROPAN) 5 MG tablet Take 1 tablet (5 mg total) by mouth 3 (three) times daily. 04/16/14   Jearld Fenton, NP  oxybutynin (DITROPAN) 5 MG tablet TAKE ONE TABLET BY MOUTH THREE TIMES DAILY 04/17/15   Jearld Fenton, NP  phenytoin (DILANTIN) 100 MG ER capsule TAKE TWO CAPSULES BY MOUTH TWICE DAILY 04/17/15   Jearld Fenton, NP  traZODone (DESYREL) 50 MG tablet TAKE THREE TABLETS BY MOUTH AT BEDTIME 11/12/14   Jearld Fenton, NP  vitamin B-12 (CYANOCOBALAMIN) 1000 MCG tablet Take 1,000 mcg by mouth daily.    Historical Provider, MD   BP 102/48 mmHg  Pulse 73  Temp(Src) 97.3 F (36.3 C) (Oral)  Resp 16  SpO2 100% Physical Exam  Constitutional: She appears well-developed and well-nourished. No distress.  HENT:  Head: Normocephalic and atraumatic.  Eyes: Pupils are equal, round, and reactive to light.  Neck: Normal range of motion.  Cardiovascular: Normal rate and intact distal pulses.   No murmur heard. Pulmonary/Chest: Effort normal. No respiratory distress. She has no wheezes. She has no rales.  Abdominal:  Diffuse minimal abdominal pain without rebound or guarding  Musculoskeletal: Normal range of motion. She exhibits no edema.  Neurological: She is alert. No cranial nerve deficit. She exhibits normal muscle tone. Coordination normal.  Skin: Skin is warm and dry. She is not diaphoretic. No erythema.  Psychiatric: She has a normal mood and affect. Judgment and thought content normal.  Depressed mood, flat affect  Nursing note and vitals reviewed.   ED Course  Procedures (including critical care time) Labs Review Labs Reviewed  URINALYSIS, ROUTINE W REFLEX MICROSCOPIC (NOT AT [facility]) - Abnormal; Notable for the following:    APPearance TURBID (*)    Hgb urine dipstick MODERATE (*)    Leukocytes, UA LARGE  (*)    All other components within normal limits  CBC WITH DIFFERENTIAL/PLATELET - Abnormal; Notable for the following:    RBC 3.82 (*)    Monocytes Absolute 1.3 (*)    All other components within normal limits  COMPREHENSIVE METABOLIC PANEL - Abnormal; Notable for the following:    Sodium 131 (*)    Chloride 94 (*)    Calcium 8.8 (*)    Total Protein 6.4 (*)    All other components within normal limits  URINE MICROSCOPIC-ADD ON - Abnormal; Notable for the following:    Bacteria, UA MANY (*)    All other components within normal limits  URINE CULTURE  I-STAT TROPOININ, ED    Imaging Review Ct Head Wo Contrast  05/04/2015  CLINICAL DATA:  Confusion.  Delirium.  Nausea.  Previous stroke. EXAM: CT HEAD WITHOUT CONTRAST TECHNIQUE: Contiguous axial images were obtained from the base of the skull through the vertex without intravenous contrast. COMPARISON:  04/16/2014 FINDINGS: There is no evidence of intracranial hemorrhage, brain edema, or other signs of acute infarction. There is no evidence of intracranial mass lesion or mass effect. No abnormal extraaxial fluid collections are identified. Mild to moderate cerebral atrophy remains stable. Moderate chronic small vessel disease is also unchanged in appearance. Ventricles are stable in size. No skull abnormality identified. IMPRESSION: No acute intracranial abnormality. Stable cerebral atrophy and chronic small vessel disease. Electronically Signed   By: Earle Gell M.D.   On: 05/04/2015 16:13   I have personally reviewed and evaluated these images and lab results as part of my medical decision-making.  MDM    She presents emergency department today with delirium. Daughter and son state that she's been having this waxing and waning of confusion today as well as increase in urinary frequency. She has a history of the same. He also endorses that had somewhat decrease in her oral intake but otherwise has been well. She's had no fevers, no cough,  no shortness of breath.  Patient has no new changes in her medication other than Lyrica that started 2 weeks ago. Patient is also endorses some falls. Her son and daughter said this is normal for her. No changes in bowel movements (last BM yesterday). Patient does have a history of depression as well suffer from pressure over the past few years.  CT negative  Currently patient is alert and oriented 4. She has urinary frequency and her urine is suggestive of a UTI. Patient has minimal diffuse abdominal tenderness but family states this is normal for her and declines any additional imaging.   Patient given macrobid and discharged home in good health with strict return precautions. Son, Gershon Mussel and daughter, [name], in agreement with plan.   Final diagnoses:  UTI (lower urinary tract infection)  Anxiety and depression  Incontinence  Delirium      Roberto Scales, MD 05/04/15 1736  Debby Freiberg, MD 05/05/15 2007

## 2015-05-09 LAB — URINE CULTURE: Culture: >=100000

## 2015-05-10 ENCOUNTER — Telehealth (HOSPITAL_COMMUNITY): Payer: Self-pay

## 2015-05-10 NOTE — Telephone Encounter (Signed)
Post ED Visit - Positive Culture Follow-up: Successful Patient Follow-Up  Culture assessed and recommendations reviewed by: []  Heide Guile, Pharm.D., BCPS-AQ ID []  Alycia Rossetti, Pharm.D., BCPS []  [place], [place].D., BCPS, AAHIVP []  Legrand Como, Pharm.D., BCPS, AAHIVP []  [place], Pharm.D. []  Milus Glazier, [place].D. Darlina Rumpf, Pharm.D.  Positive urine culture, >/= 100,000 colonies  -> Viridans Streptococcus  []  Patient discharged without antimicrobial prescription and treatment is now indicated [x]  Organism is resistant to prescribed ED discharge antimicrobial, Nitrofurantoin []  Patient with positive blood cultures  Changes discussed with ED provider:  Changes discussed with ED provider:H. Snyder PA New antibiotic prescription "Keflex 500 mg every 12 hrs x 7 days" Called to  Date 05/10/2015, time 11:55  LVM for pt requesting callback.   Dortha Kern 05/10/2015, 11:28 AM

## 2015-05-10 NOTE — Progress Notes (Signed)
ED Antimicrobial Stewardship Positive Culture Follow Up   Carol Page is an 79 y.o. female who presented to [facility] on 05/04/2015 with a chief complaint of  Chief Complaint  Patient presents with  . Urinary Frequency  . Altered Mental Status    Recent Results (from the past 720 hour(s))  Urine culture     Status: None   Collection Time: 05/04/15  3:05 PM  Result Value Ref Range Status   Specimen Description URINE, RANDOM  Final   Special Requests NONE  Final   Culture >=100,000 COLONIES/mL VIRIDANS STREPTOCOCCUS  Final   Report Status 05/09/2015 FINAL  Final   New antibiotic prescription: Keflex 500mg  Q 12 x 7days  ED Provider: Abigail Butts, PA-C  Assessment: 31 yoF presenting to ED with complaints of urinary freq and AMS. Treated with Macrbid UCx now growing Viridans strep susceptibilities for Macrobid not known. Plan change therapy to Keflex 500mg  Q 12 x 7days  Darl Pikes, PharmD Clinical Pharmacist- Resident Pager: [phone]  Darl Pikes 05/10/2015, 9:18 AM Infectious Diseases Pharmacist Phone# [phone]

## 2015-05-11 ENCOUNTER — Telehealth (HOSPITAL_COMMUNITY): Payer: Self-pay

## 2015-05-12 ENCOUNTER — Telehealth (HOSPITAL_COMMUNITY): Payer: Self-pay

## 2015-05-12 NOTE — Telephone Encounter (Deleted)
Spoke with pt. Verified ID. Informed of labs. Treated per protocol. DHHS form faxed. Pt informed to abstain from sexual activity x 10 days and to notify partner for testing and treatment.  

## 2015-05-12 NOTE — Telephone Encounter (Signed)
Unable to reach by telephone. Letter sent to address on record.

## 2015-05-15 ENCOUNTER — Other Ambulatory Visit: Payer: Self-pay | Admitting: Internal Medicine

## 2015-05-20 ENCOUNTER — Other Ambulatory Visit: Payer: Self-pay | Admitting: Internal Medicine

## 2015-05-24 ENCOUNTER — Other Ambulatory Visit: Payer: Self-pay | Admitting: Internal Medicine

## 2015-05-25 ENCOUNTER — Telehealth (HOSPITAL_COMMUNITY): Payer: Self-pay

## 2015-05-25 NOTE — Telephone Encounter (Signed)
Unable to contact pt by mail or telephone. Unable to communicate lab results or treatment changes. 

## 2015-05-27 ENCOUNTER — Other Ambulatory Visit: Payer: Self-pay | Admitting: Internal Medicine

## 2015-05-27 MED ORDER — AMITRIPTYLINE HCL 25 MG PO TABS: 25.0000 mg | ORAL_TABLET | Freq: Every day | ORAL | Status: DC

## 2015-05-27 NOTE — Addendum Note (Signed)
Addended by: Lurlean Nanny on: 05/27/2015 02:48 PM   Modules accepted: Orders

## 2015-06-15 ENCOUNTER — Other Ambulatory Visit: Payer: Self-pay | Admitting: Internal Medicine

## 2015-06-26 ENCOUNTER — Other Ambulatory Visit: Payer: Self-pay | Admitting: Internal Medicine

## 2015-06-26 NOTE — Telephone Encounter (Signed)
Ativan last filled 05/01/2015---please advise

## 2015-06-26 NOTE — Telephone Encounter (Signed)
Rx called in to pharmacy. 

## 2015-06-26 NOTE — Telephone Encounter (Signed)
Ok to phone in [place], other meds sent electronically

## 2015-07-19 ENCOUNTER — Other Ambulatory Visit: Payer: Self-pay | Admitting: Internal Medicine

## 2015-07-31 ENCOUNTER — Other Ambulatory Visit: Payer: Self-pay | Admitting: Internal Medicine

## 2015-08-01 ENCOUNTER — Other Ambulatory Visit: Payer: Self-pay | Admitting: Internal Medicine

## 2015-08-01 NOTE — Telephone Encounter (Signed)
Last filled 06/26/2015--please advise

## 2015-08-01 NOTE — Telephone Encounter (Signed)
Rx called in to pharmacy. 

## 2015-08-01 NOTE — Telephone Encounter (Signed)
Ok to phone it Ativan

## 2015-08-13 ENCOUNTER — Encounter (HOSPITAL_COMMUNITY): Payer: Self-pay

## 2015-08-13 ENCOUNTER — Inpatient Hospital Stay (HOSPITAL_COMMUNITY)
Admission: EM | Admit: 2015-08-13 | Discharge: 2015-08-23 | DRG: 871 | Disposition: A | Discharge status: Skilled Nursing Facility | Payer: Medicare Other | Attending: Family Medicine | Admitting: Family Medicine

## 2015-08-13 ENCOUNTER — Emergency Department (HOSPITAL_COMMUNITY): Payer: Medicare Other

## 2015-08-13 DIAGNOSIS — J9811 Atelectasis: Secondary | ICD-10-CM | POA: Diagnosis not present

## 2015-08-13 DIAGNOSIS — F319 Bipolar disorder, unspecified: Secondary | ICD-10-CM | POA: Diagnosis present

## 2015-08-13 DIAGNOSIS — M6281 Muscle weakness (generalized): Secondary | ICD-10-CM | POA: Diagnosis not present

## 2015-08-13 DIAGNOSIS — E876 Hypokalemia: Secondary | ICD-10-CM | POA: Diagnosis present

## 2015-08-13 DIAGNOSIS — R140 Abdominal distension (gaseous): Secondary | ICD-10-CM | POA: Diagnosis not present

## 2015-08-13 DIAGNOSIS — Z85828 Personal history of other malignant neoplasm of skin: Secondary | ICD-10-CM | POA: Diagnosis not present

## 2015-08-13 DIAGNOSIS — R1030 Lower abdominal pain, unspecified: Secondary | ICD-10-CM | POA: Diagnosis not present

## 2015-08-13 DIAGNOSIS — R0600 Dyspnea, unspecified: Secondary | ICD-10-CM | POA: Diagnosis not present

## 2015-08-13 DIAGNOSIS — Z66 Do not resuscitate: Secondary | ICD-10-CM | POA: Diagnosis present

## 2015-08-13 DIAGNOSIS — F419 Anxiety disorder, unspecified: Secondary | ICD-10-CM | POA: Diagnosis present

## 2015-08-13 DIAGNOSIS — N390 Urinary tract infection, site not specified: Secondary | ICD-10-CM | POA: Diagnosis not present

## 2015-08-13 DIAGNOSIS — Z9181 History of falling: Secondary | ICD-10-CM | POA: Diagnosis not present

## 2015-08-13 DIAGNOSIS — R195 Other fecal abnormalities: Secondary | ICD-10-CM

## 2015-08-13 DIAGNOSIS — Z515 Encounter for palliative care: Secondary | ICD-10-CM | POA: Insufficient documentation

## 2015-08-13 DIAGNOSIS — K5900 Constipation, unspecified: Secondary | ICD-10-CM | POA: Diagnosis present

## 2015-08-13 DIAGNOSIS — R109 Unspecified abdominal pain: Secondary | ICD-10-CM

## 2015-08-13 DIAGNOSIS — E785 Hyperlipidemia, unspecified: Secondary | ICD-10-CM | POA: Diagnosis present

## 2015-08-13 DIAGNOSIS — E872 Acidosis, unspecified: Secondary | ICD-10-CM | POA: Diagnosis present

## 2015-08-13 DIAGNOSIS — Z7401 Bed confinement status: Secondary | ICD-10-CM | POA: Diagnosis not present

## 2015-08-13 DIAGNOSIS — R55 Syncope and collapse: Secondary | ICD-10-CM | POA: Diagnosis not present

## 2015-08-13 DIAGNOSIS — Z7189 Other specified counseling: Secondary | ICD-10-CM | POA: Diagnosis not present

## 2015-08-13 DIAGNOSIS — Z6841 Body Mass Index (BMI) 40.0 and over, adult: Secondary | ICD-10-CM | POA: Diagnosis not present

## 2015-08-13 DIAGNOSIS — R32 Unspecified urinary incontinence: Secondary | ICD-10-CM | POA: Diagnosis present

## 2015-08-13 DIAGNOSIS — G92 Toxic encephalopathy: Secondary | ICD-10-CM | POA: Diagnosis present

## 2015-08-13 DIAGNOSIS — Z888 Allergy status to other drugs, medicaments and biological substances status: Secondary | ICD-10-CM | POA: Diagnosis not present

## 2015-08-13 DIAGNOSIS — K529 Noninfective gastroenteritis and colitis, unspecified: Secondary | ICD-10-CM | POA: Diagnosis present

## 2015-08-13 DIAGNOSIS — A419 Sepsis, unspecified organism: Secondary | ICD-10-CM | POA: Diagnosis not present

## 2015-08-13 DIAGNOSIS — E860 Dehydration: Secondary | ICD-10-CM | POA: Diagnosis present

## 2015-08-13 DIAGNOSIS — R531 Weakness: Secondary | ICD-10-CM | POA: Diagnosis not present

## 2015-08-13 DIAGNOSIS — R1084 Generalized abdominal pain: Secondary | ICD-10-CM | POA: Diagnosis not present

## 2015-08-13 DIAGNOSIS — R7303 Prediabetes: Secondary | ICD-10-CM | POA: Diagnosis present

## 2015-08-13 DIAGNOSIS — Z7902 Long term (current) use of antithrombotics/antiplatelets: Secondary | ICD-10-CM

## 2015-08-13 DIAGNOSIS — R29898 Other symptoms and signs involving the musculoskeletal system: Secondary | ICD-10-CM | POA: Diagnosis not present

## 2015-08-13 DIAGNOSIS — I10 Essential (primary) hypertension: Secondary | ICD-10-CM | POA: Diagnosis not present

## 2015-08-13 DIAGNOSIS — R509 Fever, unspecified: Secondary | ICD-10-CM

## 2015-08-13 DIAGNOSIS — Z885 Allergy status to narcotic agent status: Secondary | ICD-10-CM

## 2015-08-13 DIAGNOSIS — R1012 Left upper quadrant pain: Secondary | ICD-10-CM | POA: Diagnosis not present

## 2015-08-13 DIAGNOSIS — F418 Other specified anxiety disorders: Secondary | ICD-10-CM | POA: Diagnosis not present

## 2015-08-13 DIAGNOSIS — R000 Tachycardia, unspecified: Secondary | ICD-10-CM | POA: Diagnosis not present

## 2015-08-13 DIAGNOSIS — N179 Acute kidney failure, unspecified: Secondary | ICD-10-CM | POA: Diagnosis present

## 2015-08-13 DIAGNOSIS — R402421 Glasgow coma scale score 9-12, in the field [EMT or ambulance]: Secondary | ICD-10-CM | POA: Diagnosis not present

## 2015-08-13 DIAGNOSIS — R2689 Other abnormalities of gait and mobility: Secondary | ICD-10-CM | POA: Diagnosis not present

## 2015-08-13 DIAGNOSIS — G40909 Epilepsy, unspecified, not intractable, without status epilepticus: Secondary | ICD-10-CM | POA: Diagnosis present

## 2015-08-13 DIAGNOSIS — Z79899 Other long term (current) drug therapy: Secondary | ICD-10-CM | POA: Diagnosis not present

## 2015-08-13 DIAGNOSIS — Z8673 Personal history of transient ischemic attack (TIA), and cerebral infarction without residual deficits: Secondary | ICD-10-CM

## 2015-08-13 DIAGNOSIS — R062 Wheezing: Secondary | ICD-10-CM

## 2015-08-13 DIAGNOSIS — S42309D Unspecified fracture of shaft of humerus, unspecified arm, subsequent encounter for fracture with routine healing: Secondary | ICD-10-CM | POA: Diagnosis not present

## 2015-08-13 LAB — INFLUENZA PANEL BY PCR (TYPE A & B)
H1N1FLUPCR: NOT DETECTED
Influenza A By PCR: NEGATIVE
Influenza B By PCR: NEGATIVE

## 2015-08-13 LAB — URINALYSIS, ROUTINE W REFLEX MICROSCOPIC
Glucose, UA: NEGATIVE mg/dL
HGB URINE DIPSTICK: NEGATIVE
KETONES UR: 15 mg/dL — AB
Nitrite: POSITIVE — AB
PROTEIN: NEGATIVE mg/dL
Specific Gravity, Urine: 1.024 (ref 1.005–1.030)
pH: 5 (ref 5.0–8.0)

## 2015-08-13 LAB — URINE MICROSCOPIC-ADD ON

## 2015-08-13 LAB — COMPREHENSIVE METABOLIC PANEL
ALBUMIN: 3.6 g/dL (ref 3.5–5.0)
ALT: 34 U/L (ref 14–54)
AST: 34 U/L (ref 15–41)
Alkaline Phosphatase: 93 U/L (ref 38–126)
Anion gap: 17 — ABNORMAL HIGH (ref 5–15)
BUN: 29 mg/dL — AB (ref 6–20)
CHLORIDE: 100 mmol/L — AB (ref 101–111)
CO2: 19 mmol/L — AB (ref 22–32)
CREATININE: 1.54 mg/dL — AB (ref 0.44–1.00)
Calcium: 9.2 mg/dL (ref 8.9–10.3)
GFR calc Af Amer: 36 mL/min — ABNORMAL LOW (ref >60)
GFR calc non Af Amer: 31 mL/min — ABNORMAL LOW (ref >60)
Glucose, Bld: 176 mg/dL — ABNORMAL HIGH (ref 65–99)
POTASSIUM: 4.6 mmol/L (ref 3.5–5.1)
SODIUM: 136 mmol/L (ref 135–145)
Total Bilirubin: 0.8 mg/dL (ref 0.3–1.2)
Total Protein: 6.6 g/dL (ref 6.5–8.1)

## 2015-08-13 LAB — CORTISOL: Cortisol, Plasma: 37.4 ug/dL

## 2015-08-13 LAB — CBC WITH DIFFERENTIAL/PLATELET
BASOS ABS: 0 10*3/uL (ref 0.0–0.1)
BASOS PCT: 0 %
EOS ABS: 0 10*3/uL (ref 0.0–0.7)
EOS PCT: 0 %
HCT: 41.6 % (ref 36.0–46.0)
Hemoglobin: 13.7 g/dL (ref 12.0–15.0)
Lymphocytes Relative: 3 %
Lymphs Abs: 0.5 10*3/uL — ABNORMAL LOW (ref 0.7–4.0)
MCH: 33.7 pg (ref 26.0–34.0)
MCHC: 32.9 g/dL (ref 30.0–36.0)
MCV: 102.2 fL — ABNORMAL HIGH (ref 78.0–100.0)
Monocytes Absolute: 1.5 10*3/uL — ABNORMAL HIGH (ref 0.1–1.0)
Monocytes Relative: 9 %
Neutro Abs: 14 10*3/uL — ABNORMAL HIGH (ref 1.7–7.7)
Neutrophils Relative %: 88 %
PLATELETS: 217 10*3/uL (ref 150–400)
RBC: 4.07 MIL/uL (ref 3.87–5.11)
RDW: 12.9 % (ref 11.5–15.5)
WBC: 16 10*3/uL — AB (ref 4.0–10.5)

## 2015-08-13 LAB — PROTIME-INR
INR: 1.22 (ref 0.00–1.49)
PROTHROMBIN TIME: 15.6 s — AB (ref 11.6–15.2)

## 2015-08-13 LAB — PHENYTOIN LEVEL, TOTAL: PHENYTOIN LVL: 21.1 ug/mL — AB (ref 10.0–20.0)

## 2015-08-13 LAB — LIPASE, BLOOD: Lipase: 29 U/L (ref 11–51)

## 2015-08-13 LAB — LACTIC ACID, PLASMA: LACTIC ACID, VENOUS: 3 mmol/L — AB (ref 0.5–2.0)

## 2015-08-13 LAB — APTT: aPTT: 28 seconds (ref 24–37)

## 2015-08-13 LAB — PROCALCITONIN: Procalcitonin: 4.43 ng/mL

## 2015-08-13 LAB — I-STAT CG4 LACTIC ACID, ED: LACTIC ACID, VENOUS: 4.4 mmol/L — AB (ref 0.5–2.0)

## 2015-08-13 LAB — MRSA PCR SCREENING: MRSA BY PCR: NEGATIVE

## 2015-08-13 LAB — GLUCOSE, CAPILLARY: GLUCOSE-CAPILLARY: 149 mg/dL — AB (ref 65–99)

## 2015-08-13 MED ORDER — VANCOMYCIN HCL IN DEXTROSE 1-5 GM/200ML-% IV SOLN
1000.0000 mg | Freq: Once | INTRAVENOUS | Status: DC
  Administered 2015-08-13: 1000 mg via INTRAVENOUS
  Filled 2015-08-13: qty 200

## 2015-08-13 MED ORDER — ONDANSETRON HCL 4 MG/2ML IJ SOLN: 4.0000 mg | Freq: Four times a day (QID) | INTRAMUSCULAR | Status: DC | PRN

## 2015-08-13 MED ORDER — DEXTROSE 5 % IV SOLN
2.0000 g | Freq: Once | INTRAVENOUS | Status: AC
  Administered 2015-08-13: 2 g via INTRAVENOUS
  Filled 2015-08-13: qty 2

## 2015-08-13 MED ORDER — ACETAMINOPHEN 650 MG RE SUPP
650.0000 mg | Freq: Four times a day (QID) | RECTAL | Status: DC | PRN
  Administered 2015-08-13 – 2015-08-15 (×6): 650 mg via RECTAL
  Filled 2015-08-13 (×6): qty 1

## 2015-08-13 MED ORDER — SODIUM CHLORIDE 0.9% FLUSH
10.0000 mL | INTRAVENOUS | Status: DC | PRN
  Administered 2015-08-19: 10 mL
  Filled 2015-08-13: qty 40

## 2015-08-13 MED ORDER — SODIUM CHLORIDE 0.9 % IV SOLN
INTRAVENOUS | Status: DC
  Administered 2015-08-13: 17:00:00 via INTRAVENOUS
  Administered 2015-08-14: 1000 mL via INTRAVENOUS

## 2015-08-13 MED ORDER — VANCOMYCIN HCL IN DEXTROSE 1-5 GM/200ML-% IV SOLN
1000.0000 mg | Freq: Once | INTRAVENOUS | Status: AC
  Administered 2015-08-13: 1000 mg via INTRAVENOUS
  Filled 2015-08-13: qty 200

## 2015-08-13 MED ORDER — PHENYTOIN SODIUM 50 MG/ML IJ SOLN
100.0000 mg | Freq: Three times a day (TID) | INTRAMUSCULAR | Status: DC
  Administered 2015-08-13 – 2015-08-17 (×13): 100 mg via INTRAVENOUS
  Filled 2015-08-13 (×21): qty 2

## 2015-08-13 MED ORDER — ACETAMINOPHEN 325 MG PO TABS
650.0000 mg | ORAL_TABLET | Freq: Four times a day (QID) | ORAL | Status: DC | PRN
  Administered 2015-08-20: 650 mg via ORAL
  Filled 2015-08-13 (×2): qty 2

## 2015-08-13 MED ORDER — ONDANSETRON HCL 4 MG PO TABS: 4.0000 mg | ORAL_TABLET | Freq: Four times a day (QID) | ORAL | Status: DC | PRN

## 2015-08-13 MED ORDER — INSULIN ASPART 100 UNIT/ML ~~LOC~~ SOLN
0.0000 [IU] | SUBCUTANEOUS | Status: DC
  Administered 2015-08-13: 0 [IU] via SUBCUTANEOUS
  Administered 2015-08-13: 1 [IU] via SUBCUTANEOUS
  Administered 2015-08-14: 0 [IU] via SUBCUTANEOUS
  Administered 2015-08-14: 1 [IU] via SUBCUTANEOUS
  Administered 2015-08-14: 0 [IU] via SUBCUTANEOUS
  Administered 2015-08-14 – 2015-08-17 (×11): 1 [IU] via SUBCUTANEOUS
  Administered 2015-08-17: 2 [IU] via SUBCUTANEOUS
  Administered 2015-08-17 – 2015-08-18 (×4): 1 [IU] via SUBCUTANEOUS
  Administered 2015-08-19: 2 [IU] via SUBCUTANEOUS
  Administered 2015-08-19 – 2015-08-20 (×6): 1 [IU] via SUBCUTANEOUS

## 2015-08-13 MED ORDER — SODIUM CHLORIDE 0.9 % IV BOLUS (SEPSIS)
1000.0000 mL | INTRAVENOUS | Status: AC
  Administered 2015-08-13 (×3): 1000 mL via INTRAVENOUS

## 2015-08-13 MED ORDER — SODIUM CHLORIDE 0.9% FLUSH
3.0000 mL | Freq: Two times a day (BID) | INTRAVENOUS | Status: DC
  Administered 2015-08-13 – 2015-08-19 (×11): 3 mL via INTRAVENOUS

## 2015-08-13 MED ORDER — SODIUM CHLORIDE 0.9 % IV BOLUS (SEPSIS)
500.0000 mL | INTRAVENOUS | Status: AC
  Administered 2015-08-13: 500 mL via INTRAVENOUS

## 2015-08-13 MED ORDER — HEPARIN SODIUM (PORCINE) 5000 UNIT/ML IJ SOLN
5000.0000 [IU] | Freq: Three times a day (TID) | INTRAMUSCULAR | Status: DC
  Administered 2015-08-13 – 2015-08-14 (×2): 5000 [IU] via SUBCUTANEOUS
  Filled 2015-08-13 (×2): qty 1

## 2015-08-13 MED ORDER — SODIUM CHLORIDE 0.9% FLUSH
10.0000 mL | Freq: Two times a day (BID) | INTRAVENOUS | Status: DC
  Administered 2015-08-13 – 2015-08-15 (×3): 10 mL
  Administered 2015-08-16 – 2015-08-17 (×2): 40 mL
  Administered 2015-08-18 – 2015-08-21 (×6): 10 mL

## 2015-08-13 MED ORDER — SODIUM CHLORIDE 0.9 % IV BOLUS (SEPSIS)
500.0000 mL | Freq: Once | INTRAVENOUS | Status: AC
  Administered 2015-08-13: 500 mL via INTRAVENOUS

## 2015-08-13 MED ORDER — PIPERACILLIN-TAZOBACTAM 3.375 G IVPB 30 MIN
3.3750 g | Freq: Once | INTRAVENOUS | Status: AC
  Administered 2015-08-13: 3.375 g via INTRAVENOUS
  Filled 2015-08-13: qty 50

## 2015-08-13 MED ORDER — DEXTROSE 5 % IV SOLN
1.0000 g | INTRAVENOUS | Status: DC
  Filled 2015-08-13: qty 10

## 2015-08-13 NOTE — H&P (Signed)
[facility] History and Physical                                                                                    Carol Page, is a 80 y.o. female  MRN: [id number]   DOB - 12-02-1935  Admit Date - 08/13/2015  Outpatient Primary MD for the patient is Webb Silversmith, NP  Referring MD: [facility] / ER   PMH: Past Medical History  Diagnosis Date  . Stroke ([place])   . Hypertension   . Hyperlipidemia   . Arthritis   . Depression   . Ulcer   . History of blood transfusion   . Urinary incontinence   . Allergy   . Skin cancer of nose   . Cellulitis of left leg   . Pre-diabetes       PSH: Past Surgical History  Procedure Laterality Date  . Tonsillectomy  1944  . Tubal ligation    . Bilateral oophorectomy  06/2012    ovarian cysts  . Skin cancer excision  2007    reomved from nose     CC:  Chief Complaint  Patient presents with  . Altered Mental Status     HPI: This is a 80 year old female patient with history of hypertension, seizure disorder, prediabetes, chronic urinary incontinence, dyslipidemia, severe obesity, prior CVA, anxiety and depression and history of chronic leg pain who was sent to the ER by her family due to altered mentation. At time of my assessment patient no family was at the bedside. Per ED provider and documentation in the EMR EMS reported that the patient's daughter stated that the patient was last seen normal at 9 PM last night on 1/23 and when the family came by to check her this morning she was less responsive and confused. It was documented she had been drowsy with "UTI type symptoms" based on family report. CBG was 154 at the scene. Because of significant altered mentation patient was to contribute to history portion of exam. Of note patient arrived with a completed DO NOT RESUSCITATE form in place.  ER Evaluation and treatment: Per ER notes, RN initial palpable blood pressure was 0000000 systolic; formal vital signs in ER as follows: Rectal  temperature 102F, EP 97/47, pulse 108 and respirations 20 with room air saturations 100%-significant altered mentation and per RN report patient briefly responded yes to questions asked but mental status has deteriorated somewhat since arrival to ER PCXR: No infiltrate or edema but study limited by poor inspiratory effort CT head without contrast: Atrophy and chronic small vessel ischemic changes without acute focal abnormality or generalized cerebral edema EKG: Sinus tachycardia with ventricular rate in 170s beats per minute, QTC 455 ms, elevated J point in inferior lateral leads without ischemic changes Abnormal labs: Serum CO2 19, BUN 29 and creatinine 1.5 for baseline creatinine 0.79, anion gap 17, lactic acid 4.4, WBC 16,000 with neutrophils 88% and absolute neutrophils 14%, UA: abnormal with large bilirubin -hyaline casts -amber colored-ketones 15-small leukocytes-positive nitrite-specific gravity 1.024- WBC 0-5 (urine culture and blood cultures obtained in ER) Normal saline fluid bolus 2.5 L Zosyn 3.375 g IV 1 Vancomycin 1000 mg IV 1  Review of Systems   In addition to the HPI above,  *History limited given patient inability to participate in exam and no family at bedside during my examination-history has been obtained from the EMR   Social History Social History  Substance Use Topics  . Smoking status: Never Smoker   . Smokeless tobacco: Never Used  . Alcohol Use: No    Resides at: Private residence  Lives with: Alone  Ambulatory status: Unknown   Family History Family History  Problem Relation Age of Onset  . Heart disease Other     Parent  . Cancer Maternal Uncle      Prior to Admission medications   Medication Sig Start Date End Date Taking? Authorizing Provider  acetaminophen (TYLENOL) 500 MG tablet Take 1,000 mg by mouth every 8 (eight) hours.    Yes Historical Provider, MD  amitriptyline (ELAVIL) 25 MG tablet Take 1 tablet (25 mg total) by mouth at bedtime.  05/27/15  Yes Jearld Fenton, NP  atorvastatin (LIPITOR) 80 MG tablet TAKE ONE TABLET BY MOUTH ONCE DAILY 05/15/15  Yes Jearld Fenton, NP  CARTIA XT 180 MG 24 hr capsule TAKE ONE CAPSULE BY MOUTH ONCE DAILY 05/15/15  Yes Jearld Fenton, NP  clopidogrel (PLAVIX) 75 MG tablet TAKE ONE TABLET BY MOUTH IN THE EVENING 05/15/15  Yes Jearld Fenton, NP  gabapentin (NEURONTIN) 100 MG capsule Take 200-300 mg by mouth 3 (three) times daily. Pt takes 300mg  in am, 200mg  at 1400 and 200mg  at bedtime   Yes Historical Provider, MD  hydrochlorothiazide (MICROZIDE) 12.5 MG capsule TAKE ONE CAPSULE BY MOUTH ONCE DAILY 05/15/15  Yes Jearld Fenton, NP  lisinopril (PRINIVIL,ZESTRIL) 5 MG tablet Take 5 mg by mouth daily.   Yes Historical Provider, MD  LORazepam (ATIVAN) 0.5 MG tablet Take 0.5 mg by mouth daily.   Yes Historical Provider, MD  nitroGLYCERIN (NITROSTAT) 0.4 MG SL tablet Place 1 tablet (0.4 mg total) under the tongue every 5 (five) minutes as needed for chest pain. 10/05/13  Yes Jearld Fenton, NP  oxybutynin (DITROPAN) 5 MG tablet TAKE ONE TABLET BY MOUTH THREE TIMES DAILY 05/27/15  Yes Jearld Fenton, NP  phenytoin (DILANTIN) 100 MG ER capsule TAKE TWO CAPSULES BY MOUTH TWICE DAILY 05/27/15  Yes Jearld Fenton, NP  traZODone (DESYREL) 50 MG tablet TAKE THREE TABLETS BY MOUTH AT BEDTIME 07/19/15  Yes Jearld Fenton, NP  vitamin B-12 (CYANOCOBALAMIN) 1000 MCG tablet Take 1,000 mcg by mouth daily.   Yes Historical Provider, MD  CARTIA XT 180 MG 24 hr capsule TAKE ONE CAPSULE BY MOUTH ONCE DAILY Patient not taking: Reported on 08/13/2015 05/27/15   Jearld Fenton, NP  escitalopram (LEXAPRO) 10 MG tablet TAKE ONE TABLET BY MOUTH ONCE DAILY Patient not taking: Reported on 08/13/2015 06/26/15   Jearld Fenton, NP  gabapentin (NEURONTIN) 300 MG capsule TAKE THREE CAPSULES BY MOUTH IN THE MORNING, TWO CAPSULES BY MOUTH IN THE AFTERNOON AND TWO CAPSULES BY MOUTH AT BEDTIME Patient not taking: Reported on 08/13/2015  06/26/15   Jearld Fenton, NP  lisinopril (PRINIVIL,ZESTRIL) 10 MG tablet TAKE ONE TABLET BY MOUTH ONCE DAILY Patient not taking: Reported on 08/13/2015 05/15/15   Jearld Fenton, NP  lisinopril (PRINIVIL,ZESTRIL) 10 MG tablet TAKE ONE TABLET BY MOUTH ONCE DAILY Patient not taking: Reported on 08/13/2015 05/27/15   Jearld Fenton, NP  LORazepam (ATIVAN) 1 MG tablet TAKE ONE TABLET BY MOUTH ONCE DAILY AS NEEDED Patient  not taking: Reported on 08/13/2015 08/01/15   Jearld Fenton, NP  nitrofurantoin, macrocrystal-monohydrate, (MACROBID) 100 MG capsule Take 1 capsule (100 mg total) by mouth 2 (two) times daily. Patient not taking: Reported on 08/13/2015 05/04/15   Roberto Scales, MD  oxybutynin (DITROPAN) 5 MG tablet TAKE ONE TABLET BY MOUTH THREE TIMES DAILY Patient not taking: Reported on 08/13/2015 05/15/15   Jearld Fenton, NP  phenytoin (DILANTIN) 100 MG ER capsule TAKE TWO CAPSULES BY MOUTH TWICE DAILY Patient not taking: Reported on 08/13/2015 05/15/15   Jearld Fenton, NP    Allergies  Allergen Reactions  . Codeine Nausea And Vomiting  . Ether Nausea And Vomiting    Physical Exam  Vitals  Blood pressure 106/83, pulse 107, temperature 102 F (38.9 C), temperature source Rectal, resp. rate 29, SpO2 97 %.   General: In moderate significant distress as evidenced by significant altered mental status and ongoing rigors  Psych: Lethargic and nonverbal, eyes are open but nonfocused, spontaneously lifts head off bed  Neuro:   No obvious focal neurological deficits, CN II through XII appear to be intact, and able to accurately assess strength or sensation given patient's current altered mentation and inability to participate with exam  ENT:  Ears and Eyes appear Normal, Conjunctivae clear sclerae injected, PER. I oral mucosa without erythema or exudates.  Neck:  Supple, No lymphadenopathy appreciated  Respiratory:  Symmetrical chest wall movement, Good air movement bilaterally, CTAB. Room  Air  Cardiac:  RRR, No Murmurs, no LE edema noted, no JVD, No carotid bruits, peripheral pulses palpable at 2+  Abdomen:  Positive bowel sounds, Soft, Non tender, Non distended,  No masses appreciated, no obvious hepatosplenomegaly  Skin: Extremities cool to the touch with mottled appearance, capillary refill greater than 2 seconds  Genitourinary: Chronic urinary incontinence-required I/O catheterization to obtain urine specimen  Extremities: Symmetrical without obvious trauma or injury,  no effusions.  Data Review  CBC  Recent Labs Lab 08/13/15 1100  WBC 16.0*  HGB 13.7  HCT 41.6  PLT 217  MCV 102.2*  MCH 33.7  MCHC 32.9  RDW 12.9  LYMPHSABS 0.5*  MONOABS 1.5*  EOSABS 0.0  BASOSABS 0.0    Chemistries   Recent Labs Lab 08/13/15 1100  NA 136  K 4.6  CL 100*  CO2 19*  GLUCOSE 176*  BUN 29*  CREATININE 1.54*  CALCIUM 9.2  AST 34  ALT 34  ALKPHOS 93  BILITOT 0.8    CrCl cannot be calculated (Unknown ideal weight.).  No results for input(s): TSH, T4TOTAL, T3FREE, THYROIDAB in the last 72 hours.  Invalid input(s): FREET3  Coagulation profile No results for input(s): INR, PROTIME in the last 168 hours.  No results for input(s): DDIMER in the last 72 hours.  Cardiac Enzymes No results for input(s): CKMB, TROPONINI, MYOGLOBIN in the last 168 hours.  Invalid input(s): CK  Invalid input(s): POCBNP  Urinalysis    Component Value Date/Time   COLORURINE AMBER* 08/13/2015 1107   APPEARANCEUR CLEAR 08/13/2015 1107   LABSPEC 1.024 08/13/2015 1107   PHURINE 5.0 08/13/2015 1107   GLUCOSEU NEGATIVE 08/13/2015 1107   GLUCOSEU NEGATIVE 11/09/2012 1537   HGBUR NEGATIVE 08/13/2015 1107   BILIRUBINUR LARGE* 08/13/2015 1107   KETONESUR 15* 08/13/2015 1107   PROTEINUR NEGATIVE 08/13/2015 1107   UROBILINOGEN 1.0 05/04/2015 1505   NITRITE POSITIVE* 08/13/2015 1107   LEUKOCYTESUR SMALL* 08/13/2015 1107    Imaging results:   Ct Head Wo Contrast  08/13/2015  CLINICAL DATA:  Found down and unresponsive. Bilateral leg weakness with sluggish pupil response. EXAM: CT HEAD WITHOUT CONTRAST TECHNIQUE: Contiguous axial images were obtained from the base of the skull through the vertex without intravenous contrast. COMPARISON:  Head CT 05/04/2015 and 04/08/2014. FINDINGS: There is no evidence of acute intracranial hemorrhage, mass lesion, brain edema or extra-axial fluid collection. The ventricles and subarachnoid spaces are prominent but stable. There is no CT evidence of acute cortical infarction. There are stable chronic small vessel ischemic changes in the periventricular white matter and basal ganglia bilaterally. There is a small mucous retention cyst inferiorly in the right maxillary sinus. There is chronic partial left mastoid air cell opacification. The visualized paranasal sinuses, mastoid air cells and middle ears are otherwise clear. The calvarium is intact. IMPRESSION: Stable head CT demonstrating atrophy and chronic small vessel ischemic changes. No generalized cerebral edema or acute focal abnormality identified. Electronically Signed   By: Richardean Sale M.D.   On: 08/13/2015 11:58   Dg Chest Port 1 View  08/13/2015  CLINICAL DATA:  Altered mental status, tachycardia EXAM: PORTABLE CHEST 1 VIEW COMPARISON:  04/16/2014 FINDINGS: Cardiomediastinal silhouette is stable. Study is limited by poor inspiration. Stable bilateral basilar atelectasis or scarring. No segmental infiltrate or pulmonary edema. IMPRESSION: No infiltrate or pulmonary edema. Limited study by poor inspiration. Stable bilateral basilar atelectasis or scarring. Electronically Signed   By: Lahoma Crocker M.D.   On: 08/13/2015 11:08     EKG: (Independently reviewed)  Sinus tachycardia with ventricular rate in 170s beats per minute, QTC 455 ms, elevated J point in inferior lateral leads without ischemic changes   Assessment & Plan  Principal Problem:   Sepsis due to urinary tract infection    -Based on clinical exam/diagnostic workup thus far and family's report of patient with what they perceive as typical UTI symptoms source appears to be urinary tract -SDU/Inpt admit -Prognosis appears poor at this juncture given patient's poor perfusion and significant altered mentation -Sepsis physiology: Significant altered mentation, relative hypotension, serum lactate greater than 4.0, WBCs greater than 14,000, evolving organ dysfunction with acute kidney injury and metabolic acidemia -Chek PCT, cortisol, PT/INR/PTT -Repeat lactic acid post-hydration see if improved -Continue volume resuscitation with IV fluids at 150 mL/h-pressure remains suboptimal and in the 90-100 range with MAP >65 -Narrow antibiotics to Rocephin to cover urinary tract only since chest x-ray negative in no apparent respiratory symptoms prior to admission -As precaution check influenza PCR and respiratory viral panel -Has poor venous access so asked for urgent PICC line placement per IV team  Active Problems:   AKI / Metabolic acidemia -Related to sepsis physiology, dehydration as well as preadmission use of thiazide diuretic and ACE inhibitor -Hold offending medications -Hydrate as above -Labs in a.m.    Essential hypertension -Currently with relative hypotension so holding preadmission car she takes T, Microzide, Prinivil    Seizure disorder  -On Dilantin prior to admission -Check Dilantin level -PICC line as best option for infusing IV Dilantin    Prediabetes -Check CBG every 4 hours -SSI    Urinary incontinence -Foley catheter for accurate I/O setting of sepsis and aggressive volume repletion; it is to be critical unstable patient    HLD  -Preadmission statin on hold    Severe obesity (BMI >= 40)      History of CVA  -Preadmission Plavix and statin on hold     DVT Prophylaxis: Subcutaneous heparin  Family Communication:   No family at bedside during  our examination  Code Status:  DO NOT  RESUSCITATE  Condition:  Guarded noting patient is critically ill and concern is patient may not survive the next 48 hours  Discharge disposition: Unknown at this time-if patient survives we'll likely need formal PT OT evaluation to determine if appropriate to return to home environment  Time spent in minutes : 60      Minoru Chap L. ANP on 08/13/2015 at 1:42 PM  You may contact me by going to [email or web address] - password [email or web address]  I am available from 7a-7p but please confirm I am on the schedule by going to Amion as above.   After 7p please contact night coverage person covering me after hours  [facility]

## 2015-08-13 NOTE — ED Notes (Signed)
Myself and Chelsea, RN assisted Brooke, RN with a rectal temperature

## 2015-08-13 NOTE — ED Notes (Signed)
Per EMS - pt from home. LSN 2100. Pt family found pt unresponsive this morning. Pt drowsy w/ some UTI symptoms (per family). Hx stroke 30 yrs ago. Bilateral leg weakness and pupils sluggish. ST on cardiac monitor. CBG 154. BP 160 palp.

## 2015-08-13 NOTE — Progress Notes (Signed)
Called by primary RN to assess pt for possible facial droop.  Per report the pt has been altered since this am and was found to be febrile and with lactic acidosis in the [facility].  On assessment the pt will not open her eyes but localizes to pain and says "ouch" clearly with a sternal rub. No facial droop is appreciated either at rest or with facial grimacing.  Pt is generally weak and pulls her BUE when raised.  She flickers to stimulus on BLE and drops them when lifted.  PEERL.  Suggest cont. Monitoring by the primary RN but not a code stroke at this time.

## 2015-08-13 NOTE — Progress Notes (Signed)
Peripherally Inserted Central Catheter/Midline Placement  The IV Nurse has discussed with the patient and/or persons authorized to consent for the patient, the purpose of this procedure and the potential benefits and risks involved with this procedure.  The benefits include less needle sticks, lab draws from the catheter and patient may be discharged home with the catheter.  Risks include, but not limited to, infection, bleeding, blood clot (thrombus formation), and puncture of an artery; nerve damage and irregular heat beat.  Alternatives to this procedure were also discussed.  PICC/Midline Placement Documentation  PICC Double Lumen 08/13/15 PICC Right Brachial 42 cm 2 cm (Active)  Indication for Insertion or Continuance of Line Poor Vasculature-patient has had multiple peripheral attempts or PIVs lasting less than 24 hours 08/13/2015  6:00 PM  Exposed Catheter (cm) 2 cm 08/13/2015  6:00 PM  Dressing Change Due 08/20/15 08/13/2015  6:00 PM    Telephone consent   Jule Economy Horton 08/13/2015, 6:41 PM

## 2015-08-13 NOTE — Progress Notes (Signed)
Repeat lactic acid has decreased to 3.0 and PCT elevated at 4.43. Will cycle lactate for two more collections to help guide volume resuscitation  Coags with mildly elevated PT, Dilantin level slightly elevated at 21.1- will cont Dilantin dose ordered at admit- rec repeat level in 48 hrs  Erin Hearing, ANP

## 2015-08-13 NOTE — ED Notes (Signed)
Ok per Erin Hearing, NP for foley to be placed on floor

## 2015-08-13 NOTE — ED Notes (Signed)
If needed call Pierce Crane [phone]

## 2015-08-13 NOTE — ED Notes (Signed)
Assisted Jerene Pitch, RN with foley cath on patient; Jerene Pitch, RN was unsuccessful; Admitting PA stated she would have the floor put it in; visitor at bedside

## 2015-08-13 NOTE — ED Notes (Signed)
Assisted Brooke, RN with in and out cath; also changed patient's diaper and placed a clean one on her with chuks underneath; placed patient in gown; visitor at bedside

## 2015-08-13 NOTE — ED Notes (Signed)
Patient has returned from being out of the department; patient placed back on monitor, continuous pulse oximetry and blood pressure cuff 

## 2015-08-13 NOTE — ED Provider Notes (Signed)
CSN: [id number]     Arrival date & time 08/13/15  1015 History   First MD Initiated Contact with Patient 08/13/15 1017     Chief Complaint  Patient presents with  . Altered Mental Status     (Consider location/radiation/quality/duration/timing/severity/associated sxs/prior Treatment) HPI Comments: 80 year old female with past medical history including CVA, hypertension, hyperlipidemia who presents with altered mental status. History limited because of the patient's altered mentation and obtained primarily from EMS. EMS reports that the patient's daughter states she was last seen normal at 9 PM last night. Her family found her less responsive and altered this morning. She has been drowsy with some "UTI symptoms" according to family. The glucose 154 during transport.  The history is provided by the EMS personnel.    Past Medical History  Diagnosis Date  . Stroke ([place])   . Hypertension   . Hyperlipidemia   . Arthritis   . Depression   . Ulcer   . History of blood transfusion   . Urinary incontinence   . Allergy   . Skin cancer of nose   . Cellulitis of left leg   . Pre-diabetes    Past Surgical History  Procedure Laterality Date  . Tonsillectomy  1944  . Tubal ligation    . Bilateral oophorectomy  06/2012    ovarian cysts  . Skin cancer excision  2007    reomved from nose   Family History  Problem Relation Age of Onset  . Heart disease Other     Parent  . Cancer Maternal Uncle    Social History  Substance Use Topics  . Smoking status: Never Smoker   . Smokeless tobacco: Never Used  . Alcohol Use: No   OB History    No data available     Review of Systems  Unable to perform ROS: Mental status change      Allergies  Codeine and Ether  Home Medications   Prior to Admission medications   Medication Sig Start Date End Date Taking? Authorizing Provider  acetaminophen (TYLENOL) 500 MG tablet Take 1,000 mg by mouth every 8 (eight) hours.    Yes Historical  Provider, MD  amitriptyline (ELAVIL) 25 MG tablet Take 1 tablet (25 mg total) by mouth at bedtime. 05/27/15  Yes Jearld Fenton, NP  atorvastatin (LIPITOR) 80 MG tablet TAKE ONE TABLET BY MOUTH ONCE DAILY 05/15/15  Yes Jearld Fenton, NP  CARTIA XT 180 MG 24 hr capsule TAKE ONE CAPSULE BY MOUTH ONCE DAILY 05/15/15  Yes Jearld Fenton, NP  clopidogrel (PLAVIX) 75 MG tablet TAKE ONE TABLET BY MOUTH IN THE EVENING 05/15/15  Yes Jearld Fenton, NP  gabapentin (NEURONTIN) 100 MG capsule Take 200-300 mg by mouth 3 (three) times daily. Pt takes 300mg  in am, 200mg  at 1400 and 200mg  at bedtime   Yes Historical Provider, MD  hydrochlorothiazide (MICROZIDE) 12.5 MG capsule TAKE ONE CAPSULE BY MOUTH ONCE DAILY 05/15/15  Yes Jearld Fenton, NP  lisinopril (PRINIVIL,ZESTRIL) 5 MG tablet Take 5 mg by mouth daily.   Yes Historical Provider, MD  LORazepam (ATIVAN) 0.5 MG tablet Take 0.5 mg by mouth daily.   Yes Historical Provider, MD  nitroGLYCERIN (NITROSTAT) 0.4 MG SL tablet Place 1 tablet (0.4 mg total) under the tongue every 5 (five) minutes as needed for chest pain. 10/05/13  Yes Jearld Fenton, NP  oxybutynin (DITROPAN) 5 MG tablet TAKE ONE TABLET BY MOUTH THREE TIMES DAILY 05/27/15  Yes Jearld Fenton, NP  phenytoin (DILANTIN) 100 MG ER capsule TAKE TWO CAPSULES BY MOUTH TWICE DAILY 05/27/15  Yes Jearld Fenton, NP  traZODone (DESYREL) 50 MG tablet TAKE THREE TABLETS BY MOUTH AT BEDTIME 07/19/15  Yes Jearld Fenton, NP  vitamin B-12 (CYANOCOBALAMIN) 1000 MCG tablet Take 1,000 mcg by mouth daily.   Yes Historical Provider, MD  CARTIA XT 180 MG 24 hr capsule TAKE ONE CAPSULE BY MOUTH ONCE DAILY Patient not taking: Reported on 08/13/2015 05/27/15   Jearld Fenton, NP  escitalopram (LEXAPRO) 10 MG tablet TAKE ONE TABLET BY MOUTH ONCE DAILY Patient not taking: Reported on 08/13/2015 06/26/15   Jearld Fenton, NP  gabapentin (NEURONTIN) 300 MG capsule TAKE THREE CAPSULES BY MOUTH IN THE MORNING, TWO CAPSULES BY MOUTH IN  THE AFTERNOON AND TWO CAPSULES BY MOUTH AT BEDTIME Patient not taking: Reported on 08/13/2015 06/26/15   Jearld Fenton, NP  lisinopril (PRINIVIL,ZESTRIL) 10 MG tablet TAKE ONE TABLET BY MOUTH ONCE DAILY Patient not taking: Reported on 08/13/2015 05/15/15   Jearld Fenton, NP  lisinopril (PRINIVIL,ZESTRIL) 10 MG tablet TAKE ONE TABLET BY MOUTH ONCE DAILY Patient not taking: Reported on 08/13/2015 05/27/15   Jearld Fenton, NP  LORazepam (ATIVAN) 1 MG tablet TAKE ONE TABLET BY MOUTH ONCE DAILY AS NEEDED Patient not taking: Reported on 08/13/2015 08/01/15   Jearld Fenton, NP  nitrofurantoin, macrocrystal-monohydrate, (MACROBID) 100 MG capsule Take 1 capsule (100 mg total) by mouth 2 (two) times daily. Patient not taking: Reported on 08/13/2015 05/04/15   Roberto Scales, MD  oxybutynin (DITROPAN) 5 MG tablet TAKE ONE TABLET BY MOUTH THREE TIMES DAILY Patient not taking: Reported on 08/13/2015 05/15/15   Jearld Fenton, NP  phenytoin (DILANTIN) 100 MG ER capsule TAKE TWO CAPSULES BY MOUTH TWICE DAILY Patient not taking: Reported on 08/13/2015 05/15/15   Jearld Fenton, NP   BP 106/83 mmHg  Pulse 107  Temp(Src) 102 F (38.9 C) (Rectal)  Resp 29  SpO2 97% Physical Exam  Constitutional: She appears well-developed and well-nourished. No distress.  Somnolent  HENT:  Head: Normocephalic and atraumatic.  Dry mucous membranes  Eyes: Conjunctivae are normal. Pupils are equal, round, and reactive to light.  Neck: Neck supple.  Cardiovascular: Regular rhythm and normal heart sounds.   No murmur heard. Tachycardic  Pulmonary/Chest: Effort normal and breath sounds normal.  Abdominal: Soft. Bowel sounds are normal. She exhibits no distension. There is no tenderness.  Musculoskeletal:  1+ BLE edema  Neurological:  Eyes closed but will open to voice, does not follow commands but moves all 4 extremities  Skin: Skin is warm and dry.  Nursing note and vitals reviewed.   ED Course  .Critical  Care Performed by: Sharlett Iles Authorized by: Sharlett Iles Total critical care time: 40 minutes Critical care time was exclusive of separately billable procedures and treating other patients. Critical care was necessary to treat or prevent imminent or life-threatening deterioration of the following conditions: sepsis. Critical care was time spent personally by me on the following activities: development of treatment plan with patient or surrogate, evaluation of patient's response to treatment, examination of patient, obtaining history from patient or surrogate, ordering and performing treatments and interventions, ordering and review of laboratory studies, ordering and review of radiographic studies, re-evaluation of patient's condition and review of old charts.   (including critical care time) Labs Review Labs Reviewed  URINALYSIS, [place] (NOT AT [facility], [place]) - Abnormal; Notable for the following:  Color, Urine AMBER (*)    Bilirubin Urine LARGE (*)    Ketones, ur 15 (*)    Nitrite POSITIVE (*)    Leukocytes, UA SMALL (*)    All other components within normal limits  CBC WITH DIFFERENTIAL/PLATELET - Abnormal; Notable for the following:    WBC 16.0 (*)    MCV 102.2 (*)    Neutro Abs 14.0 (*)    Lymphs Abs 0.5 (*)    Monocytes Absolute 1.5 (*)    All other components within normal limits  COMPREHENSIVE METABOLIC PANEL - Abnormal; Notable for the following:    Chloride 100 (*)    CO2 19 (*)    Glucose, Bld 176 (*)    BUN 29 (*)    Creatinine, Ser 1.54 (*)    GFR calc non Af Amer 31 (*)    GFR calc Af Amer 36 (*)    Anion gap 17 (*)    All other components within normal limits  URINE MICROSCOPIC-ADD ON - Abnormal; Notable for the following:    Squamous Epithelial / LPF 0-5 (*)    Bacteria, UA FEW (*)    Casts HYALINE CASTS (*)    All other components within normal limits  I-STAT CG4 LACTIC ACID, ED - Abnormal; Notable for the following:     Lactic Acid, Venous 4.40 (*)    All other components within normal limits  URINE CULTURE  CULTURE, BLOOD (ROUTINE X 2)  CULTURE, BLOOD (ROUTINE X 2)  RESPIRATORY VIRUS PANEL  LIPASE, BLOOD  CBC WITH DIFFERENTIAL/PLATELET  INFLUENZA PANEL BY PCR (TYPE A & B, H1N1)  LACTIC ACID, PLASMA    Imaging Review Ct Head Wo Contrast  08/13/2015  CLINICAL DATA:  Found down and unresponsive. Bilateral leg weakness with sluggish pupil response. EXAM: CT HEAD WITHOUT CONTRAST TECHNIQUE: Contiguous axial images were obtained from the base of the skull through the vertex without intravenous contrast. COMPARISON:  Head CT 05/04/2015 and 04/08/2014. FINDINGS: There is no evidence of acute intracranial hemorrhage, mass lesion, brain edema or extra-axial fluid collection. The ventricles and subarachnoid spaces are prominent but stable. There is no CT evidence of acute cortical infarction. There are stable chronic small vessel ischemic changes in the periventricular white matter and basal ganglia bilaterally. There is a small mucous retention cyst inferiorly in the right maxillary sinus. There is chronic partial left mastoid air cell opacification. The visualized paranasal sinuses, mastoid air cells and middle ears are otherwise clear. The calvarium is intact. IMPRESSION: Stable head CT demonstrating atrophy and chronic small vessel ischemic changes. No generalized cerebral edema or acute focal abnormality identified. Electronically Signed   By: Richardean Sale M.D.   On: 08/13/2015 11:58   Dg Chest Port 1 View  08/13/2015  CLINICAL DATA:  Altered mental status, tachycardia EXAM: PORTABLE CHEST 1 VIEW COMPARISON:  04/16/2014 FINDINGS: Cardiomediastinal silhouette is stable. Study is limited by poor inspiration. Stable bilateral basilar atelectasis or scarring. No segmental infiltrate or pulmonary edema. IMPRESSION: No infiltrate or pulmonary edema. Limited study by poor inspiration. Stable bilateral basilar atelectasis or  scarring. Electronically Signed   By: Lahoma Crocker M.D.   On: 08/13/2015 11:08   I have personally reviewed and evaluated these lab results as part of my medical decision-making.   EKG Interpretation   Date/Time:  Tuesday August 13 2015 10:42:02 EST Ventricular Rate:  107 PR Interval:  155 QRS Duration: 95 QT Interval:  341 QTC Calculation: 455 R Axis:   11 Text Interpretation:  Sinus tachycardia Ventricular premature complex Low  voltage, precordial leads RSR' in V1 or V2, probably normal variant  Baseline wander in lead(s) I II aVR TECHNICALLY DIFFICULT No significant  change since last tracing Confirmed by Izzabelle Bouley MD, Derrika Ruffalo [phone]) on  08/13/2015 10:49:33 AM     Medications  sodium chloride 0.9 % bolus 1,000 mL (1,000 mLs Intravenous New Bag/Given 08/13/15 1300)    Followed by  sodium chloride 0.9 % bolus 500 mL (not administered)  vancomycin (VANCOCIN) IVPB 1000 mg/200 mL premix (not administered)  0.9 %  sodium chloride infusion (not administered)  piperacillin-tazobactam (ZOSYN) IVPB 3.375 g (3.375 g Intravenous New Bag/Given 08/13/15 1213)  vancomycin (VANCOCIN) IVPB 1000 mg/200 mL premix (1,000 mg Intravenous New Bag/Given 08/13/15 1215)    MDM   Final diagnoses:  Sepsis, due to unspecified organism [facility])  UTI (lower urinary tract infection)    Pt presents from home with altered mental status, last seen normal at 9 PM last night. At arrival, patient was somnolent but in no acute distress. Initial vital signs notable for heart rate 108, normal blood pressure. No focal abdominal tenderness and no focal neurologic deficits with the exception of the patient's altered mentation. Obtained above lab work as well as chest x-ray which was negative for acute process. Head CT negative acute. Later, the patient became more hypotensive at 97/47 and was noted to be febrile to 102. Initiated a code sepsis with broad-spectrum antibiotics and fluids as well as blood and urine cultures.lactate  elevated at 4.4, creatinine 1.54, UA consistent with infection. I suspect that UTI is the cause of the patient's sepsis given normal chest x-ray.  On reexamination, the patient's vitals are stable and her blood pressure has improved. Repeat lactate has been ordered. I discussed admission with nurse practitioner Ebony Hail w/ Dr. Marily Memos. He should admitted to stepdown for further care.  Sharlett Iles, MD 08/13/15 [phone]

## 2015-08-13 NOTE — ED Notes (Signed)
EDP aware hypotension.

## 2015-08-13 NOTE — ED Notes (Signed)
Attempted report 

## 2015-08-14 ENCOUNTER — Inpatient Hospital Stay (HOSPITAL_COMMUNITY): Payer: Medicare Other

## 2015-08-14 ENCOUNTER — Encounter (HOSPITAL_COMMUNITY): Payer: Self-pay | Admitting: Radiology

## 2015-08-14 DIAGNOSIS — I10 Essential (primary) hypertension: Secondary | ICD-10-CM

## 2015-08-14 DIAGNOSIS — R140 Abdominal distension (gaseous): Secondary | ICD-10-CM

## 2015-08-14 DIAGNOSIS — Z7189 Other specified counseling: Secondary | ICD-10-CM

## 2015-08-14 DIAGNOSIS — Z515 Encounter for palliative care: Secondary | ICD-10-CM

## 2015-08-14 DIAGNOSIS — R0600 Dyspnea, unspecified: Secondary | ICD-10-CM

## 2015-08-14 LAB — CBC
HCT: 33 % — ABNORMAL LOW (ref 36.0–46.0)
HEMOGLOBIN: 11 g/dL — AB (ref 12.0–15.0)
MCH: 33.1 pg (ref 26.0–34.0)
MCHC: 33.3 g/dL (ref 30.0–36.0)
MCV: 99.4 fL (ref 78.0–100.0)
Platelets: 187 10*3/uL (ref 150–400)
RBC: 3.32 MIL/uL — ABNORMAL LOW (ref 3.87–5.11)
RDW: 13.2 % (ref 11.5–15.5)
WBC: 16.7 10*3/uL — ABNORMAL HIGH (ref 4.0–10.5)

## 2015-08-14 LAB — GLUCOSE, CAPILLARY
GLUCOSE-CAPILLARY: 135 mg/dL — AB (ref 65–99)
GLUCOSE-CAPILLARY: 136 mg/dL — AB (ref 65–99)
GLUCOSE-CAPILLARY: 114 mg/dL — AB (ref 65–99)
GLUCOSE-CAPILLARY: 104 mg/dL — AB (ref 65–99)
GLUCOSE-CAPILLARY: 127 mg/dL — AB (ref 65–99)
Glucose-Capillary: 134 mg/dL — ABNORMAL HIGH (ref 65–99)
Glucose-Capillary: 148 mg/dL — ABNORMAL HIGH (ref 65–99)

## 2015-08-14 LAB — BASIC METABOLIC PANEL
ANION GAP: 11 (ref 5–15)
BUN: 33 mg/dL — ABNORMAL HIGH (ref 6–20)
CALCIUM: 7.6 mg/dL — AB (ref 8.9–10.3)
CO2: 19 mmol/L — ABNORMAL LOW (ref 22–32)
Chloride: 108 mmol/L (ref 101–111)
Creatinine, Ser: 1.25 mg/dL — ABNORMAL HIGH (ref 0.44–1.00)
GFR, EST AFRICAN AMERICAN: 47 mL/min — AB (ref >60)
GFR, EST NON AFRICAN AMERICAN: 40 mL/min — AB (ref >60)
GLUCOSE: 149 mg/dL — AB (ref 65–99)
Potassium: 4.5 mmol/L (ref 3.5–5.1)
Sodium: 138 mmol/L (ref 135–145)

## 2015-08-14 LAB — LACTIC ACID, PLASMA
LACTIC ACID, VENOUS: 2.1 mmol/L — AB (ref 0.5–2.0)
Lactic Acid, Venous: 1.9 mmol/L (ref 0.5–2.0)

## 2015-08-14 LAB — URINE CULTURE
Culture: NO GROWTH
SPECIAL REQUESTS: NORMAL

## 2015-08-14 MED ORDER — IBUPROFEN 200 MG PO TABS
400.0000 mg | ORAL_TABLET | Freq: Once | ORAL | Status: AC
  Administered 2015-08-14: 400 mg via ORAL
  Filled 2015-08-14: qty 2

## 2015-08-14 MED ORDER — ENOXAPARIN SODIUM 60 MG/0.6ML ~~LOC~~ SOLN
50.0000 mg | SUBCUTANEOUS | Status: DC
  Administered 2015-08-14 – 2015-08-19 (×6): 50 mg via SUBCUTANEOUS
  Filled 2015-08-14 (×7): qty 0.6

## 2015-08-14 MED ORDER — IOHEXOL 300 MG/ML  SOLN: 25.0000 mL | Freq: Once | INTRAMUSCULAR | Status: DC | PRN

## 2015-08-14 MED ORDER — PIPERACILLIN-TAZOBACTAM 3.375 G IVPB
3.3750 g | Freq: Three times a day (TID) | INTRAVENOUS | Status: DC
  Administered 2015-08-14 – 2015-08-17 (×10): 3.375 g via INTRAVENOUS
  Filled 2015-08-14 (×17): qty 50

## 2015-08-14 MED ORDER — POLYETHYLENE GLYCOL 3350 17 G PO PACK
17.0000 g | PACK | Freq: Every day | ORAL | Status: DC
  Administered 2015-08-14: 17 g via ORAL
  Filled 2015-08-14: qty 1

## 2015-08-14 MED ORDER — IOHEXOL 300 MG/ML  SOLN
75.0000 mL | Freq: Once | INTRAMUSCULAR | Status: AC | PRN
  Administered 2015-08-14: 75 mL via INTRAVENOUS

## 2015-08-14 MED ORDER — VANCOMYCIN HCL 10 G IV SOLR
1250.0000 mg | INTRAVENOUS | Status: DC
  Administered 2015-08-14 – 2015-08-15 (×2): 1250 mg via INTRAVENOUS
  Filled 2015-08-14 (×3): qty 1250

## 2015-08-14 MED ORDER — DOCUSATE SODIUM 100 MG PO CAPS
100.0000 mg | ORAL_CAPSULE | Freq: Two times a day (BID) | ORAL | Status: DC
  Administered 2015-08-14 – 2015-08-18 (×6): 100 mg via ORAL
  Filled 2015-08-14 (×6): qty 1

## 2015-08-14 MED ORDER — SODIUM CHLORIDE 0.9 % IV BOLUS (SEPSIS)
1000.0000 mL | INTRAVENOUS | Status: DC | PRN
  Administered 2015-08-22: 1000 mL via INTRAVENOUS
  Filled 2015-08-14: qty 1000

## 2015-08-14 MED ORDER — ENOXAPARIN SODIUM 60 MG/0.6ML ~~LOC~~ SOLN
50.0000 mg | SUBCUTANEOUS | Status: DC
  Filled 2015-08-14: qty 0.6

## 2015-08-14 MED ORDER — DEXTROSE-NACL 5-0.45 % IV SOLN
INTRAVENOUS | Status: DC
  Administered 2015-08-14: 75 mL/h via INTRAVENOUS
  Administered 2015-08-15: 06:00:00 via INTRAVENOUS

## 2015-08-14 MED ORDER — ENOXAPARIN SODIUM 40 MG/0.4ML ~~LOC~~ SOLN: 40.0000 mg | SUBCUTANEOUS | Status: DC

## 2015-08-14 NOTE — Progress Notes (Signed)
Dr Sheran Fava here examined pt orders received - She spoke to Pt's family about her care and treatment

## 2015-08-14 NOTE — Progress Notes (Signed)
[facility] PROGRESS NOTE  Carol Page [id number] DOB: Dec 01, 1935 DOA: 08/13/2015 PCP: Webb Silversmith, NP  Brief Summary  This is a 80 year old female patient with history of hypertension, seizure disorder, prediabetes, chronic urinary incontinence, dyslipidemia, severe obesity, prior CVA, anxiety and depression and history of chronic leg pain who was sent to the ER by her family due to altered mentation. At time of my assessment patient no family was at the bedside. Per ED provider and documentation in the EMR EMS reported that the patient's daughter stated that the patient was last seen normal at 9 PM last night on 1/23 and when the family came by to check her this morning she was less responsive and confused. It was documented she had been drowsy with "UTI type symptoms" based on family report. CBG was 154 at the scene. Because of significant altered mentation patient was to contribute to history portion of exam. Of note patient arrived with a completed DO NOT RESUSCITATE form in place.  In the ER, she was febrile to 102F and tachycardic.  CXR demonstrated no infiltrate and UA had only 0-5 WBC.  CT head was without acute abnormality.  She was started on broad spectrum antibiotics in the ER and narrowed to ceftriaxone after UA resulted.    Assessment/Plan  Sepsis due to source not yet identified.  Initially, it was thought that she might have a urinary tract infection, however her urinalysis demonstrated only 0-5 white blood cells. She continued to spike high fevers on ceftriaxone overnight and remained tachycardic with borderline low blood pressures.  Her family is concerned about increasing abdominal distention and pain. -  Start vancomycin and Zosyn -  Repeat chest x-ray: Bilateral atelectasis versus infiltrate -  CT scan of abdomen and pelvis -  Continue daily CBC -  Lactic acid has trended down and she is making urine -  Decrease IV fluids - respiratory viral panel pending - PICC  line placed urgently on 0000000  AKI / Metabolic acidemia, resolving with IVF -Continue to hold thiazide diuretic and ACE inhibitor -Hydrate as above - Repeat BMP in a.m.   Essential hypertension, with borderline hypertension -  Continue to hold antihypertensives   Seizure disorder, phenytoin level mildly elevated, which may be secondary to dehydration - Repeat Dilantin level tomorrow morning -Continue Dilantin at current dose   Prediabetes, but currently nothing by mouth -  Add dextrose IV fluids -Continue CBG every 4 hours -SSI   Urinary incontinence - DC Foley catheter   HLD  -Preadmission statin on hold   Severe obesity (BMI >= 40)    History of CVA  -Preadmission Plavix and statin on hold  Diet:  NPO awaiting speech therapy Access:  PIV IVF:  yes Proph:  lovenox  Code Status: DNR Family Communication: patient, her daughter and her son-in-law Disposition Plan: pending improvement in fevers   Consultants:  none  Procedures:  CT head  CT abd/pelvis  Antibiotics:  Vancomycin and zosyn 1/25 >   HPI/Subjective:  She continues to feel weak.  Only says "yes" and "no" but normally is conversant according to family.  Having severe abdominal pain worse than baseline and increased abdominal distension.  Last large BM 2 days ago and had smaller one yesterday, normal consistency.     Objective: Filed Vitals:   08/14/15 0754 08/14/15 1000 08/14/15 1220 08/14/15 1355  BP: 110/60 118/62 111/93   Pulse:   118   Temp: 101.3 F (38.5 C)  97.7 F (36.5 C) 101.9 F (38.8  C)  TempSrc: Axillary  Axillary Axillary  Resp:   13   Height:      Weight:      SpO2: 100%  94% 99%    Intake/Output Summary (Last 24 hours) at 08/14/15 1445 Last data filed at 08/14/15 1400  Gross per 24 hour  Intake 3507.5 ml  Output   2125 ml  Net 1382.5 ml   Filed Weights   08/13/15 1550 08/14/15 0424  Weight: 108.6 kg (239 lb 6.7 oz) 108.3 kg (238 lb 12.1 oz)   Body mass  index is 42.3 kg/(m^2).  Exam:   General:  No acute distress, adult female  HEENT:  NCAT, MMM, no nuchal rigidity  Cardiovascular:  RRR, nl S1, S2 no mrg, 2+ pulses, warm extremities  Respiratory:  Wheezing slightly bilaterally, no increased WOB  Abdomen:   Hypoactive BS, moderately distended, and TTP diffusely, worse around umbilicus with some guarding, no rebound.    MSK:   Normal tone and bulk, 1+ edema of the left ankle and no right ankle edema (chronic)  Neuro:  Diffusely weak, slightly more pronounced on right side  Data Reviewed: Basic Metabolic Panel:  Recent Labs Lab 08/13/15 1100 08/14/15 1100  NA 136 138  K 4.6 4.5  CL 100* 108  CO2 19* 19*  GLUCOSE 176* 149*  BUN 29* 33*  CREATININE 1.54* 1.25*  CALCIUM 9.2 7.6*   Liver Function Tests:  Recent Labs Lab 08/13/15 1100  AST 34  ALT 34  ALKPHOS 93  BILITOT 0.8  PROT 6.6  ALBUMIN 3.6    Recent Labs Lab 08/13/15 1100  LIPASE 29   No results for input(s): AMMONIA in the last 168 hours. CBC:  Recent Labs Lab 08/13/15 1100 08/14/15 1100  WBC 16.0* 16.7*  NEUTROABS 14.0*  --   HGB 13.7 11.0*  HCT 41.6 33.0*  MCV 102.2* 99.4  PLT 217 187    Recent Results (from the past 240 hour(s))  Urine culture     Status: None   Collection Time: 08/13/15 11:02 AM  Result Value Ref Range Status   Specimen Description URINE, CATHETERIZED  Final   Special Requests Normal  Final   Culture NO GROWTH 1 DAY  Final   Report Status 08/14/2015 FINAL  Final  Blood culture (routine x 2)     Status: None (Preliminary result)   Collection Time: 08/13/15  1:57 PM  Result Value Ref Range Status   Specimen Description BLOOD RIGHT ANTECUBITAL  Final   Special Requests   Final    BOTTLES DRAWN AEROBIC AND ANAEROBIC 5CC DRAWN AFTER ABX   Culture NO GROWTH < 24 HOURS  Final   Report Status PENDING  Incomplete  MRSA PCR Screening     Status: None   Collection Time: 08/13/15  4:59 PM  Result Value Ref Range Status    MRSA by PCR NEGATIVE NEGATIVE Final    Comment:        The GeneXpert MRSA Assay (FDA approved for NASAL specimens only), is one component of a comprehensive MRSA colonization surveillance program. It is not intended to diagnose MRSA infection nor to guide or monitor treatment for MRSA infections.   Blood culture (routine x 2)     Status: None (Preliminary result)   Collection Time: 08/13/15  7:20 PM  Result Value Ref Range Status   Specimen Description BLOOD LEFT HAND  Final   Special Requests IN PEDIATRIC BOTTLE 2CC  Final   Culture NO GROWTH < 24 HOURS  Final   Report Status PENDING  Incomplete     Studies: Ct Head Wo Contrast  08/13/2015  CLINICAL DATA:  Found down and unresponsive. Bilateral leg weakness with sluggish pupil response. EXAM: CT HEAD WITHOUT CONTRAST TECHNIQUE: Contiguous axial images were obtained from the base of the skull through the vertex without intravenous contrast. COMPARISON:  Head CT 05/04/2015 and 04/08/2014. FINDINGS: There is no evidence of acute intracranial hemorrhage, mass lesion, brain edema or extra-axial fluid collection. The ventricles and subarachnoid spaces are prominent but stable. There is no CT evidence of acute cortical infarction. There are stable chronic small vessel ischemic changes in the periventricular white matter and basal ganglia bilaterally. There is a small mucous retention cyst inferiorly in the right maxillary sinus. There is chronic partial left mastoid air cell opacification. The visualized paranasal sinuses, mastoid air cells and middle ears are otherwise clear. The calvarium is intact. IMPRESSION: Stable head CT demonstrating atrophy and chronic small vessel ischemic changes. No generalized cerebral edema or acute focal abnormality identified. Electronically Signed   By: Richardean Sale M.D.   On: 08/13/2015 11:58   Dg Chest Port 1 View  08/14/2015  CLINICAL DATA:  Dyspnea.  Wheezing.  Fever. EXAM: PORTABLE CHEST 1 VIEW  COMPARISON:  08/13/2015. FINDINGS: Grossly stable enlarged cardiac silhouette. A poor inspiration is again demonstrated. Mildly increased bibasilar atelectasis. The interstitial markings remain diffusely prominent. Right PICC tip at the superior cavoatrial junction. Mild scoliosis. IMPRESSION: 1. Increased bibasilar atelectasis. 2. Stable mild cardiomegaly and mild chronic interstitial lung disease. Electronically Signed   By: Claudie Revering M.D.   On: 08/14/2015 11:25   Dg Chest Port 1 View  08/13/2015  CLINICAL DATA:  Altered mental status, tachycardia EXAM: PORTABLE CHEST 1 VIEW COMPARISON:  04/16/2014 FINDINGS: Cardiomediastinal silhouette is stable. Study is limited by poor inspiration. Stable bilateral basilar atelectasis or scarring. No segmental infiltrate or pulmonary edema. IMPRESSION: No infiltrate or pulmonary edema. Limited study by poor inspiration. Stable bilateral basilar atelectasis or scarring. Electronically Signed   By: Lahoma Crocker M.D.   On: 08/13/2015 11:08    Scheduled Meds: . docusate sodium  100 mg Oral BID  . enoxaparin (LOVENOX) injection  50 mg Subcutaneous Q24H  . insulin aspart  0-9 Units Subcutaneous 6 times per day  . phenytoin (DILANTIN) IV  100 mg Intravenous 3 times per day  . piperacillin-tazobactam (ZOSYN)  IV  3.375 g Intravenous Q8H  . sodium chloride flush  10-40 mL Intracatheter Q12H  . sodium chloride flush  3 mL Intravenous Q12H  . vancomycin  1,250 mg Intravenous Q24H   Continuous Infusions: . dextrose 5 % and 0.45% NaCl 75 mL/hr (08/14/15 1052)    Principal Problem:   Sepsis due to urinary tract infection ([place]) Active Problems:   Essential hypertension   HLD (hyperlipidemia)   Severe obesity (BMI >= 40) ([facility])   Seizure disorder ([facility])   Urinary incontinence   History of CVA (cerebrovascular accident)   AKI (acute kidney injury) ([place])   Metabolic acidemia   Sepsis ([place])    Time spent: 30 min    Emelee Rodocker, [facility] Pager  [phone]. If 7PM-7AM, please contact night-coverage at [email or web address], password [facility] 08/14/2015, 2:45 PM  LOS: 1 day

## 2015-08-14 NOTE — Progress Notes (Addendum)
Pt much more alert than when first assessed this am. Able to have head of bed up & take PO crushed meds in applesauce. More verbal now. Moves all extremities. Oriented to self. Pt will not stay still with Auto BP took manual BP's today on Lt arm

## 2015-08-14 NOTE — Progress Notes (Signed)
Text page to Dr Sheran Fava to make aware of continued elevated temp. Pt continues to have 3 ice bags - 1 under her neck and 1 under each axilla Orders received

## 2015-08-14 NOTE — Consult Note (Signed)
Consultation Note Date: 08/14/2015   Patient Name: Carol Page  DOB: 09-14-1935  MRN: [id number]  Age / Sex: 80 y.o., female  PCP: Jearld Fenton, NP Referring Physician: Janece Canterbury, MD  Reason for Consultation: Establishing goals of care    Clinical Assessment/Narrative:   This is a 80 year old female patient with history of hypertension, seizure disorder, prediabetes, chronic urinary incontinence, dyslipidemia, severe obesity, prior CVA, anxiety and depression and history of chronic leg pain who was sent to the ER by her family due to altered mentation. She was admitted to the hospitalist service with a working diagnosis of UTI. Of note patient arrived with a completed DO NOT RESUSCITATE form in place.  In the ER, she was febrile to 102F and tachycardic.  CXR demonstrated no infiltrate and UA had only 0-5 WBC.  CT head was without acute abnormality.  She was started on broad spectrum antibiotics in the ER and narrowed to ceftriaxone after UA resulted.    Since admission, the patient has continued to spike high fevers on ceftriaxone overnight and remained tachycardic with borderline low blood pressures. She was started on Vancomycin and Zosyn, a CT abdomen pelvis has also been ordered. Other underlying conditions include AKI, HTN, Seizure disorder and prediabetes.  Palliative care consulted for goals of care discussions.   The patient is having fevers and chills, she does not verbalize at all. The patient's daughter [name] is at the bedside. The patient is noted to have ongoing falls at home, weakness, pain in LE. Daughter states she was advised back surgery several years ago but patient declined.   Patient is mostly bed bound at home. Daughter is her primary caregiver. Discussed about the patient's current condition. Daughter would like to continue antibiotics and current scope of medical treatment for now.    Palliative will follow disease trajectory and help guide appropriate medical decision making.     Contacts/Participants in Discussion: Primary Decision Maker:     Relationship to Patient   HCPOA: yes   daughter [name] with whom the patient lives is her primary caregiver. The patient has several children who live in the [place]: Continue current mode of care, patient's daughter states she wishes for antibiotics and current measures to continue.  Palliative will follow along.  Will engage in further discussions with the patient's daughter based on the patient's disease trajectory.  Thank you for the consult.    Code Status/Advance Care Planning: DNR    Code Status Orders        Start     Ordered   08/13/15 1615  Do not attempt resuscitation (DNR)   Continuous    Question Answer Comment  In the event of cardiac or respiratory ARREST Do not call a "code blue"   In the event of cardiac or respiratory ARREST Do not perform Intubation, CPR, defibrillation or ACLS   In the event of cardiac or respiratory ARREST Use medication by any route, position, wound care, and other measures to relive pain and suffering. May use oxygen, suction and manual treatment of airway obstruction as needed for comfort.      08/13/15 1614    Code Status History    Date Active Date Inactive Code Status Order ID Comments User Context   04/16/2014  3:47 PM 04/17/2014  5:20 PM Full Code [id number]  Debbe Odea, MD ED   05/22/2013  8:11 PM 05/24/2013  7:04 PM Full Code [id number]  Kinnie Feil, MD Inpatient  Advance Directive Documentation        Most Recent Value   Type of Advance Directive  Out of facility DNR (pink MOST or yellow form)   Pre-existing out of facility DNR order (yellow form or pink MOST form)  -- [at bedside]   "MOST" Form in Place?        Other Directives:Other  Symptom Management:   As above  Palliative Prophylaxis:   Delirium  Protocol  Additional Recommendations (Limitations, Scope, Preferences):  Follow disease trajectory     Psycho-social/Spiritual:  Support System: [place] Desire for further Chaplaincy support:no Additional Recommendations: Caregiving  Support/Resources  Prognosis: appears guarded given acute illness, underlying baseline chronic conditions, bed bound condition at baseline.   Discharge Planning: to be determined.   Chief Complaint/ Primary Diagnoses: Present on Admission:  . Sepsis due to urinary tract infection ([place]) . Essential hypertension . HLD (hyperlipidemia) . Severe obesity (BMI >= 40) ([facility]) . Urinary incontinence . AKI (acute kidney injury) ([place]) . Metabolic acidemia . Sepsis ([place])  I have reviewed the medical record, interviewed the patient and family, and examined the patient. The following aspects are pertinent.  Past Medical History  Diagnosis Date  . Stroke ([place])   . Hypertension   . Hyperlipidemia   . Arthritis   . Depression   . Ulcer   . History of blood transfusion   . Urinary incontinence   . Allergy   . Skin cancer of nose   . Cellulitis of left leg   . Pre-diabetes    Social History   Social History  . Marital Status: Widowed    Spouse Name: N/A  . Number of Children: 4  . Years of Education: 12   Occupational History  . Retired    Social History Main Topics  . Smoking status: Never Smoker   . Smokeless tobacco: Never Used  . Alcohol Use: No  . Drug Use: No  . Sexual Activity: No   Other Topics Concern  . None   Social History Narrative   Regular exercise-no   Caffeine Use-yes   Family History  Problem Relation Age of Onset  . Heart disease Other     Parent  . Cancer Maternal Uncle    Scheduled Meds: . docusate sodium  100 mg Oral BID  . enoxaparin (LOVENOX) injection  50 mg Subcutaneous Q24H  . insulin aspart  0-9 Units Subcutaneous 6 times per day  . phenytoin (DILANTIN) IV  100 mg Intravenous 3 times per day  .  piperacillin-tazobactam (ZOSYN)  IV  3.375 g Intravenous Q8H  . sodium chloride flush  10-40 mL Intracatheter Q12H  . sodium chloride flush  3 mL Intravenous Q12H  . vancomycin  1,250 mg Intravenous Q24H   Continuous Infusions: . dextrose 5 % and 0.45% NaCl 75 mL/hr (08/14/15 1052)   PRN Meds:.acetaminophen **OR** acetaminophen, iohexol, iohexol, ondansetron **OR** ondansetron (ZOFRAN) IV, sodium chloride flush Medications Prior to Admission:  Prior to Admission medications   Medication Sig Start Date End Date Taking? Authorizing Provider  acetaminophen (TYLENOL) 500 MG tablet Take 1,000 mg by mouth every 8 (eight) hours.    Yes Historical Provider, MD  amitriptyline (ELAVIL) 25 MG tablet Take 1 tablet (25 mg total) by mouth at bedtime. 05/27/15  Yes Jearld Fenton, NP  atorvastatin (LIPITOR) 80 MG tablet TAKE ONE TABLET BY MOUTH ONCE DAILY 05/15/15  Yes Jearld Fenton, NP  CARTIA XT 180 MG 24 hr capsule TAKE ONE CAPSULE BY MOUTH ONCE DAILY  05/15/15  Yes Jearld Fenton, NP  clopidogrel (PLAVIX) 75 MG tablet TAKE ONE TABLET BY MOUTH IN THE EVENING 05/15/15  Yes Jearld Fenton, NP  gabapentin (NEURONTIN) 100 MG capsule Take 200-300 mg by mouth 3 (three) times daily. Pt takes 300mg  in am, 200mg  at 1400 and 200mg  at bedtime   Yes Historical Provider, MD  hydrochlorothiazide (MICROZIDE) 12.5 MG capsule TAKE ONE CAPSULE BY MOUTH ONCE DAILY 05/15/15  Yes Jearld Fenton, NP  lisinopril (PRINIVIL,ZESTRIL) 5 MG tablet Take 5 mg by mouth daily.   Yes Historical Provider, MD  LORazepam (ATIVAN) 0.5 MG tablet Take 0.5 mg by mouth daily.   Yes Historical Provider, MD  nitroGLYCERIN (NITROSTAT) 0.4 MG SL tablet Place 1 tablet (0.4 mg total) under the tongue every 5 (five) minutes as needed for chest pain. 10/05/13  Yes Jearld Fenton, NP  oxybutynin (DITROPAN) 5 MG tablet TAKE ONE TABLET BY MOUTH THREE TIMES DAILY 05/27/15  Yes Jearld Fenton, NP  phenytoin (DILANTIN) 100 MG ER capsule TAKE TWO CAPSULES BY  MOUTH TWICE DAILY 05/27/15  Yes Jearld Fenton, NP  traZODone (DESYREL) 50 MG tablet TAKE THREE TABLETS BY MOUTH AT BEDTIME 07/19/15  Yes Jearld Fenton, NP  vitamin B-12 (CYANOCOBALAMIN) 1000 MCG tablet Take 1,000 mcg by mouth daily.   Yes Historical Provider, MD  CARTIA XT 180 MG 24 hr capsule TAKE ONE CAPSULE BY MOUTH ONCE DAILY Patient not taking: Reported on 08/13/2015 05/27/15   Jearld Fenton, NP  escitalopram (LEXAPRO) 10 MG tablet TAKE ONE TABLET BY MOUTH ONCE DAILY Patient not taking: Reported on 08/13/2015 06/26/15   Jearld Fenton, NP  gabapentin (NEURONTIN) 300 MG capsule TAKE THREE CAPSULES BY MOUTH IN THE MORNING, TWO CAPSULES BY MOUTH IN THE AFTERNOON AND TWO CAPSULES BY MOUTH AT BEDTIME Patient not taking: Reported on 08/13/2015 06/26/15   Jearld Fenton, NP  lisinopril (PRINIVIL,ZESTRIL) 10 MG tablet TAKE ONE TABLET BY MOUTH ONCE DAILY Patient not taking: Reported on 08/13/2015 05/15/15   Jearld Fenton, NP  lisinopril (PRINIVIL,ZESTRIL) 10 MG tablet TAKE ONE TABLET BY MOUTH ONCE DAILY Patient not taking: Reported on 08/13/2015 05/27/15   Jearld Fenton, NP  LORazepam (ATIVAN) 1 MG tablet TAKE ONE TABLET BY MOUTH ONCE DAILY AS NEEDED Patient not taking: Reported on 08/13/2015 08/01/15   Jearld Fenton, NP  nitrofurantoin, macrocrystal-monohydrate, (MACROBID) 100 MG capsule Take 1 capsule (100 mg total) by mouth 2 (two) times daily. Patient not taking: Reported on 08/13/2015 05/04/15   Roberto Scales, MD  oxybutynin (DITROPAN) 5 MG tablet TAKE ONE TABLET BY MOUTH THREE TIMES DAILY Patient not taking: Reported on 08/13/2015 05/15/15   Jearld Fenton, NP  phenytoin (DILANTIN) 100 MG ER capsule TAKE TWO CAPSULES BY MOUTH TWICE DAILY Patient not taking: Reported on 08/13/2015 05/15/15   Jearld Fenton, NP   Allergies  Allergen Reactions  . Codeine Nausea And Vomiting  . Ether Nausea And Vomiting    Review of Systems Does not verbalize  Physical Exam Elderly lady with fever, chills Does  not verbalize S1 S2 tachycardia Rapid shallow breathing Mild abdominal distension Trace edema Awake but not alert, does not verbalize  Vital Signs: BP 111/93 mmHg  Pulse 118  Temp(Src) 101.9 F (38.8 C) (Axillary)  Resp 13  Ht 5\' 3"  (1.6 m)  Wt 108.3 kg (238 lb 12.1 oz)  BMI 42.30 kg/m2  SpO2 99%  SpO2: SpO2: 99 % O2 Device:SpO2: 99 % O2 Flow Rate: .  O2 Flow Rate (L/min): 3 L/min  IO: Intake/output summary:  Intake/Output Summary (Last 24 hours) at 08/14/15 1456 Last data filed at 08/14/15 1400  Gross per 24 hour  Intake 3507.5 ml  Output   2125 ml  Net 1382.5 ml    LBM: Last BM Date: 08/12/15 Baseline Weight: Weight: 108.6 kg (239 lb 6.7 oz) Most recent weight: Weight: 108.3 kg (238 lb 12.1 oz)      Palliative Assessment/Data:  Flowsheet Rows        Most Recent Value   Intake Tab    Referral Department  Hospitalist   Unit at Time of Referral  ICU   Palliative Care Primary Diagnosis  Sepsis/Infectious Disease   Date Notified  08/14/15   Palliative Care Type  New Palliative care   Reason for referral  Clarify Goals of Care   Date first seen by Palliative Care  08/14/15   # of days Palliative referral response time  0 Day(s)   Clinical Assessment    Palliative Performance Scale Score  30%   Pain Max last 24 hours  5   Pain Min Last 24 hours  4   Dyspnea Max Last 24 Hours  4   Dyspnea Min Last 24 hours  3   Psychosocial & Spiritual Assessment    Palliative Care Outcomes    Patient/Family meeting held?  Yes   Who was at the meeting?  daughter patient son in law   [place] goals of care   Palliative Care follow-up planned  Yes, Facility      Additional Data Reviewed:  CBC:    Component Value Date/Time   WBC 16.7* 08/14/2015 1100   HGB 11.0* 08/14/2015 1100   HCT 33.0* 08/14/2015 1100   PLT 187 08/14/2015 1100   MCV 99.4 08/14/2015 1100   NEUTROABS 14.0* 08/13/2015 1100   LYMPHSABS 0.5* 08/13/2015 1100   MONOABS 1.5*  08/13/2015 1100   EOSABS 0.0 08/13/2015 1100   BASOSABS 0.0 08/13/2015 1100   Comprehensive Metabolic Panel:    Component Value Date/Time   NA 138 08/14/2015 1100   K 4.5 08/14/2015 1100   CL 108 08/14/2015 1100   CO2 19* 08/14/2015 1100   BUN 33* 08/14/2015 1100   CREATININE 1.25* 08/14/2015 1100   GLUCOSE 149* 08/14/2015 1100   CALCIUM 7.6* 08/14/2015 1100   AST 34 08/13/2015 1100   ALT 34 08/13/2015 1100   ALKPHOS 93 08/13/2015 1100   BILITOT 0.8 08/13/2015 1100   PROT 6.6 08/13/2015 1100   ALBUMIN 3.6 08/13/2015 1100     Time In: 11 Time Out: 12 Time Total: 60 min  Greater than 50%  of this time was spent counseling and coordinating care related to the above assessment and plan.  Signed by: Loistine Chance, MD [id number]  Loistine Chance, MD  08/14/2015, 2:56 PM  Please contact Palliative Medicine Team phone at [phone] for questions and concerns.

## 2015-08-14 NOTE — Progress Notes (Signed)
ANTIBIOTIC CONSULT NOTE - INITIAL  Pharmacy Consult for Vancomycin, Zosyn  Indication: Sepsis  Allergies  Allergen Reactions  . Codeine Nausea And Vomiting  . Ether Nausea And Vomiting    Patient Measurements: Height: 5\' 3"  (160 cm) Weight: 238 lb 12.1 oz (108.3 kg) IBW/kg (Calculated) : 52.4  Vital Signs: Temp: 101.3 F (38.5 C) (01/25 0754) Temp Source: Axillary (01/25 0754) BP: 118/62 mmHg (01/25 1000) Pulse Rate: 81 (01/25 0530) Intake/Output from previous day: 01/24 0701 - 01/25 0700 In: 3057.5 [I.V.:2007.5; IV Piggyback:1050] Out: 1475 [Urine:1475] Intake/Output from this shift: Total I/O In: 150 [I.V.:150] Out: 350 [Urine:350]  Labs:  Recent Labs  08/13/15 1100  WBC 16.0*  HGB 13.7  PLT 217  CREATININE 1.54*   Estimated Creatinine Clearance: 36.1 mL/min (by C-G formula based on Cr of 1.54). No results for input(s): VANCOTROUGH, VANCOPEAK, VANCORANDOM, GENTTROUGH, GENTPEAK, GENTRANDOM, TOBRATROUGH, TOBRAPEAK, TOBRARND, AMIKACINPEAK, AMIKACINTROU, AMIKACIN in the last 72 hours.   Microbiology: Recent Results (from the past 720 hour(s))  Urine culture     Status: None   Collection Time: 08/13/15 11:02 AM  Result Value Ref Range Status   Specimen Description URINE, CATHETERIZED  Final   Special Requests Normal  Final   Culture NO GROWTH 1 DAY  Final   Report Status 08/14/2015 FINAL  Final  MRSA PCR Screening     Status: None   Collection Time: 08/13/15  4:59 PM  Result Value Ref Range Status   MRSA by PCR NEGATIVE NEGATIVE Final    Comment:        The GeneXpert MRSA Assay (FDA approved for NASAL specimens only), is one component of a comprehensive MRSA colonization surveillance program. It is not intended to diagnose MRSA infection nor to guide or monitor treatment for MRSA infections.     Medical History: Past Medical History  Diagnosis Date  . Stroke ([place])   . Hypertension   . Hyperlipidemia   . Arthritis   . Depression   . Ulcer   .  History of blood transfusion   . Urinary incontinence   . Allergy   . Skin cancer of nose   . Cellulitis of left leg   . Pre-diabetes     Medications:  Prescriptions prior to admission  Medication Sig Dispense Refill Last Dose  . acetaminophen (TYLENOL) 500 MG tablet Take 1,000 mg by mouth every 8 (eight) hours.    08/12/2015 at Unknown time  . amitriptyline (ELAVIL) 25 MG tablet Take 1 tablet (25 mg total) by mouth at bedtime. 30 tablet 5 08/12/2015 at Unknown time  . atorvastatin (LIPITOR) 80 MG tablet TAKE ONE TABLET BY MOUTH ONCE DAILY 30 tablet 5 08/12/2015 at Unknown time  . CARTIA XT 180 MG 24 hr capsule TAKE ONE CAPSULE BY MOUTH ONCE DAILY 30 capsule 5 08/12/2015 at Unknown time  . clopidogrel (PLAVIX) 75 MG tablet TAKE ONE TABLET BY MOUTH IN THE EVENING 30 tablet 5 08/12/2015 at Unknown time  . gabapentin (NEURONTIN) 100 MG capsule Take 200-300 mg by mouth 3 (three) times daily. Pt takes 300mg  in am, 200mg  at 1400 and 200mg  at bedtime   08/12/2015 at Unknown time  . hydrochlorothiazide (MICROZIDE) 12.5 MG capsule TAKE ONE CAPSULE BY MOUTH ONCE DAILY 30 capsule 5 08/12/2015 at Unknown time  . lisinopril (PRINIVIL,ZESTRIL) 5 MG tablet Take 5 mg by mouth daily.   08/12/2015 at Unknown time  . LORazepam (ATIVAN) 0.5 MG tablet Take 0.5 mg by mouth daily.   08/12/2015 at Unknown time  .  nitroGLYCERIN (NITROSTAT) 0.4 MG SL tablet Place 1 tablet (0.4 mg total) under the tongue every 5 (five) minutes as needed for chest pain. 30 tablet 0 unk at unk  . oxybutynin (DITROPAN) 5 MG tablet TAKE ONE TABLET BY MOUTH THREE TIMES DAILY 90 tablet 5 08/12/2015 at Unknown time  . phenytoin (DILANTIN) 100 MG ER capsule TAKE TWO CAPSULES BY MOUTH TWICE DAILY 120 capsule 5 08/12/2015 at Unknown time  . traZODone (DESYREL) 50 MG tablet TAKE THREE TABLETS BY MOUTH AT BEDTIME 90 tablet 0 08/12/2015 at Unknown time  . vitamin B-12 (CYANOCOBALAMIN) 1000 MCG tablet Take 1,000 mcg by mouth daily.   08/12/2015 at Unknown time   . CARTIA XT 180 MG 24 hr capsule TAKE ONE CAPSULE BY MOUTH ONCE DAILY (Patient not taking: Reported on 08/13/2015) 30 capsule 5 Not Taking at Unknown time  . escitalopram (LEXAPRO) 10 MG tablet TAKE ONE TABLET BY MOUTH ONCE DAILY (Patient not taking: Reported on 08/13/2015) 30 tablet 3 Not Taking at Unknown time  . gabapentin (NEURONTIN) 300 MG capsule TAKE THREE CAPSULES BY MOUTH IN THE MORNING, TWO CAPSULES BY MOUTH IN THE AFTERNOON AND TWO CAPSULES BY MOUTH AT BEDTIME (Patient not taking: Reported on 08/13/2015) 210 capsule 3 Not Taking at Unknown time  . lisinopril (PRINIVIL,ZESTRIL) 10 MG tablet TAKE ONE TABLET BY MOUTH ONCE DAILY (Patient not taking: Reported on 08/13/2015) 30 tablet 5 Not Taking at Unknown time  . lisinopril (PRINIVIL,ZESTRIL) 10 MG tablet TAKE ONE TABLET BY MOUTH ONCE DAILY (Patient not taking: Reported on 08/13/2015) 30 tablet 5 Not Taking at Unknown time  . LORazepam (ATIVAN) 1 MG tablet TAKE ONE TABLET BY MOUTH ONCE DAILY AS NEEDED (Patient not taking: Reported on 08/13/2015) 30 tablet 0 Not Taking at Unknown time  . nitrofurantoin, macrocrystal-monohydrate, (MACROBID) 100 MG capsule Take 1 capsule (100 mg total) by mouth 2 (two) times daily. (Patient not taking: Reported on 08/13/2015) 9 capsule 0 Completed Course at Unknown time  . oxybutynin (DITROPAN) 5 MG tablet TAKE ONE TABLET BY MOUTH THREE TIMES DAILY (Patient not taking: Reported on 08/13/2015) 90 tablet 5 Not Taking at Unknown time  . phenytoin (DILANTIN) 100 MG ER capsule TAKE TWO CAPSULES BY MOUTH TWICE DAILY (Patient not taking: Reported on 08/13/2015) 120 capsule 5 Not Taking at Unknown time   Assessment: 34 YOF who presented with altered mentation. Patient is now febrile with elevated WBC. Pharmacy consulted to switch from ceftriaxone to IV Zosyn and Vancomycin for empiric coverage of sepsis. Patient received 2 gm IV Vancomycin and one dose of Zosyn at ~ 12 PM yesterday. nCrCl ~ 40 mL/min   1/24 vanc > 1/24;  1/25>> 1/24 zosyn > 1/24; 1/25>> 1/24 rocephin > 1/25  1/24 blood [id number] 1/24 urine [id number]   Goal of Therapy:  Vancomycin trough level 15-20 mcg/ml  Plan:  -Start Zosyn 3.375 gm IV Q 8 hours and Vancomycin 1250 mg IV Q 24 hours -Monitor CBC, renal fx, cultures and clinical progress -De-escalate as appropriate  -[place] at [place], PharmD., BCPS Clinical Pharmacist Pager [phone]

## 2015-08-14 NOTE — Progress Notes (Addendum)
Temp 101.3 ax text page to Dr short to make aware of temp next time able to give tylenol supp is 1020. Ice bags placed to axilla and behind neck. Pt having some chills. When shaking more when auto BP cuff inflates  so manual BP take actual BP Lt arm 110/60

## 2015-08-14 NOTE — Progress Notes (Signed)
Utilization Review Completed.Donne Anon T1/25/2017

## 2015-08-14 NOTE — Progress Notes (Signed)
Labs drawn from PICC line - Lab here unable to get Blood Culture will try later. Port CXR done. Family aware that CT of ABD will be done later today. Pt now moans and yells no whenever touched even for BP. BP still done manually due to whenever Auto goes off it causes her discomfort. Her sister is visiting says that Pt. Has zero pain tolerance and that's the way it's always been done.

## 2015-08-14 NOTE — Evaluation (Signed)
Clinical/Bedside Swallow Evaluation Patient Details  Name: Carol Page MRN: [id number] Date of Birth: [birth year]  Today's Date: 08/14/2015 Time: SLP Start Time (ACUTE ONLY): [id number] SLP Stop Time (ACUTE ONLY): 1549 SLP Time Calculation (min) (ACUTE ONLY): 18 min  Past Medical History:  Past Medical History  Diagnosis Date  . Stroke ([place])   . Hypertension   . Hyperlipidemia   . Arthritis   . Depression   . Ulcer   . History of blood transfusion   . Urinary incontinence   . Allergy   . Skin cancer of nose   . Cellulitis of left leg   . Pre-diabetes    Past Surgical History:  Past Surgical History  Procedure Laterality Date  . Tonsillectomy  1944  . Tubal ligation    . Bilateral oophorectomy  06/2012    ovarian cysts  . Skin cancer excision  2007    reomved from nose   HPI:  80 year old female patient with history of hypertension, seizure disorder, prediabetes, chronic urinary incontinence, dyslipidemia, severe obesity, prior CVA, anxiety and depression and history of chronic leg pain who was sent to the ER by her family due to altered mentation. CXR demonstrated no infiltrate and family denies difficulties with swallowing PTA.   Assessment / Plan / Recommendation Clinical Impression  Pt needs Mod cues intially for bolus acceptance, with automaticity increasing as trials continued. She appears to have good timing with thin liquids and purees. No overt coughing is observed, but she does have weak hyolaryngeal movemetn to palpation and her vocal quality cannot be assessed as she is not following commands to phonate at this time. Although swallow appears grossly functional, pt fatigues very quickly, and as a result her risk of aspiration would increase. Recommend to allow a few, small bites of puree and sips of water PRN for today. Meds could also be administered crushed in puree. SLP to f/u for advancement and diet initiation.    Aspiration Risk  Moderate aspiration risk    Diet  Recommendation NPO except meds;Other (Comment) (few, small bites of puree/sips of water PRN)   Liquid Administration via: Spoon;Straw Medication Administration: Crushed with puree Compensations: Slow rate;Small sips/bites;Minimize environmental distractions Postural Changes: Seated upright at 90 degrees    Other  Recommendations Oral Care Recommendations: Oral care QID   Follow up Recommendations   (tba)    Frequency and Duration min 2x/week  2 weeks       Prognosis Prognosis for Safe Diet Advancement: Fair      Swallow Study   General HPI: 81 year old female patient with history of hypertension, seizure disorder, prediabetes, chronic urinary incontinence, dyslipidemia, severe obesity, prior CVA, anxiety and depression and history of chronic leg pain who was sent to the ER by her family due to altered mentation. CXR demonstrated no infiltrate and family denies difficulties with swallowing PTA. Type of Study: Bedside Swallow Evaluation Previous Swallow Assessment: none in chart Diet Prior to this Study: NPO Temperature Spikes Noted: Yes (102.7) Respiratory Status: Nasal cannula History of Recent Intubation: No Behavior/Cognition: Alert;Requires cueing Oral Cavity Assessment: Within Functional Limits Oral Care Completed by SLP: No Oral Cavity - Dentition: Edentulous;Other (Comment) (dentures available but not placed) Self-Feeding Abilities: Total assist Patient Positioning: Upright in bed Baseline Vocal Quality: Not observed    Oral/Motor/Sensory Function Overall Oral Motor/Sensory Function:  (not following commands to assess)   Ice Chips Ice chips: Impaired Presentation: Spoon Oral Phase Impairments: Poor awareness of bolus Pharyngeal Phase Impairments: Decreased hyoid-laryngeal movement   Thin  Liquid Thin Liquid: Impaired Presentation: Spoon;Straw Pharyngeal  Phase Impairments: Decreased hyoid-laryngeal movement    Nectar Thick Nectar Thick Liquid: Not tested   Honey  Thick Honey Thick Liquid: Not tested   Puree Puree: Impaired Presentation: Spoon Pharyngeal Phase Impairments: Decreased hyoid-laryngeal movement   Solid   GO   Solid: Not tested        Germain Osgood, M.A. CCC-SLP [phone]  Germain Osgood 08/14/2015,4:23 PM

## 2015-08-15 LAB — PROCALCITONIN: Procalcitonin: 17.92 ng/mL

## 2015-08-15 LAB — BASIC METABOLIC PANEL
ANION GAP: 7 (ref 5–15)
BUN: 43 mg/dL — ABNORMAL HIGH (ref 6–20)
CHLORIDE: 110 mmol/L (ref 101–111)
CO2: 20 mmol/L — AB (ref 22–32)
Calcium: 7.3 mg/dL — ABNORMAL LOW (ref 8.9–10.3)
Creatinine, Ser: 1.72 mg/dL — ABNORMAL HIGH (ref 0.44–1.00)
GFR calc Af Amer: 32 mL/min — ABNORMAL LOW (ref >60)
GFR calc non Af Amer: 27 mL/min — ABNORMAL LOW (ref >60)
GLUCOSE: 131 mg/dL — AB (ref 65–99)
POTASSIUM: 3.7 mmol/L (ref 3.5–5.1)
Sodium: 137 mmol/L (ref 135–145)

## 2015-08-15 LAB — GLUCOSE, CAPILLARY
GLUCOSE-CAPILLARY: 120 mg/dL — AB (ref 65–99)
GLUCOSE-CAPILLARY: 124 mg/dL — AB (ref 65–99)
GLUCOSE-CAPILLARY: 142 mg/dL — AB (ref 65–99)
Glucose-Capillary: 131 mg/dL — ABNORMAL HIGH (ref 65–99)
Glucose-Capillary: 139 mg/dL — ABNORMAL HIGH (ref 65–99)
Glucose-Capillary: 128 mg/dL — ABNORMAL HIGH (ref 65–99)

## 2015-08-15 LAB — LACTIC ACID, PLASMA: LACTIC ACID, VENOUS: 1.4 mmol/L (ref 0.5–2.0)

## 2015-08-15 LAB — CBC
HEMATOCRIT: 27.4 % — AB (ref 36.0–46.0)
HEMOGLOBIN: 8.8 g/dL — AB (ref 12.0–15.0)
MCH: 32.5 pg (ref 26.0–34.0)
MCHC: 32.1 g/dL (ref 30.0–36.0)
MCV: 101.1 fL — ABNORMAL HIGH (ref 78.0–100.0)
Platelets: 150 10*3/uL (ref 150–400)
RBC: 2.71 MIL/uL — ABNORMAL LOW (ref 3.87–5.11)
RDW: 13.6 % (ref 11.5–15.5)
WBC: 14.2 10*3/uL — ABNORMAL HIGH (ref 4.0–10.5)

## 2015-08-15 LAB — PHENYTOIN LEVEL, TOTAL: Phenytoin Lvl: 15.9 ug/mL (ref 10.0–20.0)

## 2015-08-15 MED ORDER — SODIUM CHLORIDE 0.9 % IV BOLUS (SEPSIS)
500.0000 mL | Freq: Once | INTRAVENOUS | Status: AC
  Administered 2015-08-15: 500 mL via INTRAVENOUS

## 2015-08-15 MED ORDER — SENNOSIDES-DOCUSATE SODIUM 8.6-50 MG PO TABS: 1.0000 | ORAL_TABLET | Freq: Two times a day (BID) | ORAL | Status: DC

## 2015-08-15 MED ORDER — POLYETHYLENE GLYCOL 3350 17 G PO PACK: 17.0000 g | PACK | Freq: Every day | ORAL | Status: DC

## 2015-08-15 MED ORDER — SODIUM CHLORIDE 0.9 % IV SOLN
INTRAVENOUS | Status: DC
  Administered 2015-08-16 (×2): via INTRAVENOUS

## 2015-08-15 NOTE — Care Management Note (Signed)
Case Management Note  Patient Details  Name: Carol Page MRN: [id number] Date of Birth: 05-03-1936  Subjective/Objective:   Pt lives with dtr and son-in-law, has walker and wheelchair.  Per dtr, pt has been basically bed-to-chair transfers, has been ambulating very little, will probably need rehab before returning home.                          Expected Discharge Plan:  Skilled Nursing Facility  In-House Referral:  Clinical Social Work  Discharge planning Services  CM Consult  Status of Service:  In process, will continue to follow  Girard Cooter, RN 08/15/2015, 2:15 PM

## 2015-08-15 NOTE — Progress Notes (Signed)
Patient is constipated, manual disimpaction done. Moderate amount of hard-formed stools.

## 2015-08-15 NOTE — Progress Notes (Addendum)
[facility] PROGRESS NOTE  Carol Page [id number] DOB: 09-04-1935 DOA: 08/13/2015 PCP: Webb Silversmith, NP  Brief Summary  This is a 80 year old female patient with history of hypertension, seizure disorder, prediabetes, chronic urinary incontinence, dyslipidemia, severe obesity, prior CVA, anxiety and depression and history of chronic leg pain who was sent to the ER by her family due to altered mentation. At time of my assessment patient no family was at the bedside. Per ED provider and documentation in the EMR EMS reported that the patient's daughter stated that the patient was last seen normal at 9 PM last night on 1/23 and when the family came by to check her this morning she was less responsive and confused. It was documented she had been drowsy with "UTI type symptoms" based on family report. CBG was 154 at the scene. Because of significant altered mentation patient was to contribute to history portion of exam. Of note patient arrived with a completed DO NOT RESUSCITATE form in place.  In the ER, she was febrile to 102F and tachycardic.  CXR demonstrated no infiltrate and UA had only 0-5 WBC.  CT head was without acute abnormality.  She was started on broad spectrum antibiotics in the ER and narrowed to ceftriaxone after UA resulted.    Assessment/Plan  Sepsis due to colitis?  Initially, it was thought that she might have a urinary tract infection, however her urinalysis demonstrated only 0-5 white blood cells. She continued to spike high fevers on ceftriaxone overnight and remained tachycardic with borderline low blood pressures.    Cont  vancomycin and Zosyn, now day 2 , pending cx data  -  Repeat chest x-ray: Bilateral atelectasis versus infiltrate -  CT scan of abdomen and pelvis Large stool burden throughout the colon with wall thickening and stranding about the transverse and descending colon most consistent with colitis. -  Continue daily CBC -  Lactic acid has trended down and  she is making urine Cont  IV fluids - respiratory viral panel pending - PICC line placed urgently on 0000000  AKI / Metabolic acidemia, resolving with IVF -Continue to hold thiazide diuretic and ACE inhibitor -Hydrate as above Cr increased overnight 1.25>1.72   Essential hypertension, with borderline hypertension -  Continue to hold antihypertensives   Seizure disorder, phenytoin level mildly elevated, which may be secondary to dehydration - Repeat Dilantin level tomorrow morning -Continue Dilantin at current dose   Prediabetes, but currently nothing by mouth -Continue CBG every 4 hours,-SSI,   Urinary incontinence - DC Foley catheter   HLD  -Preadmission statin on hold   Severe obesity (BMI >= 40)    History of CVA  -Preadmission Plavix and statin on hold  Constipation Start the patient on MiraLAX    Diet:  NPO except meds;Other (Comment) (few, small bites of puree/sips of water PRN) per speech  Access:  PIV IVF:  yes Proph:  lovenox  Code Status: DNR Family Communication: patient, her daughter and her son-in-law Disposition Plan: pending improvement in fevers   Consultants:  none  Procedures:  CT head  CT abd/pelvis  Antibiotics:  Vancomycin and zosyn 1/25 >   HPI/Subjective:  much more alert than when first assessed this am. Able to have head of bed up & take PO crushed meds in applesauce, BP improved after bolus this am    Objective: Filed Vitals:   08/15/15 0441 08/15/15 0555 08/15/15 0807 08/15/15 1230  BP:   62/55 116/50  Pulse:   105 106  Temp:  99.3 F (37.4 C) 99.3 F (37.4 C) 100.2 F (37.9 C)  TempSrc:  Axillary Axillary Axillary  Resp:   18 20  Height:      Weight: 111.5 kg (245 lb 13 oz)     SpO2:   100% 97%    Intake/Output Summary (Last 24 hours) at 08/15/15 1349 Last data filed at 08/15/15 0957  Gross per 24 hour  Intake   3078 ml  Output    700 ml  Net   2378 ml   Filed Weights   08/13/15 1550 08/14/15 0424  08/15/15 0441  Weight: 108.6 kg (239 lb 6.7 oz) 108.3 kg (238 lb 12.1 oz) 111.5 kg (245 lb 13 oz)   Body mass index is 43.55 kg/(m^2).  Exam:   General:  No acute distress, adult female  HEENT:  NCAT, MMM, no nuchal rigidity  Cardiovascular:  RRR, nl S1, S2 no mrg, 2+ pulses, warm extremities  Respiratory:  Wheezing slightly bilaterally, no increased WOB  Abdomen:   Hypoactive BS, moderately distended, and TTP diffusely, worse around umbilicus with some guarding, no rebound.    MSK:   Normal tone and bulk, 1+ edema of the left ankle and no right ankle edema (chronic)  Neuro:  Diffusely weak, slightly more pronounced on right side  Data Reviewed: Basic Metabolic Panel:  Recent Labs Lab 08/13/15 1100 08/14/15 1100 08/15/15 0520  NA 136 138 137  K 4.6 4.5 3.7  CL 100* 108 110  CO2 19* 19* 20*  GLUCOSE 176* 149* 131*  BUN 29* 33* 43*  CREATININE 1.54* 1.25* 1.72*  CALCIUM 9.2 7.6* 7.3*   Liver Function Tests:  Recent Labs Lab 08/13/15 1100  AST 34  ALT 34  ALKPHOS 93  BILITOT 0.8  PROT 6.6  ALBUMIN 3.6    Recent Labs Lab 08/13/15 1100  LIPASE 29   No results for input(s): AMMONIA in the last 168 hours. CBC:  Recent Labs Lab 08/13/15 1100 08/14/15 1100 08/15/15 0520  WBC 16.0* 16.7* 14.2*  NEUTROABS 14.0*  --   --   HGB 13.7 11.0* 8.8*  HCT 41.6 33.0* 27.4*  MCV 102.2* 99.4 101.1*  PLT 217 187 150    Recent Results (from the past 240 hour(s))  Urine culture     Status: None   Collection Time: 08/13/15 11:02 AM  Result Value Ref Range Status   Specimen Description URINE, CATHETERIZED  Final   Special Requests Normal  Final   Culture NO GROWTH 1 DAY  Final   Report Status 08/14/2015 FINAL  Final  Blood culture (routine x 2)     Status: None (Preliminary result)   Collection Time: 08/13/15  1:57 PM  Result Value Ref Range Status   Specimen Description BLOOD RIGHT ANTECUBITAL  Final   Special Requests   Final    BOTTLES DRAWN AEROBIC AND  ANAEROBIC 5CC DRAWN AFTER ABX   Culture NO GROWTH < 24 HOURS  Final   Report Status PENDING  Incomplete  MRSA PCR Screening     Status: None   Collection Time: 08/13/15  4:59 PM  Result Value Ref Range Status   MRSA by PCR NEGATIVE NEGATIVE Final    Comment:        The GeneXpert MRSA Assay (FDA approved for NASAL specimens only), is one component of a comprehensive MRSA colonization surveillance program. It is not intended to diagnose MRSA infection nor to guide or monitor treatment for MRSA infections.   Blood culture (routine x 2)  Status: None (Preliminary result)   Collection Time: 08/13/15  7:20 PM  Result Value Ref Range Status   Specimen Description BLOOD LEFT HAND  Final   Special Requests IN PEDIATRIC BOTTLE 2CC  Final   Culture NO GROWTH < 24 HOURS  Final   Report Status PENDING  Incomplete     Studies: Ct Abdomen Pelvis W Contrast  08/14/2015  CLINICAL DATA:  Abdominal pain, fever and distension today. Initial encounter. EXAM: CT ABDOMEN AND PELVIS WITH CONTRAST TECHNIQUE: Multidetector CT imaging of the abdomen and pelvis was performed using the standard protocol following bolus administration of intravenous contrast. CONTRAST:  75 mL OMNIPAQUE IOHEXOL 300 MG/ML  SOLN COMPARISON:  None. FINDINGS: Dependent atelectasis is seen in the lung bases. Heart size is upper normal. No pleural or pericardial effusion. A 0.5 cm hypo attenuating lesion in the right lobe of the liver on image 30 cannot be definitively characterized but is likely a cyst. The liver is otherwise unremarkable. The gallbladder is mildly distended but otherwise appears normal. The adrenal glands and spleen appear normal. There is some fatty atrophy of the pancreas without focal lesion. The left kidney appears normal. There is a small right renal cyst. The right kidney is otherwise unremarkable. Extensive aortoiliac atherosclerosis without aneurysm is identified. Foley catheter is in place in a decompressed  urinary bladder. Uterus and adnexa are unremarkable. There is a large volume of stool throughout the colon, particularly the transverse and descending colon. The walls of the transverse colon are thickened with surrounding stranding. A small amount of interloop fluid is present centered about the transverse and descending colon. No portal venous gas, free intraperitoneal air or pneumatosis is identified. No obstructing colonic lesion is seen. The patient has a small hiatal hernia. This stomach is otherwise unremarkable. Small bowel loops appear normal. There is no lymphadenopathy or fluid collection. The patient has multilevel lumbar spondylosis. Chronic bilateral L5 pars interarticularis defects result in 1.4 cm anterolisthesis L5 on S1. There is convex left scoliosis. IMPRESSION: Large stool burden throughout the colon with wall thickening and stranding about the transverse and descending colon most consistent with colitis. No CT signs of ischemia are identified and no obstructing mass is seen. Extensive atherosclerosis without aneurysm. Marked multilevel lumbar spondylosis with bilateral L5 pars interarticularis defects resulting in 1.4 cm anterolisthesis. Electronically Signed   By: Inge Rise M.D.   On: 08/14/2015 18:18   Dg Chest Port 1 View  08/14/2015  CLINICAL DATA:  Dyspnea.  Wheezing.  Fever. EXAM: PORTABLE CHEST 1 VIEW COMPARISON:  08/13/2015. FINDINGS: Grossly stable enlarged cardiac silhouette. A poor inspiration is again demonstrated. Mildly increased bibasilar atelectasis. The interstitial markings remain diffusely prominent. Right PICC tip at the superior cavoatrial junction. Mild scoliosis. IMPRESSION: 1. Increased bibasilar atelectasis. 2. Stable mild cardiomegaly and mild chronic interstitial lung disease. Electronically Signed   By: Claudie Revering M.D.   On: 08/14/2015 11:25    Scheduled Meds: . docusate sodium  100 mg Oral BID  . enoxaparin (LOVENOX) injection  50 mg Subcutaneous  Q24H  . insulin aspart  0-9 Units Subcutaneous 6 times per day  . phenytoin (DILANTIN) IV  100 mg Intravenous 3 times per day  . piperacillin-tazobactam (ZOSYN)  IV  3.375 g Intravenous Q8H  . polyethylene glycol  17 g Oral Daily  . sodium chloride flush  10-40 mL Intracatheter Q12H  . sodium chloride flush  3 mL Intravenous Q12H  . vancomycin  1,250 mg Intravenous Q24H   Continuous Infusions: .  dextrose 5 % and 0.45% NaCl 75 mL/hr at 08/15/15 [id number]    Principal Problem:   Sepsis due to urinary tract infection ([place]) Active Problems:   Essential hypertension   HLD (hyperlipidemia)   Severe obesity (BMI >= 40) ([facility])   Seizure disorder ([facility])   Urinary incontinence   History of CVA (cerebrovascular accident)   AKI (acute kidney injury) ([place])   Metabolic acidemia   Sepsis ([place])   Abdominal distension   Dyspnea   Encounter for palliative care   Goals of care, counseling/discussion    Time spent: 30 min    [facility] Pager [phone]. If 7PM-7AM, please contact night-coverage at [email or web address], password [facility] 08/15/2015, 1:49 PM  LOS: 2 days

## 2015-08-15 NOTE — Progress Notes (Addendum)
Initial Nutrition Assessment  DOCUMENTATION CODES:   Morbid obesity  INTERVENTION:   Advance diet as medically appropriate, add supplements accordingly  NUTRITION DIAGNOSIS:   Inadequate oral intake related to inability to eat as evidenced by NPO status  GOAL:   Patient will meet greater than or equal to 90% of their needs  MONITOR:   PO intake, Supplement acceptance, Labs, Weight trends, I & O's  REASON FOR ASSESSMENT:   Low Braden  ASSESSMENT:   80 yo Female with history of hypertension, seizure disorder, prediabetes, chronic urinary incontinence, dyslipidemia, severe obesity, prior CVA, anxiety and depression and history of chronic leg pain who was sent to the ER by her family due to altered mentation. She was admitted to the hospitalist service with a working diagnosis of UTI. Of note patient arrived with a completed DO NOT RESUSCITATE form in place. In the ER, she was febrile to 102F and tachycardic. CXR demonstrated no infiltrate and UA had only 0-5 WBC. CT head was without acute abnormality. She was started on broad spectrum antibiotics in the ER and narrowed to ceftriaxone after UA resulted.   Pt sleepy, lethargic upon RD visit.  Spoke with pt's daughter at bedside.  Daughter reports pt was eating well PTA.  No recent weight loss.  S/p swallow evaluation 1/25.  Currently NPO.  Question whether pt will need short term nutrition support.  Low braden score places patient at risk for skin breakdown.  Nutrition focused physical exam completed.  No muscle or subcutaneous fat depletion noticed.  Diet Order:  Diet NPO time specified  Skin:  Reviewed, no issues  Last BM:  N/A  Height:   Ht Readings from Last 1 Encounters:  08/13/15 5\' 3"  (1.6 m)    Weight:   Wt Readings from Last 1 Encounters:  08/15/15 245 lb 13 oz (111.5 kg)    Ideal Body Weight:  52.2 kg  BMI:  Body mass index is 43.55 kg/(m^2).  Estimated Nutritional Needs:   Kcal:   1700-1900  Protein:  90-100 gm  Fluid:  1.7-1.9 L  EDUCATION NEEDS:   No education needs identified at this time  Arthur Holms, RD, LDN Pager #: [phone] After-Hours Pager #: [phone]

## 2015-08-15 NOTE — Progress Notes (Signed)
Speech Language Pathology Treatment: Dysphagia  Patient Details Name: Irva Moralas MRN: [id number] DOB: 11-16-1935 Today's Date: 08/15/2015 Time: [id number] SLP Time Calculation (min) (ACUTE ONLY): 22 min  Assessment / Plan / Recommendation Clinical Impression  Pt seen for ability to initiate po's. Pt keeps eyes closed; awareness decreased however improved from yesterday. Orally accepted ice chip and applesauce with prolonged transit and suspected decreased oral cohesion with ice chip/water. Pt extends head at times increasing risk with thin liquids. No cough or throat clear. Vocal quality intermittently judged clear during moaning. Educated daughter re: initiation of Dys 1 and honey thick liquids and feed only when alert (daughter voiced understanding and is a Marine scientist- family present except from 10:00 until early morning). Will continue to follow for safety with recommendations.    HPI HPI: 80 year old female patient with history of hypertension, seizure disorder, prediabetes, chronic urinary incontinence, dyslipidemia, severe obesity, prior CVA, anxiety and depression and history of chronic leg pain who was sent to the ER by her family due to altered mentation. CXR demonstrated no infiltrate and family denies difficulties with swallowing PTA.      SLP Plan  Continue with current plan of care     Recommendations  Diet recommendations: Dysphagia 1 (puree);Honey-thick liquid Liquids provided via: Cup;No straw Medication Administration: Crushed with puree Supervision: Full supervision/cueing for compensatory strategies;Staff to assist with self feeding Compensations: Slow rate;Small sips/bites;Minimize environmental distractions Postural Changes and/or Swallow Maneuvers: Seated upright 90 degrees;Upright 30-60 min after meal             Oral Care Recommendations: Oral care BID Follow up Recommendations:  (TBD) Plan: Continue with current plan of care     GO                Houston Siren 08/15/2015, 2:31 PM  Orbie Pyo Najai Waszak M.Ed [employer] [phone]

## 2015-08-16 DIAGNOSIS — R1084 Generalized abdominal pain: Secondary | ICD-10-CM

## 2015-08-16 DIAGNOSIS — R109 Unspecified abdominal pain: Secondary | ICD-10-CM | POA: Insufficient documentation

## 2015-08-16 DIAGNOSIS — R1030 Lower abdominal pain, unspecified: Secondary | ICD-10-CM

## 2015-08-16 LAB — RESPIRATORY VIRUS PANEL
ADENOVIRUS: NEGATIVE
INFLUENZA A: NEGATIVE
Influenza B: NEGATIVE
Metapneumovirus: NEGATIVE
Parainfluenza 1: NEGATIVE
Parainfluenza 2: NEGATIVE
Parainfluenza 3: NEGATIVE
RESPIRATORY SYNCYTIAL VIRUS A: NEGATIVE
RESPIRATORY SYNCYTIAL VIRUS B: NEGATIVE
RHINOVIRUS: NEGATIVE

## 2015-08-16 LAB — CBC
HEMATOCRIT: 27.7 % — AB (ref 36.0–46.0)
HEMOGLOBIN: 9.1 g/dL — AB (ref 12.0–15.0)
MCH: 32.9 pg (ref 26.0–34.0)
MCHC: 32.9 g/dL (ref 30.0–36.0)
MCV: 100 fL (ref 78.0–100.0)
PLATELETS: 161 10*3/uL (ref 150–400)
RBC: 2.77 MIL/uL — AB (ref 3.87–5.11)
RDW: 13.4 % (ref 11.5–15.5)
WBC: 16.4 10*3/uL — AB (ref 4.0–10.5)

## 2015-08-16 LAB — COMPREHENSIVE METABOLIC PANEL
ALT: 33 U/L (ref 14–54)
AST: 77 U/L — ABNORMAL HIGH (ref 15–41)
Albumin: 1.9 g/dL — ABNORMAL LOW (ref 3.5–5.0)
Alkaline Phosphatase: 57 U/L (ref 38–126)
Anion gap: 7 (ref 5–15)
BUN: 32 mg/dL — ABNORMAL HIGH (ref 6–20)
CHLORIDE: 113 mmol/L — AB (ref 101–111)
CO2: 20 mmol/L — ABNORMAL LOW (ref 22–32)
Calcium: 7.7 mg/dL — ABNORMAL LOW (ref 8.9–10.3)
Creatinine, Ser: 1.13 mg/dL — ABNORMAL HIGH (ref 0.44–1.00)
GFR, EST AFRICAN AMERICAN: 53 mL/min — AB (ref >60)
GFR, EST NON AFRICAN AMERICAN: 46 mL/min — AB (ref >60)
Glucose, Bld: 128 mg/dL — ABNORMAL HIGH (ref 65–99)
POTASSIUM: 3.3 mmol/L — AB (ref 3.5–5.1)
Sodium: 140 mmol/L (ref 135–145)
Total Bilirubin: 1 mg/dL (ref 0.3–1.2)
Total Protein: 4.9 g/dL — ABNORMAL LOW (ref 6.5–8.1)

## 2015-08-16 LAB — GLUCOSE, CAPILLARY
GLUCOSE-CAPILLARY: 117 mg/dL — AB (ref 65–99)
Glucose-Capillary: 112 mg/dL — ABNORMAL HIGH (ref 65–99)
Glucose-Capillary: 120 mg/dL — ABNORMAL HIGH (ref 65–99)
Glucose-Capillary: 146 mg/dL — ABNORMAL HIGH (ref 65–99)
Glucose-Capillary: 122 mg/dL — ABNORMAL HIGH (ref 65–99)
Glucose-Capillary: 147 mg/dL — ABNORMAL HIGH (ref 65–99)

## 2015-08-16 LAB — MAGNESIUM: MAGNESIUM: 1.7 mg/dL (ref 1.7–2.4)

## 2015-08-16 MED ORDER — POLYETHYLENE GLYCOL 3350 17 G PO PACK
17.0000 g | PACK | Freq: Two times a day (BID) | ORAL | Status: DC
  Administered 2015-08-16 – 2015-08-19 (×8): 17 g via ORAL
  Filled 2015-08-16 (×8): qty 1

## 2015-08-16 MED ORDER — KCL IN DEXTROSE-NACL 30-5-0.45 MEQ/L-%-% IV SOLN
INTRAVENOUS | Status: DC
  Administered 2015-08-16: 125 mL/h via INTRAVENOUS
  Administered 2015-08-17: 02:00:00 via INTRAVENOUS
  Filled 2015-08-16 (×7): qty 1000

## 2015-08-16 MED ORDER — SENNOSIDES-DOCUSATE SODIUM 8.6-50 MG PO TABS
1.0000 | ORAL_TABLET | Freq: Two times a day (BID) | ORAL | Status: DC
  Administered 2015-08-16 – 2015-08-22 (×12): 1 via ORAL
  Filled 2015-08-16 (×12): qty 1

## 2015-08-16 NOTE — Plan of Care (Signed)
Problem: Education: Goal: Knowledge of [facility] Education information/materials will improve Outcome: Progressing Patient is still confused but more alert and responsive.  Daughter at bedside.

## 2015-08-16 NOTE — Progress Notes (Signed)
SLP Cancellation Note  Patient Details Name: Carol Page MRN: [id number] DOB: 1936-03-15   Cancelled treatment:        Pt NPO; question of colitis per MD note. Will attempt possible diet upgrade when able.    Houston Siren 08/16/2015, 9:14 AM Orbie Pyo Colvin Caroli.Ed [employer] [phone]

## 2015-08-16 NOTE — Progress Notes (Signed)
[facility] PROGRESS NOTE  Naysha Tuffy [id number] DOB: 09/06/1935 DOA: 08/13/2015 PCP: Webb Silversmith, NP  Brief Summary  This is a 80 year old female patient with history of hypertension, seizure disorder, prediabetes, chronic urinary incontinence, dyslipidemia, severe obesity, prior CVA, anxiety and depression and history of chronic leg pain who was sent to the ER by her family due to altered mentation. At time of my assessment patient no family was at the bedside. Per ED provider and documentation in the EMR EMS reported that the patient's daughter stated that the patient was last seen normal at 9 PM last night on 1/23 and when the family came by to check her this morning she was less responsive and confused. It was documented she had been drowsy with "UTI type symptoms" based on family report. CBG was 154 at the scene. Because of significant altered mentation patient was to contribute to history portion of exam. Of note patient arrived with a completed DO NOT RESUSCITATE form in place.  In the ER, she was febrile to 102F and tachycardic.  CXR demonstrated no infiltrate and UA had only 0-5 WBC.  CT head was without acute abnormality.  She was started on broad spectrum antibiotics in the ER and narrowed to ceftriaxone after UA resulted.    Assessment/Plan  Sepsis due to colitis?  Initially, it was thought that she might have a urinary tract infection, however her urinalysis demonstrated only 0-5 white blood cells. She continued to spike high fevers on ceftriaxone overnight and remained tachycardic with borderline low blood pressures.    Switched to  vancomycin and Zosyn, now day 3, blood cultures negative, will DC vancomycin -  Repeat chest x-ray: Bilateral atelectasis versus infiltrate -  CT scan of abdomen and pelvis Large stool burden throughout the colon with wall thickening and stranding about the transverse and descending colon most consistent with colitis. Patient disimpacted and  had 3 bowel movements yesterday, continue aggressive constipation protocol Continue IV fluids - respiratory viral panel pending - PICC line placed 1/24  Acute toxic encephalopathy-resolved Likely secondary to infection, acute kidney injury  AKI / Metabolic acidemia, resolving with IVF -Continue to hold thiazide diuretic and ACE inhibitor -Hydrate as above Cr increased overnight 1.25>1.72> 1.13  Hypokalemia Replete and check a magnesium level   Essential hypertension, with borderline hypertension -  Continue to hold antihypertensives   Seizure disorder, phenytoin level mildly elevated, which may be secondary to dehydration - Repeat Dilantin level tomorrow morning -Continue Dilantin at current dose   Prediabetes,  -Continue CBG every 4 hours,-SSI,   Urinary incontinence - DC Foley catheter   HLD  -Preadmission statin on hold   Severe obesity (BMI >= 40)    History of CVA  -Preadmission Plavix and statin on hold  Constipation Start the patient on MiraLAX    Diet:  NPO except meds;Other (Comment) (few, small bites of puree/sips of water PRN) per speech  Access:  PIV IVF:  yes Proph:  lovenox  Code Status: DNR Family Communication: patient, her daughter and her son-in-law Disposition Plan: pending improvement in fevers   Consultants:  none  Procedures:  CT head  CT abd/pelvis  Antibiotics:  Vancomycin and zosyn 1/25 >   HPI/Subjective:   Significant improvement this morning from yesterday, much more awake alert interactive, vitals are stable  Objective: Filed Vitals:   08/16/15 0207 08/16/15 0412 08/16/15 0500 08/16/15 0813  BP: 111/59 144/66  124/57  Pulse: 103 109  102  Temp: 98.7 F (37.1 C) 98.8 F (  37.1 C)  98.9 F (37.2 C)  TempSrc: Oral Oral  Oral  Resp: 18 21  19   Height:      Weight:   113 kg (249 lb 1.9 oz)   SpO2: 100% 100%  100%    Intake/Output Summary (Last 24 hours) at 08/16/15 0852 Last data filed at 08/16/15  0600  Gross per 24 hour  Intake 1786.33 ml  Output   1675 ml  Net 111.33 ml   Filed Weights   08/14/15 0424 08/15/15 0441 08/16/15 0500  Weight: 108.3 kg (238 lb 12.1 oz) 111.5 kg (245 lb 13 oz) 113 kg (249 lb 1.9 oz)   Body mass index is 44.14 kg/(m^2).  Exam:   General:  No acute distress, adult female  HEENT:  NCAT, MMM, no nuchal rigidity  Cardiovascular:  RRR, nl S1, S2 no mrg, 2+ pulses, warm extremities  Respiratory:  Wheezing slightly bilaterally, no increased WOB  Abdomen:   Hypoactive BS, moderately distended, and TTP diffusely, worse around umbilicus with some guarding, no rebound.    MSK:   Normal tone and bulk, 1+ edema of the left ankle and no right ankle edema (chronic)  Neuro:  Diffusely weak, slightly more pronounced on right side  Data Reviewed: Basic Metabolic Panel:  Recent Labs Lab 08/13/15 1100 08/14/15 1100 08/15/15 0520 08/16/15 0425  NA 136 138 137 140  K 4.6 4.5 3.7 3.3*  CL 100* 108 110 113*  CO2 19* 19* 20* 20*  GLUCOSE 176* 149* 131* 128*  BUN 29* 33* 43* 32*  CREATININE 1.54* 1.25* 1.72* 1.13*  CALCIUM 9.2 7.6* 7.3* 7.7*   Liver Function Tests:  Recent Labs Lab 08/13/15 1100 08/16/15 0425  AST 34 77*  ALT 34 33  ALKPHOS 93 57  BILITOT 0.8 1.0  PROT 6.6 4.9*  ALBUMIN 3.6 1.9*    Recent Labs Lab 08/13/15 1100  LIPASE 29   No results for input(s): AMMONIA in the last 168 hours. CBC:  Recent Labs Lab 08/13/15 1100 08/14/15 1100 08/15/15 0520 08/16/15 0425  WBC 16.0* 16.7* 14.2* 16.4*  NEUTROABS 14.0*  --   --   --   HGB 13.7 11.0* 8.8* 9.1*  HCT 41.6 33.0* 27.4* 27.7*  MCV 102.2* 99.4 101.1* 100.0  PLT 217 187 150 161    Recent Results (from the past 240 hour(s))  Urine culture     Status: None   Collection Time: 08/13/15 11:02 AM  Result Value Ref Range Status   Specimen Description URINE, CATHETERIZED  Final   Special Requests Normal  Final   Culture NO GROWTH 1 DAY  Final   Report Status 08/14/2015  FINAL  Final  Blood culture (routine x 2)     Status: None (Preliminary result)   Collection Time: 08/13/15  1:57 PM  Result Value Ref Range Status   Specimen Description BLOOD RIGHT ANTECUBITAL  Final   Special Requests   Final    BOTTLES DRAWN AEROBIC AND ANAEROBIC 5CC DRAWN AFTER ABX   Culture NO GROWTH 2 DAYS  Final   Report Status PENDING  Incomplete  MRSA PCR Screening     Status: None   Collection Time: 08/13/15  4:59 PM  Result Value Ref Range Status   MRSA by PCR NEGATIVE NEGATIVE Final    Comment:        The GeneXpert MRSA Assay (FDA approved for NASAL specimens only), is one component of a comprehensive MRSA colonization surveillance program. It is not intended to diagnose MRSA  infection nor to guide or monitor treatment for MRSA infections.   Blood culture (routine x 2)     Status: None (Preliminary result)   Collection Time: 08/13/15  7:20 PM  Result Value Ref Range Status   Specimen Description BLOOD LEFT HAND  Final   Special Requests IN PEDIATRIC BOTTLE 2CC  Final   Culture NO GROWTH 2 DAYS  Final   Report Status PENDING  Incomplete  Culture, blood (single) w Reflex to ID Panel     Status: None (Preliminary result)   Collection Time: 08/14/15  1:39 PM  Result Value Ref Range Status   Specimen Description BLOOD LEFT ANTECUBITAL  Final   Special Requests IN PEDIATRIC BOTTLE 3CC  Final   Culture NO GROWTH < 24 HOURS  Final   Report Status PENDING  Incomplete     Studies: Ct Abdomen Pelvis W Contrast  08/14/2015  CLINICAL DATA:  Abdominal pain, fever and distension today. Initial encounter. EXAM: CT ABDOMEN AND PELVIS WITH CONTRAST TECHNIQUE: Multidetector CT imaging of the abdomen and pelvis was performed using the standard protocol following bolus administration of intravenous contrast. CONTRAST:  75 mL OMNIPAQUE IOHEXOL 300 MG/ML  SOLN COMPARISON:  None. FINDINGS: Dependent atelectasis is seen in the lung bases. Heart size is upper normal. No pleural or  pericardial effusion. A 0.5 cm hypo attenuating lesion in the right lobe of the liver on image 30 cannot be definitively characterized but is likely a cyst. The liver is otherwise unremarkable. The gallbladder is mildly distended but otherwise appears normal. The adrenal glands and spleen appear normal. There is some fatty atrophy of the pancreas without focal lesion. The left kidney appears normal. There is a small right renal cyst. The right kidney is otherwise unremarkable. Extensive aortoiliac atherosclerosis without aneurysm is identified. Foley catheter is in place in a decompressed urinary bladder. Uterus and adnexa are unremarkable. There is a large volume of stool throughout the colon, particularly the transverse and descending colon. The walls of the transverse colon are thickened with surrounding stranding. A small amount of interloop fluid is present centered about the transverse and descending colon. No portal venous gas, free intraperitoneal air or pneumatosis is identified. No obstructing colonic lesion is seen. The patient has a small hiatal hernia. This stomach is otherwise unremarkable. Small bowel loops appear normal. There is no lymphadenopathy or fluid collection. The patient has multilevel lumbar spondylosis. Chronic bilateral L5 pars interarticularis defects result in 1.4 cm anterolisthesis L5 on S1. There is convex left scoliosis. IMPRESSION: Large stool burden throughout the colon with wall thickening and stranding about the transverse and descending colon most consistent with colitis. No CT signs of ischemia are identified and no obstructing mass is seen. Extensive atherosclerosis without aneurysm. Marked multilevel lumbar spondylosis with bilateral L5 pars interarticularis defects resulting in 1.4 cm anterolisthesis. Electronically Signed   By: Inge Rise M.D.   On: 08/14/2015 18:18   Dg Chest Port 1 View  08/14/2015  CLINICAL DATA:  Dyspnea.  Wheezing.  Fever. EXAM: PORTABLE  CHEST 1 VIEW COMPARISON:  08/13/2015. FINDINGS: Grossly stable enlarged cardiac silhouette. A poor inspiration is again demonstrated. Mildly increased bibasilar atelectasis. The interstitial markings remain diffusely prominent. Right PICC tip at the superior cavoatrial junction. Mild scoliosis. IMPRESSION: 1. Increased bibasilar atelectasis. 2. Stable mild cardiomegaly and mild chronic interstitial lung disease. Electronically Signed   By: Claudie Revering M.D.   On: 08/14/2015 11:25    Scheduled Meds: . docusate sodium  100 mg Oral BID  .  enoxaparin (LOVENOX) injection  50 mg Subcutaneous Q24H  . insulin aspart  0-9 Units Subcutaneous 6 times per day  . phenytoin (DILANTIN) IV  100 mg Intravenous 3 times per day  . piperacillin-tazobactam (ZOSYN)  IV  3.375 g Intravenous Q8H  . polyethylene glycol  17 g Oral Daily  . senna-docusate  1 tablet Oral BID  . sodium chloride flush  10-40 mL Intracatheter Q12H  . sodium chloride flush  3 mL Intravenous Q12H  . vancomycin  1,250 mg Intravenous Q24H   Continuous Infusions: . dexrose 5 % and 0.45 % NaCl with KCl 30 mEq/L      Principal Problem:   Sepsis due to urinary tract infection (HCC) Active Problems:   Essential hypertension   HLD (hyperlipidemia)   Severe obesity (BMI >= 40) (HCC)   Seizure disorder (HCC)   Urinary incontinence   History of CVA (cerebrovascular accident)   AKI (acute kidney injury) ([place])   Metabolic acidemia   Sepsis ([place])   Abdominal distension   Dyspnea   Encounter for palliative care   Goals of care, counseling/discussion   Abdominal pain    Time spent: 30 min    [facility] Pager [phone]. If 7PM-7AM, please contact night-coverage at [email or web address], password [facility] 08/16/2015, 8:52 AM  LOS: 3 days

## 2015-08-16 NOTE — Care Management Important Message (Signed)
Important Message  Patient Details  Name: Carol Page MRN: [id number] Date of Birth: Jun 21, 1936   Medicare Important Message Given:       Girard Cooter, RN 08/16/2015, 11:22 AM

## 2015-08-16 NOTE — Progress Notes (Signed)
Daily Progress Note   Patient Name: Carol Page       Date: 08/16/2015 DOB: 1935/10/27  Age: 80 y.o. MRN#: [id number] Attending Physician: Reyne Dumas, MD Primary Care Physician: Webb Silversmith, NP Admit Date: 08/13/2015  Reason for Consultation/Follow-up: Establishing goals of care  Subjective:  Patient is awake alert resting in bed Interval Events:  CT scan of the abdomen and pelvis alluded to large stool burden, colitis in transverse and descending colon. Patient remains on antibiotics. Leukocytosis but creatinine slightly better. Patient has been started on stool regimen consisting of scheduled MiraLAX and schedule Senokot-S. Daughter is present at the bedside. Patient answers all questions appropriately. She has good appetite. Denies nausea or vomiting. She has had chronic constipation. Patient's daughter states she basically remains bedbound at home and does not drink enough liquids. The patient has taken Ex-Lax and MiraLAX powder at home pretty much on a regular basis. She is not on any opioids at home. Continue current scope of care. Agree with current bowel regimen. No other additional palliative recommendations at this time. Length of Stay: 3 days  Current Medications: Scheduled Meds:  . docusate sodium  100 mg Oral BID  . enoxaparin (LOVENOX) injection  50 mg Subcutaneous Q24H  . insulin aspart  0-9 Units Subcutaneous 6 times per day  . phenytoin (DILANTIN) IV  100 mg Intravenous 3 times per day  . piperacillin-tazobactam (ZOSYN)  IV  3.375 g Intravenous Q8H  . polyethylene glycol  17 g Oral BID  . senna-docusate  1 tablet Oral BID  . sodium chloride flush  10-40 mL Intracatheter Q12H  . sodium chloride flush  3 mL Intravenous Q12H    Continuous Infusions: . dexrose 5 %  and 0.45 % NaCl with KCl 30 mEq/L 125 mL/hr (08/16/15 0947)    PRN Meds: acetaminophen **OR** acetaminophen, iohexol, iohexol, ondansetron **OR** ondansetron (ZOFRAN) IV, sodium chloride, sodium chloride flush  Physical Exam: Physical Exam             Weak appearing elderly lady Pain generalized abdomen, even with slight palpation, voluntary guarding S1 S2 Clear anteriorly Awake alert answers all questions appropriately Trace edema  Vital Signs: BP 124/57 mmHg  Pulse 102  Temp(Src) 98.9 F (37.2 C) (Oral)  Resp 19  Ht 5\' 3"  (1.6 m)  Wt 113 kg (249  lb 1.9 oz)  BMI 44.14 kg/m2  SpO2 100% SpO2: SpO2: 100 % O2 Device: O2 Device: Nasal Cannula O2 Flow Rate: O2 Flow Rate (L/min): 2 L/min  Intake/output summary:  Intake/Output Summary (Last 24 hours) at 08/16/15 1132 Last data filed at 08/16/15 0900  Gross per 24 hour  Intake 1933.33 ml  Output   1675 ml  Net 258.33 ml   LBM: Last BM Date: 08/15/15 Baseline Weight: Weight: 108.6 kg (239 lb 6.7 oz) Most recent weight: Weight: 113 kg (249 lb 1.9 oz)       Palliative Assessment/Data: Flowsheet Rows        Most Recent Value   Intake Tab    Referral Department  Hospitalist   Unit at Time of Referral  Intermediate Care Unit   Palliative Care Primary Diagnosis  Neurology   Date Notified  08/13/15   Palliative Care Type  New Palliative care   Reason for referral  Clarify Goals of Care   Date of Admission  08/13/15   Date first seen by Palliative Care  08/14/15   # of days Palliative referral response time  1 Day(s)   # of days IP prior to Palliative referral  0   Clinical Assessment    Palliative Performance Scale Score  30%   Pain Max last 24 hours  5   Pain Min Last 24 hours  4   Dyspnea Max Last 24 Hours  4   Dyspnea Min Last 24 hours  3   Psychosocial & Spiritual Assessment    Palliative Care Outcomes    Patient/Family meeting held?  Yes   Who was at the meeting?  daughter patient son in law   [place] goals of care   Palliative Care follow-up planned  Yes, Facility      Additional Data Reviewed: CBC    Component Value Date/Time   WBC 16.4* 08/16/2015 0425   RBC 2.77* 08/16/2015 0425   HGB 9.1* 08/16/2015 0425   HCT 27.7* 08/16/2015 0425   PLT 161 08/16/2015 0425   MCV 100.0 08/16/2015 0425   MCH 32.9 08/16/2015 0425   MCHC 32.9 08/16/2015 0425   RDW 13.4 08/16/2015 0425   LYMPHSABS 0.5* 08/13/2015 1100   MONOABS 1.5* 08/13/2015 1100   EOSABS 0.0 08/13/2015 1100   BASOSABS 0.0 08/13/2015 1100    CMP     Component Value Date/Time   NA 140 08/16/2015 0425   K 3.3* 08/16/2015 0425   CL 113* 08/16/2015 0425   CO2 20* 08/16/2015 0425   GLUCOSE 128* 08/16/2015 0425   BUN 32* 08/16/2015 0425   CREATININE 1.13* 08/16/2015 0425   CALCIUM 7.7* 08/16/2015 0425   PROT 4.9* 08/16/2015 0425   ALBUMIN 1.9* 08/16/2015 0425   AST 77* 08/16/2015 0425   ALT 33 08/16/2015 0425   ALKPHOS 57 08/16/2015 0425   BILITOT 1.0 08/16/2015 0425   GFRNONAA 46* 08/16/2015 0425   GFRAA 53* 08/16/2015 0425       Problem List:  Patient Active Problem List   Diagnosis Date Noted  . Abdominal pain   . Abdominal distension   . Dyspnea   . Encounter for palliative care   . Goals of care, counseling/discussion   . Sepsis due to urinary tract infection ([place]) 08/13/2015  . AKI (acute kidney injury) ([place]) 08/13/2015  . Metabolic acidemia [id number]  . Sepsis ([place]) 08/13/2015  . Anxiety and depression 10/06/2013  . Urinary incontinence 10/06/2013  . Chronic leg pain  10/06/2013  . History of CVA (cerebrovascular accident) 10/06/2013  . Essential hypertension 11/09/2012  . HLD (hyperlipidemia) 11/09/2012  . Severe obesity (BMI >= 40) ([place]) 11/09/2012  . Pre-diabetes 11/09/2012  . Seizure disorder ([place]) 11/09/2012     Palliative Care Assessment & Plan    1.Code Status:  DNR    Code Status Orders        Start     Ordered   08/13/15 1615  Do not attempt  resuscitation (DNR)   Continuous    Question Answer Comment  In the event of cardiac or respiratory ARREST Do not call a "code blue"   In the event of cardiac or respiratory ARREST Do not perform Intubation, CPR, defibrillation or ACLS   In the event of cardiac or respiratory ARREST Use medication by any route, position, wound care, and other measures to relive pain and suffering. May use oxygen, suction and manual treatment of airway obstruction as needed for comfort.      08/13/15 1614    Code Status History    Date Active Date Inactive Code Status Order ID Comments User Context   04/16/2014  3:47 PM 04/17/2014  5:20 PM Full Code [id number]  Debbe Odea, MD ED   05/22/2013  8:11 PM 05/24/2013  7:04 PM Full Code [id number]  Kinnie Feil, MD Inpatient    Advance Directive Documentation        Most Recent Value   Type of Advance Directive  Out of facility DNR (pink MOST or yellow form)   Pre-existing out of facility DNR order (yellow form or pink MOST form)  -- [at bedside]   "MOST" Form in Place?         2. Goals of Care/Additional Recommendations:     Limitations on Scope of Treatment: none expect DNR DNI  Desire for further Chaplaincy support:no  Psycho-social Needs: Caregiving  Support/Resources  3. Symptom Management:      1. Agree with current bowel regimen.   4. Palliative Prophylaxis:   Bowel Regimen  5. Prognosis: ? < 12 months  6. Discharge Planning:  Home with Home Health versus SNF rehab.    Care plan was discussed with patient and daughter   Thank you for allowing the Palliative Medicine Team to assist in the care of this patient.   Time In: 9 Time Out: 9:25 Total Time 25 Prolonged Time Billed  no       [phone]   Loistine Chance, MD  08/16/2015, 11:32 AM  Please contact Palliative Medicine Team phone at [phone] for questions and concerns.

## 2015-08-17 DIAGNOSIS — R1012 Left upper quadrant pain: Secondary | ICD-10-CM

## 2015-08-17 LAB — COMPREHENSIVE METABOLIC PANEL
ALBUMIN: 1.8 g/dL — AB (ref 3.5–5.0)
ALK PHOS: 68 U/L (ref 38–126)
ALT: 32 U/L (ref 14–54)
ANION GAP: 9 (ref 5–15)
AST: 49 U/L — ABNORMAL HIGH (ref 15–41)
BUN: 18 mg/dL (ref 6–20)
CALCIUM: 7.9 mg/dL — AB (ref 8.9–10.3)
CHLORIDE: 110 mmol/L (ref 101–111)
CO2: 22 mmol/L (ref 22–32)
Creatinine, Ser: 0.82 mg/dL (ref 0.44–1.00)
GFR calc non Af Amer: >60 mL/min (ref >60)
Glucose, Bld: 147 mg/dL — ABNORMAL HIGH (ref 65–99)
POTASSIUM: 3.5 mmol/L (ref 3.5–5.1)
SODIUM: 141 mmol/L (ref 135–145)
Total Bilirubin: 0.8 mg/dL (ref 0.3–1.2)
Total Protein: 5.2 g/dL — ABNORMAL LOW (ref 6.5–8.1)

## 2015-08-17 LAB — CBC
HCT: 28.6 % — ABNORMAL LOW (ref 36.0–46.0)
Hemoglobin: 9.7 g/dL — ABNORMAL LOW (ref 12.0–15.0)
MCH: 33.4 pg (ref 26.0–34.0)
MCHC: 33.9 g/dL (ref 30.0–36.0)
MCV: 98.6 fL (ref 78.0–100.0)
PLATELETS: 162 10*3/uL (ref 150–400)
RBC: 2.9 MIL/uL — ABNORMAL LOW (ref 3.87–5.11)
RDW: 13.3 % (ref 11.5–15.5)
WBC: 15.5 10*3/uL — ABNORMAL HIGH (ref 4.0–10.5)

## 2015-08-17 LAB — GLUCOSE, CAPILLARY
GLUCOSE-CAPILLARY: 123 mg/dL — AB (ref 65–99)
GLUCOSE-CAPILLARY: 156 mg/dL — AB (ref 65–99)
GLUCOSE-CAPILLARY: 121 mg/dL — AB (ref 65–99)
GLUCOSE-CAPILLARY: 101 mg/dL — AB (ref 65–99)
Glucose-Capillary: 121 mg/dL — ABNORMAL HIGH (ref 65–99)
Glucose-Capillary: 129 mg/dL — ABNORMAL HIGH (ref 65–99)

## 2015-08-17 LAB — PROCALCITONIN: PROCALCITONIN: 4.08 ng/mL

## 2015-08-17 MED ORDER — SODIUM CHLORIDE 0.9 % IV SOLN: INTRAVENOUS | Status: DC

## 2015-08-17 MED ORDER — MAGNESIUM SULFATE 2 GM/50ML IV SOLN
2.0000 g | Freq: Once | INTRAVENOUS | Status: AC
  Administered 2015-08-17: 2 g via INTRAVENOUS
  Filled 2015-08-17: qty 50

## 2015-08-17 MED ORDER — POTASSIUM CHLORIDE IN NACL 20-0.9 MEQ/L-% IV SOLN
INTRAVENOUS | Status: DC
  Administered 2015-08-17: 10:00:00 via INTRAVENOUS
  Filled 2015-08-17 (×3): qty 1000

## 2015-08-17 NOTE — Progress Notes (Signed)
Speech Language Pathology Treatment: Dysphagia  Patient Details Name: Carol Page MRN: [id number] DOB: 01/29/1936 Today's Date: 08/17/2015 Time: [id number] SLP Time Calculation (min) (ACUTE ONLY): 23 min  Assessment / Plan / Recommendation Clinical Impression  Pt was significantly confused but alert when SLP arrived; she refused treatment initially and was oriented to name only; but with encouragement and coaxing, pt eventually consumed limited amounts of various consistencies with minimal verbal/visual cues including puree, nectar-thickened liquids and thin liquids per skilled observation with small sips/bites and eventual progression to nectar-thickened liquids via straw without overt s/s of aspiration noted; medication administration observed with crushed in puree and nectar-thickened liquid medication without any overt s/s of aspiration noted throughout dysphagia treatment session; pt was upgraded to a diet of Dysphagia 1/nectar-thickened liquids via MD request prior to SLP's arrival, so nectar-thickened liquids/puree were assessed initially without any difficulty; thin liquids via small sips appeared to be tolerated without overt s/s of aspiration, but d/t pt confusion/inattention/minimal oral holding and risk for aspiration with thin liquids at this time, nectar-thickened liquids are recommended with Dysphagia 1 diet with probable upgrade if pt continues to progress with LOA and goals on current POC.     HPI HPI: 80 year old female patient with history of hypertension, seizure disorder, prediabetes, chronic urinary incontinence, dyslipidemia, severe obesity, prior CVA, anxiety and depression and history of chronic leg pain who was sent to the ER by her family due to altered mentation. CXR demonstrated no infiltrate and family denies difficulties with swallowing PTA.      SLP Plan  Continue with current plan of care     Recommendations  Diet recommendations: Dysphagia 1 (puree);Nectar-thick  liquid Liquids provided via: Cup;Straw Medication Administration: Crushed with puree Supervision: Staff to assist with self feeding;Full supervision/cueing for compensatory strategies Compensations: Slow rate;Small sips/bites Postural Changes and/or Swallow Maneuvers: Seated upright 90 degrees             Oral Care Recommendations: Oral care BID Follow up Recommendations: Other (comment) (TBD) Plan: Continue with current plan of care                     ADAMS,PAT, M.S., CCC-SLP 08/17/2015, 10:33 AM

## 2015-08-17 NOTE — Progress Notes (Signed)
[facility] PROGRESS NOTE  Carol Page [id number] DOB: October 07, 1935 DOA: 08/13/2015 PCP: Webb Silversmith, NP  Brief Summary  This is a 80 year old female patient with history of hypertension, seizure disorder, prediabetes, chronic urinary incontinence, dyslipidemia, severe obesity, prior CVA, anxiety and depression and history of chronic leg pain who was sent to the ER by her family due to altered mentation. At time of my assessment patient no family was at the bedside. Per ED provider and documentation in the EMR EMS reported that the patient's daughter stated that the patient was last seen normal at 9 PM last night on 1/23 and when the family came by to check her this morning she was less responsive and confused. It was documented she had been drowsy with "UTI type symptoms" based on family report. CBG was 154 at the scene. Because of significant altered mentation patient was to contribute to history portion of exam. Of note patient arrived with a completed DO NOT RESUSCITATE form in place.  In the ER, she was febrile to 102F and tachycardic.  CXR demonstrated no infiltrate and UA had only 0-5 WBC.  CT head was without acute abnormality.  She was started on broad spectrum antibiotics in the ER and narrowed to ceftriaxone after UA resulted.    Assessment/Plan  Sepsis due to colitis?  Initially, it was thought that she might have a urinary tract infection, however her urinalysis demonstrated only 0-5 white blood cells. She continued to spike high fevers on ceftriaxone overnight and remained tachycardic with borderline low blood pressures.    Switched to  vancomycin and Zosyn, vancomycin has been discontinued, patient is currently on day 4 of Zosyn and is improving -  Repeat chest x-ray: Bilateral atelectasis versus infiltrate -  CT scan of abdomen and pelvis Large stool burden throughout the colon with wall thickening and stranding about the transverse and descending colon most consistent with  colitis. Patient disimpacted during this admission and she continues to have bowel movements Continue IV fluids - respiratory viral panel negative - PICC line placed 1/24 Initiated dysphagia 1 diet Transfer to MedSurg  Acute toxic encephalopathy-resolved Likely secondary to infection, acute kidney injury  AKI / Metabolic acidemia, resolving with IVF -Continue to hold thiazide diuretic and ACE inhibitor -Hydrate as above Cr increased overnight 1.25>1.72> 1.13> 0.82  Hypokalemia Replete and check a magnesium level   Essential hypertension, with borderline hypertension -  Continue to hold antihypertensives   Seizure disorder, phenytoin level mildly elevated, which may be secondary to dehydration - Repeat Dilantin level tomorrow morning -Continue Dilantin at current dose   Prediabetes,  -Continue CBG every 4 hours,-SSI,   Urinary incontinence - DC Foley catheter   HLD  -Preadmission statin on hold   Severe obesity (BMI >= 40)    History of CVA  -Preadmission Plavix and statin on hold  Constipation Start the patient on MiraLAX    Diet:  NPO except meds;Other (Comment) (few, small bites of puree/sips of water PRN) per speech  Access:  PIV IVF:  yes Proph:  lovenox  Code Status: DNR Family Communication: patient, her daughter and her son-in-law Disposition Plan: Transfer to [facility], PT eval   Consultants:  none  Procedures:  CT head  CT abd/pelvis  Antibiotics: Anti-infectives    Start     Dose/Rate Route Frequency Ordered Stop   08/14/15 1700  cefTRIAXone (ROCEPHIN) 1 g in dextrose 5 % 50 mL IVPB  Status:  Discontinued     1 g 100 mL/hr over 30  Minutes Intravenous Every 24 hours 08/13/15 1628 08/14/15 1026   08/14/15 1200  vancomycin (VANCOCIN) 1,250 mg in sodium chloride 0.9 % 250 mL IVPB  Status:  Discontinued     1,250 mg 166.7 mL/hr over 90 Minutes Intravenous Every 24 hours 08/14/15 1146 08/16/15 0856   08/14/15 1200   piperacillin-tazobactam (ZOSYN) IVPB 3.375 g     3.375 g 12.5 mL/hr over 240 Minutes Intravenous Every 8 hours 08/14/15 1146     08/13/15 1700  cefTRIAXone (ROCEPHIN) 2 g in dextrose 5 % 50 mL IVPB     2 g 100 mL/hr over 30 Minutes Intravenous  Once 08/13/15 1614 08/13/15 1752   08/13/15 1300  vancomycin (VANCOCIN) IVPB 1000 mg/200 mL premix  Status:  Discontinued     1,000 mg 200 mL/hr over 60 Minutes Intravenous  Once 08/13/15 1230 08/13/15 1453   08/13/15 1215  piperacillin-tazobactam (ZOSYN) IVPB 3.375 g     3.375 g 100 mL/hr over 30 Minutes Intravenous  Once 08/13/15 1209 08/13/15 1243   08/13/15 1215  vancomycin (VANCOCIN) IVPB 1000 mg/200 mL premix     1,000 mg 200 mL/hr over 60 Minutes Intravenous  Once 08/13/15 1209 08/13/15 1315       HPI/Subjective: Patient's vitals are stable, confused but stable, son in la[place]  by the bedside   Objective: Filed Vitals:   08/17/15 0417 08/17/15 0500 08/17/15 0600 08/17/15 0758  BP:    159/60  Pulse:  96 92 91  Temp:    98.3 F (36.8 C)  TempSrc:    Oral  Resp:  18 18 21   Height:      Weight: 113.7 kg (250 lb 10.6 oz)     SpO2:  95% 96% 97%    Intake/Output Summary (Last 24 hours) at 08/17/15 0831 Last data filed at 08/17/15 0600  Gross per 24 hour  Intake   3183 ml  Output   1550 ml  Net   1633 ml   Filed Weights   08/15/15 0441 08/16/15 0500 08/17/15 0417  Weight: 111.5 kg (245 lb 13 oz) 113 kg (249 lb 1.9 oz) 113.7 kg (250 lb 10.6 oz)   Body mass index is 44.41 kg/(m^2).  Exam:   General:  No acute distress, adult female  HEENT:  NCAT, MMM, no nuchal rigidity  Cardiovascular:  RRR, nl S1, S2 no mrg, 2+ pulses, warm extremities  Respiratory:  Wheezing slightly bilaterally, no increased WOB  Abdomen:   Hypoactive BS, moderately distended, and TTP diffusely, worse around umbilicus with some guarding, no rebound.    MSK:   Normal tone and bulk, 1+ edema of the left ankle and no right ankle edema (chronic)  Neuro:   Diffusely weak, slightly more pronounced on right side  Data Reviewed: Basic Metabolic Panel:  Recent Labs Lab 08/13/15 1100 08/14/15 1100 08/15/15 0520 08/16/15 0425 08/16/15 1229 08/17/15 0315  NA 136 138 137 140  --  141  K 4.6 4.5 3.7 3.3*  --  3.5  CL 100* 108 110 113*  --  110  CO2 19* 19* 20* 20*  --  22  GLUCOSE 176* 149* 131* 128*  --  147*  BUN 29* 33* 43* 32*  --  18  CREATININE 1.54* 1.25* 1.72* 1.13*  --  0.82  CALCIUM 9.2 7.6* 7.3* 7.7*  --  7.9*  MG  --   --   --   --  1.7  --    Liver Function Tests:  Recent Labs Lab  08/13/15 1100 08/16/15 0425 08/17/15 0315  AST 34 77* 49*  ALT 34 33 32  ALKPHOS 93 57 68  BILITOT 0.8 1.0 0.8  PROT 6.6 4.9* 5.2*  ALBUMIN 3.6 1.9* 1.8*    Recent Labs Lab 08/13/15 1100  LIPASE 29   No results for input(s): AMMONIA in the last 168 hours. CBC:  Recent Labs Lab 08/13/15 1100 08/14/15 1100 08/15/15 0520 08/16/15 0425 08/17/15 0315  WBC 16.0* 16.7* 14.2* 16.4* 15.5*  NEUTROABS 14.0*  --   --   --   --   HGB 13.7 11.0* 8.8* 9.1* 9.7*  HCT 41.6 33.0* 27.4* 27.7* 28.6*  MCV 102.2* 99.4 101.1* 100.0 98.6  PLT 217 187 150 161 162    Recent Results (from the past 240 hour(s))  Urine culture     Status: None   Collection Time: 08/13/15 11:02 AM  Result Value Ref Range Status   Specimen Description URINE, CATHETERIZED  Final   Special Requests Normal  Final   Culture NO GROWTH 1 DAY  Final   Report Status 08/14/2015 FINAL  Final  Blood culture (routine x 2)     Status: None (Preliminary result)   Collection Time: 08/13/15  1:57 PM  Result Value Ref Range Status   Specimen Description BLOOD RIGHT ANTECUBITAL  Final   Special Requests   Final    BOTTLES DRAWN AEROBIC AND ANAEROBIC 5CC DRAWN AFTER ABX   Culture NO GROWTH 3 DAYS  Final   Report Status PENDING  Incomplete  Respiratory virus panel     Status: None   Collection Time: 08/13/15  4:59 PM  Result Value Ref Range Status   Source - RVPAN  NASOPHARYNGEAL  Corrected   Respiratory Syncytial Virus A Negative Negative Final   Respiratory Syncytial Virus B Negative Negative Final   Influenza A Negative Negative Final   Influenza B Negative Negative Final   Parainfluenza 1 Negative Negative Final   Parainfluenza 2 Negative Negative Final   Parainfluenza 3 Negative Negative Final   Metapneumovirus Negative Negative Final   Rhinovirus Negative Negative Final   Adenovirus Negative Negative Final    Comment: (NOTE) Performed At: [facility] [address] [place], [place] [id number] Lindon Romp MD [id number]   MRSA PCR Screening     Status: None   Collection Time: 08/13/15  4:59 PM  Result Value Ref Range Status   MRSA by PCR NEGATIVE NEGATIVE Final    Comment:        The GeneXpert MRSA Assay (FDA approved for NASAL specimens only), is one component of a comprehensive MRSA colonization surveillance program. It is not intended to diagnose MRSA infection nor to guide or monitor treatment for MRSA infections.   Blood culture (routine x 2)     Status: None (Preliminary result)   Collection Time: 08/13/15  7:20 PM  Result Value Ref Range Status   Specimen Description BLOOD LEFT HAND  Final   Special Requests IN PEDIATRIC BOTTLE 2CC  Final   Culture NO GROWTH 3 DAYS  Final   Report Status PENDING  Incomplete  Culture, blood (single) w Reflex to ID Panel     Status: None (Preliminary result)   Collection Time: 08/14/15  1:39 PM  Result Value Ref Range Status   Specimen Description BLOOD LEFT ANTECUBITAL  Final   Special Requests IN PEDIATRIC BOTTLE 3CC  Final   Culture NO GROWTH 2 DAYS  Final   Report Status PENDING  Incomplete  Studies: No results found.  Scheduled Meds: . docusate sodium  100 mg Oral BID  . enoxaparin (LOVENOX) injection  50 mg Subcutaneous Q24H  . insulin aspart  0-9 Units Subcutaneous 6 times per day  . magnesium sulfate 1 - 4 g bolus IVPB  2 g Intravenous Once  . phenytoin  (DILANTIN) IV  100 mg Intravenous 3 times per day  . piperacillin-tazobactam (ZOSYN)  IV  3.375 g Intravenous Q8H  . polyethylene glycol  17 g Oral BID  . senna-docusate  1 tablet Oral BID  . sodium chloride flush  10-40 mL Intracatheter Q12H  . sodium chloride flush  3 mL Intravenous Q12H   Continuous Infusions: . sodium chloride      Principal Problem:   Sepsis due to urinary tract infection (HCC) Active Problems:   Essential hypertension   HLD (hyperlipidemia)   Severe obesity (BMI >= 40) (HCC)   Seizure disorder (HCC)   Urinary incontinence   History of CVA (cerebrovascular accident)   AKI (acute kidney injury) ([place])   Metabolic acidemia   Sepsis ([place])   Abdominal distension   Dyspnea   Encounter for palliative care   Goals of care, counseling/discussion   Abdominal pain    Time spent: 30 min    [facility] Pager [phone]. If 7PM-7AM, please contact night-coverage at [email or web address], password [email or web address] 08/17/2015, 8:31 AM  LOS: 4 days

## 2015-08-17 NOTE — Progress Notes (Signed)
Patient is transferred to 5N18 per hospital bed.  Pt's son-in-law at bedside during this transfer.

## 2015-08-17 NOTE — Evaluation (Signed)
Physical Therapy Evaluation Patient Details Name: Carol Page MRN: [id number] DOB: 09-Aug-1935 Today's Date: 08/17/2015   History of Present Illness  Pt. admitted 08/13/15 with sepsie due to colitis?, acute toxic encephalopathy, AKI/metabilic acedemia, large stool burden/impaction, severe obesity and chronic urinary incontinence.  Pt. with history of CVA, prediabetes, chronic leg pain, seizure disorder.  Pt. reportedly bed bound at home.    Clinical Impression  Pt. Presents to PT with difficulty following directions, decreased orientation and memory, severe obesity and limitations in functional mobility requiring +2 total assist to roll pt. In bed.  Pt. Unable to tolerate attempt today to sit at EOB due to weakness, fear and anxiousness.  I am concerned that pt.'s severe mobility restrictions and obesity have contributed to her constipation/large stool burden and would present great difficulty for management in the home environment unless multiple family members are available to assist.  PT will see on a trial basis to see if she has potential for increasing her mobility for decreasing caregiver burden.      Follow Up Recommendations SNF;Supervision/Assistance - 24 hour    Equipment Recommendations  Other (comment) (TBD once DC dispo is  confirmed)    Recommendations for Other Services       Precautions / Restrictions Precautions Precautions: Fall;Other (comment) (risk of skin breakdown due to limited mobility)      Mobility  Bed Mobility Overal bed mobility: +2 for physical assistance             General bed mobility comments: attempted to have pt. assist with rolling from side to side.  Pt. was able to reach either arm across body but unable to assist even minimally with rolling attempt, which required +2 total assist with no effort produced from pt.    Transfers Overall transfer level:  (unable)                  Ambulation/Gait Ambulation/Gait assistance:  (unable)              Stairs            Wheelchair Mobility    Modified Rankin (Stroke Patients Only)       Balance                                             Pertinent Vitals/Pain Pain Assessment: Faces Faces Pain Scale: Hurts little more Pain Location: knees with attepmts to assess ROM Pain Descriptors / Indicators: Grimacing Pain Intervention(s): Limited activity within patient's tolerance;Monitored during session;Repositioned    Home Living Family/patient expects to be discharged to:: Unsure (no family present to determine) Living Arrangements: Children               Additional Comments: pt. reports she lives with daughter Carol Page    Prior Function Level of Independence: Needs assistance   Gait / Transfers Assistance Needed: pt. unable to give reliable responses but believe she was not transferring to chair PTA  ADL's / Homemaking Assistance Needed: total assist        Hand Dominance        Extremity/Trunk Assessment   Upper Extremity Assessment: Defer to OT evaluation;Generalized weakness           Lower Extremity Assessment: Generalized weakness;RLE deficits/detail;LLE deficits/detail RLE Deficits / Details: grossly <3/5 active ROM in major planes; passive ROM yields WFL at ankle with limitations at  knee and hip due to body habitus LLE Deficits / Details: active ROM < 3/5 grossly with decreased strength L >R LE; passive ROM grossly WFL at ankle and limited at hip and knee due to body habitus and pain     Communication   Communication: Receptive difficulties;Expressive difficulties  Cognition Arousal/Alertness: Awake/alert Behavior During Therapy: Anxious Overall Cognitive Status: No family/caregiver present to determine baseline cognitive functioning       Memory: Decreased recall of precautions;Decreased short-term memory              General Comments General comments (skin integrity, edema, etc.): skin  appeared intact on buttocks and back with rolling attempt; foam sacral dressing intact.    Exercises        Assessment/Plan    PT Assessment Patient needs continued PT services  PT Diagnosis Generalized weakness;Altered mental status;Acute pain (inability to walk or transfer)   PT Problem List Decreased strength;Decreased range of motion;Decreased activity tolerance;Decreased mobility;Decreased cognition;Decreased safety awareness;Decreased knowledge of precautions;Impaired sensation  PT Treatment Interventions Functional mobility training;Therapeutic activities;Therapeutic exercise;Balance training;Patient/family education   PT Goals (Current goals can be found in the Care Plan section) Acute Rehab PT Goals Patient Stated Goal: pt. did not state goals, agreeable to PT with encouragement PT Goal Formulation: Patient unable to participate in goal setting Time For Goal Achievement: 08/31/15 Potential to Achieve Goals: Fair    Frequency Other (Comment) (trial of PT on 2x/ week basis to see if she can progress)   Barriers to discharge Other (comment) (TBD, pt. requiring +2 total assist for rolling) Will need to confirm with family what level of surpport is available for pt. in home enviornment.  She currently needs +2 total assist for rolling in bed.    Co-evaluation               End of Session   Activity Tolerance: Patient limited by pain;Patient tolerated treatment well Patient left: in bed;with call bell/phone within reach;with bed alarm set Nurse Communication: Mobility status (advised RN on need for lift equipment for OOB)         Time: [id number] PT Time Calculation (min) (ACUTE ONLY): 16 min   Charges:   PT Evaluation $PT Eval Moderate Complexity: 1 Procedure     PT G CodesLadona Ridgel 08/17/2015, 11:24 AM Gerlean Ren PT Acute Rehab Services [phone]

## 2015-08-18 LAB — CULTURE, BLOOD (ROUTINE X 2)
CULTURE: NO GROWTH
Culture: NO GROWTH

## 2015-08-18 LAB — GLUCOSE, CAPILLARY
GLUCOSE-CAPILLARY: 120 mg/dL — AB (ref 65–99)
GLUCOSE-CAPILLARY: 124 mg/dL — AB (ref 65–99)
GLUCOSE-CAPILLARY: 129 mg/dL — AB (ref 65–99)
GLUCOSE-CAPILLARY: 140 mg/dL — AB (ref 65–99)
Glucose-Capillary: 127 mg/dL — ABNORMAL HIGH (ref 65–99)
Glucose-Capillary: 112 mg/dL — ABNORMAL HIGH (ref 65–99)

## 2015-08-18 LAB — AMMONIA: Ammonia: 44 umol/L — ABNORMAL HIGH (ref 9–35)

## 2015-08-18 MED ORDER — METRONIDAZOLE 50 MG/ML ORAL SUSPENSION
500.0000 mg | Freq: Three times a day (TID) | ORAL | Status: DC
  Administered 2015-08-18 (×2): 500 mg via ORAL
  Filled 2015-08-18 (×5): qty 10

## 2015-08-18 MED ORDER — DOCUSATE SODIUM 50 MG/5ML PO LIQD
100.0000 mg | Freq: Two times a day (BID) | ORAL | Status: DC
  Administered 2015-08-19 (×3): 100 mg via ORAL
  Filled 2015-08-18 (×7): qty 10

## 2015-08-18 MED ORDER — METRONIDAZOLE 500 MG PO TABS: 500.0000 mg | ORAL_TABLET | Freq: Three times a day (TID) | ORAL | Status: DC

## 2015-08-18 MED ORDER — CIPROFLOXACIN HCL 500 MG PO TABS
500.0000 mg | ORAL_TABLET | Freq: Two times a day (BID) | ORAL | Status: DC
  Administered 2015-08-18: 500 mg via ORAL
  Filled 2015-08-18: qty 1

## 2015-08-18 MED ORDER — CIPROFLOXACIN 500 MG/5ML (10%) PO SUSR
500.0000 mg | Freq: Two times a day (BID) | ORAL | Status: DC
  Administered 2015-08-19: 500 mg via ORAL
  Filled 2015-08-18 (×4): qty 5

## 2015-08-18 MED ORDER — PHENYTOIN SODIUM EXTENDED 100 MG PO CAPS
100.0000 mg | ORAL_CAPSULE | Freq: Three times a day (TID) | ORAL | Status: DC
  Administered 2015-08-18 – 2015-08-19 (×5): 100 mg via ORAL
  Filled 2015-08-18 (×10): qty 1

## 2015-08-18 NOTE — Progress Notes (Signed)
Key Points: Use following P&T approved IV to PO antibiotic change policy.  Description contains the criteria that are approved Note: Policy Excludes:  Esophagectomy patientsPHARMACIST - PHYSICIAN COMMUNICATION DR:   [facility] CONCERNING: IV to Oral Route Change Policy  RECOMMENDATION: This patient is receiving phenytoin by the intravenous route.  Based on criteria approved by the Pharmacy and Therapeutics Committee, the intravenous medication(s) is/are being converted to the equivalent oral dose form(s).   DESCRIPTION: These criteria include:  The patient is eating (either orally or via tube) and/or has been taking other orally administered medications for a least 24 hours  The patient has no evidence of active gastrointestinal bleeding or impaired GI absorption (gastrectomy, short bowel, patient on TNA or NPO).  If you have questions about this conversion, please contact the Pharmacy Department  []   [phone] )  [facility] []   [phone] )  [facility] [x]   [phone] )  [facility] []   [phone] )  [facility] []   [phone] )  [place], [place], [facility] 08/18/2015 10:43 AM

## 2015-08-18 NOTE — Progress Notes (Signed)
[facility] PROGRESS NOTE  Carol Page [id number] DOB: 07-25-1935 DOA: 08/13/2015 PCP: Webb Silversmith, NP  Brief Summary  This is a 80 year old female patient with history of hypertension, seizure disorder, prediabetes, chronic urinary incontinence, dyslipidemia, severe obesity, prior CVA, anxiety and depression and history of chronic leg pain who was sent to the ER by her family due to altered mentation. At time of my assessment patient no family was at the bedside. Per ED provider and documentation in the EMR EMS reported that the patient's daughter stated that the patient was last seen normal at 9 PM last night on 1/23 and when the family came by to check her this morning she was less responsive and confused. It was documented she had been drowsy with "UTI type symptoms" based on family report. CBG was 154 at the scene. Because of significant altered mentation patient was to contribute to history portion of exam. Of note patient arrived with a completed DO NOT RESUSCITATE form in place.  In the ER, she was febrile to 102F and tachycardic.  CXR demonstrated no infiltrate and UA had only 0-5 WBC.  CT head was without acute abnormality.  She was started on broad spectrum antibiotics in the ER and narrowed to ceftriaxone after UA resulted.    Assessment/Plan  Sepsis due to colitis?  Initially, it was thought that she might have a urinary tract infection, however her urinalysis demonstrated only 0-5 white blood cells.  Switched to  vancomycin and Zosyn, vancomycin has been discontinued, patient is currently on day 5 of Zosyn and is improving, will switch to ciprofloxacin and Flagyl 5 days -  Repeat chest x-ray: Bilateral atelectasis versus infiltrate -  CT scan of abdomen and pelvis Large stool burden throughout the colon with wall thickening and stranding about the transverse and descending colon most consistent with colitis. Patient disimpacted during this admission and she continues to  have bowel movements Patient is eating, DC IV fluids - respiratory viral panel negative - PICC line placed 1/24 Initiated dysphagia 1 diet Continue MedSurg , pending SNF placement  Acute toxic encephalopathy-resolved with intermittent sundowning Likely secondary to infection, acute kidney injury, colitis CT head on 1/24 was negative Offered to do an MRI brain if continues, but patient has no gross focal deficits  No fever , doubt new infection  Check dilantin level   AKI / Metabolic acidemia, prerenal, resolving with IVF -Continue to hold thiazide diuretic and ACE inhibitor Cr improving 1.25>1.72> 1.13> 0.82 DC IV fluids  Hypokalemia Replete and check a magnesium level   Essential hypertension, with borderline hypertension -  Continue to hold antihypertensives   Seizure disorder, phenytoin level mildly elevated, which may be secondary to dehydration Dilantin level therapeutic 15.9 on admission -Continue Dilantin at current dose   Prediabetes,  -Continue CBG every 6  hours,-SSI,   Urinary incontinence - DC Foley catheter   HLD  -Preadmission statin on hold   Severe obesity (BMI >= 40)    History of CVA  -Preadmission Plavix and statin on hold  Constipation Continue MiraLAX, Colace    Diet:   Dysphagia 1 (puree);Nectar-thick liquid   Proph:  lovenox  Code Status: DNR Family Communication: patient, her daughter and her son-in-law Disposition Plan: Continue MedSurg, social work consultation for placement   Consultants:  none  Procedures:  CT head  CT abd/pelvis  Antibiotics: Anti-infectives    Start     Dose/Rate Route Frequency Ordered Stop   08/14/15 1700  cefTRIAXone (ROCEPHIN) 1 g in dextrose  5 % 50 mL IVPB  Status:  Discontinued     1 g 100 mL/hr over 30 Minutes Intravenous Every 24 hours 08/13/15 1628 08/14/15 1026   08/14/15 1200  vancomycin (VANCOCIN) 1,250 mg in sodium chloride 0.9 % 250 mL IVPB  Status:  Discontinued     1,250  mg 166.7 mL/hr over 90 Minutes Intravenous Every 24 hours 08/14/15 1146 08/16/15 0856   08/14/15 1200  piperacillin-tazobactam (ZOSYN) IVPB 3.375 g     3.375 g 12.5 mL/hr over 240 Minutes Intravenous Every 8 hours 08/14/15 1146     08/13/15 1700  cefTRIAXone (ROCEPHIN) 2 g in dextrose 5 % 50 mL IVPB     2 g 100 mL/hr over 30 Minutes Intravenous  Once 08/13/15 1614 08/13/15 1752   08/13/15 1300  vancomycin (VANCOCIN) IVPB 1000 mg/200 mL premix  Status:  Discontinued     1,000 mg 200 mL/hr over 60 Minutes Intravenous  Once 08/13/15 1230 08/13/15 1453   08/13/15 1215  piperacillin-tazobactam (ZOSYN) IVPB 3.375 g     3.375 g 100 mL/hr over 30 Minutes Intravenous  Once 08/13/15 1209 08/13/15 1243   08/13/15 1215  vancomycin (VANCOCIN) IVPB 1000 mg/200 mL premix     1,000 mg 200 mL/hr over 60 Minutes Intravenous  Once 08/13/15 1209 08/13/15 1315       HPI/Subjective: Patient's vitals are stable, confused and seeing things , son in [place]  by the bedside  And had several questions about her care and confusion  Objective: Filed Vitals:   08/17/15 1207 08/17/15 1524 08/17/15 1954 08/18/15 0419  BP: 139/91 131/40 128/47 131/43  Pulse: 86 88 91 96  Temp: 98.5 F (36.9 C) 98.4 F (36.9 C) 99.1 F (37.3 C) 98.7 F (37.1 C)  TempSrc: Oral Oral Oral Oral  Resp: 21 20 19 18   Height:      Weight:      SpO2: 98% 96% 95% 92%    Intake/Output Summary (Last 24 hours) at 08/18/15 1155 Last data filed at 08/18/15 0820  Gross per 24 hour  Intake    755 ml  Output    900 ml  Net   -145 ml   Filed Weights   08/15/15 0441 08/16/15 0500 08/17/15 0417  Weight: 111.5 kg (245 lb 13 oz) 113 kg (249 lb 1.9 oz) 113.7 kg (250 lb 10.6 oz)   Body mass index is 44.41 kg/(m^2).  Exam:   General:  No acute distress, adult female  HEENT:  NCAT, MMM, no nuchal rigidity  Cardiovascular:  RRR, nl S1, S2 no mrg, 2+ pulses, warm extremities  Respiratory:  Wheezing slightly bilaterally, no increased  WOB  Abdomen:   Hypoactive BS, moderately distended, and TTP diffusely, worse around umbilicus with some guarding, no rebound.    MSK:   Normal tone and bulk, 1+ edema of the left ankle and no right ankle edema (chronic)  Neuro:  Diffusely weak, slightly more pronounced on right side  Data Reviewed: Basic Metabolic Panel:  Recent Labs Lab 08/13/15 1100 08/14/15 1100 08/15/15 0520 08/16/15 0425 08/16/15 1229 08/17/15 0315  NA 136 138 137 140  --  141  K 4.6 4.5 3.7 3.3*  --  3.5  CL 100* 108 110 113*  --  110  CO2 19* 19* 20* 20*  --  22  GLUCOSE 176* 149* 131* 128*  --  147*  BUN 29* 33* 43* 32*  --  18  CREATININE 1.54* 1.25* 1.72* 1.13*  --  0.82  CALCIUM 9.2 7.6* 7.3* 7.7*  --  7.9*  MG  --   --   --   --  1.7  --    Liver Function Tests:  Recent Labs Lab 08/13/15 1100 08/16/15 0425 08/17/15 0315  AST 34 77* 49*  ALT 34 33 32  ALKPHOS 93 57 68  BILITOT 0.8 1.0 0.8  PROT 6.6 4.9* 5.2*  ALBUMIN 3.6 1.9* 1.8*    Recent Labs Lab 08/13/15 1100  LIPASE 29   No results for input(s): AMMONIA in the last 168 hours. CBC:  Recent Labs Lab 08/13/15 1100 08/14/15 1100 08/15/15 0520 08/16/15 0425 08/17/15 0315  WBC 16.0* 16.7* 14.2* 16.4* 15.5*  NEUTROABS 14.0*  --   --   --   --   HGB 13.7 11.0* 8.8* 9.1* 9.7*  HCT 41.6 33.0* 27.4* 27.7* 28.6*  MCV 102.2* 99.4 101.1* 100.0 98.6  PLT 217 187 150 161 162    Recent Results (from the past 240 hour(s))  Urine culture     Status: None   Collection Time: 08/13/15 11:02 AM  Result Value Ref Range Status   Specimen Description URINE, CATHETERIZED  Final   Special Requests Normal  Final   Culture NO GROWTH 1 DAY  Final   Report Status 08/14/2015 FINAL  Final  Blood culture (routine x 2)     Status: None (Preliminary result)   Collection Time: 08/13/15  1:57 PM  Result Value Ref Range Status   Specimen Description BLOOD RIGHT ANTECUBITAL  Final   Special Requests   Final    BOTTLES DRAWN AEROBIC AND  ANAEROBIC 5CC DRAWN AFTER ABX   Culture NO GROWTH 4 DAYS  Final   Report Status PENDING  Incomplete  Respiratory virus panel     Status: None   Collection Time: 08/13/15  4:59 PM  Result Value Ref Range Status   Source - RVPAN NASOPHARYNGEAL  Corrected   Respiratory Syncytial Virus A Negative Negative Final   Respiratory Syncytial Virus B Negative Negative Final   Influenza A Negative Negative Final   Influenza B Negative Negative Final   Parainfluenza 1 Negative Negative Final   Parainfluenza 2 Negative Negative Final   Parainfluenza 3 Negative Negative Final   Metapneumovirus Negative Negative Final   Rhinovirus Negative Negative Final   Adenovirus Negative Negative Final    Comment: (NOTE) Performed At: [facility] [address] [place], [place] [id number] Lindon Romp MD [id number]   MRSA PCR Screening     Status: None   Collection Time: 08/13/15  4:59 PM  Result Value Ref Range Status   MRSA by PCR NEGATIVE NEGATIVE Final    Comment:        The GeneXpert MRSA Assay (FDA approved for NASAL specimens only), is one component of a comprehensive MRSA colonization surveillance program. It is not intended to diagnose MRSA infection nor to guide or monitor treatment for MRSA infections.   Blood culture (routine x 2)     Status: None (Preliminary result)   Collection Time: 08/13/15  7:20 PM  Result Value Ref Range Status   Specimen Description BLOOD LEFT HAND  Final   Special Requests IN PEDIATRIC BOTTLE 2CC  Final   Culture NO GROWTH 4 DAYS  Final   Report Status PENDING  Incomplete  Culture, blood (single) w Reflex to ID Panel     Status: None (Preliminary result)   Collection Time: 08/14/15  1:39 PM  Result Value Ref Range Status   Specimen  Description BLOOD LEFT ANTECUBITAL  Final   Special Requests IN PEDIATRIC BOTTLE 3CC  Final   Culture NO GROWTH 3 DAYS  Final   Report Status PENDING  Incomplete     Studies: No results found.  Scheduled  Meds: . docusate sodium  100 mg Oral BID  . enoxaparin (LOVENOX) injection  50 mg Subcutaneous Q24H  . insulin aspart  0-9 Units Subcutaneous 6 times per day  . phenytoin  100 mg Oral TID  . piperacillin-tazobactam (ZOSYN)  IV  3.375 g Intravenous Q8H  . polyethylene glycol  17 g Oral BID  . senna-docusate  1 tablet Oral BID  . sodium chloride flush  10-40 mL Intracatheter Q12H  . sodium chloride flush  3 mL Intravenous Q12H   Continuous Infusions: . 0.9 % NaCl with KCl 20 mEq / L 75 mL/hr at 08/17/15 [id number]    Principal Problem:   Sepsis due to urinary tract infection ([place]) Active Problems:   Essential hypertension   HLD (hyperlipidemia)   Severe obesity (BMI >= 40) ([facility])   Seizure disorder ([facility])   Urinary incontinence   History of CVA (cerebrovascular accident)   AKI (acute kidney injury) ([place])   Metabolic acidemia   Sepsis ([place])   Abdominal distension   Dyspnea   Encounter for palliative care   Goals of care, counseling/discussion   Abdominal pain    Time spent: 30 min    [facility] Pager [phone]. If 7PM-7AM, please contact night-coverage at [email or web address], password [facility] 08/18/2015, 11:55 AM  LOS: 5 days

## 2015-08-19 ENCOUNTER — Inpatient Hospital Stay (HOSPITAL_COMMUNITY): Payer: Medicare Other

## 2015-08-19 LAB — COMPREHENSIVE METABOLIC PANEL
ALBUMIN: 1.7 g/dL — AB (ref 3.5–5.0)
ALK PHOS: 75 U/L (ref 38–126)
ALT: 28 U/L (ref 14–54)
AST: 33 U/L (ref 15–41)
Anion gap: 9 (ref 5–15)
BILIRUBIN TOTAL: 0.7 mg/dL (ref 0.3–1.2)
BUN: 10 mg/dL (ref 6–20)
CALCIUM: 7.6 mg/dL — AB (ref 8.9–10.3)
CO2: 25 mmol/L (ref 22–32)
CREATININE: 0.64 mg/dL (ref 0.44–1.00)
Chloride: 107 mmol/L (ref 101–111)
GFR calc Af Amer: >60 mL/min (ref >60)
GFR calc non Af Amer: >60 mL/min (ref >60)
GLUCOSE: 119 mg/dL — AB (ref 65–99)
Potassium: 3.6 mmol/L (ref 3.5–5.1)
Sodium: 141 mmol/L (ref 135–145)
TOTAL PROTEIN: 4.5 g/dL — AB (ref 6.5–8.1)

## 2015-08-19 LAB — GLUCOSE, CAPILLARY
GLUCOSE-CAPILLARY: 116 mg/dL — AB (ref 65–99)
Glucose-Capillary: 117 mg/dL — ABNORMAL HIGH (ref 65–99)
Glucose-Capillary: 119 mg/dL — ABNORMAL HIGH (ref 65–99)
Glucose-Capillary: 129 mg/dL — ABNORMAL HIGH (ref 65–99)
Glucose-Capillary: 137 mg/dL — ABNORMAL HIGH (ref 65–99)
Glucose-Capillary: 151 mg/dL — ABNORMAL HIGH (ref 65–99)

## 2015-08-19 LAB — DIFFERENTIAL
Basophils Absolute: 0 10*3/uL (ref 0.0–0.1)
Basophils Relative: 0 %
EOS ABS: 0.1 10*3/uL (ref 0.0–0.7)
Eosinophils Relative: 1 %
LYMPHS ABS: 1.4 10*3/uL (ref 0.7–4.0)
Lymphocytes Relative: 9 %
MONO ABS: 1.4 10*3/uL — AB (ref 0.1–1.0)
MONOS PCT: 8 %
Neutro Abs: 13.5 10*3/uL — ABNORMAL HIGH (ref 1.7–7.7)
Neutrophils Relative %: 82 %

## 2015-08-19 LAB — CBC
HEMATOCRIT: 28.8 % — AB (ref 36.0–46.0)
HEMOGLOBIN: 9.7 g/dL — AB (ref 12.0–15.0)
MCH: 33.4 pg (ref 26.0–34.0)
MCHC: 33.7 g/dL (ref 30.0–36.0)
MCV: 99.3 fL (ref 78.0–100.0)
Platelets: 168 10*3/uL (ref 150–400)
RBC: 2.9 MIL/uL — ABNORMAL LOW (ref 3.87–5.11)
RDW: 13.5 % (ref 11.5–15.5)
WBC: 16.7 10*3/uL — AB (ref 4.0–10.5)

## 2015-08-19 LAB — CULTURE, BLOOD (SINGLE): Culture: NO GROWTH

## 2015-08-19 LAB — PHENYTOIN LEVEL, TOTAL: Phenytoin Lvl: 14 ug/mL (ref 10.0–20.0)

## 2015-08-19 MED ORDER — LACTULOSE 10 GM/15ML PO SOLN
10.0000 g | Freq: Two times a day (BID) | ORAL | Status: DC
  Administered 2015-08-19 – 2015-08-22 (×5): 10 g via ORAL
  Filled 2015-08-19 (×6): qty 15

## 2015-08-19 MED ORDER — STARCH (THICKENING) PO POWD
ORAL | Status: DC | PRN
  Filled 2015-08-19: qty 227

## 2015-08-19 MED ORDER — PIPERACILLIN-TAZOBACTAM 3.375 G IVPB
3.3750 g | Freq: Three times a day (TID) | INTRAVENOUS | Status: DC
  Administered 2015-08-19 – 2015-08-21 (×6): 3.375 g via INTRAVENOUS
  Filled 2015-08-19 (×10): qty 50

## 2015-08-19 NOTE — Progress Notes (Signed)
ANTIBIOTIC CONSULT NOTE - INITIAL  Pharmacy Consult for zosyn Indication: intra-abd infection  Allergies  Allergen Reactions  . Codeine Nausea And Vomiting  . Ether Nausea And Vomiting    Patient Measurements: Height: 5\' 3"  (160 cm) Weight: 250 lb 10.6 oz (113.7 kg) IBW/kg (Calculated) : 52.4   Vital Signs: Temp: 99.6 F (37.6 C) (01/30 0426) Temp Source: Oral (01/30 0426) BP: 130/81 mmHg (01/30 0426) Pulse Rate: 87 (01/30 0426) Intake/Output from previous day: 01/29 0701 - 01/30 0700 In: 120 [P.O.:120] Out: 1500 [Urine:1500] Intake/Output from this shift: Total I/O In: 120 [P.O.:120] Out: -   Labs:  Recent Labs  08/17/15 0315 08/19/15 0426  WBC 15.5* 16.7*  HGB 9.7* 9.7*  PLT 162 168  CREATININE 0.82 0.64   Estimated Creatinine Clearance: 71.5 mL/min (by C-G formula based on Cr of 0.64). No results for input(s): VANCOTROUGH, VANCOPEAK, VANCORANDOM, GENTTROUGH, GENTPEAK, GENTRANDOM, TOBRATROUGH, TOBRAPEAK, TOBRARND, AMIKACINPEAK, AMIKACINTROU, AMIKACIN in the last 72 hours.   Microbiology: Recent Results (from the past 720 hour(s))  Urine culture     Status: None   Collection Time: 08/13/15 11:02 AM  Result Value Ref Range Status   Specimen Description URINE, CATHETERIZED  Final   Special Requests Normal  Final   Culture NO GROWTH 1 DAY  Final   Report Status 08/14/2015 FINAL  Final  Blood culture (routine x 2)     Status: None   Collection Time: 08/13/15  1:57 PM  Result Value Ref Range Status   Specimen Description BLOOD RIGHT ANTECUBITAL  Final   Special Requests   Final    BOTTLES DRAWN AEROBIC AND ANAEROBIC 5CC DRAWN AFTER ABX   Culture NO GROWTH 5 DAYS  Final   Report Status 08/18/2015 FINAL  Final  Respiratory virus panel     Status: None   Collection Time: 08/13/15  4:59 PM  Result Value Ref Range Status   Source - RVPAN NASOPHARYNGEAL  Corrected   Respiratory Syncytial Virus A Negative Negative Final   Respiratory Syncytial Virus B Negative  Negative Final   Influenza A Negative Negative Final   Influenza B Negative Negative Final   Parainfluenza 1 Negative Negative Final   Parainfluenza 2 Negative Negative Final   Parainfluenza 3 Negative Negative Final   Metapneumovirus Negative Negative Final   Rhinovirus Negative Negative Final   Adenovirus Negative Negative Final    Comment: (NOTE) Performed At: [facility] [place], [place] [id number] Lindon Romp MD [id number]   MRSA PCR Screening     Status: None   Collection Time: 08/13/15  4:59 PM  Result Value Ref Range Status   MRSA by PCR NEGATIVE NEGATIVE Final    Comment:        The GeneXpert MRSA Assay (FDA approved for NASAL specimens only), is one component of a comprehensive MRSA colonization surveillance program. It is not intended to diagnose MRSA infection nor to guide or monitor treatment for MRSA infections.   Blood culture (routine x 2)     Status: None   Collection Time: 08/13/15  7:20 PM  Result Value Ref Range Status   Specimen Description BLOOD LEFT HAND  Final   Special Requests IN PEDIATRIC BOTTLE 2CC  Final   Culture NO GROWTH 5 DAYS  Final   Report Status 08/18/2015 FINAL  Final  Culture, blood (single) w Reflex to ID Panel     Status: None (Preliminary result)   Collection Time: 08/14/15  1:39 PM  Result Value Ref Range  Status   Specimen Description BLOOD LEFT ANTECUBITAL  Final   Special Requests IN PEDIATRIC BOTTLE 3CC  Final   Culture NO GROWTH 4 DAYS  Final   Report Status PENDING  Incomplete    Medical History: Past Medical History  Diagnosis Date  . Stroke ([place])   . Hypertension   . Hyperlipidemia   . Arthritis   . Depression   . Ulcer   . History of blood transfusion   . Urinary incontinence   . Allergy   . Skin cancer of nose   . Cellulitis of left leg   . Pre-diabetes     Assessment: 80 yo F to resume zosyn for sepsis possibly due to colitis.  Wt 114 kg,  WBC 16.7, creat 0.64  1/25 CT  abd: lesion on liver, likely cyst, colitis 1/30 CT abd: persistent changed suggest inflammation of traverse colon 1/25 CXR incr bibasilar atelecasis 1/30 CXR atelectasis  Cipro 1/29>>1/30 Flagyl 1/29>>1/30 Zosyn 1/24  >>1/26  1/30>> vanc 1/24>>1/26 CTX x 1 dose 1/24  1/24 blood cx:ngF 1/24 urine cx:ngF 1/25 BC>>ngtd 1/24 MRSA PCR neg   Goal of Therapy:  Eradicate infection   Plan:  Zosyn 3.375 gm IV q8h, infuse each dose over 4 hours Pharmacy will sign off Thanks  Eudelia Bunch, Pharm.D. [id number] 08/19/2015 11:06 AM

## 2015-08-19 NOTE — Progress Notes (Addendum)
[facility] PROGRESS NOTE  Carol Page [id number] DOB: [birth year] DOA: 08/13/2015 PCP: Webb Silversmith, NP  Brief Summary  This is a 80 year old female patient with history of hypertension, seizure disorder, prediabetes, chronic urinary incontinence, dyslipidemia, severe obesity, prior CVA, anxiety and depression and history of chronic leg pain who was sent to the ER by her family due to altered mentation. At time of my assessment patient no family was at the bedside. Per ED provider and documentation in the EMR EMS reported that the patient's daughter stated that the patient was last seen normal at 9 PM last night on 1/23 and when the family came by to check her this morning she was less responsive and confused. It was documented she had been drowsy with "UTI type symptoms" based on family report. CBG was 154 at the scene. Because of significant altered mentation patient was to contribute to history portion of exam. Of note patient arrived with a completed DO NOT RESUSCITATE form in place.  In the ER, she was febrile to 102F and tachycardic.  CXR demonstrated no infiltrate and UA had only 0-5 WBC.  CT head was without acute abnormality.   UA was negative. CT abdomen and pelvis showed colitis  Subjective-patient appears to be somewhat confused but hemodynamically stable, still complaining of some abdominal pain but no nausea vomiting  Assessment/Plan  Sepsis due to colitis?  Initially, it was thought that she might have a urinary tract infection, however her urinalysis demonstrated only 0-5 white blood cells.  Started on  vancomycin and Zosyn, vancomycin has been discontinued, patient has been on Zosyn since 1/24, however the patient continues to have elevated white count, had 3 loose bowel movements yesterday No pneumonia on chest x-ray -  CT scan of abdomen and pelvis Large stool burden throughout the colon with wall thickening and stranding about the transverse and descending colon most  consistent with colitis. Patient was also found to be impacted and was manually disimpacted during this admission,  abdomen continues to be distended  , KUB on 1/30 showed persistent inflammation of the transverse colon In the setting of worsening white blood cell count, patient has been switched back to IV Zosyn  1/ 30  and Cipro and Flagyl has been discontinued, also obtain GI pathogen panel - respiratory viral panel negative - PICC line placed 1/24  Repeat chest x-ray due to persistent white blood cell count If no improvement patient may need repeat abdominal CT scan   Acute toxic encephalopathy-resolved with intermittent sundowning Likely secondary to infection, acute kidney injury, colitis CT head on 1/24 was negative Offered to do an MRI brain if continues, but patient has no gross focal deficits  No fever , doubt new infection , ammonia level XLIV Dilantin level supratherapeutic at 32 after correction for albumin  AKI / Metabolic acidemia, prerenal, resolving with IVF -Continue to hold thiazide diuretic and ACE inhibitor Cr improving 1.25>1.72> 1.13> 0.82> 0.6 DC IV fluids  Hypokalemia Replete and check a magnesium level   Essential hypertension, with borderline hypertension -  Continue to hold antihypertensives   Seizure disorder, phenytoin level mildly elevated, which may be secondary to dehydration Dilantin level therapeutic 15.9 on admission -Continue Dilantin at current dose   Prediabetes,  -Continue CBG every 6  hours,-SSI,   Urinary incontinence - DC Foley catheter   HLD  -Preadmission statin on hold   Severe obesity (BMI >= 40)    History of CVA  -Preadmission Plavix and statin on hold  Constipation Continue  MiraLAX, Colace    Diet:   Dysphagia 1 (puree);Nectar-thick liquid   Proph:  lovenox  Code Status: DNR Family Communication: patient, her daughter and her son-in-law Disposition Plan: Continue MedSurg, social work consultation for  placement, not stable for discharge today   Consultants:  none  Procedures:  CT head  CT abd/pelvis  Antibiotics: Anti-infectives    Start     Dose/Rate Route Frequency Ordered Stop   08/18/15 1600  metroNIDAZOLE (FLAGYL) 50 mg/ml oral suspension 500 mg     500 mg Oral 3 times daily 08/18/15 1240     08/18/15 1400  metroNIDAZOLE (FLAGYL) tablet 500 mg  Status:  Discontinued     500 mg Oral 3 times per day 08/18/15 1158 08/18/15 1241   08/18/15 1400  ciprofloxacin (CIPRO) 500 MG/5ML (10%) suspension 500 mg     500 mg Oral 2 times daily 08/18/15 1240     08/18/15 1200  ciprofloxacin (CIPRO) tablet 500 mg  Status:  Discontinued     500 mg Oral 2 times daily 08/18/15 1158 08/18/15 1241   08/14/15 1700  cefTRIAXone (ROCEPHIN) 1 g in dextrose 5 % 50 mL IVPB  Status:  Discontinued     1 g 100 mL/hr over 30 Minutes Intravenous Every 24 hours 08/13/15 1628 08/14/15 1026   08/14/15 1200  vancomycin (VANCOCIN) 1,250 mg in sodium chloride 0.9 % 250 mL IVPB  Status:  Discontinued     1,250 mg 166.7 mL/hr over 90 Minutes Intravenous Every 24 hours 08/14/15 1146 08/16/15 0856   08/14/15 1200  piperacillin-tazobactam (ZOSYN) IVPB 3.375 g  Status:  Discontinued     3.375 g 12.5 mL/hr over 240 Minutes Intravenous Every 8 hours 08/14/15 1146 08/18/15 1158   08/13/15 1700  cefTRIAXone (ROCEPHIN) 2 g in dextrose 5 % 50 mL IVPB     2 g 100 mL/hr over 30 Minutes Intravenous  Once 08/13/15 1614 08/13/15 1752   08/13/15 1300  vancomycin (VANCOCIN) IVPB 1000 mg/200 mL premix  Status:  Discontinued     1,000 mg 200 mL/hr over 60 Minutes Intravenous  Once 08/13/15 1230 08/13/15 1453   08/13/15 1215  piperacillin-tazobactam (ZOSYN) IVPB 3.375 g     3.375 g 100 mL/hr over 30 Minutes Intravenous  Once 08/13/15 1209 08/13/15 1243   08/13/15 1215  vancomycin (VANCOCIN) IVPB 1000 mg/200 mL premix     1,000 mg 200 mL/hr over 60 Minutes Intravenous  Once 08/13/15 1209 08/13/15 1315          Objective: Filed Vitals:   08/18/15 0419 08/18/15 1414 08/18/15 2030 08/19/15 0426  BP: 131/43 109/39 135/72 130/81  Pulse: 96 102 92 87  Temp: 98.7 F (37.1 C) 98.3 F (36.8 C) 99.4 F (37.4 C) 99.6 F (37.6 C)  TempSrc: Oral Oral Oral Oral  Resp: 18 18 18 18   Height:      Weight:      SpO2: 92% 94% 94% 94%    Intake/Output Summary (Last 24 hours) at 08/19/15 1054 Last data filed at 08/19/15 0828  Gross per 24 hour  Intake    120 ml  Output   1500 ml  Net  -1380 ml   Filed Weights   08/15/15 0441 08/16/15 0500 08/17/15 0417  Weight: 111.5 kg (245 lb 13 oz) 113 kg (249 lb 1.9 oz) 113.7 kg (250 lb 10.6 oz)   Body mass index is 44.41 kg/(m^2).  Exam:   General:  No acute distress, adult female  HEENT:  NCAT, MMM,  no nuchal rigidity  Cardiovascular:  RRR, nl S1, S2 no mrg, 2+ pulses, warm extremities  Respiratory:  Wheezing slightly bilaterally, no increased WOB  Abdomen:   Hypoactive BS, moderately distended, and TTP diffusely, worse around umbilicus with some guarding, no rebound.    MSK:   Normal tone and bulk, 1+ edema of the left ankle and no right ankle edema (chronic)  Neuro:  Diffusely weak, slightly more pronounced on right side  Data Reviewed: Basic Metabolic Panel:  Recent Labs Lab 08/14/15 1100 08/15/15 0520 08/16/15 0425 08/16/15 1229 08/17/15 0315 08/19/15 0426  NA 138 137 140  --  141 141  K 4.5 3.7 3.3*  --  3.5 3.6  CL 108 110 113*  --  110 107  CO2 19* 20* 20*  --  22 25  GLUCOSE 149* 131* 128*  --  147* 119*  BUN 33* 43* 32*  --  18 10  CREATININE 1.25* 1.72* 1.13*  --  0.82 0.64  CALCIUM 7.6* 7.3* 7.7*  --  7.9* 7.6*  MG  --   --   --  1.7  --   --    Liver Function Tests:  Recent Labs Lab 08/13/15 1100 08/16/15 0425 08/17/15 0315 08/19/15 0426  AST 34 77* 49* 33  ALT 34 33 32 28  ALKPHOS 93 57 68 75  BILITOT 0.8 1.0 0.8 0.7  PROT 6.6 4.9* 5.2* 4.5*  ALBUMIN 3.6 1.9* 1.8* 1.7*    Recent Labs Lab 08/13/15 1100   LIPASE 29    Recent Labs Lab 08/18/15 1400  AMMONIA 44*   CBC:  Recent Labs Lab 08/13/15 1100 08/14/15 1100 08/15/15 0520 08/16/15 0425 08/17/15 0315 08/19/15 0426  WBC 16.0* 16.7* 14.2* 16.4* 15.5* 16.7*  NEUTROABS 14.0*  --   --   --   --   --   HGB 13.7 11.0* 8.8* 9.1* 9.7* 9.7*  HCT 41.6 33.0* 27.4* 27.7* 28.6* 28.8*  MCV 102.2* 99.4 101.1* 100.0 98.6 99.3  PLT 217 187 150 161 162 168    Recent Results (from the past 240 hour(s))  Urine culture     Status: None   Collection Time: 08/13/15 11:02 AM  Result Value Ref Range Status   Specimen Description URINE, CATHETERIZED  Final   Special Requests Normal  Final   Culture NO GROWTH 1 DAY  Final   Report Status 08/14/2015 FINAL  Final  Blood culture (routine x 2)     Status: None   Collection Time: 08/13/15  1:57 PM  Result Value Ref Range Status   Specimen Description BLOOD RIGHT ANTECUBITAL  Final   Special Requests   Final    BOTTLES DRAWN AEROBIC AND ANAEROBIC 5CC DRAWN AFTER ABX   Culture NO GROWTH 5 DAYS  Final   Report Status 08/18/2015 FINAL  Final  Respiratory virus panel     Status: None   Collection Time: 08/13/15  4:59 PM  Result Value Ref Range Status   Source - RVPAN NASOPHARYNGEAL  Corrected   Respiratory Syncytial Virus A Negative Negative Final   Respiratory Syncytial Virus B Negative Negative Final   Influenza A Negative Negative Final   Influenza B Negative Negative Final   Parainfluenza 1 Negative Negative Final   Parainfluenza 2 Negative Negative Final   Parainfluenza 3 Negative Negative Final   Metapneumovirus Negative Negative Final   Rhinovirus Negative Negative Final   Adenovirus Negative Negative Final    Comment: (NOTE) Performed At: [facility] [address]  [address] [place], [place] [id number] Lindon Romp MD [id number]   MRSA PCR Screening     Status: None   Collection Time: 08/13/15  4:59 PM  Result Value Ref Range Status   MRSA by PCR NEGATIVE NEGATIVE Final     Comment:        The GeneXpert MRSA Assay (FDA approved for NASAL specimens only), is one component of a comprehensive MRSA colonization surveillance program. It is not intended to diagnose MRSA infection nor to guide or monitor treatment for MRSA infections.   Blood culture (routine x 2)     Status: None   Collection Time: 08/13/15  7:20 PM  Result Value Ref Range Status   Specimen Description BLOOD LEFT HAND  Final   Special Requests IN PEDIATRIC BOTTLE 2CC  Final   Culture NO GROWTH 5 DAYS  Final   Report Status 08/18/2015 FINAL  Final  Culture, blood (single) w Reflex to ID Panel     Status: None (Preliminary result)   Collection Time: 08/14/15  1:39 PM  Result Value Ref Range Status   Specimen Description BLOOD LEFT ANTECUBITAL  Final   Special Requests IN PEDIATRIC BOTTLE 3CC  Final   Culture NO GROWTH 4 DAYS  Final   Report Status PENDING  Incomplete     Studies: No results found.  Scheduled Meds: . ciprofloxacin  500 mg Oral BID  . docusate  100 mg Oral BID  . enoxaparin (LOVENOX) injection  50 mg Subcutaneous Q24H  . insulin aspart  0-9 Units Subcutaneous 6 times per day  . lactulose  10 g Oral BID  . metroNIDAZOLE  500 mg Oral TID  . phenytoin  100 mg Oral TID  . polyethylene glycol  17 g Oral BID  . senna-docusate  1 tablet Oral BID  . sodium chloride flush  10-40 mL Intracatheter Q12H  . sodium chloride flush  3 mL Intravenous Q12H   Continuous Infusions:    Principal Problem:   Sepsis due to urinary tract infection (HCC) Active Problems:   Essential hypertension   HLD (hyperlipidemia)   Severe obesity (BMI >= 40) (HCC)   Seizure disorder (HCC)   Urinary incontinence   History of CVA (cerebrovascular accident)   AKI (acute kidney injury) ([place])   Metabolic acidemia   Sepsis ([place])   Abdominal distension   Dyspnea   Encounter for palliative care   Goals of care, counseling/discussion   Abdominal pain    Time spent: 30  min    [facility] Pager [phone]. If 7PM-7AM, please contact night-coverage at [email or web address], password [facility] 08/19/2015, 10:54 AM  LOS: 6 days

## 2015-08-19 NOTE — Care Management Important Message (Signed)
Important Message  Patient Details  Name: Carol Page MRN: [id number] Date of Birth: 04/26/1936   Medicare Important Message Given:  Yes    [name] P Annelle Behrendt 08/19/2015, 3:07 PM

## 2015-08-19 NOTE — NC FL2 (Signed)
[place] MEDICAID FL2 LEVEL OF CARE SCREENING TOOL     IDENTIFICATION  Patient Name: Carol Page Birthdate: 02-Jul-1936 Sex: female Admission Date (Current Location): 08/13/2015  [facility] [place] Number:  Herbalist and Address:  [facility] [place], [place] [address] [place], [place] [address]      Provider Number: [id number]  Attending Physician Name and Address:  Reyne Dumas, MD  Relative Name and Phone Number:       Current Level of Care: Hospital Recommended Level of Care: [place] Prior Approval Number:    Date Approved/Denied:   PASRR Number: [id number]  Discharge Plan: SNF    Current Diagnoses: Patient Active Problem List   Diagnosis Date Noted  . Abdominal pain   . Abdominal distension   . Dyspnea   . Encounter for palliative care   . Goals of care, counseling/discussion   . Sepsis due to urinary tract infection ([place]) 08/13/2015  . AKI (acute kidney injury) ([place]) 08/13/2015  . Metabolic acidemia [id number]  . Sepsis ([place]) 08/13/2015  . Anxiety and depression 10/06/2013  . Urinary incontinence 10/06/2013  . Chronic leg pain 10/06/2013  . History of CVA (cerebrovascular accident) 10/06/2013  . Essential hypertension 11/09/2012  . HLD (hyperlipidemia) 11/09/2012  . Severe obesity (BMI >= 40) ([place]) 11/09/2012  . Pre-diabetes 11/09/2012  . Seizure disorder ([place]) 11/09/2012    Orientation RESPIRATION BLADDER Height & Weight     Self  Normal Indwelling catheter Terence Lux Non-Latex Catheter) Weight: 250 lb 10.6 oz (113.7 kg) Height:  5\' 3"  (160 cm)  BEHAVIORAL SYMPTOMS/MOOD NEUROLOGICAL BOWEL NUTRITION STATUS    Convulsions/Seizures Incontinent Diet (Dysphagia 1 / Nectar Thick)  AMBULATORY STATUS COMMUNICATION OF NEEDS Skin   Extensive Assist Verbally Normal                       Personal Care Assistance Level of Assistance  Bathing, Feeding, Dressing Bathing Assistance: Maximum  assistance Feeding assistance: Limited assistance Dressing Assistance: Maximum assistance     Functional Limitations Info  Sight, Hearing, Speech Sight Info: Adequate Hearing Info: Adequate Speech Info: Adequate    SPECIAL CARE FACTORS FREQUENCY  PT (By licensed PT), OT (By licensed OT)     PT Frequency: 3 OT Frequency: 3            Contractures Contractures Info: Not present    Additional Factors Info  Code Status, Allergies, Insulin Sliding Scale Code Status Info: DNR Allergies Info: Codeine, Ether   Insulin Sliding Scale Info: Novolog 6 times per day       Current Medications (08/19/2015):  This is the current hospital active medication list Current Facility-Administered Medications  Medication Dose Route Frequency Provider Last Rate Last Dose  . acetaminophen (TYLENOL) tablet 650 mg  650 mg Oral Q6H PRN Samella Parr, NP       Or  . acetaminophen (TYLENOL) suppository 650 mg  650 mg Rectal Q6H PRN Samella Parr, NP   650 mg at 08/15/15 1644  . docusate (COLACE) 50 MG/5ML liquid 100 mg  100 mg Oral BID Reyne Dumas, MD   100 mg at 08/19/15 0832  . enoxaparin (LOVENOX) injection 50 mg  50 mg Subcutaneous Q24H Janece Canterbury, MD   50 mg at 08/19/15 1519  . food thickener (THICK IT) powder   Oral PRN Reyne Dumas, MD      . insulin aspart (novoLOG) injection 0-9 Units  0-9 Units Subcutaneous 6 times per  day Samella Parr, NP   1 Units at 08/19/15 1518  . iohexol (OMNIPAQUE) 300 MG/ML solution 25 mL  25 mL Oral Once PRN Janece Canterbury, MD      . iohexol (OMNIPAQUE) 300 MG/ML solution 25 mL  25 mL Oral Once PRN Janece Canterbury, MD      . lactulose (CHRONULAC) 10 GM/15ML solution 10 g  10 g Oral BID Reyne Dumas, MD   10 g at 08/19/15 1000  . ondansetron (ZOFRAN) tablet 4 mg  4 mg Oral Q6H PRN Samella Parr, NP       Or  . ondansetron [facility]) injection 4 mg  4 mg Intravenous Q6H PRN Samella Parr, NP      . phenytoin (DILANTIN) ER capsule 100 mg  100 mg Oral  TID Reyne Dumas, MD   100 mg at 08/19/15 1519  . piperacillin-tazobactam (ZOSYN) IVPB 3.375 g  3.375 g Intravenous Q8H Eudelia Bunch, RPH   3.375 g at 08/19/15 1518  . polyethylene glycol (MIRALAX / GLYCOLAX) packet 17 g  17 g Oral BID Reyne Dumas, MD   17 g at 08/19/15 [id number]  . senna-docusate (Senokot-S) tablet 1 tablet  1 tablet Oral BID Reyne Dumas, MD   1 tablet at 08/19/15 1000  . sodium chloride 0.9 % bolus 1,000 mL  1,000 mL Intravenous Q1H PRN Janece Canterbury, MD      . sodium chloride flush (NS) 0.9 % injection 10-40 mL  10-40 mL Intracatheter Q12H Samella Parr, NP   10 mL at 08/19/15 1000  . sodium chloride flush (NS) 0.9 % injection 10-40 mL  10-40 mL Intracatheter PRN Samella Parr, NP   10 mL at 08/19/15 0426  . sodium chloride flush (NS) 0.9 % injection 3 mL  3 mL Intravenous Q12H Samella Parr, NP   3 mL at 08/19/15 1000     Discharge Medications: Please see discharge summary for a list of discharge medications.  Relevant Imaging Results:  Relevant Lab Results:   Additional Information SSN [id number]  Barbette Or, [place]

## 2015-08-20 LAB — COMPREHENSIVE METABOLIC PANEL
ALK PHOS: 95 U/L (ref 38–126)
ALT: 31 U/L (ref 14–54)
ANION GAP: 8 (ref 5–15)
AST: 36 U/L (ref 15–41)
Albumin: 1.8 g/dL — ABNORMAL LOW (ref 3.5–5.0)
BILIRUBIN TOTAL: 0.7 mg/dL (ref 0.3–1.2)
BUN: 9 mg/dL (ref 6–20)
CALCIUM: 7.6 mg/dL — AB (ref 8.9–10.3)
CO2: 27 mmol/L (ref 22–32)
Chloride: 105 mmol/L (ref 101–111)
Creatinine, Ser: 0.66 mg/dL (ref 0.44–1.00)
Glucose, Bld: 122 mg/dL — ABNORMAL HIGH (ref 65–99)
Potassium: 3.6 mmol/L (ref 3.5–5.1)
SODIUM: 140 mmol/L (ref 135–145)
TOTAL PROTEIN: 5 g/dL — AB (ref 6.5–8.1)

## 2015-08-20 LAB — GLUCOSE, CAPILLARY
GLUCOSE-CAPILLARY: 108 mg/dL — AB (ref 65–99)
GLUCOSE-CAPILLARY: 133 mg/dL — AB (ref 65–99)
GLUCOSE-CAPILLARY: 150 mg/dL — AB (ref 65–99)
GLUCOSE-CAPILLARY: 188 mg/dL — AB (ref 65–99)
Glucose-Capillary: 144 mg/dL — ABNORMAL HIGH (ref 65–99)
Glucose-Capillary: 137 mg/dL — ABNORMAL HIGH (ref 65–99)

## 2015-08-20 LAB — GASTROINTESTINAL PANEL BY PCR, STOOL (REPLACES STOOL CULTURE)
ADENOVIRUS F40/41: NOT DETECTED
ASTROVIRUS: NOT DETECTED
CAMPYLOBACTER SPECIES: NOT DETECTED
CYCLOSPORA CAYETANENSIS: NOT DETECTED
Cryptosporidium: NOT DETECTED
E. COLI O157: NOT DETECTED
ENTAMOEBA HISTOLYTICA: NOT DETECTED
ENTEROPATHOGENIC E COLI (EPEC): NOT DETECTED
ENTEROTOXIGENIC E COLI (ETEC): NOT DETECTED
Enteroaggregative E coli (EAEC): NOT DETECTED
GIARDIA LAMBLIA: NOT DETECTED
Norovirus GI/GII: NOT DETECTED
Plesimonas shigelloides: NOT DETECTED
Rotavirus A: NOT DETECTED
SAPOVIRUS (I, II, IV, AND V): NOT DETECTED
SHIGA LIKE TOXIN PRODUCING E COLI (STEC): NOT DETECTED
SHIGELLA/ENTEROINVASIVE E COLI (EIEC): NOT DETECTED
Salmonella species: NOT DETECTED
VIBRIO CHOLERAE: NOT DETECTED
VIBRIO SPECIES: NOT DETECTED
Yersinia enterocolitica: NOT DETECTED

## 2015-08-20 LAB — CBC WITH DIFFERENTIAL/PLATELET
BASOS ABS: 0 10*3/uL (ref 0.0–0.1)
Basophils Relative: 0 %
Eosinophils Absolute: 0.1 10*3/uL (ref 0.0–0.7)
Eosinophils Relative: 1 %
HCT: 31.6 % — ABNORMAL LOW (ref 36.0–46.0)
HEMOGLOBIN: 10.6 g/dL — AB (ref 12.0–15.0)
LYMPHS ABS: 1.1 10*3/uL (ref 0.7–4.0)
Lymphocytes Relative: 8 %
MCH: 33.2 pg (ref 26.0–34.0)
MCHC: 33.5 g/dL (ref 30.0–36.0)
MCV: 99.1 fL (ref 78.0–100.0)
MONOS PCT: 7 %
Monocytes Absolute: 1 10*3/uL (ref 0.1–1.0)
NEUTROS ABS: 11.5 10*3/uL — AB (ref 1.7–7.7)
Neutrophils Relative %: 84 %
Platelets: 185 10*3/uL (ref 150–400)
RBC: 3.19 MIL/uL — ABNORMAL LOW (ref 3.87–5.11)
RDW: 13.6 % (ref 11.5–15.5)
WBC: 13.7 10*3/uL — ABNORMAL HIGH (ref 4.0–10.5)

## 2015-08-20 MED ORDER — PHENYTOIN 125 MG/5ML PO SUSP
100.0000 mg | Freq: Three times a day (TID) | ORAL | Status: DC
  Administered 2015-08-20: 100 mg via ORAL
  Filled 2015-08-20 (×3): qty 4

## 2015-08-20 MED ORDER — PHENYTOIN 125 MG/5ML PO SUSP
100.0000 mg | Freq: Two times a day (BID) | ORAL | Status: DC
  Administered 2015-08-20 – 2015-08-23 (×6): 100 mg via ORAL
  Filled 2015-08-20 (×9): qty 4

## 2015-08-20 MED ORDER — ENOXAPARIN SODIUM 60 MG/0.6ML ~~LOC~~ SOLN
0.5000 mg/kg | SUBCUTANEOUS | Status: DC
  Administered 2015-08-20 – 2015-08-23 (×4): 60 mg via SUBCUTANEOUS
  Filled 2015-08-20 (×4): qty 0.6

## 2015-08-20 NOTE — Progress Notes (Signed)
Speech Language Pathology Treatment: Dysphagia  Patient Details Name: Carol Page MRN: [id number] DOB: [birth year] Today's Date: 08/20/2015 Time: [id number] SLP Time Calculation (min) (ACUTE ONLY): 10 min  Assessment / Plan / Recommendation Clinical Impression  Pt shows further improvements in mentation today, that increase her tolerance of advanced PO trials. No overt s/s of aspiration seen across intake. Mastication remains prolonged with dentures in place, and Min cues are provided for clearance of oral residue. Pt is now able to self-feed without assistance, further facilitating her safety and awareness. Recommend advancement to Dys 2 diet and thin liquids. Will continue to follow for tolerance.   HPI HPI: 80 year old female patient with history of hypertension, seizure disorder, prediabetes, chronic urinary incontinence, dyslipidemia, severe obesity, prior CVA, anxiety and depression and history of chronic leg pain who was sent to the ER by her family due to altered mentation. CXR demonstrated no infiltrate and family denies difficulties with swallowing PTA.      SLP Plan  Continue with current plan of care     Recommendations  Diet recommendations: Dysphagia 2 (fine chop);Thin liquid Liquids provided via: Cup;Straw Medication Administration: Crushed with puree Supervision: Full supervision/cueing for compensatory strategies;Patient able to self feed Compensations: Slow rate;Small sips/bites;Lingual sweep for clearance of pocketing Postural Changes and/or Swallow Maneuvers: Seated upright 90 degrees             Oral Care Recommendations: Oral care BID Follow up Recommendations: Skilled Nursing facility;24 hour supervision/assistance Plan: Continue with current plan of care     GO               Germain Osgood, M.A. CCC-SLP [phone]  Germain Osgood 08/20/2015, 9:56 AM

## 2015-08-20 NOTE — Clinical Social Work Note (Signed)
Clinical Social Work Assessment  Patient Details  Name: Carol Page MRN: [id number] Date of Birth: [birth year]  Date of referral:  08/20/15               Reason for consult:  Facility Placement                Permission sought to share information with:  Facility Sport and exercise psychologist, Family Supports Permission granted to share information::  Yes, Verbal Permission Granted  Name::     [place] area SNFs  Agency::     Relationship::     Contact Information:     Housing/Transportation Living arrangements for the past 2 months:  Single Family Home (Lives with Dtr, Carol Page) Source of Information:  Adult Children Patient Interpreter Needed:  None Criminal Activity/Legal Involvement Pertinent to Current Situation/Hospitalization:  No - Comment as needed Significant Relationships:  Adult Children Lives with:  Adult Children Do you feel safe going back to the place where you live?  Yes Need for family participation in patient care:  Yes (Comment)  Care giving concerns:    Facilities manager / plan:  SW spoke with pt's dtr, Carol Page, re PT recommendation for SNF as pt not oriented to situation. CSW explained placement process and answered questions. Pt with history of rehab in [place] per pt's dtr. Preference is [facility] as it is close to pt's home. Will f/u with offers once available.   Employment status:  Retired Forensic scientist:  Medicare PT Recommendations:  [place] / Referral to community resources:     Patient/Family's Response to care:  Pt's dtr agreeable to d/c plan.   Patient/Family's Understanding of and Emotional Response to Diagnosis, Current Treatment, and Prognosis:    Emotional Assessment Appearance:    Attitude/Demeanor/Rapport:    Affect (typically observed):    Orientation:    Alcohol / Substance use:    Psych involvement (Current and /or in the community):     Discharge Needs  Concerns to be addressed:   Discharge Planning Concerns Readmission within the last 30 days:  No Current discharge risk:  None Barriers to Discharge:  No Barriers Identified   Amador Cunas, [place] 08/20/2015, 2:54 PM

## 2015-08-20 NOTE — Progress Notes (Signed)
Physical Therapy Treatment Patient Details Name: Carol Page MRN: [id number] DOB: [birth year] Today's Date: 08/20/2015    History of Present Illness Pt. admitted 08/13/15 with sepsie due to colitis?, acute toxic encephalopathy, AKI/metabilic acedemia, large stool burden/impaction, severe obesity and chronic urinary incontinence.  Pt. with history of CVA, prediabetes, chronic leg pain, seizure disorder.  Pt. reportedly bed bound at home.      PT Comments    Patient has made small gains in mobility but required max A/max A +2 to perform bed mobility and attempt sit to stand. Current plan remains appropriate.   Follow Up Recommendations  SNF;Supervision/Assistance - 24 hour     Equipment Recommendations  Other (comment) (TBD once DC dispo is  confirmed)    Recommendations for Other Services       Precautions / Restrictions Precautions Precautions: Fall;Other (comment) (risk of skin breakdown due to limited mobility)    Mobility  Bed Mobility Overal bed mobility: Needs Assistance Bed Mobility: Rolling;Supine to Sit;Sit to Supine Rolling: Max assist   Supine to sit: Max assist;+2 for safety/equipment Sit to supine: Max assist;+2 for physical assistance   General bed mobility comments: max for rolling side to side with max verbal and tactile cues for hand placement and sequencing; pt able to assist with UE and minimally with movement of LE but unable to roll without max A and max A to remain sidelying position; max A to elevate trunk, advance bilat LE to EOB, and scoot hip forward with use of bed pad; max A to bring bilat LE into bed from sitting position  Transfers Overall transfer level: Needs assistance (unable) Equipment used: Rolling walker (2 wheeled) Transfers: Sit to/from Stand Sit to Stand: Max assist;Total assist;+2 physical assistance;+2 safety/equipment         General transfer comment: attempted sit to stand X 2 with one instance of lifing up from EOB but unable  to achieve erect posture; max A +2 to power up and total A +2 to achieve very brief squat before returning to sit   Ambulation/Gait Ambulation/Gait assistance:  (unable)               Stairs            Wheelchair Mobility    Modified Rankin (Stroke Patients Only)       Balance                                    Cognition Arousal/Alertness: Awake/alert Behavior During Therapy: WFL for tasks assessed/performed Overall Cognitive Status: No family/caregiver present to determine baseline cognitive functioning       Memory: Decreased recall of precautions;Decreased short-term memory              Exercises General Exercises - Lower Extremity Long Arc Quad: AROM;Both;5 reps;Seated    General Comments        Pertinent Vitals/Pain Pain Assessment: No/denies pain Pain Intervention(s): Monitored during session    Home Living                      Prior Function            PT Goals (current goals can now be found in the care plan section) Acute Rehab PT Goals Patient Stated Goal: pt. did not state goals, agreeable to PT with encouragement PT Goal Formulation: Patient unable to participate in goal setting Time For Goal Achievement: 08/31/15 Potential  to Achieve Goals: Fair Progress towards PT goals: Progressing toward goals    Frequency  Other (Comment) (trial of PT on 2x/ week basis to see if she can progress)    PT Plan Current plan remains appropriate    Co-evaluation             End of Session   Activity Tolerance: Patient limited by pain;Patient tolerated treatment well Patient left: in bed;with call bell/phone within reach;with bed alarm set     Time: [id number] PT Time Calculation (min) (ACUTE ONLY): 22 min  Charges:  $Therapeutic Activity: 8-22 mins                    G Codes:      Salina April, PTA Pager: [phone]   08/20/2015, 1:19 PM

## 2015-08-20 NOTE — Progress Notes (Signed)
[facility] PROGRESS NOTE  Carol Page [id number] DOB: Mar 27, 1936 DOA: 08/13/2015 PCP: Webb Silversmith, NP  Brief Summary  This is a 80 year old female patient with history of hypertension, seizure disorder, prediabetes, chronic urinary incontinence, dyslipidemia, severe obesity, prior CVA, anxiety and depression and history of chronic leg pain who was sent to the ER by her family due to altered mentation. At time of my assessment patient no family was at the bedside. Per ED provider and documentation in the EMR EMS reported that the patient's daughter stated that the patient was last seen normal at 9 PM last night on 1/23 and when the family came by to check her this morning she was less responsive and confused. It was documented she had been drowsy with "UTI type symptoms" based on family report. CBG was 154 at the scene. Because of significant altered mentation patient was to contribute to history portion of exam. Of note patient arrived with a completed DO NOT RESUSCITATE form in place.  In the ER, she was febrile to 102F and tachycardic.  CXR demonstrated no infiltrate and UA had only 0-5 WBC.  CT head was without acute abnormality.   UA was negative. CT abdomen and pelvis showed colitis  Subjective-patient less confused, abdominal pain has resolved  Assessment/Plan  Sepsis due to colitis?  Initially, it was thought that she might have a urinary tract infection, however her urinalysis demonstrated only 0-5 white blood cells.  Started on  vancomycin and Zosyn, vancomycin has been discontinued, patient has been on Zosyn since 1/24, patient had 2 formed bowel movements and her white count is coming down No pneumonia on chest x-ray -  CT scan of abdomen and pelvis Large stool burden throughout the colon with wall thickening and stranding about the transverse and descending colon most consistent with colitis. Patient was also found to be impacted and was manually disimpacted during this  admission,  abdomen continues to be distended  , KUB on 1/30 showed persistent inflammation of the transverse colon We resumed Zosyn due to persistently elevated white count Anticipate patient can switch to Cipro and Flagyl tomorrow - respiratory viral panel negative - PICC line placed 1/24, continue PICC line Patient is improving so no indication for repeat imaging   Acute toxic encephalopathy-resolved with intermittent sundowning Likely secondary to infection, acute kidney injury, colitis, elevated Dilantin level CT head on 1/24 was negative Offered to do an MRI brain if continues, but patient has no gross focal deficits  No fever , doubt new infection , ammonia level XLIV Dilantin level supratherapeutic at 32 after correction for albumin, decrease dose and recheck free Dilantin level tomorrow  AKI / Metabolic acidemia, prerenal, resolving with IVF -Continue to hold thiazide diuretic and ACE inhibitor Cr improving 1.25>1.72> 1.13> 0.82> 0.6 DC IV fluids  Hypokalemia Replete and check a magnesium level   Essential hypertension, with borderline hypertension -  Continue to hold antihypertensives   Seizure disorder, phenytoin level mildly elevated, which may be secondary to dehydration Dilantin level therapeutic 15.9 on admission Reduced Dilantin to 100 mg BID   Prediabetes,  -Continue CBG every 6  hours,-SSI,   Urinary incontinence - DC Foley catheter   HLD  -Preadmission statin on hold   Severe obesity (BMI >= 40)    History of CVA  -Preadmission Plavix and statin on hold  Constipation Continue MiraLAX, Colace    Diet:   Dysphagia 1 (puree);Nectar-thick liquid   Proph:  lovenox  Code Status: DNR Family Communication: patient, her daughter  and her son-in-law Disposition Plan: Continue MedSurg, social work consultation for placement, anticipate discharge tomorrow   Consultants:  none  Procedures:  CT head  CT  abd/pelvis  Antibiotics: Anti-infectives    Start     Dose/Rate Route Frequency Ordered Stop   08/19/15 1200  piperacillin-tazobactam (ZOSYN) IVPB 3.375 g     3.375 g 12.5 mL/hr over 240 Minutes Intravenous Every 8 hours 08/19/15 1102     08/18/15 1600  metroNIDAZOLE (FLAGYL) 50 mg/ml oral suspension 500 mg  Status:  Discontinued     500 mg Oral 3 times daily 08/18/15 1240 08/19/15 1056   08/18/15 1400  metroNIDAZOLE (FLAGYL) tablet 500 mg  Status:  Discontinued     500 mg Oral 3 times per day 08/18/15 1158 08/18/15 1241   08/18/15 1400  ciprofloxacin (CIPRO) 500 MG/5ML (10%) suspension 500 mg  Status:  Discontinued     500 mg Oral 2 times daily 08/18/15 1240 08/19/15 1056   08/18/15 1200  ciprofloxacin (CIPRO) tablet 500 mg  Status:  Discontinued     500 mg Oral 2 times daily 08/18/15 1158 08/18/15 1241   08/14/15 1700  cefTRIAXone (ROCEPHIN) 1 g in dextrose 5 % 50 mL IVPB  Status:  Discontinued     1 g 100 mL/hr over 30 Minutes Intravenous Every 24 hours 08/13/15 1628 08/14/15 1026   08/14/15 1200  vancomycin (VANCOCIN) 1,250 mg in sodium chloride 0.9 % 250 mL IVPB  Status:  Discontinued     1,250 mg 166.7 mL/hr over 90 Minutes Intravenous Every 24 hours 08/14/15 1146 08/16/15 0856   08/14/15 1200  piperacillin-tazobactam (ZOSYN) IVPB 3.375 g  Status:  Discontinued     3.375 g 12.5 mL/hr over 240 Minutes Intravenous Every 8 hours 08/14/15 1146 08/18/15 1158   08/13/15 1700  cefTRIAXone (ROCEPHIN) 2 g in dextrose 5 % 50 mL IVPB     2 g 100 mL/hr over 30 Minutes Intravenous  Once 08/13/15 1614 08/13/15 1752   08/13/15 1300  vancomycin (VANCOCIN) IVPB 1000 mg/200 mL premix  Status:  Discontinued     1,000 mg 200 mL/hr over 60 Minutes Intravenous  Once 08/13/15 1230 08/13/15 1453   08/13/15 1215  piperacillin-tazobactam (ZOSYN) IVPB 3.375 g     3.375 g 100 mL/hr over 30 Minutes Intravenous  Once 08/13/15 1209 08/13/15 1243   08/13/15 1215  vancomycin (VANCOCIN) IVPB 1000 mg/200 mL  premix     1,000 mg 200 mL/hr over 60 Minutes Intravenous  Once 08/13/15 1209 08/13/15 1315         Objective: Filed Vitals:   08/19/15 1357 08/19/15 2201 08/20/15 0412 08/20/15 0500  BP: 159/45 154/56 155/69   Pulse: 95 93 94   Temp: 97.9 F (36.6 C) 98.2 F (36.8 C) 99.6 F (37.6 C)   TempSrc: Oral Oral Oral   Resp: 18 18 18    Height:      Weight:    115 kg (253 lb 8.5 oz)  SpO2: 97% 96% 94%     Intake/Output Summary (Last 24 hours) at 08/20/15 1330 Last data filed at 08/20/15 0845  Gross per 24 hour  Intake    830 ml  Output   1125 ml  Net   -295 ml   Filed Weights   08/16/15 0500 08/17/15 0417 08/20/15 0500  Weight: 113 kg (249 lb 1.9 oz) 113.7 kg (250 lb 10.6 oz) 115 kg (253 lb 8.5 oz)   Body mass index is 44.92 kg/(m^2).  Exam:  General:  No acute distress, adult female  HEENT:  NCAT, MMM, no nuchal rigidity  Cardiovascular:  RRR, nl S1, S2 no mrg, 2+ pulses, warm extremities  Respiratory:  Wheezing slightly bilaterally, no increased WOB  Abdomen:   Hypoactive BS, moderately distended, and TTP diffusely, worse around umbilicus with some guarding, no rebound.    MSK:   Normal tone and bulk, 1+ edema of the left ankle and no right ankle edema (chronic)  Neuro:  Diffusely weak, slightly more pronounced on right side  Data Reviewed: Basic Metabolic Panel:  Recent Labs Lab 08/15/15 0520 08/16/15 0425 08/16/15 1229 08/17/15 0315 08/19/15 0426 08/20/15 0445  NA 137 140  --  141 141 140  K 3.7 3.3*  --  3.5 3.6 3.6  CL 110 113*  --  110 107 105  CO2 20* 20*  --  22 25 27   GLUCOSE 131* 128*  --  147* 119* 122*  BUN 43* 32*  --  18 10 9   CREATININE 1.72* 1.13*  --  0.82 0.64 0.66  CALCIUM 7.3* 7.7*  --  7.9* 7.6* 7.6*  MG  --   --  1.7  --   --   --    Liver Function Tests:  Recent Labs Lab 08/16/15 0425 08/17/15 0315 08/19/15 0426 08/20/15 0445  AST 77* 49* 33 36  ALT 33 32 28 31  ALKPHOS 57 68 75 95  BILITOT 1.0 0.8 0.7 0.7  PROT  4.9* 5.2* 4.5* 5.0*  ALBUMIN 1.9* 1.8* 1.7* 1.8*   No results for input(s): LIPASE, AMYLASE in the last 168 hours.  Recent Labs Lab 08/18/15 1400  AMMONIA 44*   CBC:  Recent Labs Lab 08/15/15 0520 08/16/15 0425 08/17/15 0315 08/19/15 0426 08/19/15 1120 08/20/15 0445  WBC 14.2* 16.4* 15.5* 16.7*  --  13.7*  NEUTROABS  --   --   --   --  13.5* 11.5*  HGB 8.8* 9.1* 9.7* 9.7*  --  10.6*  HCT 27.4* 27.7* 28.6* 28.8*  --  31.6*  MCV 101.1* 100.0 98.6 99.3  --  99.1  PLT 150 161 162 168  --  185    Recent Results (from the past 240 hour(s))  Urine culture     Status: None   Collection Time: 08/13/15 11:02 AM  Result Value Ref Range Status   Specimen Description URINE, CATHETERIZED  Final   Special Requests Normal  Final   Culture NO GROWTH 1 DAY  Final   Report Status 08/14/2015 FINAL  Final  Blood culture (routine x 2)     Status: None   Collection Time: 08/13/15  1:57 PM  Result Value Ref Range Status   Specimen Description BLOOD RIGHT ANTECUBITAL  Final   Special Requests   Final    BOTTLES DRAWN AEROBIC AND ANAEROBIC 5CC DRAWN AFTER ABX   Culture NO GROWTH 5 DAYS  Final   Report Status 08/18/2015 FINAL  Final  Respiratory virus panel     Status: None   Collection Time: 08/13/15  4:59 PM  Result Value Ref Range Status   Source - RVPAN NASOPHARYNGEAL  Corrected   Respiratory Syncytial Virus A Negative Negative Final   Respiratory Syncytial Virus B Negative Negative Final   Influenza A Negative Negative Final   Influenza B Negative Negative Final   Parainfluenza 1 Negative Negative Final   Parainfluenza 2 Negative Negative Final   Parainfluenza 3 Negative Negative Final   Metapneumovirus Negative Negative Final   Rhinovirus Negative  Negative Final   Adenovirus Negative Negative Final    Comment: (NOTE) Performed At: [facility] [place], [place] [id number] Lindon Romp MD [id number]   MRSA PCR Screening     Status: None    Collection Time: 08/13/15  4:59 PM  Result Value Ref Range Status   MRSA by PCR NEGATIVE NEGATIVE Final    Comment:        The GeneXpert MRSA Assay (FDA approved for NASAL specimens only), is one component of a comprehensive MRSA colonization surveillance program. It is not intended to diagnose MRSA infection nor to guide or monitor treatment for MRSA infections.   Blood culture (routine x 2)     Status: None   Collection Time: 08/13/15  7:20 PM  Result Value Ref Range Status   Specimen Description BLOOD LEFT HAND  Final   Special Requests IN PEDIATRIC BOTTLE 2CC  Final   Culture NO GROWTH 5 DAYS  Final   Report Status 08/18/2015 FINAL  Final  Culture, blood (single) w Reflex to ID Panel     Status: None   Collection Time: 08/14/15  1:39 PM  Result Value Ref Range Status   Specimen Description BLOOD LEFT ANTECUBITAL  Final   Special Requests IN PEDIATRIC BOTTLE 3CC  Final   Culture NO GROWTH 5 DAYS  Final   Report Status 08/19/2015 FINAL  Final     Studies: Dg Chest Port 1 View  08/19/2015  CLINICAL DATA:  Fever. EXAM: PORTABLE CHEST 1 VIEW COMPARISON:  August 14, 2015. FINDINGS: Stable cardiomediastinal silhouette. Hypoinflation of the lungs is noted. Mild bibasilar subsegmental atelectasis is noted. Right-sided PICC line is noted with distal tip in expected position of SVC. No pneumothorax or significant pleural effusion is noted. Bony thorax is unremarkable. IMPRESSION: Hypoinflation of the lungs with mild bibasilar subsegmental atelectasis. Electronically Signed   By: Marijo Conception, M.D.   On: 08/19/2015 11:00   Dg Abd Portable 1v  08/19/2015  CLINICAL DATA:  Acute abdominal pain.  Constipation. EXAM: PORTABLE ABDOMEN - 1 VIEW COMPARISON:  08/14/2015 FINDINGS: Relatively featureless loop of transverse colon again noted, consistent with colitis. Appearance of the proximal and distal colon is normal. No evidence for free intraperitoneal air on this supine view of the abdomen.  IMPRESSION: Persistent changes suggesting inflammation of the transverse colon. Electronically Signed   By: Nolon Nations M.D.   On: 08/19/2015 10:52    Scheduled Meds: . enoxaparin (LOVENOX) injection  0.5 mg/kg Subcutaneous Q24H  . insulin aspart  0-9 Units Subcutaneous 6 times per day  . lactulose  10 g Oral BID  . phenytoin  100 mg Oral BID  . piperacillin-tazobactam (ZOSYN)  IV  3.375 g Intravenous Q8H  . senna-docusate  1 tablet Oral BID  . sodium chloride flush  10-40 mL Intracatheter Q12H  . sodium chloride flush  3 mL Intravenous Q12H   Continuous Infusions:    Principal Problem:   Sepsis due to urinary tract infection (HCC) Active Problems:   Essential hypertension   HLD (hyperlipidemia)   Severe obesity (BMI >= 40) (HCC)   Seizure disorder (HCC)   Urinary incontinence   History of CVA (cerebrovascular accident)   AKI (acute kidney injury) ([place])   Metabolic acidemia   Sepsis ([place])   Abdominal distension   Dyspnea   Encounter for palliative care   Goals of care, counseling/discussion   Abdominal pain    Time spent: 30 min    Rawad Bochicchio  [facility] Pager [phone]. If 7PM-7AM, please contact night-coverage at [email or web address], password [facility] 08/20/2015, 1:30 PM  LOS: 7 days

## 2015-08-21 DIAGNOSIS — A419 Sepsis, unspecified organism: Principal | ICD-10-CM

## 2015-08-21 DIAGNOSIS — N390 Urinary tract infection, site not specified: Secondary | ICD-10-CM

## 2015-08-21 LAB — COMPREHENSIVE METABOLIC PANEL
ALBUMIN: 1.9 g/dL — AB (ref 3.5–5.0)
ALK PHOS: 89 U/L (ref 38–126)
ALT: 32 U/L (ref 14–54)
ANION GAP: 8 (ref 5–15)
AST: 36 U/L (ref 15–41)
BUN: 9 mg/dL (ref 6–20)
CALCIUM: 7.3 mg/dL — AB (ref 8.9–10.3)
CHLORIDE: 103 mmol/L (ref 101–111)
CO2: 28 mmol/L (ref 22–32)
Creatinine, Ser: 0.68 mg/dL (ref 0.44–1.00)
GFR calc Af Amer: >60 mL/min (ref >60)
GFR calc non Af Amer: >60 mL/min (ref >60)
GLUCOSE: 120 mg/dL — AB (ref 65–99)
Potassium: 3.4 mmol/L — ABNORMAL LOW (ref 3.5–5.1)
SODIUM: 139 mmol/L (ref 135–145)
Total Bilirubin: 0.6 mg/dL (ref 0.3–1.2)
Total Protein: 4.8 g/dL — ABNORMAL LOW (ref 6.5–8.1)

## 2015-08-21 LAB — GLUCOSE, CAPILLARY
GLUCOSE-CAPILLARY: 118 mg/dL — AB (ref 65–99)
GLUCOSE-CAPILLARY: 100 mg/dL — AB (ref 65–99)
Glucose-Capillary: 109 mg/dL — ABNORMAL HIGH (ref 65–99)
Glucose-Capillary: 115 mg/dL — ABNORMAL HIGH (ref 65–99)
Glucose-Capillary: 116 mg/dL — ABNORMAL HIGH (ref 65–99)
Glucose-Capillary: 146 mg/dL — ABNORMAL HIGH (ref 65–99)

## 2015-08-21 LAB — CBC
HCT: 26.8 % — ABNORMAL LOW (ref 36.0–46.0)
HEMOGLOBIN: 9.1 g/dL — AB (ref 12.0–15.0)
MCH: 33.7 pg (ref 26.0–34.0)
MCHC: 34 g/dL (ref 30.0–36.0)
MCV: 99.3 fL (ref 78.0–100.0)
Platelets: 208 10*3/uL (ref 150–400)
RBC: 2.7 MIL/uL — AB (ref 3.87–5.11)
RDW: 13.5 % (ref 11.5–15.5)
WBC: 13.1 10*3/uL — ABNORMAL HIGH (ref 4.0–10.5)

## 2015-08-21 MED ORDER — CLINDAMYCIN HCL 300 MG PO CAPS
300.0000 mg | ORAL_CAPSULE | Freq: Three times a day (TID) | ORAL | Status: DC
  Administered 2015-08-21 – 2015-08-23 (×7): 300 mg via ORAL
  Filled 2015-08-21 (×7): qty 1

## 2015-08-21 NOTE — Progress Notes (Signed)
Pt unable to void on own.  Pt had foley removed on 08-20-15 at about noon. Pt dtv at 9pm. Pt bladder scanned with 468ml present in the bladder. Pt unable to void and an intermittent cath was done x1. Pt bladder scanned at 5am and the amount was 563ml. Pt has had two foley's in place since admission. Called [facility] for orders. Baltazar Najjar ordered placing a foley for acute urinary retention instead of doing another intermittent cath. Will attempt foley placement.

## 2015-08-21 NOTE — Progress Notes (Signed)
Speech Language Pathology Treatment: Dysphagia  Patient Details Name: Carol Page MRN: [id number] DOB: 19-Jul-1936 Today's Date: 08/21/2015 Time: [id number] SLP Time Calculation (min) (ACUTE ONLY): 13 min  Assessment / Plan / Recommendation Clinical Impression  Pt consumed self-fed PO trials with prolonged mastication of soft solids and one immediate throat clear after straw sip of thin liquid. Min cues provided by SLP for small sips with no further overt signs of reduced airway protection. Recommend to continue current diet.   HPI HPI: 80 year old female patient with history of hypertension, seizure disorder, prediabetes, chronic urinary incontinence, dyslipidemia, severe obesity, prior CVA, anxiety and depression and history of chronic leg pain who was sent to the ER by her family due to altered mentation. CXR demonstrated no infiltrate and family denies difficulties with swallowing PTA.      SLP Plan  Continue with current plan of care     Recommendations  Diet recommendations: Dysphagia 2 (fine chop);Thin liquid Liquids provided via: Cup;Straw Medication Administration: Crushed with puree Supervision: Full supervision/cueing for compensatory strategies;Patient able to self feed Compensations: Slow rate;Small sips/bites;Lingual sweep for clearance of pocketing Postural Changes and/or Swallow Maneuvers: Seated upright 90 degrees             Oral Care Recommendations: Oral care BID Follow up Recommendations: Skilled Nursing facility;24 hour supervision/assistance Plan: Continue with current plan of care     GO               Germain Osgood, M.A. CCC-SLP [phone]  Germain Osgood 08/21/2015, 3:47 PM

## 2015-08-21 NOTE — Progress Notes (Signed)
[facility] PROGRESS NOTE  Carol Page [id number] DOB: 16-Dec-1935 DOA: 08/13/2015 PCP: Webb Silversmith, NP  Brief Summary    80 year old female Prior CVA with residual memory loss Prediabetes Body mass index is 44.3 kg/(m^2). Seizure disorder Bipolar Prediabetes Prior admissions for pyelonephritis history of metabolic encephalopathy in the past on admission  Admitted to [facility] 1/24th record temp 102 sinus tachycardiac, WBC 16, BUN/creatinine 29/1.5 baseline 0.7 and potential urine source for sepsis Also found to be in acute kidney injury and metabolic acidosis Started initially on vancomycin and Zosyn   Subjective-  fair Not feeling completely 100% but feels better Tolerating some diet Still somewhat confused Cannot tell me the date or month  Assessment/Plan  Sepsis due to colitis?   Initially, it was thought that she might have a urinary tract infection, however her urinalysis demonstrated only 0-5 white blood cells.  Started on  vancomycin and Zosyn, vancomycin has been discontinued, patient has been on Zosyn since 1/24, patient had 2 formed bowel movements and her white count is coming down  No pneumonia on chest x-ray -  CT scan of abdomen and pelvis Large stool burden throughout the colon with wall thickening and stranding about the transverse and descending colon most consistent with colitis. Patient was also found to be impacted and was manually disimpacted during this admission,  abdomen continues to be distended  , KUB on 1/30 showed persistent inflammation of the transverse colon We resumed Zosyn due to persistently elevated white count Patient was switched to clindamycin on 2/1 - respiratory viral panel negative - PICC line placed 1/24, continue PICC line -Tells me she has been having diarrhea Patient is improving so no indication for repeat imaging  Acute toxic encephalopathy-resolved with intermittent sundowning Likely secondary to  infection, acute kidney injury, colitis, elevated Dilantin level CT head on 1/24 was negative Offered to do an MRI brain if continues, but patient has no gross focal deficits  No fever , doubt new infection , ammonia level XLIV Dilantin level supratherapeutic at 32 -->14, corrected level is 29, so still supratherapeutic -Hold dilantin for now  AKI / Metabolic acidemia, prerenal, resolving with IVF -Continue to hold thiazide diuretic and ACE inhibitor Cr improving 1.25>1.72> 1.13> 0.82> 0.6 DC IV fluids  Hypokalemia Replete and check a magnesium level in am again   Essential hypertension, with borderline hypertension -  Continue to hold antihypertensives   Seizure disorder, phenytoin level mildly elevated, which may be secondary to dehydration Dilantin level therapeutic 15.9 on admission Reduced Dilantin to off   Prediabetes,  -Continue CBG every 6  hours,-SSI, ranging between 101 20   Urinary incontinence - DC Foley catheter -needed to replace   HLD  -Preadmission statin on hold   Severe obesity (BMI >= 40)    History of CVA  -Preadmission Plavix and statin on hold  Constipation Continue MiraLAX, Colace    Diet:   Dysphagia 1 (puree);Nectar-thick liquid   Proph:  lovenox  Code Status: DNR Family Communication: patient, her daughter and her son-in-law Disposition Plan: Continue MedSurg, social work consultation for placement, anticipate d/c 24 hr-36 hr to SNF   Consultants:  none  Procedures:  CT head  CT abd/pelvis  Antibiotics: Anti-infectives    Start     Dose/Rate Route Frequency Ordered Stop   08/21/15 0745  clindamycin (CLEOCIN) capsule 300 mg     300 mg Oral 3 times per day 08/21/15 0744     08/19/15 1200  piperacillin-tazobactam (ZOSYN) IVPB 3.375 g  Status:  Discontinued     3.375 g 12.5 mL/hr over 240 Minutes Intravenous Every 8 hours 08/19/15 1102 08/21/15 0744   08/18/15 1600  metroNIDAZOLE (FLAGYL) 50 mg/ml oral suspension 500 mg   Status:  Discontinued     500 mg Oral 3 times daily 08/18/15 1240 08/19/15 1056   08/18/15 1400  metroNIDAZOLE (FLAGYL) tablet 500 mg  Status:  Discontinued     500 mg Oral 3 times per day 08/18/15 1158 08/18/15 1241   08/18/15 1400  ciprofloxacin (CIPRO) 500 MG/5ML (10%) suspension 500 mg  Status:  Discontinued     500 mg Oral 2 times daily 08/18/15 1240 08/19/15 1056   08/18/15 1200  ciprofloxacin (CIPRO) tablet 500 mg  Status:  Discontinued     500 mg Oral 2 times daily 08/18/15 1158 08/18/15 1241   08/14/15 1700  cefTRIAXone (ROCEPHIN) 1 g in dextrose 5 % 50 mL IVPB  Status:  Discontinued     1 g 100 mL/hr over 30 Minutes Intravenous Every 24 hours 08/13/15 1628 08/14/15 1026   08/14/15 1200  vancomycin (VANCOCIN) 1,250 mg in sodium chloride 0.9 % 250 mL IVPB  Status:  Discontinued     1,250 mg 166.7 mL/hr over 90 Minutes Intravenous Every 24 hours 08/14/15 1146 08/16/15 0856   08/14/15 1200  piperacillin-tazobactam (ZOSYN) IVPB 3.375 g  Status:  Discontinued     3.375 g 12.5 mL/hr over 240 Minutes Intravenous Every 8 hours 08/14/15 1146 08/18/15 1158   08/13/15 1700  cefTRIAXone (ROCEPHIN) 2 g in dextrose 5 % 50 mL IVPB     2 g 100 mL/hr over 30 Minutes Intravenous  Once 08/13/15 1614 08/13/15 1752   08/13/15 1300  vancomycin (VANCOCIN) IVPB 1000 mg/200 mL premix  Status:  Discontinued     1,000 mg 200 mL/hr over 60 Minutes Intravenous  Once 08/13/15 1230 08/13/15 1453   08/13/15 1215  piperacillin-tazobactam (ZOSYN) IVPB 3.375 g     3.375 g 100 mL/hr over 30 Minutes Intravenous  Once 08/13/15 1209 08/13/15 1243   08/13/15 1215  vancomycin (VANCOCIN) IVPB 1000 mg/200 mL premix     1,000 mg 200 mL/hr over 60 Minutes Intravenous  Once 08/13/15 1209 08/13/15 1315         Objective: Filed Vitals:   08/20/15 0500 08/20/15 2039 08/21/15 0353 08/21/15 0500  BP:  121/57 158/53   Pulse:  89 86   Temp:  98.4 F (36.9 C) 98.8 F (37.1 C)   TempSrc:  Oral Oral   Resp:  18 16    Height:      Weight: 115 kg (253 lb 8.5 oz)   113.4 kg (250 lb)  SpO2:  93% 93%     Intake/Output Summary (Last 24 hours) at 08/21/15 1231 Last data filed at 08/21/15 0517  Gross per 24 hour  Intake    150 ml  Output   1200 ml  Net  -1050 ml   Filed Weights   08/17/15 0417 08/20/15 0500 08/21/15 0500  Weight: 113.7 kg (250 lb 10.6 oz) 115 kg (253 lb 8.5 oz) 113.4 kg (250 lb)   Body mass index is 44.3 kg/(m^2).  Exam:   General:  No acute distress, adult female  HEENT:  NCAT, MMM, no nuchal rigidity  Cardiovascular:  RRR, nl S1, S2 no mrg, 2+ pulses, warm extremities  Respiratory:  Wheezing slightly bilaterally, no increased WOB  Abdomen:   Hypoactive BS, moderately distended, and TTP diffusely, worse around umbilicus with some guarding,  no rebound.    MSK:   Normal tone and bulk, 1+ edema of the left ankle and no right ankle edema (chronic)  Neuro:  Diffusely weak, slightly more pronounced on right side  Data Reviewed: Basic Metabolic Panel:  Recent Labs Lab 08/16/15 0425 08/16/15 1229 08/17/15 0315 08/19/15 0426 08/20/15 0445 08/21/15 0500  NA 140  --  141 141 140 139  K 3.3*  --  3.5 3.6 3.6 3.4*  CL 113*  --  110 107 105 103  CO2 20*  --  22 25 27 28   GLUCOSE 128*  --  147* 119* 122* 120*  BUN 32*  --  18 10 9 9   CREATININE 1.13*  --  0.82 0.64 0.66 0.68  CALCIUM 7.7*  --  7.9* 7.6* 7.6* 7.3*  MG  --  1.7  --   --   --   --    Liver Function Tests:  Recent Labs Lab 08/16/15 0425 08/17/15 0315 08/19/15 0426 08/20/15 0445 08/21/15 0500  AST 77* 49* 33 36 36  ALT 33 32 28 31 32  ALKPHOS 57 68 75 95 89  BILITOT 1.0 0.8 0.7 0.7 0.6  PROT 4.9* 5.2* 4.5* 5.0* 4.8*  ALBUMIN 1.9* 1.8* 1.7* 1.8* 1.9*   No results for input(s): LIPASE, AMYLASE in the last 168 hours.  Recent Labs Lab 08/18/15 1400  AMMONIA 44*   CBC:  Recent Labs Lab 08/16/15 0425 08/17/15 0315 08/19/15 0426 08/19/15 1120 08/20/15 0445 08/21/15 0500  WBC 16.4* 15.5*  16.7*  --  13.7* 13.1*  NEUTROABS  --   --   --  13.5* 11.5*  --   HGB 9.1* 9.7* 9.7*  --  10.6* 9.1*  HCT 27.7* 28.6* 28.8*  --  31.6* 26.8*  MCV 100.0 98.6 99.3  --  99.1 99.3  PLT 161 162 168  --  185 208    Recent Results (from the past 240 hour(s))  Urine culture     Status: None   Collection Time: 08/13/15 11:02 AM  Result Value Ref Range Status   Specimen Description URINE, CATHETERIZED  Final   Special Requests Normal  Final   Culture NO GROWTH 1 DAY  Final   Report Status 08/14/2015 FINAL  Final  Blood culture (routine x 2)     Status: None   Collection Time: 08/13/15  1:57 PM  Result Value Ref Range Status   Specimen Description BLOOD RIGHT ANTECUBITAL  Final   Special Requests   Final    BOTTLES DRAWN AEROBIC AND ANAEROBIC 5CC DRAWN AFTER ABX   Culture NO GROWTH 5 DAYS  Final   Report Status 08/18/2015 FINAL  Final  Respiratory virus panel     Status: None   Collection Time: 08/13/15  4:59 PM  Result Value Ref Range Status   Source - RVPAN NASOPHARYNGEAL  Corrected   Respiratory Syncytial Virus A Negative Negative Final   Respiratory Syncytial Virus B Negative Negative Final   Influenza A Negative Negative Final   Influenza B Negative Negative Final   Parainfluenza 1 Negative Negative Final   Parainfluenza 2 Negative Negative Final   Parainfluenza 3 Negative Negative Final   Metapneumovirus Negative Negative Final   Rhinovirus Negative Negative Final   Adenovirus Negative Negative Final    Comment: (NOTE) Performed At: [facility] [address] [place], [place] [id number] Lindon Romp MD [id number]   MRSA PCR Screening     Status: None   Collection Time: 08/13/15  4:59 PM  Result Value Ref Range Status   MRSA by PCR NEGATIVE NEGATIVE Final    Comment:        The GeneXpert MRSA Assay (FDA approved for NASAL specimens only), is one component of a comprehensive MRSA colonization surveillance program. It is not intended to diagnose  MRSA infection nor to guide or monitor treatment for MRSA infections.   Blood culture (routine x 2)     Status: None   Collection Time: 08/13/15  7:20 PM  Result Value Ref Range Status   Specimen Description BLOOD LEFT HAND  Final   Special Requests IN PEDIATRIC BOTTLE 2CC  Final   Culture NO GROWTH 5 DAYS  Final   Report Status 08/18/2015 FINAL  Final  Culture, blood (single) w Reflex to ID Panel     Status: None   Collection Time: 08/14/15  1:39 PM  Result Value Ref Range Status   Specimen Description BLOOD LEFT ANTECUBITAL  Final   Special Requests IN PEDIATRIC BOTTLE 3CC  Final   Culture NO GROWTH 5 DAYS  Final   Report Status 08/19/2015 FINAL  Final  Gastrointestinal Panel by PCR , Stool     Status: None   Collection Time: 08/20/15 10:30 AM  Result Value Ref Range Status   Campylobacter species NOT DETECTED NOT DETECTED Final   Plesimonas shigelloides NOT DETECTED NOT DETECTED Final   Salmonella species NOT DETECTED NOT DETECTED Final   Yersinia enterocolitica NOT DETECTED NOT DETECTED Final   Vibrio species NOT DETECTED NOT DETECTED Final   Vibrio cholerae NOT DETECTED NOT DETECTED Final   Enteroaggregative E coli (EAEC) NOT DETECTED NOT DETECTED Final   Enteropathogenic E coli (EPEC) NOT DETECTED NOT DETECTED Final   Enterotoxigenic E coli (ETEC) NOT DETECTED NOT DETECTED Final   Shiga like toxin producing E coli (STEC) NOT DETECTED NOT DETECTED Final   E. coli O157 NOT DETECTED NOT DETECTED Final   Shigella/Enteroinvasive E coli (EIEC) NOT DETECTED NOT DETECTED Final   Cryptosporidium NOT DETECTED NOT DETECTED Final   Cyclospora cayetanensis NOT DETECTED NOT DETECTED Final   Entamoeba histolytica NOT DETECTED NOT DETECTED Final   Giardia lamblia NOT DETECTED NOT DETECTED Final   Adenovirus F40/41 NOT DETECTED NOT DETECTED Final   Astrovirus NOT DETECTED NOT DETECTED Final   Norovirus GI/GII NOT DETECTED NOT DETECTED Final   Rotavirus A NOT DETECTED NOT DETECTED Final    Sapovirus (I, II, IV, and V) NOT DETECTED NOT DETECTED Final     Studies: No results found.  Scheduled Meds: . clindamycin  300 mg Oral 3 times per day  . enoxaparin (LOVENOX) injection  0.5 mg/kg Subcutaneous Q24H  . insulin aspart  0-9 Units Subcutaneous 6 times per day  . lactulose  10 g Oral BID  . phenytoin  100 mg Oral BID  . senna-docusate  1 tablet Oral BID  . sodium chloride flush  10-40 mL Intracatheter Q12H  . sodium chloride flush  3 mL Intravenous Q12H   Continuous Infusions:    Principal Problem:   Sepsis due to urinary tract infection (HCC) Active Problems:   Essential hypertension   HLD (hyperlipidemia)   Severe obesity (BMI >= 40) (HCC)   Seizure disorder (HCC)   Urinary incontinence   History of CVA (cerebrovascular accident)   AKI (acute kidney injury) ([place])   Metabolic acidemia   Sepsis ([place])   Abdominal distension   Dyspnea   Encounter for palliative care   Goals of care, counseling/discussion  Abdominal pain    Time spent: 30 min    Verneita Griffes, MD [facility] [phone]

## 2015-08-21 NOTE — Progress Notes (Signed)
Nutrition Follow-up  DOCUMENTATION CODES:   Morbid obesity  INTERVENTION:  Encourage adequate PO intake.   NUTRITION DIAGNOSIS:   Inadequate oral intake related to inability to eat as evidenced by NPO status; advanced to diet; po 100%; improved  GOAL:   Patient will meet greater than or equal to 90% of their needs; met  MONITOR:   PO intake, Diet advancement, Skin, Weight trends, Labs, I & O's  REASON FOR ASSESSMENT:   Low Braden    ASSESSMENT:   80 yo Female with history of hypertension, seizure disorder, prediabetes, chronic urinary incontinence, dyslipidemia, severe obesity, prior CVA, anxiety and depression and history of chronic leg pain who was sent to the ER by her family due to altered mentation. She was admitted to the hospitalist service with a working diagnosis of UTI. Of note patient arrived with a completed DO NOT RESUSCITATE form in place. In the ER, she was febrile to 102F and tachycardic. CXR demonstrated no infiltrate and UA had only 0-5 WBC. CT head was without acute abnormality. She was started on broad spectrum antibiotics in the ER and narrowed to ceftriaxone after UA resulted.   Pt reports appetite is good. No recent meal completion recorded, however pt reports consuming most of her meals. Pt was encouraged to eat her food at meals. Labs and medications reviewed.   Diet Order:  DIET DYS 2 Room service appropriate?: Yes; Fluid consistency:: Thin  Skin:  Reviewed, no issues  Last BM:  1/31  Height:   Ht Readings from Last 1 Encounters:  08/13/15 5' 3"  (1.6 m)    Weight:   Wt Readings from Last 1 Encounters:  08/21/15 250 lb (113.4 kg)    Ideal Body Weight:  52.2 kg  BMI:  Body mass index is 44.3 kg/(m^2).  Estimated Nutritional Needs:   Kcal:  1700-1900  Protein:  90-100 gm  Fluid:  1.7-1.9 L  EDUCATION NEEDS:   No education needs identified at this time  Corrin Parker, MS, RD, LDN Pager # [phone] After hours/ weekend  pager # [phone]

## 2015-08-22 ENCOUNTER — Inpatient Hospital Stay (HOSPITAL_COMMUNITY): Payer: Medicare Other

## 2015-08-22 LAB — CBC WITH DIFFERENTIAL/PLATELET
BASOS ABS: 0 10*3/uL (ref 0.0–0.1)
Basophils Relative: 0 %
EOS ABS: 0.1 10*3/uL (ref 0.0–0.7)
EOS PCT: 1 %
HCT: 27.4 % — ABNORMAL LOW (ref 36.0–46.0)
Hemoglobin: 9.3 g/dL — ABNORMAL LOW (ref 12.0–15.0)
LYMPHS PCT: 9 %
Lymphs Abs: 0.9 10*3/uL (ref 0.7–4.0)
MCH: 33.6 pg (ref 26.0–34.0)
MCHC: 33.9 g/dL (ref 30.0–36.0)
MCV: 98.9 fL (ref 78.0–100.0)
Monocytes Absolute: 1 10*3/uL (ref 0.1–1.0)
Monocytes Relative: 10 %
NEUTROS PCT: 80 %
Neutro Abs: 7.4 10*3/uL (ref 1.7–7.7)
PLATELETS: 235 10*3/uL (ref 150–400)
RBC: 2.77 MIL/uL — AB (ref 3.87–5.11)
RDW: 13.4 % (ref 11.5–15.5)
WBC: 9.3 10*3/uL (ref 4.0–10.5)

## 2015-08-22 LAB — BASIC METABOLIC PANEL
ANION GAP: 9 (ref 5–15)
BUN: 6 mg/dL (ref 6–20)
CALCIUM: 7.5 mg/dL — AB (ref 8.9–10.3)
CO2: 26 mmol/L (ref 22–32)
Chloride: 103 mmol/L (ref 101–111)
Creatinine, Ser: 0.59 mg/dL (ref 0.44–1.00)
GFR calc Af Amer: >60 mL/min (ref >60)
Glucose, Bld: 114 mg/dL — ABNORMAL HIGH (ref 65–99)
POTASSIUM: 3.7 mmol/L (ref 3.5–5.1)
SODIUM: 138 mmol/L (ref 135–145)

## 2015-08-22 LAB — GLUCOSE, CAPILLARY
GLUCOSE-CAPILLARY: 101 mg/dL — AB (ref 65–99)
Glucose-Capillary: 98 mg/dL (ref 65–99)
Glucose-Capillary: 99 mg/dL (ref 65–99)
Glucose-Capillary: 105 mg/dL — ABNORMAL HIGH (ref 65–99)
Glucose-Capillary: 109 mg/dL — ABNORMAL HIGH (ref 65–99)
Glucose-Capillary: 105 mg/dL — ABNORMAL HIGH (ref 65–99)

## 2015-08-22 LAB — PHENYTOIN LEVEL, FREE AND TOTAL
Phenytoin, Free: 0.6 ug/mL — ABNORMAL LOW (ref 1.0–2.0)
Phenytoin, Total: 7.7 ug/mL — ABNORMAL LOW (ref 10.0–20.0)

## 2015-08-22 LAB — ALBUMIN: Albumin: 2 g/dL — ABNORMAL LOW (ref 3.5–5.0)

## 2015-08-22 LAB — PHENYTOIN LEVEL, TOTAL: PHENYTOIN LVL: 7.5 ug/mL — AB (ref 10.0–20.0)

## 2015-08-22 MED ORDER — STARCH (THICKENING) PO POWD: 227.0000 g | ORAL | Status: DC | PRN

## 2015-08-22 MED ORDER — PHENYTOIN 125 MG/5ML PO SUSP
100.0000 mg | Freq: Every day | ORAL | Status: DC
Start: 2015-08-22 — End: 2015-10-17

## 2015-08-22 MED ORDER — LORAZEPAM 1 MG PO TABS: ORAL_TABLET | ORAL | Status: DC

## 2015-08-22 MED ORDER — LACTULOSE 10 GM/15ML PO SOLN: 10.0000 g | Freq: Two times a day (BID) | ORAL | Status: DC | PRN

## 2015-08-22 MED ORDER — CLINDAMYCIN HCL 300 MG PO CAPS: 300.0000 mg | ORAL_CAPSULE | Freq: Three times a day (TID) | ORAL | Status: DC

## 2015-08-22 NOTE — Progress Notes (Signed)
MEDICATION RELATED CONSULT NOTE - FOLLOW UP   Pharmacy Consult for phenytoin Indication: hx of seizure   Allergies  Allergen Reactions  . Codeine Nausea And Vomiting  . Ether Nausea And Vomiting    Patient Measurements: Height: 5\' 3"  (160 cm) Weight: 247 lb 2.2 oz (112.1 kg) IBW/kg (Calculated) : 52.4  Vital Signs: Temp: 98.2 F (36.8 C) (02/02 0503) Temp Source: Oral (02/02 0503) BP: 140/47 mmHg (02/02 0503) Pulse Rate: 83 (02/02 0503) Intake/Output from previous day: 02/01 0701 - 02/02 0700 In: 640 [P.O.:640] Out: 500 [Urine:500] Intake/Output from this shift: Total I/O In: 0  Out: 1550 [Urine:1550]  Labs:  Recent Labs  08/20/15 0445 08/21/15 0500 08/22/15 0958  WBC 13.7* 13.1*  --   HGB 10.6* 9.1*  --   HCT 31.6* 26.8*  --   PLT 185 208  --   CREATININE 0.66 0.68  --   ALBUMIN 1.8* 1.9* 2.0*  PROT 5.0* 4.8*  --   AST 36 36  --   ALT 31 32  --   ALKPHOS 95 89  --   BILITOT 0.7 0.6  --    Estimated Creatinine Clearance: 70.9 mL/min (by C-G formula based on Cr of 0.68).   Assessment: Patient with Seizure disorder. On Phenytoin 200 mg BID prior to admission, with supratherapeutic level. Dose was decreased to 100 mg BID now. Acute toxic encephalopathy: resolved with intermittent sundowning, likely 2nd infection. CT head neg 1/24  1/24 phenytoin 21.1 (26 corrected) --> changed dose to 100 mg TID 1/26 phenytoin level 15.9  1/30 phenytoin level 14; Alb 1.7 Corrected phenytoin 31.82 --> changed to 100 mg BID 2/2 phenytoin level = 7.5 (14 corrected)   No seizure activities noted.  Goal of Therapy:  Corrected phenytoin level = 10-20 mcg/ml  Plan:  Continue phenytoin 100 mg BID Recommend outpatient level follow in 2 weeks and monitor seizure activities.   Carol Page 08/22/2015,1:58 PM

## 2015-08-22 NOTE — Clinical Social Work Note (Signed)
Clinical Social Worker continuing to follow patient and family for support and discharge planning needs.  Patient has a bed available at [facility] and per MD plan is for discharge 02/03.  Facility updated.  CSW remains available for support and to facilitate patient discharge plans once medically stable.  Barbette Or, [place]

## 2015-08-22 NOTE — Progress Notes (Signed)
[facility] PROGRESS NOTE  Niyati Amble [id number] DOB: January 17, 1936 DOA: 08/13/2015 PCP: Webb Silversmith, NP  Brief Summary    80 year old female Prior CVA with residual memory loss Prediabetes Body mass index is 43.79 kg/(m^2). Seizure disorder Bipolar Prediabetes Prior admissions for pyelonephritis history of metabolic encephalopathy in the past on admission  Admitted to [facility] 1/24th record temp 102 sinus tachycardiac, WBC 16, BUN/creatinine 29/1.5 baseline 0.7 and potential urine source for sepsis Also found to be in acute kidney injury and metabolic acidosis Started initially on vancomycin and Zosyn   Subjective-  fair Less confused Can tell me more clearly where she is C/o some abd pain Had 1 stool yesterday No cp No n/v tol only minimal diet  Assessment/Plan  Sepsis due to colitis?   Initially, it was thought that she might have a urinary tract infection, however her urinalysis demonstrated only 0-5 white blood cells.     vancomycin and Zosyn,on Zosyn since 1/24, patient had 2 formed bowel movements and her white count is coming down No pneumonia on chest x-ray -  CT scan of abdomen and pelvis Large stool burden throughout the colon with wall thickening and stranding about the transverse and descending colon most consistent with colitis. Patient was also found to be impacted and was manually disimpacted during this admission,  abdomen continues to be distended  , KUB on 1/30 showed persistent inflammation of the transverse colon Patient was switched to clindamycin on 2/1 - respiratory viral panel negative - PICC line placed 1/24, continue PICC line -Tells me she has been having diarrhea -some abd discomfort today so Dg acute to rule out fecal impaction  Acute toxic encephalopathy-resolved with intermittent sundowning Likely secondary to infection, acute kidney injury, colitis, elevated Dilantin level CT head on 1/24 was negative Offered to  do an MRI brain if continues, but patient has no gross focal deficits  No fever , doubt new infection , ammonia level XLIV Dilantin level supratherapeutic at 32 -->14-->7.5, corrected level is 14 -lower dose of dilantin on d/c   AKI / Metabolic acidemia, prerenal, resolving with IVF -Continue to hold thiazide diuretic and ACE inhibitor Cr improving 1.25>1.72> 1.13> 0.82> 0.6 DC IV fluids  Hypokalemia Replete and check a magnesium level in am again   Essential hypertension, with borderline hypertension -  Continue to hold antihypertensives   Seizure disorder, phenytoin level mildly elevated, which may be secondary to dehydration Dilantin level therapeutic 15.9 on admission Reduced Dilantin to off   Prediabetes,  -Continue CBG every 6  hours,-SSI, ranging between 101 20   Urinary incontinence - DC Foley catheter -needed to replace -clamp foley for sensation to void and d/c foley if no sensation   HLD  -Preadmission statin on hold   Severe obesity (BMI >= 40)    History of CVA  -Preadmission Plavix and statin on hold  Constipation Continue MiraLAX, Colace    Diet:   Dysphagia 1 (puree);Nectar-thick liquid   Proph:  lovenox  Code Status: DNR Family Communication: patient,and her son-in-law Disposition Plan: Continue MedSurg, social work consultation for placement, anticipate d/c 24 hr-36 hr to SNF Await  Dg abd   Consultants:  none  Procedures:  CT head  CT abd/pelvis  Antibiotics: Anti-infectives    Start     Dose/Rate Route Frequency Ordered Stop   08/22/15 0000  clindamycin (CLEOCIN) 300 MG capsule     300 mg Oral Every 8 hours 08/22/15 1111     08/21/15 0745  clindamycin (CLEOCIN)  capsule 300 mg     300 mg Oral 3 times per day 08/21/15 0744     08/19/15 1200  piperacillin-tazobactam (ZOSYN) IVPB 3.375 g  Status:  Discontinued     3.375 g 12.5 mL/hr over 240 Minutes Intravenous Every 8 hours 08/19/15 1102 08/21/15 0744   08/18/15 1600   metroNIDAZOLE (FLAGYL) 50 mg/ml oral suspension 500 mg  Status:  Discontinued     500 mg Oral 3 times daily 08/18/15 1240 08/19/15 1056   08/18/15 1400  metroNIDAZOLE (FLAGYL) tablet 500 mg  Status:  Discontinued     500 mg Oral 3 times per day 08/18/15 1158 08/18/15 1241   08/18/15 1400  ciprofloxacin (CIPRO) 500 MG/5ML (10%) suspension 500 mg  Status:  Discontinued     500 mg Oral 2 times daily 08/18/15 1240 08/19/15 1056   08/18/15 1200  ciprofloxacin (CIPRO) tablet 500 mg  Status:  Discontinued     500 mg Oral 2 times daily 08/18/15 1158 08/18/15 1241   08/14/15 1700  cefTRIAXone (ROCEPHIN) 1 g in dextrose 5 % 50 mL IVPB  Status:  Discontinued     1 g 100 mL/hr over 30 Minutes Intravenous Every 24 hours 08/13/15 1628 08/14/15 1026   08/14/15 1200  vancomycin (VANCOCIN) 1,250 mg in sodium chloride 0.9 % 250 mL IVPB  Status:  Discontinued     1,250 mg 166.7 mL/hr over 90 Minutes Intravenous Every 24 hours 08/14/15 1146 08/16/15 0856   08/14/15 1200  piperacillin-tazobactam (ZOSYN) IVPB 3.375 g  Status:  Discontinued     3.375 g 12.5 mL/hr over 240 Minutes Intravenous Every 8 hours 08/14/15 1146 08/18/15 1158   08/13/15 1700  cefTRIAXone (ROCEPHIN) 2 g in dextrose 5 % 50 mL IVPB     2 g 100 mL/hr over 30 Minutes Intravenous  Once 08/13/15 1614 08/13/15 1752   08/13/15 1300  vancomycin (VANCOCIN) IVPB 1000 mg/200 mL premix  Status:  Discontinued     1,000 mg 200 mL/hr over 60 Minutes Intravenous  Once 08/13/15 1230 08/13/15 1453   08/13/15 1215  piperacillin-tazobactam (ZOSYN) IVPB 3.375 g     3.375 g 100 mL/hr over 30 Minutes Intravenous  Once 08/13/15 1209 08/13/15 1243   08/13/15 1215  vancomycin (VANCOCIN) IVPB 1000 mg/200 mL premix     1,000 mg 200 mL/hr over 60 Minutes Intravenous  Once 08/13/15 1209 08/13/15 1315         Objective: Filed Vitals:   08/21/15 1300 08/21/15 2058 08/22/15 0500 08/22/15 0503  BP: 147/54 152/45  140/47  Pulse: 88 89  83  Temp: 98.9 F (37.2 C)  100.2 F (37.9 C)  98.2 F (36.8 C)  TempSrc: Oral Oral  Oral  Resp: 18 18  18   Height:      Weight:   112.1 kg (247 lb 2.2 oz)   SpO2: 95% 95%  96%    Intake/Output Summary (Last 24 hours) at 08/22/15 1209 Last data filed at 08/22/15 0900  Gross per 24 hour  Intake    520 ml  Output   1350 ml  Net   -830 ml   Filed Weights   08/20/15 0500 08/21/15 0500 08/22/15 0500  Weight: 115 kg (253 lb 8.5 oz) 113.4 kg (250 lb) 112.1 kg (247 lb 2.2 oz)   Body mass index is 43.79 kg/(m^2).  Exam:   General:  No acute distress, adult female  HEENT:  NCAT, MMM, no nuchal rigidity  Cardiovascular:  RRR, nl S1, S2 no mrg,  2+ pulses, warm extremities  Respiratory:  Wheezing slightly bilaterally, no increased WOB  Abdomen:   Hypoactive BS, moderately distended, and TTP diffusely, worse around umbilicus with some guarding, no rebound.    MSK:   Normal tone and bulk, 1+ edema of the left ankle and no right ankle edema (chronic)  Neuro:  Diffusely weak, slightly more pronounced on right side  Data Reviewed: Basic Metabolic Panel:  Recent Labs Lab 08/16/15 0425 08/16/15 1229 08/17/15 0315 08/19/15 0426 08/20/15 0445 08/21/15 0500  NA 140  --  141 141 140 139  K 3.3*  --  3.5 3.6 3.6 3.4*  CL 113*  --  110 107 105 103  CO2 20*  --  22 25 27 28   GLUCOSE 128*  --  147* 119* 122* 120*  BUN 32*  --  18 10 9 9   CREATININE 1.13*  --  0.82 0.64 0.66 0.68  CALCIUM 7.7*  --  7.9* 7.6* 7.6* 7.3*  MG  --  1.7  --   --   --   --    Liver Function Tests:  Recent Labs Lab 08/16/15 0425 08/17/15 0315 08/19/15 0426 08/20/15 0445 08/21/15 0500 08/22/15 0958  AST 77* 49* 33 36 36  --   ALT 33 32 28 31 32  --   ALKPHOS 57 68 75 95 89  --   BILITOT 1.0 0.8 0.7 0.7 0.6  --   PROT 4.9* 5.2* 4.5* 5.0* 4.8*  --   ALBUMIN 1.9* 1.8* 1.7* 1.8* 1.9* 2.0*   No results for input(s): LIPASE, AMYLASE in the last 168 hours.  Recent Labs Lab 08/18/15 1400  AMMONIA 44*   CBC:  Recent  Labs Lab 08/16/15 0425 08/17/15 0315 08/19/15 0426 08/19/15 1120 08/20/15 0445 08/21/15 0500  WBC 16.4* 15.5* 16.7*  --  13.7* 13.1*  NEUTROABS  --   --   --  13.5* 11.5*  --   HGB 9.1* 9.7* 9.7*  --  10.6* 9.1*  HCT 27.7* 28.6* 28.8*  --  31.6* 26.8*  MCV 100.0 98.6 99.3  --  99.1 99.3  PLT 161 162 168  --  185 208    Recent Results (from the past 240 hour(s))  Urine culture     Status: None   Collection Time: 08/13/15 11:02 AM  Result Value Ref Range Status   Specimen Description URINE, CATHETERIZED  Final   Special Requests Normal  Final   Culture NO GROWTH 1 DAY  Final   Report Status 08/14/2015 FINAL  Final  Blood culture (routine x 2)     Status: None   Collection Time: 08/13/15  1:57 PM  Result Value Ref Range Status   Specimen Description BLOOD RIGHT ANTECUBITAL  Final   Special Requests   Final    BOTTLES DRAWN AEROBIC AND ANAEROBIC 5CC DRAWN AFTER ABX   Culture NO GROWTH 5 DAYS  Final   Report Status 08/18/2015 FINAL  Final  Respiratory virus panel     Status: None   Collection Time: 08/13/15  4:59 PM  Result Value Ref Range Status   Source - RVPAN NASOPHARYNGEAL  Corrected   Respiratory Syncytial Virus A Negative Negative Final   Respiratory Syncytial Virus B Negative Negative Final   Influenza A Negative Negative Final   Influenza B Negative Negative Final   Parainfluenza 1 Negative Negative Final   Parainfluenza 2 Negative Negative Final   Parainfluenza 3 Negative Negative Final   Metapneumovirus Negative Negative Final  Rhinovirus Negative Negative Final   Adenovirus Negative Negative Final    Comment: (NOTE) Performed At: [facility] [place], [place] [id number] Lindon Romp MD [id number]   MRSA PCR Screening     Status: None   Collection Time: 08/13/15  4:59 PM  Result Value Ref Range Status   MRSA by PCR NEGATIVE NEGATIVE Final    Comment:        The GeneXpert MRSA Assay (FDA approved for NASAL specimens only),  is one component of a comprehensive MRSA colonization surveillance program. It is not intended to diagnose MRSA infection nor to guide or monitor treatment for MRSA infections.   Blood culture (routine x 2)     Status: None   Collection Time: 08/13/15  7:20 PM  Result Value Ref Range Status   Specimen Description BLOOD LEFT HAND  Final   Special Requests IN PEDIATRIC BOTTLE 2CC  Final   Culture NO GROWTH 5 DAYS  Final   Report Status 08/18/2015 FINAL  Final  Culture, blood (single) w Reflex to ID Panel     Status: None   Collection Time: 08/14/15  1:39 PM  Result Value Ref Range Status   Specimen Description BLOOD LEFT ANTECUBITAL  Final   Special Requests IN PEDIATRIC BOTTLE 3CC  Final   Culture NO GROWTH 5 DAYS  Final   Report Status 08/19/2015 FINAL  Final  Gastrointestinal Panel by PCR , Stool     Status: None   Collection Time: 08/20/15 10:30 AM  Result Value Ref Range Status   Campylobacter species NOT DETECTED NOT DETECTED Final   Plesimonas shigelloides NOT DETECTED NOT DETECTED Final   Salmonella species NOT DETECTED NOT DETECTED Final   Yersinia enterocolitica NOT DETECTED NOT DETECTED Final   Vibrio species NOT DETECTED NOT DETECTED Final   Vibrio cholerae NOT DETECTED NOT DETECTED Final   Enteroaggregative E coli (EAEC) NOT DETECTED NOT DETECTED Final   Enteropathogenic E coli (EPEC) NOT DETECTED NOT DETECTED Final   Enterotoxigenic E coli (ETEC) NOT DETECTED NOT DETECTED Final   Shiga like toxin producing E coli (STEC) NOT DETECTED NOT DETECTED Final   E. coli O157 NOT DETECTED NOT DETECTED Final   Shigella/Enteroinvasive E coli (EIEC) NOT DETECTED NOT DETECTED Final   Cryptosporidium NOT DETECTED NOT DETECTED Final   Cyclospora cayetanensis NOT DETECTED NOT DETECTED Final   Entamoeba histolytica NOT DETECTED NOT DETECTED Final   Giardia lamblia NOT DETECTED NOT DETECTED Final   Adenovirus F40/41 NOT DETECTED NOT DETECTED Final   Astrovirus NOT DETECTED NOT  DETECTED Final   Norovirus GI/GII NOT DETECTED NOT DETECTED Final   Rotavirus A NOT DETECTED NOT DETECTED Final   Sapovirus (I, II, IV, and V) NOT DETECTED NOT DETECTED Final     Studies: No results found.  Scheduled Meds: . clindamycin  300 mg Oral 3 times per day  . enoxaparin (LOVENOX) injection  0.5 mg/kg Subcutaneous Q24H  . insulin aspart  0-9 Units Subcutaneous 6 times per day  . phenytoin  100 mg Oral BID  . senna-docusate  1 tablet Oral BID  . sodium chloride flush  10-40 mL Intracatheter Q12H  . sodium chloride flush  3 mL Intravenous Q12H   Continuous Infusions:    Principal Problem:   Sepsis due to urinary tract infection (HCC) Active Problems:   Essential hypertension   HLD (hyperlipidemia)   Severe obesity (BMI >= 40) (HCC)   Seizure disorder (HCC)   Urinary incontinence   History  of CVA (cerebrovascular accident)   AKI (acute kidney injury) ([place])   Metabolic acidemia   Sepsis ([place])   Abdominal distension   Dyspnea   Encounter for palliative care   Goals of care, counseling/discussion   Abdominal pain    Time spent: 30 min    Verneita Griffes, MD [facility] [phone]

## 2015-08-22 NOTE — Progress Notes (Signed)
Foley was clamped from 4:15 pm to 7:15 and patient had no sensation to void during that time. 400 ml out after unclamped.

## 2015-08-22 NOTE — Care Management Important Message (Signed)
Important Message  Patient Details  Name: Carol Page MRN: [id number] Date of Birth: 23-Dec-1935   Medicare Important Message Given:  Yes    Diesha Rostad P Paxton 08/22/2015, 1:11 PM

## 2015-08-23 DIAGNOSIS — E440 Moderate protein-calorie malnutrition: Secondary | ICD-10-CM | POA: Diagnosis not present

## 2015-08-23 DIAGNOSIS — S42309D Unspecified fracture of shaft of humerus, unspecified arm, subsequent encounter for fracture with routine healing: Secondary | ICD-10-CM | POA: Diagnosis not present

## 2015-08-23 DIAGNOSIS — R32 Unspecified urinary incontinence: Secondary | ICD-10-CM | POA: Diagnosis not present

## 2015-08-23 DIAGNOSIS — N390 Urinary tract infection, site not specified: Secondary | ICD-10-CM | POA: Diagnosis not present

## 2015-08-23 DIAGNOSIS — R569 Unspecified convulsions: Secondary | ICD-10-CM | POA: Diagnosis not present

## 2015-08-23 DIAGNOSIS — R29898 Other symptoms and signs involving the musculoskeletal system: Secondary | ICD-10-CM | POA: Diagnosis not present

## 2015-08-23 DIAGNOSIS — R531 Weakness: Secondary | ICD-10-CM | POA: Diagnosis not present

## 2015-08-23 DIAGNOSIS — D6489 Other specified anemias: Secondary | ICD-10-CM | POA: Diagnosis not present

## 2015-08-23 DIAGNOSIS — E785 Hyperlipidemia, unspecified: Secondary | ICD-10-CM | POA: Diagnosis not present

## 2015-08-23 DIAGNOSIS — A419 Sepsis, unspecified organism: Secondary | ICD-10-CM | POA: Diagnosis not present

## 2015-08-23 DIAGNOSIS — Z9181 History of falling: Secondary | ICD-10-CM | POA: Diagnosis not present

## 2015-08-23 DIAGNOSIS — F418 Other specified anxiety disorders: Secondary | ICD-10-CM | POA: Diagnosis not present

## 2015-08-23 DIAGNOSIS — G92 Toxic encephalopathy: Secondary | ICD-10-CM | POA: Diagnosis not present

## 2015-08-23 DIAGNOSIS — R2689 Other abnormalities of gait and mobility: Secondary | ICD-10-CM | POA: Diagnosis not present

## 2015-08-23 DIAGNOSIS — I10 Essential (primary) hypertension: Secondary | ICD-10-CM | POA: Diagnosis not present

## 2015-08-23 DIAGNOSIS — Z8673 Personal history of transient ischemic attack (TIA), and cerebral infarction without residual deficits: Secondary | ICD-10-CM | POA: Diagnosis not present

## 2015-08-23 DIAGNOSIS — M6281 Muscle weakness (generalized): Secondary | ICD-10-CM | POA: Diagnosis not present

## 2015-08-23 DIAGNOSIS — R338 Other retention of urine: Secondary | ICD-10-CM | POA: Diagnosis not present

## 2015-08-23 DIAGNOSIS — N179 Acute kidney failure, unspecified: Secondary | ICD-10-CM | POA: Diagnosis not present

## 2015-08-23 LAB — GLUCOSE, CAPILLARY
GLUCOSE-CAPILLARY: 122 mg/dL — AB (ref 65–99)
Glucose-Capillary: 99 mg/dL (ref 65–99)
Glucose-Capillary: 103 mg/dL — ABNORMAL HIGH (ref 65–99)

## 2015-08-23 MED ORDER — LORAZEPAM 1 MG PO TABS: ORAL_TABLET | ORAL | Status: DC

## 2015-08-23 MED ORDER — GABAPENTIN 300 MG PO CAPS: 300.0000 mg | ORAL_CAPSULE | Freq: Two times a day (BID) | ORAL | Status: DC

## 2015-08-23 MED ORDER — LACTULOSE 10 GM/15ML PO SOLN: 10.0000 g | Freq: Every day | ORAL | Status: DC

## 2015-08-23 NOTE — Progress Notes (Signed)
Pt picked up by [facility] to be transported to disposition. Pt transported off unit via stretcher with belongings to the side. Francis [place] Givanni Staron RN.

## 2015-08-23 NOTE — Clinical Social Work Placement (Signed)
   CLINICAL SOCIAL WORK PLACEMENT  NOTE  Date:  08/23/2015  Patient Details  Name: Carol Page MRN: [id number] Date of Birth: Aug 18, 1935  Clinical Social Work is seeking post-discharge placement for this patient at the [place] level of care (*CSW will initial, date and re-position this form in  chart as items are completed):  Yes   Patient/family provided with [facility]'s list of facilities offering this level of care within the geographic area requested by the patient (or if unable, by the patient's family).  Yes   Patient/family informed of their freedom to choose among providers that offer the needed level of care, that participate in Medicare, Medicaid or managed care program needed by the patient, have an available bed and are willing to accept the patient.  Yes   Patient/family informed of [facility] [place]'s ownership interest in [facility] and [facility], as well as of the fact that they are under no obligation to receive care at these facilities.  PASRR submitted to EDS on 08/22/15     PASRR number received on 08/22/15     Existing PASRR number confirmed on       FL2 transmitted to all facilities in geographic area requested by pt/family on 08/22/15     FL2 transmitted to all facilities within larger geographic area on       Patient informed that his/her managed care company has contracts with or will negotiate with certain facilities, including the following:        Yes   Patient/family informed of bed offers received.  Patient chooses bed at [place], [place]     Physician recommends and patient chooses bed at      Patient to be transferred to [place] on 08/23/15.  Patient to be transferred to facility by Ambulance     Patient family notified on 08/23/15 of transfer.  Name of family member notified:  Debra     PHYSICIAN Please prepare priority discharge summary, including medications,  Please prepare prescriptions, Please sign FL2, Please sign DNR     Additional Comment:  Per MD patient ready for DC to [place]. RN, patient, patient's family, and facility notified of DC. RN given number for report. DC packet on chart. Ambulance transport will be requested by RN (number provided on packet) once patient's PICC line has been removed. CSW signing off.   _______________________________________________ Liz Beach MSW, [place], [place], [id number]

## 2015-08-23 NOTE — Progress Notes (Signed)
Pt discharge education and instructions completed; pt discharge to [facility] with [facility] to transport him to disposition. Report called off to [facility] a nurse at the facility. Pt cleaned and changed into her personal belongings; PICC line removed by IV team per protocol and order; pt foley remains clean and intact, unclamped. Pt in bed with call light within reach, awaiting on [facility] to pick her up to disposition. Will closely monitor till pt picked up. P. Amo Naim Murtha RN.

## 2015-08-23 NOTE — Clinical Social Work Note (Signed)
Transport requested for patient.  Liz Beach MSW, [place], [place], [id number]

## 2015-08-23 NOTE — Progress Notes (Signed)
Physical Therapy Treatment Patient Details Name: Carol Page MRN: [id number] DOB: 1935/11/25 Today's Date: 08/23/2015    History of Present Illness Pt. admitted 08/13/15 with sepsie due to colitis?, acute toxic encephalopathy, AKI/metabilic acedemia, large stool burden/impaction, severe obesity and chronic urinary incontinence.  Pt. with history of CVA, prediabetes, chronic leg pain, seizure disorder.  Pt. reportedly bed bound at home.      PT Comments    Patient remains at max/total A +2 for bed mobility and attempts of sit to stands. Pt agreeable to participate in PT. Pt's cognition appears to be limiting factor for progression. Continue to progress as tolerated with anticipated d/c to SNF for further skilled PT services.     Follow Up Recommendations  SNF;Supervision/Assistance - 24 hour     Equipment Recommendations  Other (comment) (TBD once DC dispo is  confirmed)    Recommendations for Other Services       Precautions / Restrictions Precautions Precautions: Fall;Other (comment) (risk of skin breakdown due to limited mobility)    Mobility  Bed Mobility Overal bed mobility: Needs Assistance Bed Mobility: Rolling;Supine to Sit;Sit to Supine Rolling: Max assist   Supine to sit: Max assist;+2 for safety/equipment Sit to supine: Max assist;+2 for physical assistance   General bed mobility comments: max verbal and tactile cues for hand placement and technique for all bed mobillity; pt able to begin rolling trunk with use of bed rail and needs max A to continue roll into sidelying; assist for elevating trunk, bring bilat LE and hips to EOB with use of bed pad; max verbal and tactile cues for hand placement once in sitting to maintain balance without assistance  Transfers Overall transfer level: Needs assistance (unable) Equipment used: Rolling walker (2 wheeled) Transfers: Sit to/from Stand Sit to Stand: Max assist;Total assist;+2 physical assistance;+2 safety/equipment          General transfer comment: attempted sit to stand X2; educated pt several times not to grab therapist or try pulling on therapist's shoulders/neck for mobility; first trial pt not assisting with bilat UE or LE despite max verbal and tactile cues for technique and pt agreeable to attempt standing; second trial pt began assisting with bilat achieving a brief squat with use of bed pad and max/total  +2; pt refused any other mobility  Ambulation/Gait             General Gait Details: unable   Stairs            Wheelchair Mobility    Modified Rankin (Stroke Patients Only)       Balance                                    Cognition Arousal/Alertness: Awake/alert Behavior During Therapy: WFL for tasks assessed/performed Overall Cognitive Status: No family/caregiver present to determine baseline cognitive functioning       Memory: Decreased recall of precautions;Decreased short-term memory              Exercises      General Comments        Pertinent Vitals/Pain Pain Assessment: Faces Faces Pain Scale: Hurts a little bit Pain Location: "all over" Pain Descriptors / Indicators: Other (Comment);Moaning ("hurts") Pain Intervention(s): Monitored during session;Premedicated before session    Home Living                      Prior Function  PT Goals (current goals can now be found in the care plan section) Acute Rehab PT Goals Patient Stated Goal: none stated PT Goal Formulation: Patient unable to participate in goal setting Time For Goal Achievement: 08/31/15 Potential to Achieve Goals: Fair Progress towards PT goals: Not progressing toward goals - comment    Frequency  Other (Comment) (trial of PT on 2x/ week basis to see if she can progress)    PT Plan Current plan remains appropriate    Co-evaluation             End of Session Equipment Utilized During Treatment: Gait belt Activity Tolerance: Patient  limited by pain Patient left: in bed;with call bell/phone within reach;with bed alarm set     Time: 0944-1000 PT Time Calculation (min) (ACUTE ONLY): 16 min  Charges:  $Therapeutic Activity: 8-22 mins                    G Codes:      Salina April, PTA Pager: [phone]   08/23/2015, 12:06 PM

## 2015-08-23 NOTE — Discharge Summary (Addendum)
Physician Discharge Summary  Carol Page [id number] DOB: 25-Dec-1935 DOA: 08/13/2015  PCP: Webb Silversmith, NP  Admit date: 08/13/2015 Discharge date: 08/23/2015  Time spent: 35 minutes  Recommendations for Outpatient Follow-up:  1. Please look at section detailed under polypharmacy but briefly feel like patient should cut back significantly on multiple sedating medications  2. Discontinue HCTZ this admission 3. Continue to thicken liquids 4. Completed a total of 8 days of antibiotics from 1/24-2/3 so does not need further antibiotics moving forward 5. Adjust dose of lactulose if excessive diarrhea and may substitute sorbitol for this but keep on a regular bowel regimen 6. Consider HbA1c as an outpatient and dietary monitoring as well as adjustment 7. Patient will need to discharge with Foley catheter in situ and would recommend discussion as an outpatient with urology regarding further plans  Discharge Diagnoses:  Principal Problem:   Sepsis due to urinary tract infection ([place]) Active Problems:   Essential hypertension   HLD (hyperlipidemia)   Severe obesity (BMI >= 40) (HCC)   Seizure disorder (HCC)   Urinary incontinence   History of CVA (cerebrovascular accident)   AKI (acute kidney injury) ([place])   Metabolic acidemia   Sepsis ([place])   Abdominal distension   Dyspnea   Encounter for palliative care   Goals of care, counseling/discussion   Abdominal pain   Discharge Condition: Improved  Diet recommendation: Heart healthy low-salt rec dysphagia 2 diet, chopped  Filed Weights   08/21/15 0500 08/22/15 0500 08/23/15 0547  Weight: 113.4 kg (250 lb) 112.1 kg (247 lb 2.2 oz) 109.9 kg (242 lb 4.6 oz)    History of present illness:  80 year old female Prior CVA with residual memory loss Prediabetes Body mass index is 43.79 kg/(m^2). Seizure disorder Bipolar Prediabetes Prior admissions for pyelonephritis history of metabolic encephalopathy in the past on  admission  Admitted to [facility] 1/24th record temp 102 sinus tachycardiac, WBC 16, BUN/creatinine 29/1.5 baseline 0.7 and potential urine source for sepsis Also found to be in acute kidney injury and metabolic acidosis Started initially on vancomycin and [place] Hospital Course:  Sepsis due to colitis?  Initially, it was thought that she might have a urinary tract infection, however her urinalysis demonstrated only 0-5 white blood cells.  vancomycin and Zosyn,on Zosyn since 1/24, patient had 2 formed bowel movements and her white count is coming down No pneumonia on chest x-ray - CT scan of abdomen and pelvis Large stool burden throughout the colon with wall thickening and stranding about the transverse and descending colon most consistent with colitis. Patient was also found to be impacted and was manually disimpacted during this admission, Is chronically  distended , KUB on 1/30 showed persistent inflammation of the transverse colon Patient was switched to clindamycin on 2/1 - respiratory viral panel negative - PICC line placed 1/24, continue PICC line -further xrays showed residual stool -should continue on scheduled Lactulose or sorbitol daily as an OP  Acute toxic encephalopathy-resolved with intermittent sundowning Likely secondary to infection, acute kidney injury, colitis, elevated Dilantin level CT head on 1/24 was negative Offered to do an MRI brain if continues, but patient has no gross focal deficits  No fever , doubt new infection , ammonia level XLIV Dilantin level supratherapeutic at 32 -->14-->7.5, corrected level is 14 -lower dose of dilantin on d/c to 100 mg daily -was on gabapentin pta whioch was cut back to Gabapentin bid only -take Ativan 0.5 daily prn only -consider cutting back trazadone at night slowly as  OP  AKI / Metabolic acidemia, prerenal, resolving with IVF -Continue to hold thiazide diuretic and ACE inhibitor Cr improving 1.25>1.72>  1.13> 0.82> 0.6 DC IV fluids  Hypokalemia Repleted during hopstial stay  Essential hypertension, with borderline hypertension Continue to hold antihypertensives   Seizure disorder, phenytoin level mildly elevated, which may be secondary to dehydration Dilantin level therapeutic 15.9 on admission Reduced Dilantin to off 100 daily   Prediabetes,  -Continue CBG every 6 hours,-SSI, ranging between 101 20 Will need OP work-up  Urinary incontinence - DC Foley catheter -needed to replace -needs outpatient urology input   HLD  -Preadmission statin on hold   Severe obesity (BMI >= 40)    History of CVA  -Preadmission Plavix and statin on hold during hospital stay This was resumed on discharge  Constipation Continue lactulose once daily, Colace was discontinued in favor of senna    Discharge Exam: Filed Vitals:   08/22/15 2039 08/23/15 0404  BP: 149/59 149/67  Pulse: 81 76  Temp: 98.9 F (37.2 C) 99.1 F (37.3 C)  Resp: 18 16    General: Alert pleasant oriented Cardiovascular: S1-S2 no murmur rub or gallop Respiratory: Clinically clear no added sound Soft nontender but somewhat distended no rebound no guarding  Discharge Instructions   Discharge Instructions    Diet - low sodium heart healthy    Complete by:  As directed      Discharge patient    Complete by:  As directed   To SNF     Increase activity slowly    Complete by:  As directed           Current Discharge Medication List    START taking these medications   Details  clindamycin (CLEOCIN) 300 MG capsule Take 1 capsule (300 mg total) by mouth every 8 (eight) hours. Qty: 6 capsule, Refills: 0    food thickener (THICK IT) POWD Take 227 g by mouth as needed (DYS 1/ nector thick diet). Refills: 0    lactulose (CHRONULAC) 10 GM/15ML solution Take 15 mLs (10 g total) by mouth daily. Qty: 240 mL, Refills: 0    phenytoin (DILANTIN) 125 MG/5ML suspension Take 4 mLs (100 mg total) by mouth  daily. Qty: 237 mL, Refills: 12      CONTINUE these medications which have CHANGED   Details  LORazepam (ATIVAN) 1 MG tablet TAKE ONE TABLET BY MOUTH ONCE DAILY AS NEEDED Qty: 30 tablet, Refills: 0      CONTINUE these medications which have NOT CHANGED   Details  acetaminophen (TYLENOL) 500 MG tablet Take 1,000 mg by mouth every 8 (eight) hours.     amitriptyline (ELAVIL) 25 MG tablet Take 1 tablet (25 mg total) by mouth at bedtime. Qty: 30 tablet, Refills: 5    atorvastatin (LIPITOR) 80 MG tablet TAKE ONE TABLET BY MOUTH ONCE DAILY Qty: 30 tablet, Refills: 5    CARTIA XT 180 MG 24 hr capsule TAKE ONE CAPSULE BY MOUTH ONCE DAILY Qty: 30 capsule, Refills: 5    clopidogrel (PLAVIX) 75 MG tablet TAKE ONE TABLET BY MOUTH IN THE EVENING Qty: 30 tablet, Refills: 5    gabapentin (NEURONTIN) 100 MG capsule Take 200-300 mg by mouth 3 (three) times daily. Pt takes 300mg  in am, 200mg  at 1400 and 200mg  at bedtime    hydrochlorothiazide (MICROZIDE) 12.5 MG capsule TAKE ONE CAPSULE BY MOUTH ONCE DAILY Qty: 30 capsule, Refills: 5    traZODone (DESYREL) 50 MG tablet TAKE THREE TABLETS BY MOUTH AT  BEDTIME Qty: 90 tablet, Refills: 0    vitamin B-12 (CYANOCOBALAMIN) 1000 MCG tablet Take 1,000 mcg by mouth daily.    escitalopram (LEXAPRO) 10 MG tablet TAKE ONE TABLET BY MOUTH ONCE DAILY Qty: 30 tablet, Refills: 3    lisinopril (PRINIVIL,ZESTRIL) 10 MG tablet TAKE ONE TABLET BY MOUTH ONCE DAILY Qty: 30 tablet, Refills: 5    oxybutynin (DITROPAN) 5 MG tablet TAKE ONE TABLET BY MOUTH THREE TIMES DAILY Qty: 90 tablet, Refills: 5      STOP taking these medications     nitroGLYCERIN (NITROSTAT) 0.4 MG SL tablet      phenytoin (DILANTIN) 100 MG ER capsule      nitrofurantoin, macrocrystal-monohydrate, (MACROBID) 100 MG capsule      phenytoin (DILANTIN) 100 MG ER capsule        Allergies  Allergen Reactions  . Codeine Nausea And Vomiting  . Ether Nausea And Vomiting      The  results of significant diagnostics from this hospitalization (including imaging, microbiology, ancillary and laboratory) are listed below for reference.    Significant Diagnostic Studies: Ct Head Wo Contrast  08/13/2015  CLINICAL DATA:  Found down and unresponsive. Bilateral leg weakness with sluggish pupil response. EXAM: CT HEAD WITHOUT CONTRAST TECHNIQUE: Contiguous axial images were obtained from the base of the skull through the vertex without intravenous contrast. COMPARISON:  Head CT 05/04/2015 and 04/08/2014. FINDINGS: There is no evidence of acute intracranial hemorrhage, mass lesion, brain edema or extra-axial fluid collection. The ventricles and subarachnoid spaces are prominent but stable. There is no CT evidence of acute cortical infarction. There are stable chronic small vessel ischemic changes in the periventricular white matter and basal ganglia bilaterally. There is a small mucous retention cyst inferiorly in the right maxillary sinus. There is chronic partial left mastoid air cell opacification. The visualized paranasal sinuses, mastoid air cells and middle ears are otherwise clear. The calvarium is intact. IMPRESSION: Stable head CT demonstrating atrophy and chronic small vessel ischemic changes. No generalized cerebral edema or acute focal abnormality identified. Electronically Signed   By: Richardean Sale M.D.   On: 08/13/2015 11:58   Ct Abdomen Pelvis W Contrast  08/14/2015  CLINICAL DATA:  Abdominal pain, fever and distension today. Initial encounter. EXAM: CT ABDOMEN AND PELVIS WITH CONTRAST TECHNIQUE: Multidetector CT imaging of the abdomen and pelvis was performed using the standard protocol following bolus administration of intravenous contrast. CONTRAST:  75 mL OMNIPAQUE IOHEXOL 300 MG/ML  SOLN COMPARISON:  None. FINDINGS: Dependent atelectasis is seen in the lung bases. Heart size is upper normal. No pleural or pericardial effusion. A 0.5 cm hypo attenuating lesion in the right  lobe of the liver on image 30 cannot be definitively characterized but is likely a cyst. The liver is otherwise unremarkable. The gallbladder is mildly distended but otherwise appears normal. The adrenal glands and spleen appear normal. There is some fatty atrophy of the pancreas without focal lesion. The left kidney appears normal. There is a small right renal cyst. The right kidney is otherwise unremarkable. Extensive aortoiliac atherosclerosis without aneurysm is identified. Foley catheter is in place in a decompressed urinary bladder. Uterus and adnexa are unremarkable. There is a large volume of stool throughout the colon, particularly the transverse and descending colon. The walls of the transverse colon are thickened with surrounding stranding. A small amount of interloop fluid is present centered about the transverse and descending colon. No portal venous gas, free intraperitoneal air or pneumatosis is identified. No obstructing colonic  lesion is seen. The patient has a small hiatal hernia. This stomach is otherwise unremarkable. Small bowel loops appear normal. There is no lymphadenopathy or fluid collection. The patient has multilevel lumbar spondylosis. Chronic bilateral L5 pars interarticularis defects result in 1.4 cm anterolisthesis L5 on S1. There is convex left scoliosis. IMPRESSION: Large stool burden throughout the colon with wall thickening and stranding about the transverse and descending colon most consistent with colitis. No CT signs of ischemia are identified and no obstructing mass is seen. Extensive atherosclerosis without aneurysm. Marked multilevel lumbar spondylosis with bilateral L5 pars interarticularis defects resulting in 1.4 cm anterolisthesis. Electronically Signed   By: Inge Rise M.D.   On: 08/14/2015 18:18   Dg Chest Port 1 View  08/19/2015  CLINICAL DATA:  Fever. EXAM: PORTABLE CHEST 1 VIEW COMPARISON:  August 14, 2015. FINDINGS: Stable cardiomediastinal silhouette.  Hypoinflation of the lungs is noted. Mild bibasilar subsegmental atelectasis is noted. Right-sided PICC line is noted with distal tip in expected position of SVC. No pneumothorax or significant pleural effusion is noted. Bony thorax is unremarkable. IMPRESSION: Hypoinflation of the lungs with mild bibasilar subsegmental atelectasis. Electronically Signed   By: Marijo Conception, M.D.   On: 08/19/2015 11:00   Dg Chest Port 1 View  08/14/2015  CLINICAL DATA:  Dyspnea.  Wheezing.  Fever. EXAM: PORTABLE CHEST 1 VIEW COMPARISON:  08/13/2015. FINDINGS: Grossly stable enlarged cardiac silhouette. A poor inspiration is again demonstrated. Mildly increased bibasilar atelectasis. The interstitial markings remain diffusely prominent. Right PICC tip at the superior cavoatrial junction. Mild scoliosis. IMPRESSION: 1. Increased bibasilar atelectasis. 2. Stable mild cardiomegaly and mild chronic interstitial lung disease. Electronically Signed   By: Claudie Revering M.D.   On: 08/14/2015 11:25   Dg Chest Port 1 View  08/13/2015  CLINICAL DATA:  Altered mental status, tachycardia EXAM: PORTABLE CHEST 1 VIEW COMPARISON:  04/16/2014 FINDINGS: Cardiomediastinal silhouette is stable. Study is limited by poor inspiration. Stable bilateral basilar atelectasis or scarring. No segmental infiltrate or pulmonary edema. IMPRESSION: No infiltrate or pulmonary edema. Limited study by poor inspiration. Stable bilateral basilar atelectasis or scarring. Electronically Signed   By: Lahoma Crocker M.D.   On: 08/13/2015 11:08   Dg Abd 2 Views  08/22/2015  CLINICAL DATA:  Right-sided abdominal pain and decreased stool caliber. EXAM: ABDOMEN - 2 VIEW COMPARISON:  08/19/2015 and CT 08/14/2015 FINDINGS: Exam demonstrates mild fecal retention over the rectum. Air is present throughout the remainder of the colon. There is no evidence of bowel obstruction. No evidence of free peritoneal air. There are a few scattered colonic air-fluid levels on the decubitus  film. There are degenerative changes of the spine and hips. IMPRESSION: Mild fecal retention over the rectum. Bowel gas pattern is otherwise nonobstructive. Electronically Signed   By: Marin Olp M.D.   On: 08/22/2015 13:22   Dg Abd Portable 1v  08/19/2015  CLINICAL DATA:  Acute abdominal pain.  Constipation. EXAM: PORTABLE ABDOMEN - 1 VIEW COMPARISON:  08/14/2015 FINDINGS: Relatively featureless loop of transverse colon again noted, consistent with colitis. Appearance of the proximal and distal colon is normal. No evidence for free intraperitoneal air on this supine view of the abdomen. IMPRESSION: Persistent changes suggesting inflammation of the transverse colon. Electronically Signed   By: Nolon Nations M.D.   On: 08/19/2015 10:52    Microbiology: Recent Results (from the past 240 hour(s))  Urine culture     Status: None   Collection Time: 08/13/15 11:02 AM  Result Value  Ref Range Status   Specimen Description URINE, CATHETERIZED  Final   Special Requests Normal  Final   Culture NO GROWTH 1 DAY  Final   Report Status 08/14/2015 FINAL  Final  Blood culture (routine x 2)     Status: None   Collection Time: 08/13/15  1:57 PM  Result Value Ref Range Status   Specimen Description BLOOD RIGHT ANTECUBITAL  Final   Special Requests   Final    BOTTLES DRAWN AEROBIC AND ANAEROBIC 5CC DRAWN AFTER ABX   Culture NO GROWTH 5 DAYS  Final   Report Status 08/18/2015 FINAL  Final  Respiratory virus panel     Status: None   Collection Time: 08/13/15  4:59 PM  Result Value Ref Range Status   Source - RVPAN NASOPHARYNGEAL  Corrected   Respiratory Syncytial Virus A Negative Negative Final   Respiratory Syncytial Virus B Negative Negative Final   Influenza A Negative Negative Final   Influenza B Negative Negative Final   Parainfluenza 1 Negative Negative Final   Parainfluenza 2 Negative Negative Final   Parainfluenza 3 Negative Negative Final   Metapneumovirus Negative Negative Final    Rhinovirus Negative Negative Final   Adenovirus Negative Negative Final    Comment: (NOTE) Performed At: [facility] [place], [place] [id number] Lindon Romp MD [id number]   MRSA PCR Screening     Status: None   Collection Time: 08/13/15  4:59 PM  Result Value Ref Range Status   MRSA by PCR NEGATIVE NEGATIVE Final    Comment:        The GeneXpert MRSA Assay (FDA approved for NASAL specimens only), is one component of a comprehensive MRSA colonization surveillance program. It is not intended to diagnose MRSA infection nor to guide or monitor treatment for MRSA infections.   Blood culture (routine x 2)     Status: None   Collection Time: 08/13/15  7:20 PM  Result Value Ref Range Status   Specimen Description BLOOD LEFT HAND  Final   Special Requests IN PEDIATRIC BOTTLE 2CC  Final   Culture NO GROWTH 5 DAYS  Final   Report Status 08/18/2015 FINAL  Final  Culture, blood (single) w Reflex to ID Panel     Status: None   Collection Time: 08/14/15  1:39 PM  Result Value Ref Range Status   Specimen Description BLOOD LEFT ANTECUBITAL  Final   Special Requests IN PEDIATRIC BOTTLE 3CC  Final   Culture NO GROWTH 5 DAYS  Final   Report Status 08/19/2015 FINAL  Final  Gastrointestinal Panel by PCR , Stool     Status: None   Collection Time: 08/20/15 10:30 AM  Result Value Ref Range Status   Campylobacter species NOT DETECTED NOT DETECTED Final   Plesimonas shigelloides NOT DETECTED NOT DETECTED Final   Salmonella species NOT DETECTED NOT DETECTED Final   Yersinia enterocolitica NOT DETECTED NOT DETECTED Final   Vibrio species NOT DETECTED NOT DETECTED Final   Vibrio cholerae NOT DETECTED NOT DETECTED Final   Enteroaggregative E coli (EAEC) NOT DETECTED NOT DETECTED Final   Enteropathogenic E coli (EPEC) NOT DETECTED NOT DETECTED Final   Enterotoxigenic E coli (ETEC) NOT DETECTED NOT DETECTED Final   Shiga like toxin producing E coli (STEC) NOT DETECTED  NOT DETECTED Final   E. coli O157 NOT DETECTED NOT DETECTED Final   Shigella/Enteroinvasive E coli (EIEC) NOT DETECTED NOT DETECTED Final   Cryptosporidium NOT DETECTED NOT DETECTED Final   Cyclospora  cayetanensis NOT DETECTED NOT DETECTED Final   Entamoeba histolytica NOT DETECTED NOT DETECTED Final   Giardia lamblia NOT DETECTED NOT DETECTED Final   Adenovirus F40/41 NOT DETECTED NOT DETECTED Final   Astrovirus NOT DETECTED NOT DETECTED Final   Norovirus GI/GII NOT DETECTED NOT DETECTED Final   Rotavirus A NOT DETECTED NOT DETECTED Final   Sapovirus (I, II, IV, and V) NOT DETECTED NOT DETECTED Final     Labs: Basic Metabolic Panel:  Recent Labs Lab 08/16/15 1229 08/17/15 0315 08/19/15 0426 08/20/15 0445 08/21/15 0500 08/22/15 1410  NA  --  141 141 140 139 138  K  --  3.5 3.6 3.6 3.4* 3.7  CL  --  110 107 105 103 103  CO2  --  22 25 27 28 26   GLUCOSE  --  147* 119* 122* 120* 114*  BUN  --  18 10 9 9 6   CREATININE  --  0.82 0.64 0.66 0.68 0.59  CALCIUM  --  7.9* 7.6* 7.6* 7.3* 7.5*  MG 1.7  --   --   --   --   --    Liver Function Tests:  Recent Labs Lab 08/17/15 0315 08/19/15 0426 08/20/15 0445 08/21/15 0500 08/22/15 0958  AST 49* 33 36 36  --   ALT 32 28 31 32  --   ALKPHOS 68 75 95 89  --   BILITOT 0.8 0.7 0.7 0.6  --   PROT 5.2* 4.5* 5.0* 4.8*  --   ALBUMIN 1.8* 1.7* 1.8* 1.9* 2.0*   No results for input(s): LIPASE, AMYLASE in the last 168 hours.  Recent Labs Lab 08/18/15 1400  AMMONIA 44*   CBC:  Recent Labs Lab 08/17/15 0315 08/19/15 0426 08/19/15 1120 08/20/15 0445 08/21/15 0500 08/22/15 1410  WBC 15.5* 16.7*  --  13.7* 13.1* 9.3  NEUTROABS  --   --  13.5* 11.5*  --  7.4  HGB 9.7* 9.7*  --  10.6* 9.1* 9.3*  HCT 28.6* 28.8*  --  31.6* 26.8* 27.4*  MCV 98.6 99.3  --  99.1 99.3 98.9  PLT 162 168  --  185 208 235   Cardiac Enzymes: No results for input(s): CKTOTAL, CKMB, CKMBINDEX, TROPONINI in the last 168 hours. BNP: BNP (last 3  results) No results for input(s): BNP in the last 8760 hours.  ProBNP (last 3 results) No results for input(s): PROBNP in the last 8760 hours.  CBG:  Recent Labs Lab 08/22/15 1125 08/22/15 1615 08/22/15 2038 08/22/15 2358 08/23/15 0359  GLUCAP 105* 109* 105* 122* 99       Signed:  Nita Sells MD.  [facility] 08/23/2015, 9:37 AM

## 2015-08-25 DIAGNOSIS — D6489 Other specified anemias: Secondary | ICD-10-CM | POA: Diagnosis not present

## 2015-08-25 DIAGNOSIS — A419 Sepsis, unspecified organism: Secondary | ICD-10-CM | POA: Diagnosis not present

## 2015-08-25 DIAGNOSIS — R569 Unspecified convulsions: Secondary | ICD-10-CM | POA: Diagnosis not present

## 2015-08-25 DIAGNOSIS — G92 Toxic encephalopathy: Secondary | ICD-10-CM | POA: Diagnosis not present

## 2015-08-25 DIAGNOSIS — I10 Essential (primary) hypertension: Secondary | ICD-10-CM | POA: Diagnosis not present

## 2015-08-25 DIAGNOSIS — R531 Weakness: Secondary | ICD-10-CM | POA: Diagnosis not present

## 2015-08-25 DIAGNOSIS — E440 Moderate protein-calorie malnutrition: Secondary | ICD-10-CM | POA: Diagnosis not present

## 2015-09-30 DIAGNOSIS — R338 Other retention of urine: Secondary | ICD-10-CM | POA: Diagnosis not present

## 2015-10-07 DIAGNOSIS — I69311 Memory deficit following cerebral infarction: Secondary | ICD-10-CM | POA: Diagnosis not present

## 2015-10-07 DIAGNOSIS — F418 Other specified anxiety disorders: Secondary | ICD-10-CM | POA: Diagnosis not present

## 2015-10-07 DIAGNOSIS — E785 Hyperlipidemia, unspecified: Secondary | ICD-10-CM | POA: Diagnosis not present

## 2015-10-07 DIAGNOSIS — Z9181 History of falling: Secondary | ICD-10-CM | POA: Diagnosis not present

## 2015-10-07 DIAGNOSIS — I10 Essential (primary) hypertension: Secondary | ICD-10-CM | POA: Diagnosis not present

## 2015-10-07 DIAGNOSIS — G40909 Epilepsy, unspecified, not intractable, without status epilepticus: Secondary | ICD-10-CM | POA: Diagnosis not present

## 2015-10-07 DIAGNOSIS — E43 Unspecified severe protein-calorie malnutrition: Secondary | ICD-10-CM | POA: Diagnosis not present

## 2015-10-07 DIAGNOSIS — G894 Chronic pain syndrome: Secondary | ICD-10-CM | POA: Diagnosis not present

## 2015-10-07 DIAGNOSIS — F329 Major depressive disorder, single episode, unspecified: Secondary | ICD-10-CM | POA: Diagnosis not present

## 2015-10-07 DIAGNOSIS — R7303 Prediabetes: Secondary | ICD-10-CM | POA: Diagnosis not present

## 2015-10-07 DIAGNOSIS — R1310 Dysphagia, unspecified: Secondary | ICD-10-CM | POA: Diagnosis not present

## 2015-10-07 DIAGNOSIS — Z87440 Personal history of urinary (tract) infections: Secondary | ICD-10-CM | POA: Diagnosis not present

## 2015-10-09 DIAGNOSIS — R1310 Dysphagia, unspecified: Secondary | ICD-10-CM | POA: Diagnosis not present

## 2015-10-09 DIAGNOSIS — I69311 Memory deficit following cerebral infarction: Secondary | ICD-10-CM | POA: Diagnosis not present

## 2015-10-09 DIAGNOSIS — G40909 Epilepsy, unspecified, not intractable, without status epilepticus: Secondary | ICD-10-CM | POA: Diagnosis not present

## 2015-10-09 DIAGNOSIS — G894 Chronic pain syndrome: Secondary | ICD-10-CM | POA: Diagnosis not present

## 2015-10-09 DIAGNOSIS — I10 Essential (primary) hypertension: Secondary | ICD-10-CM | POA: Diagnosis not present

## 2015-10-09 DIAGNOSIS — R7303 Prediabetes: Secondary | ICD-10-CM | POA: Diagnosis not present

## 2015-10-10 ENCOUNTER — Ambulatory Visit: Payer: Self-pay | Admitting: Internal Medicine

## 2015-10-14 DIAGNOSIS — G40909 Epilepsy, unspecified, not intractable, without status epilepticus: Secondary | ICD-10-CM | POA: Diagnosis not present

## 2015-10-14 DIAGNOSIS — R7303 Prediabetes: Secondary | ICD-10-CM | POA: Diagnosis not present

## 2015-10-14 DIAGNOSIS — R1310 Dysphagia, unspecified: Secondary | ICD-10-CM | POA: Diagnosis not present

## 2015-10-14 DIAGNOSIS — G894 Chronic pain syndrome: Secondary | ICD-10-CM | POA: Diagnosis not present

## 2015-10-14 DIAGNOSIS — I69311 Memory deficit following cerebral infarction: Secondary | ICD-10-CM | POA: Diagnosis not present

## 2015-10-14 DIAGNOSIS — I10 Essential (primary) hypertension: Secondary | ICD-10-CM | POA: Diagnosis not present

## 2015-10-15 DIAGNOSIS — R7303 Prediabetes: Secondary | ICD-10-CM | POA: Diagnosis not present

## 2015-10-15 DIAGNOSIS — G40909 Epilepsy, unspecified, not intractable, without status epilepticus: Secondary | ICD-10-CM | POA: Diagnosis not present

## 2015-10-15 DIAGNOSIS — I10 Essential (primary) hypertension: Secondary | ICD-10-CM | POA: Diagnosis not present

## 2015-10-15 DIAGNOSIS — I69311 Memory deficit following cerebral infarction: Secondary | ICD-10-CM | POA: Diagnosis not present

## 2015-10-15 DIAGNOSIS — R1310 Dysphagia, unspecified: Secondary | ICD-10-CM | POA: Diagnosis not present

## 2015-10-15 DIAGNOSIS — G894 Chronic pain syndrome: Secondary | ICD-10-CM | POA: Diagnosis not present

## 2015-10-16 DIAGNOSIS — I10 Essential (primary) hypertension: Secondary | ICD-10-CM | POA: Diagnosis not present

## 2015-10-16 DIAGNOSIS — G894 Chronic pain syndrome: Secondary | ICD-10-CM | POA: Diagnosis not present

## 2015-10-16 DIAGNOSIS — R1310 Dysphagia, unspecified: Secondary | ICD-10-CM | POA: Diagnosis not present

## 2015-10-16 DIAGNOSIS — I69311 Memory deficit following cerebral infarction: Secondary | ICD-10-CM | POA: Diagnosis not present

## 2015-10-16 DIAGNOSIS — G40909 Epilepsy, unspecified, not intractable, without status epilepticus: Secondary | ICD-10-CM | POA: Diagnosis not present

## 2015-10-16 DIAGNOSIS — R7303 Prediabetes: Secondary | ICD-10-CM | POA: Diagnosis not present

## 2015-10-17 ENCOUNTER — Encounter: Payer: Self-pay | Admitting: Internal Medicine

## 2015-10-17 ENCOUNTER — Ambulatory Visit (INDEPENDENT_AMBULATORY_CARE_PROVIDER_SITE_OTHER): Payer: Medicare Other | Admitting: Internal Medicine

## 2015-10-17 VITALS — BP 136/68 | HR 81 | Temp 98.9°F | Wt 221.0 lb

## 2015-10-17 DIAGNOSIS — K529 Noninfective gastroenteritis and colitis, unspecified: Secondary | ICD-10-CM

## 2015-10-17 DIAGNOSIS — A419 Sepsis, unspecified organism: Secondary | ICD-10-CM | POA: Diagnosis not present

## 2015-10-17 DIAGNOSIS — R531 Weakness: Secondary | ICD-10-CM

## 2015-10-17 MED ORDER — LACTULOSE 10 GM/15ML PO SOLN: 10.0000 g | Freq: Every day | ORAL | Status: DC

## 2015-10-17 MED ORDER — PHENYTOIN 50 MG PO CHEW: 100.0000 mg | CHEWABLE_TABLET | Freq: Every day | ORAL | Status: DC

## 2015-10-17 NOTE — Progress Notes (Signed)
Pre visit review using our clinic review tool, if applicable. No additional management support is needed unless otherwise documented below in the visit note. 

## 2015-10-17 NOTE — Progress Notes (Signed)
Subjective:    Patient ID: Carol Page, female    DOB: [birth year], 80 y.o.   MRN: [id number]  HPI  Pt presents to the clinic today to follow up hospital admission and rehab stay and [place].  Admitted 08/13/15 for temperature, tachycardia, elevated white count, elevated BUN/creatinine- concern for urosepsis. Started on IV antibiotics. Chest xray negative for pneumonia. Urine culture- no growth. CT head showed stable small vessel disease. CT abdomen concerning for colitis of transverse colon. Started on Lactulose for large stool burden. Dilantin level was elevated, dose was decreased. HCTZ was stopped and kidney function improved with IV fluids. She was discharged to [facility] on 08/23/15, on Clindamycin for colitis and with a Foley catheter. She was advised to follow up with urology as an outpatient.  She was discharged from [facility] 10/02/15. She is doing home PT and feels like she is getting stronger. She followed up with urology, Dr. Pilar Jarvis 09/28/15. He wanted her off Oxybutin. Her appetite is good. Now walking with walker. Using bedside commode. She has urinary and bowel incontinence at times due to not making it to the toilet on time. She is getting restful sleep at night. She does wake up a few times at night to urinate. She has no concerns at this time.  Review of Systems      Past Medical History  Diagnosis Date  . Stroke ([place])   . Hypertension   . Hyperlipidemia   . Arthritis   . Depression   . Ulcer   . History of blood transfusion   . Urinary incontinence   . Allergy   . Skin cancer of nose   . Cellulitis of left leg   . Pre-diabetes     Current Outpatient Prescriptions  Medication Sig Dispense Refill  . acetaminophen (TYLENOL) 500 MG tablet Take 1,000 mg by mouth every 8 (eight) hours.     Marland Kitchen amitriptyline (ELAVIL) 25 MG tablet Take 1 tablet (25 mg total) by mouth at bedtime. 30 tablet 5  . atorvastatin (LIPITOR) 80 MG tablet TAKE ONE TABLET BY MOUTH ONCE DAILY 30  tablet 5  . CARTIA XT 180 MG 24 hr capsule TAKE ONE CAPSULE BY MOUTH ONCE DAILY 30 capsule 5  . clopidogrel (PLAVIX) 75 MG tablet TAKE ONE TABLET BY MOUTH IN THE EVENING 30 tablet 5  . escitalopram (LEXAPRO) 10 MG tablet TAKE ONE TABLET BY MOUTH ONCE DAILY 30 tablet 3  . gabapentin (NEURONTIN) 100 MG capsule Take 200 mg by mouth 2 (two) times daily. Once at 2pm and then bedtime    . gabapentin (NEURONTIN) 300 MG capsule Take 300 mg by mouth every morning.    . lactulose (CHRONULAC) 10 GM/15ML solution Take 15 mLs (10 g total) by mouth daily. 240 mL 5  . lisinopril (PRINIVIL,ZESTRIL) 10 MG tablet TAKE ONE TABLET BY MOUTH ONCE DAILY 30 tablet 5  . LORazepam (ATIVAN) 1 MG tablet TAKE ONE TABLET BY MOUTH ONCE DAILY AS NEEDED (Patient taking differently: Take 0.5-1 mg by mouth daily as needed. TAKE ONE TABLET BY MOUTH ONCE DAILY AS NEEDED) 30 tablet 0  . traZODone (DESYREL) 50 MG tablet TAKE THREE TABLETS BY MOUTH AT BEDTIME 90 tablet 0  . vitamin B-12 (CYANOCOBALAMIN) 1000 MCG tablet Take 1,000 mcg by mouth daily.    . phenytoin (DILANTIN) 50 MG tablet Chew 2 tablets (100 mg total) by mouth daily. 30 tablet 5   No current facility-administered medications for this visit.    Allergies  Allergen Reactions  .  Codeine Nausea And Vomiting  . Ether Nausea And Vomiting    Family History  Problem Relation Age of Onset  . Heart disease Other     Parent  . Cancer Maternal Uncle     Social History   Social History  . Marital Status: Widowed    Spouse Name: N/A  . Number of Children: 4  . Years of Education: 12   Occupational History  . Retired    Social History Main Topics  . Smoking status: Never Smoker   . Smokeless tobacco: Never Used  . Alcohol Use: No  . Drug Use: No  . Sexual Activity: No   Other Topics Concern  . Not on file   Social History Narrative   Regular exercise-no   Caffeine Use-yes     Constitutional: Pt reports fatigue, especially in the afternoon. Denies  fever, malaise, headache or abrupt weight changes.  HEENT: Denies eye pain, eye redness, ear pain, ringing in the ears, wax buildup, runny nose, nasal congestion, bloody nose, or sore throat. Respiratory: Denies difficulty breathing, shortness of breath, cough or sputum production.   Cardiovascular: Denies chest pain, chest tightness, palpitations or swelling in the hands or feet.  Gastrointestinal: Pt reports loose stool. Denies abdominal pain, bloating, constipation, or blood in the stool.  GU: Pt reports urinary incontinence. Denies urgency, frequency, pain with urination, burning sensation, blood in urine, odor or discharge. Musculoskeletal: Pt reports difficulty with gait. Denies decrease in range of motion, muscle pain or joint pain and swelling.  Skin: Denies redness, rashes, lesions or ulcercations.  Neurological: Pt reports difficulty with balance and coordination. Denies dizziness, difficulty with memory, difficulty with speech.  Psych: Pt reports her mood is better than it has been in a long time.  Denies anxiety, depression, SI/HI.  No other specific complaints in a complete review of systems (except as listed in HPI above).  Objective:   Physical Exam   BP 136/68 mmHg  Pulse 81  Temp(Src) 98.9 F (37.2 C) (Oral)  Wt 221 lb (100.245 kg)  SpO2 96% Wt Readings from Last 3 Encounters:  10/17/15 221 lb (100.245 kg)  08/23/15 242 lb 4.6 oz (109.9 kg)  04/11/15 228 lb (103.42 kg)    General: Appears her stated age, chronically ill appearing, in NAD. Skin: Warm, dry and intact.  Cardiovascular: Normal rate and rhythm. S1,S2 noted.  Murmur noted. No rubs or gallops noted. 1+ non pitting BLE edema.  Pulmonary/Chest: Normal effort and positive vesicular breath sounds. No respiratory distress. No wheezes, rales or ronchi noted.  Abdomen: Soft and nontender. Normal bowel sounds, no bruits noted Musculoskeletal: Walking with a walker (not in a wheelchair today!). Gait slow but  steady. Neurological: Alert and oriented.  Psychiatric: She is engaging and talking more than in previous encounters.  BMET    Component Value Date/Time   NA 138 08/22/2015 1410   K 3.7 08/22/2015 1410   CL 103 08/22/2015 1410   CO2 26 08/22/2015 1410   GLUCOSE 114* 08/22/2015 1410   BUN 6 08/22/2015 1410   CREATININE 0.59 08/22/2015 1410   CALCIUM 7.5* 08/22/2015 1410   GFRNONAA >60 08/22/2015 1410   GFRAA >60 08/22/2015 1410    Lipid Panel     Component Value Date/Time   CHOL 165 10/01/2014 1038   TRIG 123.0 10/01/2014 1038   HDL 49.40 10/01/2014 1038   CHOLHDL 3 10/01/2014 1038   VLDL 24.6 10/01/2014 1038   LDLCALC 91 10/01/2014 1038    CBC  Component Value Date/Time   WBC 9.3 08/22/2015 1410   RBC 2.77* 08/22/2015 1410   HGB 9.3* 08/22/2015 1410   HCT 27.4* 08/22/2015 1410   PLT 235 08/22/2015 1410   MCV 98.9 08/22/2015 1410   MCH 33.6 08/22/2015 1410   MCHC 33.9 08/22/2015 1410   RDW 13.4 08/22/2015 1410   LYMPHSABS 0.9 08/22/2015 1410   MONOABS 1.0 08/22/2015 1410   EOSABS 0.1 08/22/2015 1410   BASOSABS 0.0 08/22/2015 1410    Hgb A1C Lab Results  Component Value Date   HGBA1C 5.5 10/01/2014        Assessment & Plan:   Hospital follow up for colitis, sepsis- rehab stay for generalized weakness:  Hospital notes, labs and imaging reviewed Medications reconciled- Lactulose refilled, continue all other medications Will check A1C per discharge recommendation- no other labs needed Continue to work with home PT Continue home safety devices- walker, bedside commode  RTC in 6 months to follow up chronic conditions

## 2015-10-18 ENCOUNTER — Encounter: Payer: Self-pay | Admitting: Internal Medicine

## 2015-10-18 NOTE — Patient Instructions (Signed)
Fall Prevention in the Home  Falls can cause injuries and can affect people from all age groups. There are many simple things that you can do to make your home safe and to help prevent falls. WHAT CAN I DO ON THE OUTSIDE OF MY HOME?  Regularly repair the edges of walkways and driveways and fix any cracks.  Remove high doorway thresholds.  Trim any shrubbery on the main path into your home.  Use bright outdoor lighting.  Clear walkways of debris and clutter, including tools and rocks.  Regularly check that handrails are securely fastened and in good repair. Both sides of any steps should have handrails.  Install guardrails along the edges of any raised decks or porches.  Have leaves, snow, and ice cleared regularly.  Use sand or salt on walkways during winter months.  In the garage, clean up any spills right away, including grease or oil spills. WHAT CAN I DO IN THE BATHROOM?  Use night lights.  Install grab bars by the toilet and in the tub and shower. Do not use towel bars as grab bars.  Use non-skid mats or decals on the floor of the tub or shower.  If you need to sit down while you are in the shower, use a plastic, non-slip stool..  Keep the floor dry. Immediately clean up any water that spills on the floor.  Remove soap buildup in the tub or shower on a regular basis.  Attach bath mats securely with double-sided non-slip rug tape.  Remove throw rugs and other tripping hazards from the floor. WHAT CAN I DO IN THE BEDROOM?  Use night lights.  Make sure that a bedside light is easy to reach.  Do not use oversized bedding that drapes onto the floor.  Have a firm chair that has side arms to use for getting dressed.  Remove throw rugs and other tripping hazards from the floor. WHAT CAN I DO IN THE KITCHEN?   Clean up any spills right away.  Avoid walking on wet floors.  Place frequently used items in easy-to-reach places.  If you need to reach for something  above you, use a sturdy step stool that has a grab bar.  Keep electrical cables out of the way.  Do not use floor polish or wax that makes floors slippery. If you have to use wax, make sure that it is non-skid floor wax.  Remove throw rugs and other tripping hazards from the floor. WHAT CAN I DO IN THE STAIRWAYS?  Do not leave any items on the stairs.  Make sure that there are handrails on both sides of the stairs. Fix handrails that are broken or loose. Make sure that handrails are as long as the stairways.  Check any carpeting to make sure that it is firmly attached to the stairs. Fix any carpet that is loose or worn.  Avoid having throw rugs at the top or bottom of stairways, or secure the rugs with carpet tape to prevent them from moving.  Make sure that you have a light switch at the top of the stairs and the bottom of the stairs. If you do not have them, have them installed. WHAT ARE SOME OTHER FALL PREVENTION TIPS?  Wear closed-toe shoes that fit well and support your feet. Wear shoes that have rubber soles or low heels.  When you use a stepladder, make sure that it is completely opened and that the sides are firmly locked. Have someone hold the ladder while you   are using it. Do not climb a closed stepladder.  Add color or contrast paint or tape to grab bars and handrails in your home. Place contrasting color strips on the first and last steps.  Use mobility aids as needed, such as canes, walkers, scooters, and crutches.  Turn on lights if it is dark. Replace any light bulbs that burn out.  Set up furniture so that there are clear paths. Keep the furniture in the same spot.  Fix any uneven floor surfaces.  Choose a carpet design that does not hide the edge of steps of a stairway.  Be aware of any and all pets.  Review your medicines with your healthcare provider. Some medicines can cause dizziness or changes in blood pressure, which increase your risk of falling. Talk  with your health care provider about other ways that you can decrease your risk of falls. This may include working with a physical therapist or trainer to improve your strength, balance, and endurance.   This information is not intended to replace advice given to you by your health care provider. Make sure you discuss any questions you have with your health care provider.   Document Released: 06/26/2002 Document Revised: 11/20/2014 Document Reviewed: 08/10/2014 Elsevier Interactive Patient Education 2016 Elsevier Inc.  

## 2015-10-22 DIAGNOSIS — G894 Chronic pain syndrome: Secondary | ICD-10-CM | POA: Diagnosis not present

## 2015-10-22 DIAGNOSIS — G40909 Epilepsy, unspecified, not intractable, without status epilepticus: Secondary | ICD-10-CM | POA: Diagnosis not present

## 2015-10-22 DIAGNOSIS — R7303 Prediabetes: Secondary | ICD-10-CM | POA: Diagnosis not present

## 2015-10-22 DIAGNOSIS — I69311 Memory deficit following cerebral infarction: Secondary | ICD-10-CM | POA: Diagnosis not present

## 2015-10-22 DIAGNOSIS — R1310 Dysphagia, unspecified: Secondary | ICD-10-CM | POA: Diagnosis not present

## 2015-10-22 DIAGNOSIS — I10 Essential (primary) hypertension: Secondary | ICD-10-CM | POA: Diagnosis not present

## 2015-10-24 DIAGNOSIS — I69311 Memory deficit following cerebral infarction: Secondary | ICD-10-CM | POA: Diagnosis not present

## 2015-10-24 DIAGNOSIS — G40909 Epilepsy, unspecified, not intractable, without status epilepticus: Secondary | ICD-10-CM | POA: Diagnosis not present

## 2015-10-24 DIAGNOSIS — G894 Chronic pain syndrome: Secondary | ICD-10-CM | POA: Diagnosis not present

## 2015-10-24 DIAGNOSIS — R7303 Prediabetes: Secondary | ICD-10-CM | POA: Diagnosis not present

## 2015-10-24 DIAGNOSIS — R1310 Dysphagia, unspecified: Secondary | ICD-10-CM | POA: Diagnosis not present

## 2015-10-24 DIAGNOSIS — I10 Essential (primary) hypertension: Secondary | ICD-10-CM | POA: Diagnosis not present

## 2015-10-28 DIAGNOSIS — G894 Chronic pain syndrome: Secondary | ICD-10-CM | POA: Diagnosis not present

## 2015-10-28 DIAGNOSIS — R1310 Dysphagia, unspecified: Secondary | ICD-10-CM | POA: Diagnosis not present

## 2015-10-28 DIAGNOSIS — I10 Essential (primary) hypertension: Secondary | ICD-10-CM | POA: Diagnosis not present

## 2015-10-28 DIAGNOSIS — G40909 Epilepsy, unspecified, not intractable, without status epilepticus: Secondary | ICD-10-CM | POA: Diagnosis not present

## 2015-10-28 DIAGNOSIS — R7303 Prediabetes: Secondary | ICD-10-CM | POA: Diagnosis not present

## 2015-10-28 DIAGNOSIS — I69311 Memory deficit following cerebral infarction: Secondary | ICD-10-CM | POA: Diagnosis not present

## 2015-10-29 ENCOUNTER — Telehealth: Payer: Self-pay | Admitting: Internal Medicine

## 2015-10-29 NOTE — Telephone Encounter (Signed)
LM for pt to sch AWV, mn ° °

## 2015-10-30 DIAGNOSIS — G894 Chronic pain syndrome: Secondary | ICD-10-CM | POA: Diagnosis not present

## 2015-10-30 DIAGNOSIS — G40909 Epilepsy, unspecified, not intractable, without status epilepticus: Secondary | ICD-10-CM | POA: Diagnosis not present

## 2015-10-30 DIAGNOSIS — I69311 Memory deficit following cerebral infarction: Secondary | ICD-10-CM | POA: Diagnosis not present

## 2015-10-30 DIAGNOSIS — I10 Essential (primary) hypertension: Secondary | ICD-10-CM | POA: Diagnosis not present

## 2015-10-30 DIAGNOSIS — R1310 Dysphagia, unspecified: Secondary | ICD-10-CM | POA: Diagnosis not present

## 2015-10-30 DIAGNOSIS — R7303 Prediabetes: Secondary | ICD-10-CM | POA: Diagnosis not present

## 2015-10-31 ENCOUNTER — Other Ambulatory Visit: Payer: Self-pay | Admitting: Internal Medicine

## 2015-10-31 MED ORDER — LACTULOSE 10 GM/15ML PO SOLN: 10.0000 g | Freq: Every day | ORAL | Status: DC

## 2015-10-31 MED ORDER — TRAZODONE HCL 50 MG PO TABS: 150.0000 mg | ORAL_TABLET | Freq: Every day | ORAL | Status: DC

## 2015-10-31 MED ORDER — ESCITALOPRAM OXALATE 10 MG PO TABS: 10.0000 mg | ORAL_TABLET | Freq: Every day | ORAL | Status: DC

## 2015-10-31 MED ORDER — DILTIAZEM HCL ER COATED BEADS 180 MG PO CP24: 180.0000 mg | ORAL_CAPSULE | Freq: Every day | ORAL | Status: DC

## 2015-10-31 MED ORDER — CLOPIDOGREL BISULFATE 75 MG PO TABS: 75.0000 mg | ORAL_TABLET | Freq: Every evening | ORAL | Status: DC

## 2015-10-31 MED ORDER — GABAPENTIN 300 MG PO CAPS: 300.0000 mg | ORAL_CAPSULE | Freq: Every morning | ORAL | Status: DC

## 2015-10-31 MED ORDER — ATORVASTATIN CALCIUM 80 MG PO TABS: 80.0000 mg | ORAL_TABLET | Freq: Every day | ORAL | Status: DC

## 2015-10-31 MED ORDER — AMITRIPTYLINE HCL 25 MG PO TABS: 25.0000 mg | ORAL_TABLET | Freq: Every day | ORAL | Status: DC

## 2015-10-31 MED ORDER — LISINOPRIL 10 MG PO TABS: 10.0000 mg | ORAL_TABLET | Freq: Every day | ORAL | Status: DC

## 2015-10-31 MED ORDER — PHENYTOIN 50 MG PO CHEW: 100.0000 mg | CHEWABLE_TABLET | Freq: Every day | ORAL | Status: DC

## 2015-10-31 MED ORDER — GABAPENTIN 100 MG PO CAPS: 200.0000 mg | ORAL_CAPSULE | Freq: Two times a day (BID) | ORAL | Status: DC

## 2015-11-04 ENCOUNTER — Other Ambulatory Visit: Payer: Self-pay | Admitting: Internal Medicine

## 2016-01-11 ENCOUNTER — Other Ambulatory Visit: Payer: Self-pay | Admitting: Internal Medicine

## 2016-01-29 ENCOUNTER — Other Ambulatory Visit: Payer: Self-pay

## 2016-01-29 MED ORDER — LORAZEPAM 1 MG PO TABS: 0.5000 mg | ORAL_TABLET | Freq: Every day | ORAL | Status: DC | PRN

## 2016-01-29 NOTE — Telephone Encounter (Signed)
Ok to phone in Ativan 

## 2016-01-29 NOTE — Telephone Encounter (Signed)
Last filled 08/23/2015--please advise---per daughter's Debra via [facility]

## 2016-01-29 NOTE — Telephone Encounter (Signed)
Rx called in to pharmacy. 

## 2016-01-30 ENCOUNTER — Telehealth: Payer: Self-pay

## 2016-01-30 NOTE — Telephone Encounter (Signed)
Debra request refill status of lorazepam; spoke with AJ at [facility] and rx ready for pick up. [name] will ck with pharmacy.

## 2016-03-10 ENCOUNTER — Other Ambulatory Visit: Payer: Self-pay | Admitting: Internal Medicine

## 2016-03-18 ENCOUNTER — Other Ambulatory Visit: Payer: Self-pay | Admitting: Internal Medicine

## 2016-03-19 NOTE — Telephone Encounter (Signed)
Last filled 01/29/2016--please advise

## 2016-03-20 NOTE — Telephone Encounter (Signed)
Ok to phone in Ativan 

## 2016-03-20 NOTE — Telephone Encounter (Signed)
Rx called in to pharmacy. 

## 2016-04-03 DIAGNOSIS — N3080 Other cystitis without hematuria: Secondary | ICD-10-CM | POA: Diagnosis not present

## 2016-04-03 DIAGNOSIS — N312 Flaccid neuropathic bladder, not elsewhere classified: Secondary | ICD-10-CM | POA: Diagnosis not present

## 2016-04-08 DIAGNOSIS — H40033 Anatomical narrow angle, bilateral: Secondary | ICD-10-CM | POA: Diagnosis not present

## 2016-04-08 DIAGNOSIS — H2513 Age-related nuclear cataract, bilateral: Secondary | ICD-10-CM | POA: Diagnosis not present

## 2016-04-28 ENCOUNTER — Ambulatory Visit: Payer: Self-pay | Admitting: Internal Medicine

## 2016-05-08 ENCOUNTER — Other Ambulatory Visit: Payer: Self-pay | Admitting: Internal Medicine

## 2016-05-09 NOTE — Telephone Encounter (Signed)
Lorazepam last filled 03/20/2016--please advise

## 2016-05-11 ENCOUNTER — Other Ambulatory Visit: Payer: Self-pay | Admitting: Internal Medicine

## 2016-05-11 NOTE — Telephone Encounter (Signed)
Ok to phone in [place]

## 2016-05-11 NOTE — Telephone Encounter (Signed)
Rx called in to pharmacy. 

## 2016-05-12 ENCOUNTER — Encounter: Payer: Self-pay | Admitting: Internal Medicine

## 2016-05-12 ENCOUNTER — Ambulatory Visit (INDEPENDENT_AMBULATORY_CARE_PROVIDER_SITE_OTHER): Payer: Medicare Other | Admitting: Internal Medicine

## 2016-05-12 VITALS — BP 138/64 | HR 77 | Temp 98.8°F | Ht 63.0 in | Wt 237.0 lb

## 2016-05-12 DIAGNOSIS — G8929 Other chronic pain: Secondary | ICD-10-CM

## 2016-05-12 DIAGNOSIS — Z23 Encounter for immunization: Secondary | ICD-10-CM

## 2016-05-12 DIAGNOSIS — E7800 Pure hypercholesterolemia, unspecified: Secondary | ICD-10-CM

## 2016-05-12 DIAGNOSIS — I10 Essential (primary) hypertension: Secondary | ICD-10-CM

## 2016-05-12 DIAGNOSIS — N393 Stress incontinence (female) (male): Secondary | ICD-10-CM

## 2016-05-12 DIAGNOSIS — F419 Anxiety disorder, unspecified: Secondary | ICD-10-CM

## 2016-05-12 DIAGNOSIS — R7303 Prediabetes: Secondary | ICD-10-CM

## 2016-05-12 DIAGNOSIS — F329 Major depressive disorder, single episode, unspecified: Secondary | ICD-10-CM

## 2016-05-12 DIAGNOSIS — Z0000 Encounter for general adult medical examination without abnormal findings: Secondary | ICD-10-CM

## 2016-05-12 DIAGNOSIS — E559 Vitamin D deficiency, unspecified: Secondary | ICD-10-CM | POA: Diagnosis not present

## 2016-05-12 DIAGNOSIS — F418 Other specified anxiety disorders: Secondary | ICD-10-CM

## 2016-05-12 DIAGNOSIS — F32A Depression, unspecified: Secondary | ICD-10-CM

## 2016-05-12 DIAGNOSIS — G40909 Epilepsy, unspecified, not intractable, without status epilepticus: Secondary | ICD-10-CM

## 2016-05-12 DIAGNOSIS — M79605 Pain in left leg: Secondary | ICD-10-CM

## 2016-05-12 DIAGNOSIS — Z8673 Personal history of transient ischemic attack (TIA), and cerebral infarction without residual deficits: Secondary | ICD-10-CM

## 2016-05-12 LAB — CBC
HEMATOCRIT: 37.9 % (ref 36.0–46.0)
Hemoglobin: 13.1 g/dL (ref 12.0–15.0)
MCHC: 34.4 g/dL (ref 30.0–36.0)
MCV: 95.9 fl (ref 78.0–100.0)
Platelets: 203 10*3/uL (ref 150.0–400.0)
RBC: 3.95 Mil/uL (ref 3.87–5.11)
RDW: 13.1 % (ref 11.5–15.5)
WBC: 9.4 10*3/uL (ref 4.0–10.5)

## 2016-05-12 LAB — COMPREHENSIVE METABOLIC PANEL
ALT: 33 U/L (ref 0–35)
AST: 20 U/L (ref 0–37)
Albumin: 4.5 g/dL (ref 3.5–5.2)
Alkaline Phosphatase: 67 U/L (ref 39–117)
BUN: 26 mg/dL — AB (ref 6–23)
CHLORIDE: 103 meq/L (ref 96–112)
CO2: 26 meq/L (ref 19–32)
Calcium: 9.6 mg/dL (ref 8.4–10.5)
Creatinine, Ser: 1.07 mg/dL (ref 0.40–1.20)
GFR: 52.62 mL/min — AB (ref >60.00)
GLUCOSE: 85 mg/dL (ref 70–99)
POTASSIUM: 4.6 meq/L (ref 3.5–5.1)
SODIUM: 138 meq/L (ref 135–145)
Total Bilirubin: 0.3 mg/dL (ref 0.2–1.2)
Total Protein: 7.5 g/dL (ref 6.0–8.3)

## 2016-05-12 LAB — LIPID PANEL
Cholesterol: 173 mg/dL (ref 0–200)
HDL: 53.5 mg/dL (ref >39.00)
LDL CALC: 80 mg/dL (ref 0–99)
NONHDL: 119.3
Total CHOL/HDL Ratio: 3
Triglycerides: 195 mg/dL — ABNORMAL HIGH (ref 0.0–149.0)
VLDL: 39 mg/dL (ref 0.0–40.0)

## 2016-05-12 LAB — VITAMIN D 25 HYDROXY (VIT D DEFICIENCY, FRACTURES): VITD: 39.65 ng/mL (ref 30.00–100.00)

## 2016-05-12 LAB — HEMOGLOBIN A1C: HEMOGLOBIN A1C: 5.6 % (ref 4.6–6.5)

## 2016-05-12 MED ORDER — LACTULOSE 10 GM/15ML PO SOLN: 10.0000 g | Freq: Every day | ORAL | 5 refills | Status: DC

## 2016-05-12 MED ORDER — ATORVASTATIN CALCIUM 80 MG PO TABS: 80.0000 mg | ORAL_TABLET | Freq: Every day | ORAL | 5 refills | Status: DC

## 2016-05-12 MED ORDER — CLOPIDOGREL BISULFATE 75 MG PO TABS: 75.0000 mg | ORAL_TABLET | Freq: Every evening | ORAL | 5 refills | Status: DC

## 2016-05-12 MED ORDER — PHENYTOIN 50 MG PO CHEW: 100.0000 mg | CHEWABLE_TABLET | Freq: Every day | ORAL | 5 refills | Status: DC

## 2016-05-12 MED ORDER — LISINOPRIL 10 MG PO TABS: 10.0000 mg | ORAL_TABLET | Freq: Every day | ORAL | 5 refills | Status: DC

## 2016-05-12 MED ORDER — AMITRIPTYLINE HCL 25 MG PO TABS: 25.0000 mg | ORAL_TABLET | Freq: Every day | ORAL | 5 refills | Status: DC

## 2016-05-12 MED ORDER — HYDROCHLOROTHIAZIDE 12.5 MG PO CAPS: 12.5000 mg | ORAL_CAPSULE | Freq: Every day | ORAL | 3 refills | Status: DC

## 2016-05-12 MED ORDER — DILTIAZEM HCL ER COATED BEADS 180 MG PO CP24: 180.0000 mg | ORAL_CAPSULE | Freq: Every day | ORAL | 5 refills | Status: DC

## 2016-05-12 MED ORDER — TRAZODONE HCL 50 MG PO TABS: 150.0000 mg | ORAL_TABLET | Freq: Every day | ORAL | 5 refills | Status: DC

## 2016-05-12 MED ORDER — ESCITALOPRAM OXALATE 10 MG PO TABS: 10.0000 mg | ORAL_TABLET | Freq: Every day | ORAL | 5 refills | Status: DC

## 2016-05-12 MED ORDER — GABAPENTIN 300 MG PO CAPS: 300.0000 mg | ORAL_CAPSULE | Freq: Every morning | ORAL | 5 refills | Status: DC

## 2016-05-12 MED ORDER — GABAPENTIN 100 MG PO CAPS: 200.0000 mg | ORAL_CAPSULE | Freq: Two times a day (BID) | ORAL | 5 refills | Status: DC

## 2016-05-12 NOTE — Progress Notes (Signed)
HPI:  Pt presents to the clinic today for her Medicare Wellness Exam. She is also here for follow up of chronic medical conditions.  Anxiety and Depression: Triggered by her poor health and not being able to care for herself. Stable on her Lexapro and Ativan. She only takes 1 Ativan per day. She also takes Trazadone at night to help her sleep.   HTN: BP well controlled on Lisinopril, HCTZ and Cardizem. Her BP today is 138/64. She denies chest pain, chest tightness or shortness of breath. ECG from 07/2015 reviewed.  HLD: She denies myalgias on Lipitor. Her last LDL was 91. 05/12/16  Prediabetes: Her last A1C was 5.5%. She denies blurred vision, polyuria or numbness or tingling in the hands or feet. 05/12/16  Seizures: No seizures on Dilantin. 05/12/16  Obesity: She is up 9 lbs since her last visit. BMI of 41.98.  Stroke: On Lipitor and Plavix. LDL controlled. No angina. BP controlled with Lisinopril, HCTZ and Cardizem. 05/12/16  Urinary Incontinence: Improved with but still wears an incontinence brief every day. She is not longer taking Ditropan because it caused confusion.  Left leg pain: Chronic but stable on Gabapentin and Amitriptyline. She denies any recent falls.  Past Medical History:  Diagnosis Date  . Allergy   . Arthritis   . Cellulitis of left leg   . Depression   . History of blood transfusion   . Hyperlipidemia   . Hypertension   . Pre-diabetes   . Skin cancer of nose   . Stroke ([place])   . Ulcer ([place])   . Urinary incontinence     Current Outpatient Prescriptions  Medication Sig Dispense Refill  . acetaminophen (TYLENOL) 500 MG tablet Take 1,000 mg by mouth every 8 (eight) hours.     Marland Kitchen amitriptyline (ELAVIL) 25 MG tablet Take 1 tablet (25 mg total) by mouth at bedtime. 30 tablet 5  . atorvastatin (LIPITOR) 80 MG tablet Take 1 tablet (80 mg total) by mouth daily. 30 tablet 5  . clopidogrel (PLAVIX) 75 MG tablet Take 1 tablet (75 mg total) by mouth every evening. 30  tablet 5  . diltiazem (CARTIA XT) 180 MG 24 hr capsule Take 1 capsule (180 mg total) by mouth daily. 30 capsule 5  . escitalopram (LEXAPRO) 10 MG tablet Take 1 tablet (10 mg total) by mouth daily. 30 tablet 5  . gabapentin (NEURONTIN) 100 MG capsule Take 2 capsules (200 mg total) by mouth 2 (two) times daily. Once at 2pm and then bedtime 120 capsule 5  . gabapentin (NEURONTIN) 300 MG capsule Take 1 capsule (300 mg total) by mouth every morning. 30 capsule 5  . hydrochlorothiazide (MICROZIDE) 12.5 MG capsule TAKE ONE CAPSULE BY MOUTH ONCE DAILY 30 capsule 1  . lactulose (CHRONULAC) 10 GM/15ML solution Take 15 mLs (10 g total) by mouth daily. 240 mL 5  . lisinopril (PRINIVIL,ZESTRIL) 10 MG tablet Take 1 tablet (10 mg total) by mouth daily. 30 tablet 5  . LORazepam (ATIVAN) 1 MG tablet TAKE ONE-HALF TO ONE TABLET BY MOUTH ONCE DAILY AS NEEDED 30 tablet 0  . phenytoin (DILANTIN) 50 MG tablet Chew 2 tablets (100 mg total) by mouth daily. 60 tablet 5  . traZODone (DESYREL) 50 MG tablet TAKE THREE TABLETS BY MOUTH AT BEDTIME 90 tablet 2  . vitamin B-12 (CYANOCOBALAMIN) 1000 MCG tablet Take 1,000 mcg by mouth daily.     No current facility-administered medications for this visit.     Allergies  Allergen Reactions  .  Codeine Nausea And Vomiting  . Ether Nausea And Vomiting    Family History  Problem Relation Age of Onset  . Heart disease Other     Parent  . Cancer Maternal Uncle     Social History   Social History  . Marital status: Widowed    Spouse name: N/A  . Number of children: 4  . Years of education: 12   Occupational History  . Retired    Social History Main Topics  . Smoking status: Never Smoker  . Smokeless tobacco: Never Used  . Alcohol use No  . Drug use: No  . Sexual activity: No   Other Topics Concern  . Not on file   Social History Narrative   Regular exercise-no   Caffeine Use-yes    Hospitiliaztions: None  Health Maintenance:    Flu:  03/2015  Tetanus: unsure of her last one,  Pneumovax: unsure  Prevnar: 03/2014  Zostavax: not sure that she had chicken pox  Mammogram: < 5 years ago, but no longer wants to screen   Bone Density: > 2 years ago, but no longer wants to screen  Colonoscopy: never, she does not want one  Eye Doctor: > 2 years ago  Dental Exam: never, has dentures   I have personally reviewed and have noted:  1. The patient's medical and social history 2. Their use of alcohol, tobacco or illicit drugs 3. Their current medications and supplements 4. The patient's functional ability including ADL's, fall risks, home  safety risks and hearing or visual impairment. 5. Diet and physical activities 6. Evidence for depression or mood disorder  Providers:   PCP: Webb Silversmith, NP-C  Urologist:  Dr. Pilar Jarvis   Subjective:   Review of Systems:   Constitutional: Denies fever, malaise, headache, fatigue or abrupt weight changes.  Respiratory: Denies difficulty breathing, shortness of breath, cough or sputum production.   Cardiovascular: Denies chest pain, chest tightness, palpitations or swelling in the hands or feet.  Gastrointestinal: Denies abdominal pain, bloating, constipation, diarrhea or blood in the stool.  GU: Pt reports incontinence. Denies urgency, frequency, pain with urination, burning sensation, blood in urine, odor or discharge. Musculoskeletal: Pt reports left lower leg pain and difficulty with gait. Denies decrease in range of motion, muscle pain or joint pain and swelling.  Skin: Denies redness, rashes, lesions or ulcercations.  Neurological: Pt reports problems with balance and coordination. Denies dizziness, difficulty with memory, difficulty with speech. Psych: Pt reports anxiety and depression. Denies SI/HI.   No other specific complaints in a complete review of systems (except as listed in HPI above).  Objective:  PE:   BP 138/64   Pulse 77   Temp 98.8 F (37.1 C) (Oral)   Ht 5\' 3"   (1.6 m)   Wt 237 lb (107.5 kg)   SpO2 97%   BMI 41.98 kg/m  Wt Readings from Last 3 Encounters:  05/12/16 237 lb (107.5 kg)  10/17/15 221 lb (100.2 kg)  08/23/15 242 lb 4.6 oz (109.9 kg)    General: Appears her stated age, chronically ill appearing in NAD. HEENT: Head: normal shape and size; Eyes: sclera white, no icterus, conjunctiva pink, PERRLA and EOMs intact; Neck:  Neck supple, trachea midline. No masses, lumps or thyromegaly present.  Cardiovascular: Normal rate and rhythm. S1,S2 noted.  No murmur, rubs or gallops noted. No JVD. Trace BLE edema. No carotid bruits noted. Pulmonary/Chest: Normal effort and positive vesicular breath sounds. No respiratory distress. No wheezes, rales or ronchi noted.  Abdomen: Soft and nontender.  Musculoskeletal: Using walker for assistance with gait. Neurological: Alert and oriented.  Psychiatric: Mood and affect flat.    BMET    Component Value Date/Time   NA 138 05/12/2016 1430   K 4.6 05/12/2016 1430   CL 103 05/12/2016 1430   CO2 26 05/12/2016 1430   GLUCOSE 85 05/12/2016 1430   BUN 26 (H) 05/12/2016 1430   CREATININE 1.07 05/12/2016 1430   CALCIUM 9.6 05/12/2016 1430   GFRNONAA >60 08/22/2015 1410   GFRAA >60 08/22/2015 1410    Lipid Panel     Component Value Date/Time   CHOL 173 05/12/2016 1430   TRIG 195.0 (H) 05/12/2016 1430   HDL 53.50 05/12/2016 1430   CHOLHDL 3 05/12/2016 1430   VLDL 39.0 05/12/2016 1430   LDLCALC 80 05/12/2016 1430    CBC    Component Value Date/Time   WBC 9.4 05/12/2016 1430   RBC 3.95 05/12/2016 1430   HGB 13.1 05/12/2016 1430   HCT 37.9 05/12/2016 1430   PLT 203.0 05/12/2016 1430   MCV 95.9 05/12/2016 1430   MCH 33.6 08/22/2015 1410   MCHC 34.4 05/12/2016 1430   RDW 13.1 05/12/2016 1430   LYMPHSABS 0.9 08/22/2015 1410   MONOABS 1.0 08/22/2015 1410   EOSABS 0.1 08/22/2015 1410   BASOSABS 0.0 08/22/2015 1410    Hgb A1C Lab Results  Component Value Date   HGBA1C 5.6 05/12/2016         Assessment and Plan:   Medicare Annual Wellness Visit:  Diet: She is being provided with a low fat diet with lots of fruits and veggies Physical activity: Sedentary Depression/mood screen: Positive, but chronic, on medication Hearing: Intact to whispered voice Visual acuity: Grossly normal, does not perform annual eye exam  ADLs: Needs assist Fall risk: High fall risk duet to impaired mobility and left leg pain Home safety: Good Cognitive evaluation: Intact to orientation, naming, recall and repetition EOL planning: Discussed advance directives, she does not have one. She would not like intubation or feeding tube. DNR form filled out today. Copy in chart.  Preventative Medicine:  Flu and Tdap given today (she was advised that Medicare will not pay for Tdap, daughter understands and agrees for it to be given). She is sure she had the pneumovax. Prevnar UTD. They will call insurance about Zostovax. She declines pap smear, mammogram, bone density and colon screening. Encouraged her to consume a balanced diet and walk for exercise. Advised her to see an eye doctor and dentist annually. Will check CBC, CMET, Lipid, A1C and Vit D today.   Next appointment: 1 year, Medicare Wellness/Follow up  Webb Silversmith, NP

## 2016-05-13 ENCOUNTER — Encounter: Payer: Self-pay | Admitting: Internal Medicine

## 2016-05-13 NOTE — Assessment & Plan Note (Signed)
None on Dilantin Will monitor All medications refilled today

## 2016-05-13 NOTE — Assessment & Plan Note (Signed)
Ongoing but failed Ditropan She is not interested in other medication at this time Continue wearing incontinence pads

## 2016-05-13 NOTE — Assessment & Plan Note (Signed)
Stable on Amitriptyline and Gabapentin Continue to use walker for assistance All medications refilled today

## 2016-05-13 NOTE — Assessment & Plan Note (Signed)
No residual effect Lipid panel today Continue Cardizem, Lipitor, Lisinopril, HCTZ and Plavix All medications refilled today

## 2016-05-13 NOTE — Patient Instructions (Signed)

## 2016-05-13 NOTE — Assessment & Plan Note (Signed)
Chronic but controlled on Lexapro, Ativan and Trazadone Will monitor All medications refilled today

## 2016-05-13 NOTE — Assessment & Plan Note (Signed)
A1C today. 

## 2016-05-13 NOTE — Assessment & Plan Note (Signed)
Encouraged her to work on diet and exercise 

## 2016-05-13 NOTE — Assessment & Plan Note (Signed)
Lipid panel and CMET today Encouraged her to consume a low fat diet Continue Lipitor Medications refilled today

## 2016-05-13 NOTE — Assessment & Plan Note (Signed)
Controlled on Lisinopril, HCTZ and Cardizem All medications refilled today

## 2016-05-14 ENCOUNTER — Encounter: Payer: Self-pay | Admitting: *Deleted

## 2016-05-19 NOTE — Addendum Note (Signed)
Addended by: Lurlean Nanny on: 05/19/2016 04:26 PM   Modules accepted: Orders

## 2016-07-06 ENCOUNTER — Other Ambulatory Visit: Payer: Self-pay | Admitting: Internal Medicine

## 2016-07-06 NOTE — Telephone Encounter (Signed)
Last filled 05/11/2016--please advise

## 2016-07-07 ENCOUNTER — Other Ambulatory Visit: Payer: Self-pay | Admitting: Internal Medicine

## 2016-07-07 NOTE — Telephone Encounter (Signed)
Rx called in to pharmacy. 

## 2016-07-07 NOTE — Telephone Encounter (Signed)
Ok to phone in [place]

## 2016-09-09 ENCOUNTER — Other Ambulatory Visit: Payer: Self-pay | Admitting: Internal Medicine

## 2016-09-09 NOTE — Telephone Encounter (Signed)
Last filled 07/07/16--please advise

## 2016-09-10 NOTE — Telephone Encounter (Signed)
Ok to phone in [place]  *please change class to "phone in"

## 2016-09-10 NOTE — Telephone Encounter (Signed)
Rx called in to pharmacy. 

## 2016-09-22 ENCOUNTER — Other Ambulatory Visit: Payer: Self-pay | Admitting: Internal Medicine

## 2016-09-22 NOTE — Telephone Encounter (Signed)
Spoke to [place] at the pharmacy. She said there was no record of the lorazepam called in on 09-10-16. I was placed on hold again. I hung up and called it in on the vm

## 2016-11-08 ENCOUNTER — Other Ambulatory Visit: Payer: Self-pay | Admitting: Internal Medicine

## 2016-11-09 NOTE — Telephone Encounter (Signed)
Last filled 09/22/2016--please advise

## 2016-11-09 NOTE — Telephone Encounter (Signed)
Ok to phone in [place]ien 

## 2016-11-09 NOTE — Telephone Encounter (Signed)
Rx called in to pharmacy. 

## 2016-11-09 NOTE — Telephone Encounter (Signed)
I meant Ativan, disregard previous message

## 2016-12-09 ENCOUNTER — Other Ambulatory Visit: Payer: Self-pay | Admitting: Internal Medicine

## 2017-01-07 ENCOUNTER — Other Ambulatory Visit: Payer: Self-pay | Admitting: Internal Medicine

## 2017-01-08 MED ORDER — TRAZODONE HCL 50 MG PO TABS: 150.0000 mg | ORAL_TABLET | Freq: Every day | ORAL | 0 refills | Status: DC

## 2017-01-08 MED ORDER — LORAZEPAM 1 MG PO TABS: 1.0000 mg | ORAL_TABLET | Freq: Every day | ORAL | 0 refills | Status: DC | PRN

## 2017-01-08 NOTE — Telephone Encounter (Signed)
Ativan last filled 11/09/2016... Please advise

## 2017-01-08 NOTE — Telephone Encounter (Signed)
Ok to phone in [place]

## 2017-01-11 NOTE — Telephone Encounter (Signed)
Rx called in to pharmacy. 

## 2017-03-09 ENCOUNTER — Other Ambulatory Visit: Payer: Self-pay | Admitting: Internal Medicine

## 2017-03-11 IMAGING — CT CT HEAD W/O CM
1 series · 15 of 30 positions shown, 19 images · non-contrast
Comparison: Head CT 05/04/2015 and 04/08/2014.

CLINICAL DATA: Found down and unresponsive. Bilateral leg weakness
with sluggish pupil response.

EXAM:
CT HEAD WITHOUT CONTRAST
TECHNIQUE: Contiguous axial images were obtained from the base of the skull
through the vertex without intravenous contrast.

[Series 2: head 5.0 h30s · axial · 0.43mm/px · z∈[-102,+38]mm · 15 of 32 slices shown, 19 images]
[im 2/32  brain]
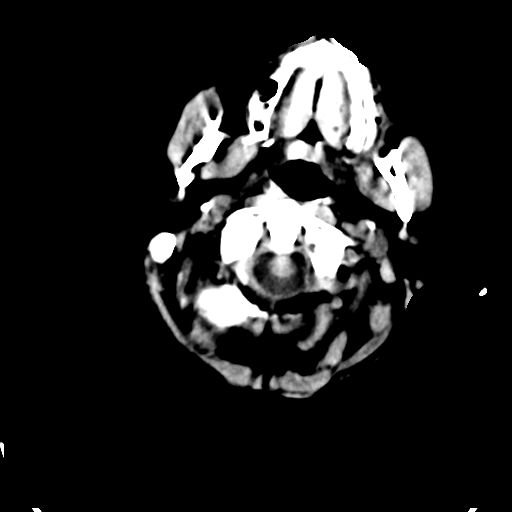
[im 2/32  bone]
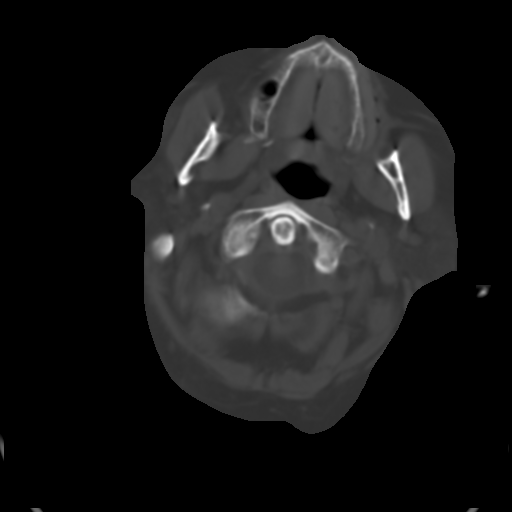
[im 4/32  brain]
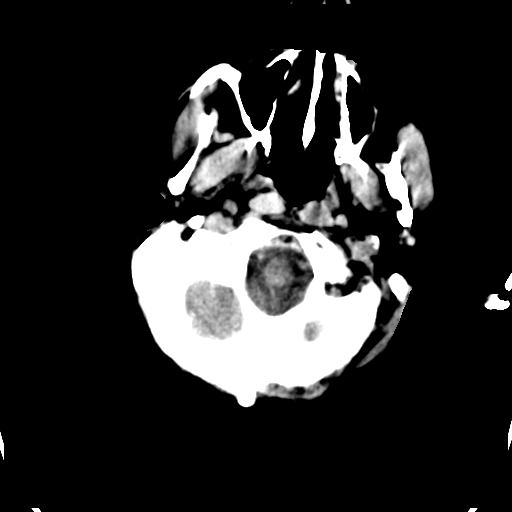
[im 6/32  brain]
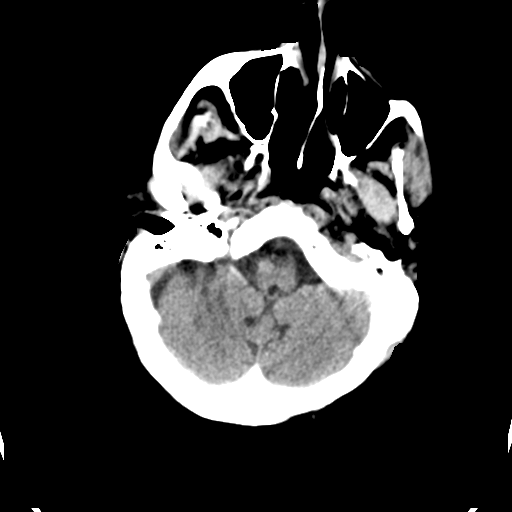
[im 8/32  brain]
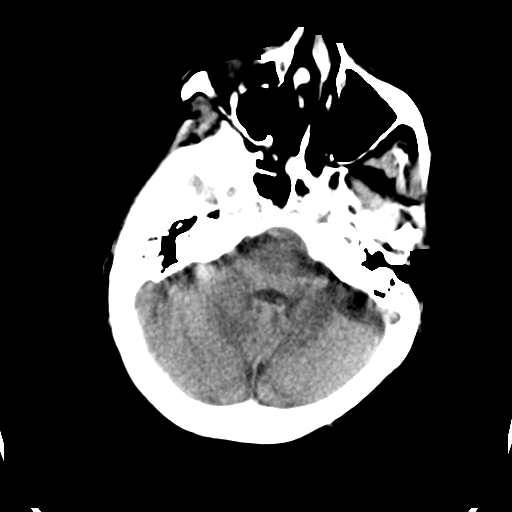
[im 10/32  brain]
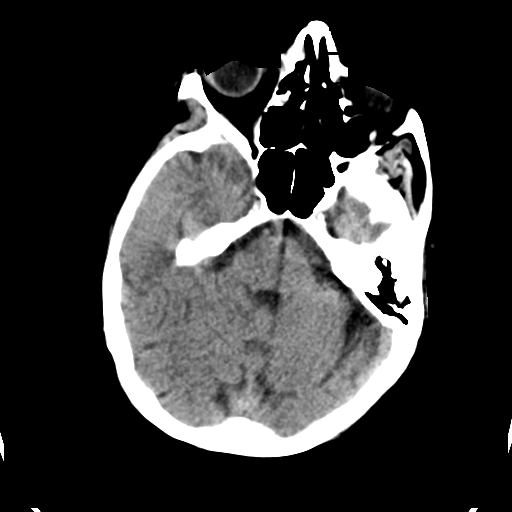
[im 10/32  bone]
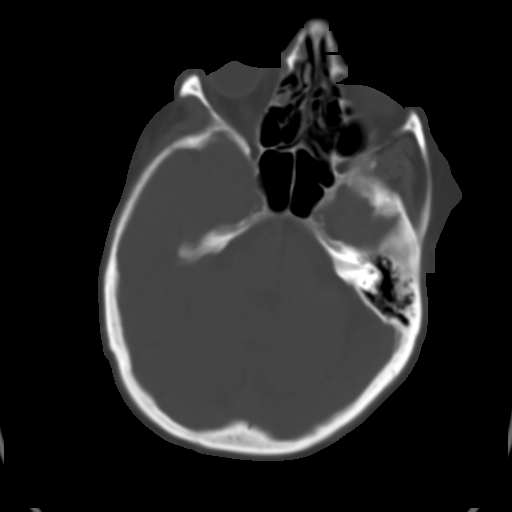
[im 12/32  brain]
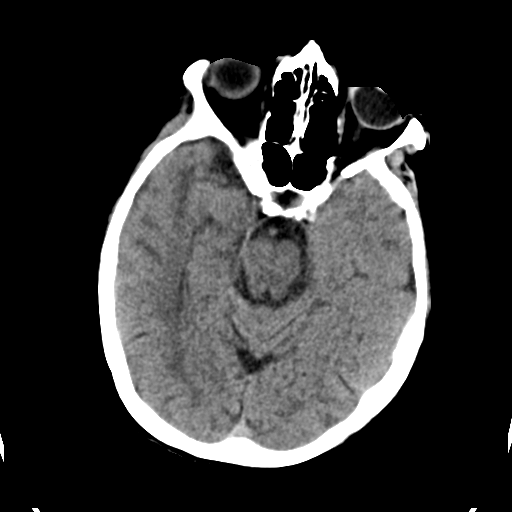
[im 14/32  brain]
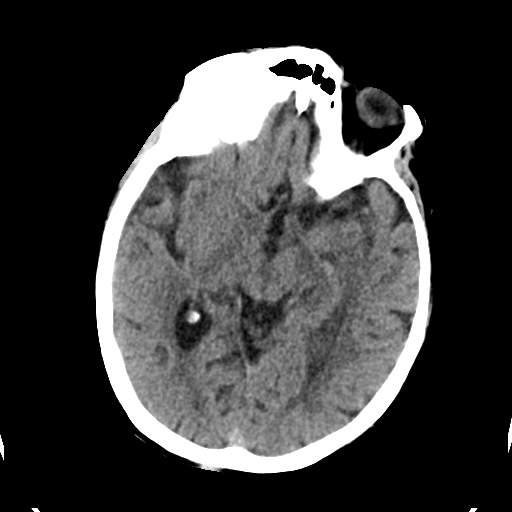
[im 17/32  brain]
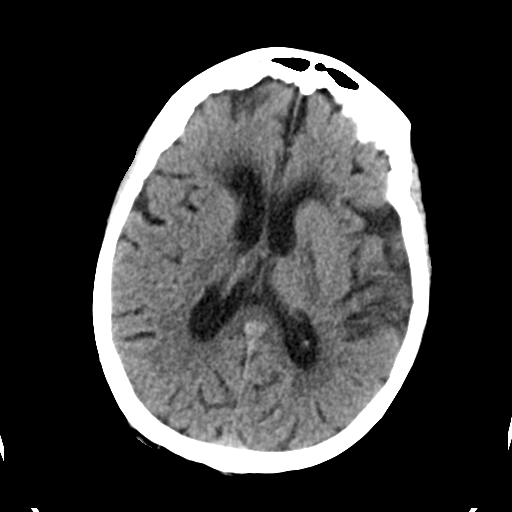
[im 18/32  brain]
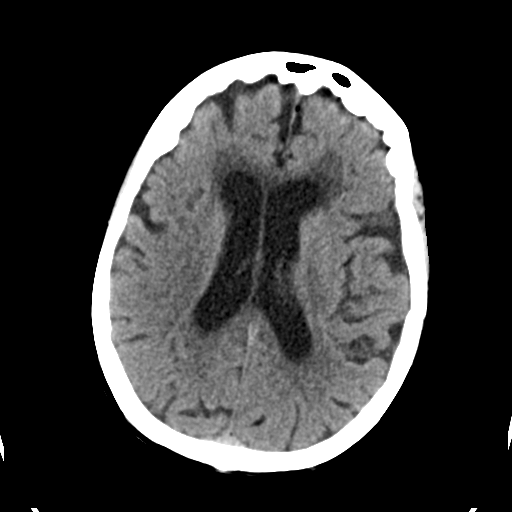
[im 18/32  bone]
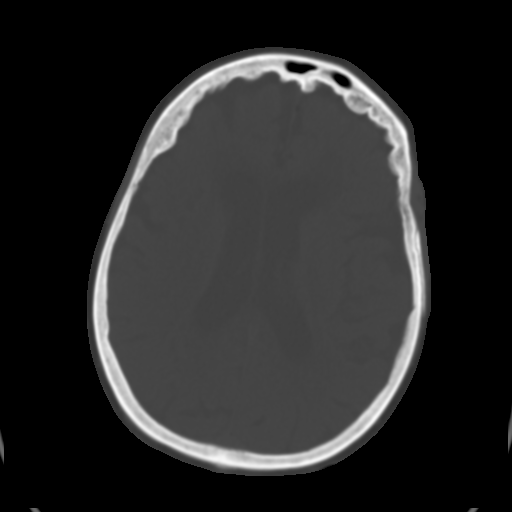
[im 20/32  brain]
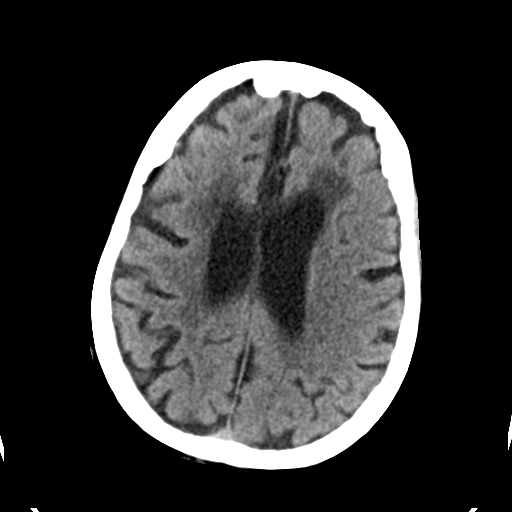
[im 22/32  brain]
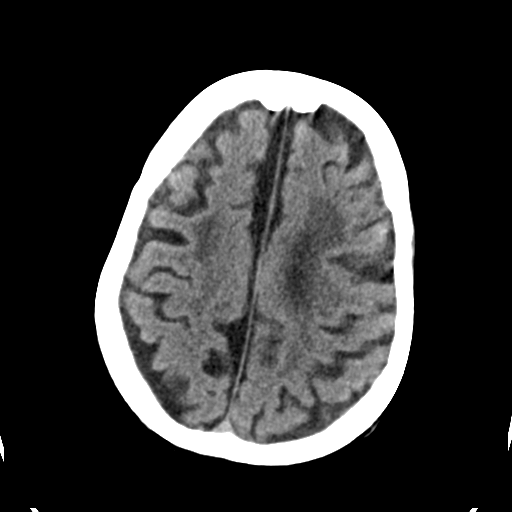
[im 24/32  brain]
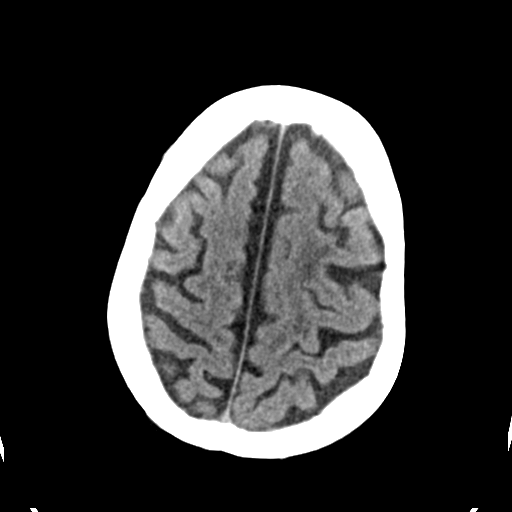
[im 26/32  brain]
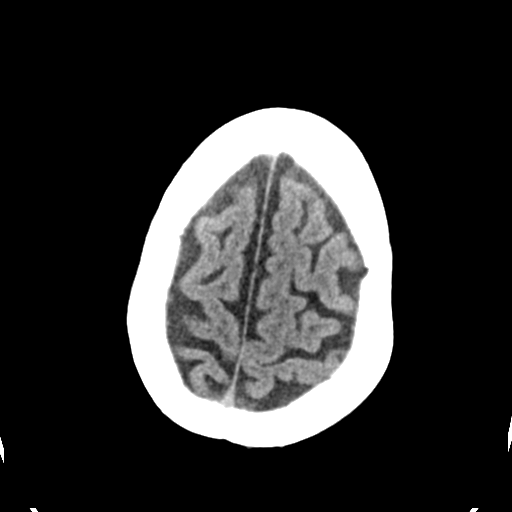
[im 26/32  bone]
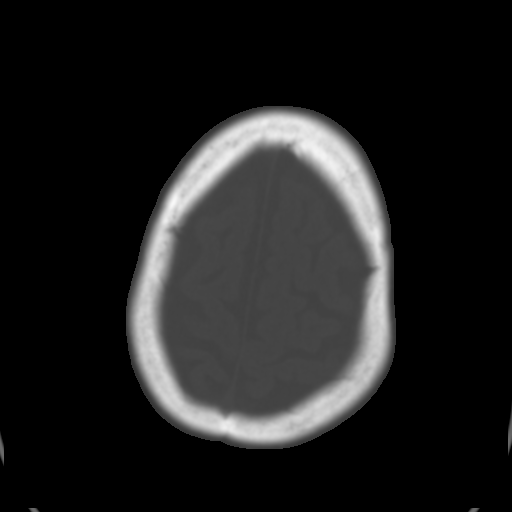
[im 28/32  brain]
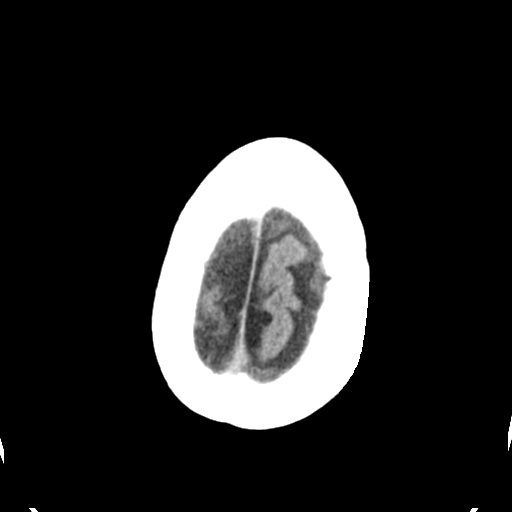
[im 30/32  brain]
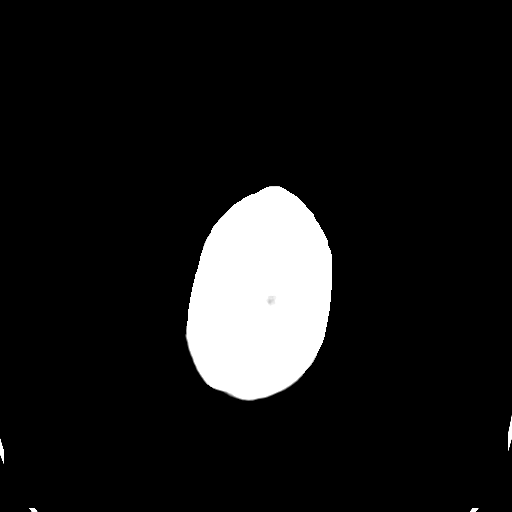

[15 of 30 positions shown; findings below may reference images not displayed]

FINDINGS: There is no evidence of acute intracranial hemorrhage, mass lesion,
brain edema or extra-axial fluid collection. The ventricles and
subarachnoid spaces are prominent but stable. There is no CT
evidence of acute cortical infarction. There are stable chronic
small vessel ischemic changes in the periventricular white matter
and basal ganglia bilaterally.

There is a small mucous retention cyst inferiorly in the right
maxillary sinus. There is chronic partial left mastoid air cell
opacification. The visualized paranasal sinuses, mastoid air cells
and middle ears are otherwise clear. The calvarium is intact.
IMPRESSION: Stable head CT demonstrating atrophy and chronic small vessel
ischemic changes. No generalized cerebral edema or acute focal
abnormality identified.

## 2017-03-12 ENCOUNTER — Other Ambulatory Visit: Payer: Self-pay | Admitting: Internal Medicine

## 2017-03-12 NOTE — Telephone Encounter (Signed)
Last filled 05/12/16 with 5 refills... Please advise if okay to refill

## 2017-03-24 ENCOUNTER — Other Ambulatory Visit: Payer: Self-pay

## 2017-03-24 MED ORDER — LORAZEPAM 1 MG PO TABS
1.0000 mg | ORAL_TABLET | Freq: Every day | ORAL | 0 refills | Status: DC | PRN
Start: 2017-03-24 — End: 2017-05-13

## 2017-03-24 NOTE — Telephone Encounter (Signed)
Ok to phone in [place]

## 2017-03-24 NOTE — Telephone Encounter (Signed)
Last filled 01/08/17... Please advise

## 2017-03-25 NOTE — Telephone Encounter (Signed)
Rx called in to pharmacy. 

## 2017-04-13 ENCOUNTER — Other Ambulatory Visit: Payer: Self-pay | Admitting: Internal Medicine

## 2017-05-13 ENCOUNTER — Other Ambulatory Visit: Payer: Self-pay | Admitting: Internal Medicine

## 2017-05-13 NOTE — Telephone Encounter (Signed)
Last refill lorazepam 03/24/17, trazodone 01/08/17 #270 Last OV 05/12/16. CPE scheduled on 05/25/17  Ok to refill?

## 2017-05-14 NOTE — Telephone Encounter (Signed)
Ok to phone in [place]. Rx for Trazadone sent electronically.

## 2017-05-14 NOTE — Telephone Encounter (Signed)
Rx called in to pharmacy. 

## 2017-05-25 ENCOUNTER — Ambulatory Visit (INDEPENDENT_AMBULATORY_CARE_PROVIDER_SITE_OTHER): Payer: Medicare Other | Admitting: Internal Medicine

## 2017-05-25 ENCOUNTER — Encounter: Payer: Self-pay | Admitting: Internal Medicine

## 2017-05-25 VITALS — BP 140/72 | HR 91 | Temp 98.0°F | Ht 63.0 in | Wt 256.0 lb

## 2017-05-25 DIAGNOSIS — R0989 Other specified symptoms and signs involving the circulatory and respiratory systems: Secondary | ICD-10-CM

## 2017-05-25 DIAGNOSIS — E7800 Pure hypercholesterolemia, unspecified: Secondary | ICD-10-CM | POA: Diagnosis not present

## 2017-05-25 DIAGNOSIS — N393 Stress incontinence (female) (male): Secondary | ICD-10-CM

## 2017-05-25 DIAGNOSIS — F419 Anxiety disorder, unspecified: Secondary | ICD-10-CM | POA: Diagnosis not present

## 2017-05-25 DIAGNOSIS — G40909 Epilepsy, unspecified, not intractable, without status epilepticus: Secondary | ICD-10-CM

## 2017-05-25 DIAGNOSIS — R7303 Prediabetes: Secondary | ICD-10-CM

## 2017-05-25 DIAGNOSIS — E559 Vitamin D deficiency, unspecified: Secondary | ICD-10-CM

## 2017-05-25 DIAGNOSIS — G8929 Other chronic pain: Secondary | ICD-10-CM

## 2017-05-25 DIAGNOSIS — Z23 Encounter for immunization: Secondary | ICD-10-CM

## 2017-05-25 DIAGNOSIS — F329 Major depressive disorder, single episode, unspecified: Secondary | ICD-10-CM

## 2017-05-25 DIAGNOSIS — Z8673 Personal history of transient ischemic attack (TIA), and cerebral infarction without residual deficits: Secondary | ICD-10-CM | POA: Diagnosis not present

## 2017-05-25 DIAGNOSIS — M79605 Pain in left leg: Secondary | ICD-10-CM | POA: Diagnosis not present

## 2017-05-25 DIAGNOSIS — I10 Essential (primary) hypertension: Secondary | ICD-10-CM | POA: Diagnosis not present

## 2017-05-25 DIAGNOSIS — F32A Depression, unspecified: Secondary | ICD-10-CM

## 2017-05-25 DIAGNOSIS — Z0000 Encounter for general adult medical examination without abnormal findings: Secondary | ICD-10-CM | POA: Diagnosis not present

## 2017-05-25 LAB — CBC
HCT: 40.5 % (ref 36.0–46.0)
Hemoglobin: 13.4 g/dL (ref 12.0–15.0)
MCHC: 33 g/dL (ref 30.0–36.0)
MCV: 101.2 fl — ABNORMAL HIGH (ref 78.0–100.0)
PLATELETS: 230 10*3/uL (ref 150.0–400.0)
RBC: 4 Mil/uL (ref 3.87–5.11)
RDW: 13 % (ref 11.5–15.5)
WBC: 10.3 10*3/uL (ref 4.0–10.5)

## 2017-05-25 LAB — LIPID PANEL
CHOL/HDL RATIO: 3
Cholesterol: 148 mg/dL (ref 0–200)
HDL: 56 mg/dL (ref >39.00)
LDL CALC: 69 mg/dL (ref 0–99)
NONHDL: 91.74
TRIGLYCERIDES: 112 mg/dL (ref 0.0–149.0)
VLDL: 22.4 mg/dL (ref 0.0–40.0)

## 2017-05-25 LAB — COMPREHENSIVE METABOLIC PANEL
ALT: 23 U/L (ref 0–35)
AST: 17 U/L (ref 0–37)
Albumin: 4.3 g/dL (ref 3.5–5.2)
Alkaline Phosphatase: 73 U/L (ref 39–117)
BILIRUBIN TOTAL: 0.4 mg/dL (ref 0.2–1.2)
BUN: 26 mg/dL — AB (ref 6–23)
CALCIUM: 9.5 mg/dL (ref 8.4–10.5)
CHLORIDE: 103 meq/L (ref 96–112)
CO2: 21 meq/L (ref 19–32)
CREATININE: 1.16 mg/dL (ref 0.40–1.20)
GFR: 47.81 mL/min — ABNORMAL LOW (ref >60.00)
Glucose, Bld: 126 mg/dL — ABNORMAL HIGH (ref 70–99)
Potassium: 4.6 mEq/L (ref 3.5–5.1)
SODIUM: 138 meq/L (ref 135–145)
Total Protein: 7.5 g/dL (ref 6.0–8.3)

## 2017-05-25 LAB — VITAMIN D 25 HYDROXY (VIT D DEFICIENCY, FRACTURES): VITD: 25.64 ng/mL — AB (ref 30.00–100.00)

## 2017-05-25 NOTE — Progress Notes (Signed)
HPI:  Pt presents to the clinic today for her Medicare Wellness Exam. She is also due to follow up chronic conditions.   Anxiety and Depression: Persistent anxiety, secondary to health related issues. She is taking the Lexapro and Ativan as prescribed. She uses Trazadone at night for sleep.  HTN: Her BP today is 140/72. She is taking Diltiazem, Lisinopril and HCTZ. ECG from 07/2015 reviewed.  HLD: Her last LDL was 80,triglycerides 195, 07/2015. She denies myalgias on Lipitor. She rarely eats fried foods.  Prediabetes: Her last A1C was 5.6, 04/2016. She does have some urinary frequency but attributes this to OAB. She is not taking any oral hypoglycemic medications at this time.  Seizures: She denies any recent seizures on Dilantin.  History of Stroke: She is taking Atorvastatin and Plavix as prescribed. She does have some residual weakness and needs help with ADL's. Here care is provided by her daughter.  OAB: This is her biggest complaint. She wears incontinence pads daily. She was on anticholinergics, but reports they made her more confused.  Chronic Left Leg Pain: Stable on Gabapentin and Amitriptyline. She has had 2 falls in the last month without injury.  Past Medical History:  Diagnosis Date  . Allergy   . Arthritis   . Cellulitis of left leg   . Depression   . History of blood transfusion   . Hyperlipidemia   . Hypertension   . Pre-diabetes   . Skin cancer of nose   . Stroke ([place])   . Ulcer   . Urinary incontinence     Current Outpatient Medications  Medication Sig Dispense Refill  . acetaminophen (TYLENOL) 500 MG tablet Take 1,000 mg by mouth every 8 (eight) hours.     Marland Kitchen amitriptyline (ELAVIL) 25 MG tablet TAKE ONE TABLET BY MOUTH AT BEDTIME 90 tablet 1  . atorvastatin (LIPITOR) 80 MG tablet TAKE 1 TABLET BY MOUTH ONCE DAILY 90 tablet 0  . CARTIA XT 180 MG 24 hr capsule TAKE ONE CAPSULE BY MOUTH ONCE DAILY 90 capsule 1  . clopidogrel (PLAVIX) 75 MG tablet TAKE ONE  TABLET BY MOUTH IN THE EVENING 90 tablet 1  . escitalopram (LEXAPRO) 10 MG tablet TAKE 1 TABLET BY MOUTH ONCE DAILY 90 tablet 0  . gabapentin (NEURONTIN) 100 MG capsule TAKE TWO CAPSULES BY MOUTH TWICE DAILY AT 2:00 PM AND BEDTIME 360 capsule 1  . gabapentin (NEURONTIN) 300 MG capsule TAKE 1 CAPSULE BY MOUTH IN THE MORNING 90 capsule 0  . hydrochlorothiazide (MICROZIDE) 12.5 MG capsule Take 1 capsule (12.5 mg total) by mouth daily. 30 capsule 3  . hydrochlorothiazide (MICROZIDE) 12.5 MG capsule TAKE ONE CAPSULE BY MOUTH ONCE DAILY 90 capsule 1  . lactulose (CHRONULAC) 10 GM/15ML solution TAKE 15 ML BY MOUTH ONCE DAILY 240 mL 5  . lisinopril (PRINIVIL,ZESTRIL) 10 MG tablet TAKE ONE TABLET BY MOUTH ONCE DAILY 90 tablet 1  . LORazepam (ATIVAN) 1 MG tablet TAKE 1 TABLET BY MOUTH ONCE DAILY AS NEEDED 30 tablet 0  . phenytoin (DILANTIN) 50 MG tablet TAKE TWO TABLETS BY MOUTH ONCE DAILY 180 tablet 1  . traZODone (DESYREL) 50 MG tablet TAKE 3 TABLETS BY MOUTH AT BEDTIME 270 tablet 0  . vitamin B-12 (CYANOCOBALAMIN) 1000 MCG tablet Take 1,000 mcg by mouth daily.     No current facility-administered medications for this visit.     Allergies  Allergen Reactions  . Codeine Nausea And Vomiting  . Ether Nausea And Vomiting    Family History  Problem  Relation Age of Onset  . Heart disease Other        Parent  . Cancer Maternal Uncle     Social History   Socioeconomic History  . Marital status: Widowed    Spouse name: Not on file  . Number of children: 4  . Years of education: 9  . Highest education level: Not on file  Social Needs  . Financial resource strain: Not on file  . Food insecurity - worry: Not on file  . Food insecurity - inability: Not on file  . Transportation needs - medical: Not on file  . Transportation needs - non-medical: Not on file  Occupational History  . Occupation: Retired  Tobacco Use  . Smoking status: Never Smoker  . Smokeless tobacco: Never Used  Substance  and Sexual Activity  . Alcohol use: No  . Drug use: No  . Sexual activity: No  Other Topics Concern  . Not on file  Social History Narrative   Regular exercise-no   Caffeine Use-yes    Hospitiliaztions: None  Health Maintenance:    Flu: 04/2016  Tetanus: 04/2016  Pneumovax: never   Prevnar: 03/2014  Zostavax: never  Mammogram: no longer screening  Pap Smear: no longer screening  Bone Density: no longer screening  Colon Screening: no longer screening  Eye Doctor: annually  Dental Exam: as needed   Providers:   PCP: Webb Silversmith, NP-C  Urologist: Dr. Pilar Jarvis    I have personally reviewed and have noted:  1. The patient's medical and social history 2. Their use of alcohol, tobacco or illicit drugs 3. Their current medications and supplements 4. The patient's functional ability including ADL's, fall risks, home safety risks and hearing or visual impairment. 5. Diet and physical activities 6. Evidence for depression or mood disorder  Subjective:   Review of Systems:   Constitutional: Pt reports fatigue and weight gain. Denies fever, malaise, fatigue.  HEENT: Denies eye pain, eye redness, ear pain, ringing in the ears, wax buildup, runny nose, nasal congestion, bloody nose, or sore throat. Respiratory: Denies difficulty breathing, shortness of breath, cough or sputum production.   Cardiovascular: Pt reports swelling in her legs. Denies chest pain, chest tightness, palpitations or swelling in the hands.  Gastrointestinal: Denies abdominal pain, bloating, constipation, diarrhea or blood in the stool.  GU: Pt reports urinary frequency and incontinence. Denies urgency, pain with urination, burning sensation, blood in urine, odor or discharge. Musculoskeletal: Pt reports chronic left leg pain. Denies decrease in range of motion, muscle pain or joint pain and swelling.  Skin: Denies redness, rashes, lesions or ulcercations.  Neurological: Denies dizziness, difficulty with  memory, difficulty with speech or problems with balance and coordination.  Psych: Pt has a history of anxiety and depression. Denies SI/HI.  No other specific complaints in a complete review of systems (except as listed in HPI above).  Objective:  PE:   BP 140/72   Pulse 91   Temp 98 F (36.7 C) (Oral)   Ht 5\' 3"  (1.6 m)   Wt 256 lb (116.1 kg)   SpO2 97%   BMI 45.35 kg/m   Wt Readings from Last 3 Encounters:  05/12/16 237 lb (107.5 kg)  10/17/15 221 lb (100.2 kg)  08/23/15 242 lb 4.6 oz (109.9 kg)    General: Appears her stated age, obese, chronically ill appearing, in NAD. Skin: Warm, dry and intact.  HEENT: Head: normal shape and size; Eyes: sclera white, no icterus, conjunctiva pink, PERRLA and EOMs intact;  Ears: Tm's gray and intact, normal light reflex; Throat/Mouth: Teeth present, mucosa pink and moist, no exudate, lesions or ulcerations noted.  Neck: Neck supple, trachea midline. No masses, lumps present.  Cardiovascular: Normal rate and rhythm. S1,S2 noted.  No murmur, rubs or gallops noted. Trace BLE edema. Bilateral carotid bruits noted. Pulmonary/Chest: Normal effort and positive vesicular breath sounds. No respiratory distress. No wheezes, rales or ronchi noted.  Abdomen: Soft and nontender. Normal bowel sounds. No distention or masses noted. Liver, spleen and kidneys non palpable. Musculoskeletal: Strength 5/5 BUE, 4/5 BLE. Gait slow and steady with use of walker. Neurological: Alert and oriented. Psychiatric: Mood and affect flat. Behavior is normal. Judgment and thought content normal.    BMET    Component Value Date/Time   NA 138 05/12/2016 1430   K 4.6 05/12/2016 1430   CL 103 05/12/2016 1430   CO2 26 05/12/2016 1430   GLUCOSE 85 05/12/2016 1430   BUN 26 (H) 05/12/2016 1430   CREATININE 1.07 05/12/2016 1430   CALCIUM 9.6 05/12/2016 1430   GFRNONAA >60 08/22/2015 1410   GFRAA >60 08/22/2015 1410    Lipid Panel     Component Value Date/Time   CHOL  173 05/12/2016 1430   TRIG 195.0 (H) 05/12/2016 1430   HDL 53.50 05/12/2016 1430   CHOLHDL 3 05/12/2016 1430   VLDL 39.0 05/12/2016 1430   LDLCALC 80 05/12/2016 1430    CBC    Component Value Date/Time   WBC 9.4 05/12/2016 1430   RBC 3.95 05/12/2016 1430   HGB 13.1 05/12/2016 1430   HCT 37.9 05/12/2016 1430   PLT 203.0 05/12/2016 1430   MCV 95.9 05/12/2016 1430   MCH 33.6 08/22/2015 1410   MCHC 34.4 05/12/2016 1430   RDW 13.1 05/12/2016 1430   LYMPHSABS 0.9 08/22/2015 1410   MONOABS 1.0 08/22/2015 1410   EOSABS 0.1 08/22/2015 1410   BASOSABS 0.0 08/22/2015 1410    Hgb A1C Lab Results  Component Value Date   HGBA1C 5.6 05/12/2016      Assessment and Plan:   Medicare Annual Wellness Visit:  Diet: She does eat meat. She consumes fruits and veggies. She does eat a little fried food. She drinks some water, ginger ale Physical activity: Sedentary Depression/mood screen: Positive, chronic on meds. Hearing: Intact to whispered voice Visual acuity: Grossly normal ADLs: Needs some help with bathing, dressing Fall risk: High Home safety: Good Cognitive evaluation: Intact to orientation, naming, recall and repetition EOL planning: No adv directives, DNR/ I agree  Preventative Medicine: Flu and prevnar today. Pneumovax and tetanus UTD. She declines shingles vaccines. She does not want pap smear, mammogram, colon screening or bone density. Encouraged her to consume a balanced diet and exercise regimen. Advised her to see an eye doctor and dentist annually. Will check CBC, CMET, Lipid and Vit D today.  Bilateral Carotid Bruits:  Bilateral carotid ultrasound ordered  Next appointment: 1 year, Medicare Wellness Exam    Webb Silversmith, NP

## 2017-05-25 NOTE — Patient Instructions (Signed)
Health Maintenance for Postmenopausal Women Menopause is a normal process in which your reproductive ability comes to an end. This process happens gradually over a span of months to years, usually between the ages of 22 and 9. Menopause is complete when you have missed 12 consecutive menstrual periods. It is important to talk with your health care provider about some of the most common conditions that affect postmenopausal women, such as heart disease, cancer, and bone loss (osteoporosis). Adopting a healthy lifestyle and getting preventive care can help to promote your health and wellness. Those actions can also lower your chances of developing some of these common conditions. What should I know about menopause? During menopause, you may experience a number of symptoms, such as:  Moderate-to-severe hot flashes.  Night sweats.  Decrease in sex drive.  Mood swings.  Headaches.  Tiredness.  Irritability.  Memory problems.  Insomnia.  Choosing to treat or not to treat menopausal changes is an individual decision that you make with your health care provider. What should I know about hormone replacement therapy and supplements? Hormone therapy products are effective for treating symptoms that are associated with menopause, such as hot flashes and night sweats. Hormone replacement carries certain risks, especially as you become older. If you are thinking about using estrogen or estrogen with progestin treatments, discuss the benefits and risks with your health care provider. What should I know about heart disease and stroke? Heart disease, heart attack, and stroke become more likely as you age. This may be due, in part, to the hormonal changes that your body experiences during menopause. These can affect how your body processes dietary fats, triglycerides, and cholesterol. Heart attack and stroke are both medical emergencies. There are many things that you can do to help prevent heart disease  and stroke:  Have your blood pressure checked at least every 1-2 years. High blood pressure causes heart disease and increases the risk of stroke.  If you are 53-22 years old, ask your health care provider if you should take aspirin to prevent a heart attack or a stroke.  Do not use any tobacco products, including cigarettes, chewing tobacco, or electronic cigarettes. If you need help quitting, ask your health care provider.  It is important to eat a healthy diet and maintain a healthy weight. ? Be sure to include plenty of vegetables, fruits, low-fat dairy products, and lean protein. ? Avoid eating foods that are high in solid fats, added sugars, or salt (sodium).  Get regular exercise. This is one of the most important things that you can do for your health. ? Try to exercise for at least 150 minutes each week. The type of exercise that you do should increase your heart rate and make you sweat. This is known as moderate-intensity exercise. ? Try to do strengthening exercises at least twice each week. Do these in addition to the moderate-intensity exercise.  Know your numbers.Ask your health care provider to check your cholesterol and your blood glucose. Continue to have your blood tested as directed by your health care provider.  What should I know about cancer screening? There are several types of cancer. Take the following steps to reduce your risk and to catch any cancer development as early as possible. Breast Cancer  Practice breast self-awareness. ? This means understanding how your breasts normally appear and feel. ? It also means doing regular breast self-exams. Let your health care provider know about any changes, no matter how small.  If you are 40  or older, have a clinician do a breast exam (clinical breast exam or CBE) every year. Depending on your age, family history, and medical history, it may be recommended that you also have a yearly breast X-ray (mammogram).  If you  have a family history of breast cancer, talk with your health care provider about genetic screening.  If you are at high risk for breast cancer, talk with your health care provider about having an MRI and a mammogram every year.  Breast cancer (BRCA) gene test is recommended for women who have family members with BRCA-related cancers. Results of the assessment will determine the need for genetic counseling and BRCA1 and for BRCA2 testing. BRCA-related cancers include these types: ? Breast. This occurs in males or females. ? Ovarian. ? Tubal. This may also be called fallopian tube cancer. ? Cancer of the abdominal or pelvic lining (peritoneal cancer). ? Prostate. ? Pancreatic.  Cervical, Uterine, and Ovarian Cancer Your health care provider may recommend that you be screened regularly for cancer of the pelvic organs. These include your ovaries, uterus, and vagina. This screening involves a pelvic exam, which includes checking for microscopic changes to the surface of your cervix (Pap test).  For women ages 21-65, health care providers may recommend a pelvic exam and a Pap test every three years. For women ages 79-65, they may recommend the Pap test and pelvic exam, combined with testing for human papilloma virus (HPV), every five years. Some types of HPV increase your risk of cervical cancer. Testing for HPV may also be done on women of any age who have unclear Pap test results.  Other health care providers may not recommend any screening for nonpregnant women who are considered low risk for pelvic cancer and have no symptoms. Ask your health care provider if a screening pelvic exam is right for you.  If you have had past treatment for cervical cancer or a condition that could lead to cancer, you need Pap tests and screening for cancer for at least 20 years after your treatment. If Pap tests have been discontinued for you, your risk factors (such as having a new sexual partner) need to be  reassessed to determine if you should start having screenings again. Some women have medical problems that increase the chance of getting cervical cancer. In these cases, your health care provider may recommend that you have screening and Pap tests more often.  If you have a family history of uterine cancer or ovarian cancer, talk with your health care provider about genetic screening.  If you have vaginal bleeding after reaching menopause, tell your health care provider.  There are currently no reliable tests available to screen for ovarian cancer.  Lung Cancer Lung cancer screening is recommended for adults 69-62 years old who are at high risk for lung cancer because of a history of smoking. A yearly low-dose CT scan of the lungs is recommended if you:  Currently smoke.  Have a history of at least 30 pack-years of smoking and you currently smoke or have quit within the past 15 years. A pack-year is smoking an average of one pack of cigarettes per day for one year.  Yearly screening should:  Continue until it has been 15 years since you quit.  Stop if you develop a health problem that would prevent you from having lung cancer treatment.  Colorectal Cancer  This type of cancer can be detected and can often be prevented.  Routine colorectal cancer screening usually begins at  age 42 and continues through age 45.  If you have risk factors for colon cancer, your health care provider may recommend that you be screened at an earlier age.  If you have a family history of colorectal cancer, talk with your health care provider about genetic screening.  Your health care provider may also recommend using home test kits to check for hidden blood in your stool.  A small camera at the end of a tube can be used to examine your colon directly (sigmoidoscopy or colonoscopy). This is done to check for the earliest forms of colorectal cancer.  Direct examination of the colon should be repeated every  5-10 years until age 71. However, if early forms of precancerous polyps or small growths are found or if you have a family history or genetic risk for colorectal cancer, you may need to be screened more often.  Skin Cancer  Check your skin from head to toe regularly.  Monitor any moles. Be sure to tell your health care provider: ? About any new moles or changes in moles, especially if there is a change in a mole's shape or color. ? If you have a mole that is larger than the size of a pencil eraser.  If any of your family members has a history of skin cancer, especially at a young age, talk with your health care provider about genetic screening.  Always use sunscreen. Apply sunscreen liberally and repeatedly throughout the day.  Whenever you are outside, protect yourself by wearing long sleeves, pants, a wide-brimmed hat, and sunglasses.  What should I know about osteoporosis? Osteoporosis is a condition in which bone destruction happens more quickly than new bone creation. After menopause, you may be at an increased risk for osteoporosis. To help prevent osteoporosis or the bone fractures that can happen because of osteoporosis, the following is recommended:  If you are 46-71 years old, get at least 1,000 mg of calcium and at least 600 mg of vitamin D per day.  If you are older than age 55 but younger than age 65, get at least 1,200 mg of calcium and at least 600 mg of vitamin D per day.  If you are older than age 54, get at least 1,200 mg of calcium and at least 800 mg of vitamin D per day.  Smoking and excessive alcohol intake increase the risk of osteoporosis. Eat foods that are rich in calcium and vitamin D, and do weight-bearing exercises several times each week as directed by your health care provider. What should I know about how menopause affects my mental health? Depression may occur at any age, but it is more common as you become older. Common symptoms of depression  include:  Low or sad mood.  Changes in sleep patterns.  Changes in appetite or eating patterns.  Feeling an overall lack of motivation or enjoyment of activities that you previously enjoyed.  Frequent crying spells.  Talk with your health care provider if you think that you are experiencing depression. What should I know about immunizations? It is important that you get and maintain your immunizations. These include:  Tetanus, diphtheria, and pertussis (Tdap) booster vaccine.  Influenza every year before the flu season begins.  Pneumonia vaccine.  Shingles vaccine.  Your health care provider may also recommend other immunizations. This information is not intended to replace advice given to you by your health care provider. Make sure you discuss any questions you have with your health care provider. Document Released: 08/28/2005  Document Revised: 01/24/2016 Document Reviewed: 04/09/2015 Elsevier Interactive Patient Education  2018 [employer]  

## 2017-05-26 ENCOUNTER — Encounter: Payer: Self-pay | Admitting: Internal Medicine

## 2017-05-31 NOTE — Assessment & Plan Note (Signed)
Reasonable control on Diltiazem, Lisinopril and HCTZ CBC and CMET today Will monitor

## 2017-05-31 NOTE — Assessment & Plan Note (Signed)
Will check A1C at next visit Encouraged her to consume a low carb diet and exercise for weight loss

## 2017-05-31 NOTE — Assessment & Plan Note (Signed)
AMS with anticholinergics Encouraged her to continue to wear incontinence pads She will continue to follow with urology

## 2017-05-31 NOTE — Assessment & Plan Note (Signed)
Chronic but stable on Lexapro and Ativan Support offered today Will monitor

## 2017-05-31 NOTE — Assessment & Plan Note (Signed)
None on Dilantin CMET today Will monitor

## 2017-05-31 NOTE — Assessment & Plan Note (Signed)
Continue Atorvastatin and Plavix. Needs some assist with ADL's

## 2017-05-31 NOTE — Assessment & Plan Note (Signed)
CMET and lipid profile today Encouraged her to consume a low fat diet Continue Atorvastatin for now 

## 2017-05-31 NOTE — Assessment & Plan Note (Signed)
Encouraged her to work on diet and exercise 

## 2017-05-31 NOTE — Assessment & Plan Note (Signed)
Continue Amitriptyline and Gabapentin Will monitor

## 2017-06-01 ENCOUNTER — Ambulatory Visit (HOSPITAL_COMMUNITY)
Admission: RE | Admit: 2017-06-01 | Discharge: 2017-06-01 | Disposition: A | Discharge status: Home / Self Care | Payer: Medicare Other | Source: Ambulatory Visit | Attending: Vascular Surgery | Admitting: Vascular Surgery

## 2017-06-01 DIAGNOSIS — R0989 Other specified symptoms and signs involving the circulatory and respiratory systems: Secondary | ICD-10-CM | POA: Diagnosis not present

## 2017-06-01 DIAGNOSIS — I6523 Occlusion and stenosis of bilateral carotid arteries: Secondary | ICD-10-CM | POA: Diagnosis not present

## 2017-06-01 LAB — VAS US CAROTID
LCCADDIAS: -26 cm/s
LEFT ECA DIAS: -20 cm/s
LICADDIAS: -25 cm/s
LICAPDIAS: -28 cm/s
Left CCA dist sys: -103 cm/s
Left CCA prox dias: 27 cm/s
Left CCA prox sys: 127 cm/s
Left ICA dist sys: -90 cm/s
Left ICA prox sys: -84 cm/s
RCCADSYS: -105 cm/s
RCCAPDIAS: 31 cm/s
RIGHT CCA MID DIAS: 19 cm/s
RIGHT ECA DIAS: -17 cm/s
Right CCA prox sys: 148 cm/s

## 2017-06-03 ENCOUNTER — Other Ambulatory Visit: Payer: Self-pay | Admitting: Internal Medicine

## 2017-06-24 ENCOUNTER — Other Ambulatory Visit: Payer: Self-pay | Admitting: Internal Medicine

## 2017-06-24 NOTE — Telephone Encounter (Signed)
Last filled 12/09/16 90 days w/ 1 refill... Please advise

## 2017-07-30 ENCOUNTER — Other Ambulatory Visit: Payer: Self-pay | Admitting: Internal Medicine

## 2017-07-30 NOTE — Telephone Encounter (Signed)
Ativan last filled 05/14/17... Please advise

## 2017-08-18 ENCOUNTER — Other Ambulatory Visit: Payer: Self-pay | Admitting: Internal Medicine

## 2017-09-02 ENCOUNTER — Other Ambulatory Visit: Payer: Self-pay | Admitting: Internal Medicine

## 2017-09-02 MED ORDER — NYSTATIN 100000 UNIT/GM EX POWD: Freq: Four times a day (QID) | CUTANEOUS | 2 refills | Status: DC

## 2017-09-05 ENCOUNTER — Other Ambulatory Visit: Payer: Self-pay | Admitting: Internal Medicine

## 2017-09-06 NOTE — Telephone Encounter (Signed)
Last filled 07/31/17... Please advise 

## 2017-10-07 ENCOUNTER — Other Ambulatory Visit: Payer: Self-pay

## 2017-10-07 MED ORDER — GABAPENTIN 100 MG PO CAPS: ORAL_CAPSULE | ORAL | 2 refills | Status: DC

## 2017-10-07 MED ORDER — GABAPENTIN 300 MG PO CAPS: ORAL_CAPSULE | ORAL | 2 refills | Status: DC

## 2017-10-29 ENCOUNTER — Other Ambulatory Visit: Payer: Self-pay

## 2017-10-29 MED ORDER — LACTULOSE 10 GM/15ML PO SOLN
10.0000 g | Freq: Every day | ORAL | 5 refills | Status: DC
Start: 2017-10-29 — End: 2018-04-20

## 2017-11-18 ENCOUNTER — Other Ambulatory Visit: Payer: Self-pay | Admitting: Internal Medicine

## 2017-11-18 NOTE — Telephone Encounter (Signed)
Baity pt, Last filled 09/07/17 #30... Please advise

## 2017-12-07 ENCOUNTER — Other Ambulatory Visit: Payer: Self-pay | Admitting: Internal Medicine

## 2018-01-04 ENCOUNTER — Other Ambulatory Visit: Payer: Self-pay | Admitting: Internal Medicine

## 2018-01-05 NOTE — Telephone Encounter (Signed)
Last filled 12/18 #180 with 1 refill... AWV was 05/2017... Please advise

## 2018-01-25 ENCOUNTER — Other Ambulatory Visit: Payer: Self-pay | Admitting: Internal Medicine

## 2018-01-25 NOTE — Telephone Encounter (Signed)
Name of Medication: Lorazepam 1mg  Name of Pharmacy: [facility] or Written Date and Quantity: 11/18/17 #30  Last Office Visit and Type: 05/2017 AWV Next Office Visit and Type: none Last Controlled Substance Agreement Date: none Last UDS: none

## 2018-03-17 ENCOUNTER — Other Ambulatory Visit: Payer: Self-pay | Admitting: Internal Medicine

## 2018-03-18 NOTE — Telephone Encounter (Signed)
Lorazepam last filled 01/25/2018, last OV AWV 05/2017... Please advise

## 2018-03-24 ENCOUNTER — Other Ambulatory Visit: Payer: Self-pay

## 2018-03-24 ENCOUNTER — Other Ambulatory Visit: Payer: Self-pay | Admitting: Internal Medicine

## 2018-03-24 MED ORDER — TRAZODONE HCL 50 MG PO TABS: 150.0000 mg | ORAL_TABLET | Freq: Every day | ORAL | 0 refills | Status: DC

## 2018-04-05 ENCOUNTER — Telehealth: Payer: Self-pay | Admitting: Internal Medicine

## 2018-04-05 MED ORDER — AMITRIPTYLINE HCL 25 MG PO TABS: 25.0000 mg | ORAL_TABLET | Freq: Every day | ORAL | 0 refills | Status: DC

## 2018-04-05 MED ORDER — LISINOPRIL 10 MG PO TABS: 10.0000 mg | ORAL_TABLET | Freq: Every day | ORAL | 0 refills | Status: DC

## 2018-04-05 MED ORDER — DILTIAZEM HCL ER COATED BEADS 180 MG PO CP24: 180.0000 mg | ORAL_CAPSULE | Freq: Every day | ORAL | 0 refills | Status: DC

## 2018-04-05 NOTE — Telephone Encounter (Signed)
This has already been done.

## 2018-04-05 NOTE — Telephone Encounter (Signed)
Copied from [place] [phone]. Topic: Quick Communication - See Telephone Encounter >> Apr 05, 2018  2:27 PM Vernona Rieger wrote: CRM for notification. See Telephone encounter for: 04/05/18.  [facility] called and said they never received the scripts for amitriptyline (ELAVIL) 25 MG tablet, CARTIA XT 180 MG 24 hr capsule & lisinopril (PRINIVIL,ZESTRIL) 10 MG tablet that was confirmed by them on 8/30. Can those be re-sent?  [facility] on [address]

## 2018-04-05 NOTE — Addendum Note (Signed)
Addended by: Lurlean Nanny on: 04/05/2018 03:59 PM   Modules accepted: Orders

## 2018-04-18 ENCOUNTER — Telehealth: Payer: Self-pay

## 2018-04-18 NOTE — Telephone Encounter (Signed)
Rollene Fare,  this is about my mother, Carol Page, 05-Aug-2035  Mom has been having issues with hemorrhoids and now constipation. She had hemorrhoids that were bleeding on about the 25th of this month. We got her medicated wipes and ointment and the bleeding has stopped. The problem is she has not passed any stool since the 26th. I am giving her the Lactulose& activia yogurt daily. Yesterday and today I have given her fleets enemas in hopes of helping to stimulate her, but I am not sure how much she retained because she doesn't try to hold the fluid. When I went to give her the enema a few minutes ago, I am not sure if she had a huge hemorrhoid or her bowel was sticking out of her rectum(it was the size of a tangerine). I pushed it back into her rectum.  She does not want me disempacting her, but with what I saw, I am kind of nervous doing anything else.    Is there anyway you can see her tomorrow morning Or should I take her somewhere else to be seen?. Thanks [name]   I will forward mychart msg from daughter [name] as she messaged via her page and not the pt's

## 2018-04-19 ENCOUNTER — Encounter (HOSPITAL_COMMUNITY): Payer: Self-pay | Admitting: Emergency Medicine

## 2018-04-19 ENCOUNTER — Emergency Department (HOSPITAL_COMMUNITY): Payer: Medicare Other

## 2018-04-19 ENCOUNTER — Other Ambulatory Visit: Payer: Self-pay

## 2018-04-19 ENCOUNTER — Inpatient Hospital Stay (HOSPITAL_COMMUNITY)
Admission: EM | Admit: 2018-04-19 | Discharge: 2018-04-26 | DRG: 389 | Disposition: A | Discharge status: Skilled Nursing Facility | Payer: Medicare Other | Attending: Internal Medicine | Admitting: Internal Medicine

## 2018-04-19 DIAGNOSIS — Z66 Do not resuscitate: Secondary | ICD-10-CM | POA: Diagnosis present

## 2018-04-19 DIAGNOSIS — N179 Acute kidney failure, unspecified: Secondary | ICD-10-CM | POA: Diagnosis not present

## 2018-04-19 DIAGNOSIS — F329 Major depressive disorder, single episode, unspecified: Secondary | ICD-10-CM | POA: Diagnosis present

## 2018-04-19 DIAGNOSIS — K6289 Other specified diseases of anus and rectum: Secondary | ICD-10-CM | POA: Diagnosis not present

## 2018-04-19 DIAGNOSIS — R1031 Right lower quadrant pain: Secondary | ICD-10-CM | POA: Diagnosis not present

## 2018-04-19 DIAGNOSIS — I10 Essential (primary) hypertension: Secondary | ICD-10-CM | POA: Diagnosis present

## 2018-04-19 DIAGNOSIS — R1032 Left lower quadrant pain: Secondary | ICD-10-CM | POA: Diagnosis not present

## 2018-04-19 DIAGNOSIS — K648 Other hemorrhoids: Secondary | ICD-10-CM | POA: Diagnosis present

## 2018-04-19 DIAGNOSIS — Z888 Allergy status to other drugs, medicaments and biological substances status: Secondary | ICD-10-CM

## 2018-04-19 DIAGNOSIS — K5900 Constipation, unspecified: Secondary | ICD-10-CM | POA: Diagnosis not present

## 2018-04-19 DIAGNOSIS — M1990 Unspecified osteoarthritis, unspecified site: Secondary | ICD-10-CM | POA: Diagnosis present

## 2018-04-19 DIAGNOSIS — E785 Hyperlipidemia, unspecified: Secondary | ICD-10-CM | POA: Diagnosis present

## 2018-04-19 DIAGNOSIS — K5641 Fecal impaction: Secondary | ICD-10-CM | POA: Diagnosis not present

## 2018-04-19 DIAGNOSIS — I69354 Hemiplegia and hemiparesis following cerebral infarction affecting left non-dominant side: Secondary | ICD-10-CM

## 2018-04-19 DIAGNOSIS — F419 Anxiety disorder, unspecified: Secondary | ICD-10-CM | POA: Diagnosis not present

## 2018-04-19 DIAGNOSIS — Z885 Allergy status to narcotic agent status: Secondary | ICD-10-CM

## 2018-04-19 DIAGNOSIS — D7589 Other specified diseases of blood and blood-forming organs: Secondary | ICD-10-CM | POA: Diagnosis present

## 2018-04-19 DIAGNOSIS — N200 Calculus of kidney: Secondary | ICD-10-CM | POA: Diagnosis not present

## 2018-04-19 DIAGNOSIS — Z6841 Body Mass Index (BMI) 40.0 and over, adult: Secondary | ICD-10-CM | POA: Diagnosis not present

## 2018-04-19 DIAGNOSIS — R110 Nausea: Secondary | ICD-10-CM | POA: Diagnosis not present

## 2018-04-19 DIAGNOSIS — F32A Depression, unspecified: Secondary | ICD-10-CM | POA: Diagnosis present

## 2018-04-19 DIAGNOSIS — R1030 Lower abdominal pain, unspecified: Secondary | ICD-10-CM | POA: Diagnosis not present

## 2018-04-19 DIAGNOSIS — M479 Spondylosis, unspecified: Secondary | ICD-10-CM | POA: Diagnosis present

## 2018-04-19 DIAGNOSIS — G40909 Epilepsy, unspecified, not intractable, without status epilepticus: Secondary | ICD-10-CM

## 2018-04-19 DIAGNOSIS — Z85828 Personal history of other malignant neoplasm of skin: Secondary | ICD-10-CM

## 2018-04-19 DIAGNOSIS — Z79899 Other long term (current) drug therapy: Secondary | ICD-10-CM

## 2018-04-19 HISTORY — DX: Other specified diseases of anus and rectum: K62.89

## 2018-04-19 LAB — CBC
HCT: 35.6 % — ABNORMAL LOW (ref 36.0–46.0)
Hemoglobin: 11.6 g/dL — ABNORMAL LOW (ref 12.0–15.0)
MCH: 33.3 pg (ref 26.0–34.0)
MCHC: 32.6 g/dL (ref 30.0–36.0)
MCV: 102.3 fL — ABNORMAL HIGH (ref 78.0–100.0)
Platelets: 215 10*3/uL (ref 150–400)
RBC: 3.48 MIL/uL — ABNORMAL LOW (ref 3.87–5.11)
RDW: 12.4 % (ref 11.5–15.5)
WBC: 11 10*3/uL — ABNORMAL HIGH (ref 4.0–10.5)

## 2018-04-19 LAB — COMPREHENSIVE METABOLIC PANEL
ALK PHOS: 67 U/L (ref 38–126)
ALT: 22 U/L (ref 0–44)
AST: 17 U/L (ref 15–41)
Albumin: 3.6 g/dL (ref 3.5–5.0)
Anion gap: 10 (ref 5–15)
BUN: 30 mg/dL — ABNORMAL HIGH (ref 8–23)
CALCIUM: 8.9 mg/dL (ref 8.9–10.3)
CO2: 24 mmol/L (ref 22–32)
Chloride: 102 mmol/L (ref 98–111)
Creatinine, Ser: 1.48 mg/dL — ABNORMAL HIGH (ref 0.44–1.00)
GFR calc Af Amer: 37 mL/min — ABNORMAL LOW (ref >60)
GFR calc non Af Amer: 32 mL/min — ABNORMAL LOW (ref >60)
Glucose, Bld: 141 mg/dL — ABNORMAL HIGH (ref 70–99)
Potassium: 3.7 mmol/L (ref 3.5–5.1)
Sodium: 136 mmol/L (ref 135–145)
Total Bilirubin: 0.6 mg/dL (ref 0.3–1.2)
Total Protein: 6.6 g/dL (ref 6.5–8.1)

## 2018-04-19 LAB — LIPASE, BLOOD: Lipase: 34 U/L (ref 11–51)

## 2018-04-19 MED ORDER — METRONIDAZOLE IN NACL 5-0.79 MG/ML-% IV SOLN
500.0000 mg | Freq: Once | INTRAVENOUS | Status: AC
  Administered 2018-04-19: 500 mg via INTRAVENOUS
  Filled 2018-04-19: qty 100

## 2018-04-19 MED ORDER — IOHEXOL 300 MG/ML  SOLN
100.0000 mL | Freq: Once | INTRAMUSCULAR | Status: AC | PRN
  Administered 2018-04-19: 80 mL via INTRAVENOUS

## 2018-04-19 MED ORDER — SODIUM CHLORIDE 0.9 % IV SOLN
2.0000 g | Freq: Once | INTRAVENOUS | Status: AC
  Administered 2018-04-19: 2 g via INTRAVENOUS
  Filled 2018-04-19: qty 20

## 2018-04-19 MED ORDER — FENTANYL CITRATE (PF) 100 MCG/2ML IJ SOLN
25.0000 ug | Freq: Once | INTRAMUSCULAR | Status: AC
  Administered 2018-04-19: 25 ug via INTRAVENOUS
  Filled 2018-04-19: qty 2

## 2018-04-19 MED ORDER — SODIUM CHLORIDE 0.9 % IV BOLUS
1000.0000 mL | Freq: Once | INTRAVENOUS | Status: AC
  Administered 2018-04-19: 1000 mL via INTRAVENOUS

## 2018-04-19 NOTE — ED Notes (Signed)
Attempted report 

## 2018-04-19 NOTE — Progress Notes (Signed)
Report received from Megan, RN

## 2018-04-19 NOTE — Telephone Encounter (Signed)
I have plenty of openings. She just needs to schedule an appt.

## 2018-04-19 NOTE — ED Notes (Signed)
Pt alert and with family @ bedside VS documented Call light within reach

## 2018-04-19 NOTE — ED Provider Notes (Signed)
Patient placed in Quick Look pathway, seen and evaluated   Chief Complaint: abdominal pain, constipation  HPI:   Carol Page is a 82 y.o. female who presents to the ED with abdominal pain. Pt reports having hx of hemorrhoids x 1 week, used preparation H, reports relief. Pt reports no BM since 9/26, given lactulose and 3 fleet enemas with no relief. Pt report lower abdominal cramping since 9/26. Pt denies nausea/vomiting. Pt denies burning with urination.  ROS: GI: abdominal pain  Physical Exam:  BP (!) 128/99   Pulse 81   Temp 98.5 F (36.9 C) (Oral)   Resp 20   SpO2 93%    Gen: No distress  Neuro: Awake and Alert  Skin: Warm and dry  Abdomen: soft, tender with palpation LLQ     Initiation of care has begun. The patient has been counseled on the process, plan, and necessity for staying for the completion/evaluation, and the remainder of the medical screening examination    Ashley Murrain, NP 04/19/18 1552    Duffy Bruce, MD 04/20/18 0004

## 2018-04-19 NOTE — ED Triage Notes (Signed)
Pt reports having hx of hemorrhoids x 1 week, used preparation H, reports relief. Pt reports no BM since 9/26, given lactulose and 3 fleet enemas with no relief. Pt report lower abdominal cramping since 9/26. Pt denies nausea/vomiting. Pt denies burning with urination.

## 2018-04-19 NOTE — ED Notes (Signed)
Attempted to obtain urine sample pt was unable to hold urine. Is aware of needing a sample.

## 2018-04-19 NOTE — ED Provider Notes (Signed)
[facility] EMERGENCY DEPARTMENT Provider Note   CSN: [id number] Arrival date & time: 04/19/18  1532     History   Chief Complaint Chief Complaint  Patient presents with  . Abdominal Pain  . Constipation    HPI Carol Page is a 82 y.o. female.  HPI 82 year old female here with severe rectal pain.  Patient reportedly has had no bowel movement for the last 5 to 6 days.  She has been using enemas and Preparation H.  She also had significantly increased rectal pain that she describes as a sharp, stabbing sensation.  She is not wanting to eat or drink because it worsens the pain.  She is had no fevers but has had chills.  She lives with her daughter, who is a Marine scientist, is been trying to manage at home.  Patient has been essentially unable to move due to pain and persistent sensation that she needs to go to the bathroom.  She subsequently is brought to the ED.  Pain is worse with walking or trying to have bowel movement.  No relieving factors.  Past Medical History:  Diagnosis Date  . Allergy   . Arthritis   . Cellulitis of left leg   . Depression   . History of blood transfusion   . Hyperlipidemia   . Hypertension   . Pre-diabetes   . Skin cancer of nose   . Stroke ([place])   . Ulcer   . Urinary incontinence     Patient Active Problem List   Diagnosis Date Noted  . Proctitis 04/19/2018  . AKI (acute kidney injury) ([place]) 08/13/2015  . Anxiety and depression 10/06/2013  . Urinary incontinence 10/06/2013  . Chronic leg pain 10/06/2013  . History of CVA (cerebrovascular accident) 10/06/2013  . Essential hypertension 11/09/2012  . HLD (hyperlipidemia) 11/09/2012  . Severe obesity (BMI >= 40) ([place]) 11/09/2012  . Pre-diabetes 11/09/2012  . Seizure disorder ([place]) 11/09/2012    Past Surgical History:  Procedure Laterality Date  . BILATERAL OOPHORECTOMY  06/2012   ovarian cysts  . SKIN CANCER EXCISION  2007   reomved from nose  . TONSILLECTOMY  1944  .  TUBAL LIGATION       OB History   None      Home Medications    Prior to Admission medications   Medication Sig Start Date End Date Taking? Authorizing Provider  acetaminophen (TYLENOL) 500 MG tablet Take 1,000 mg by mouth every 8 (eight) hours.    Yes [provider]  amitriptyline (ELAVIL) 25 MG tablet Take 1 tablet (25 mg total) by mouth at bedtime. 04/05/18  Yes Jearld Fenton, NP  atorvastatin (LIPITOR) 80 MG tablet TAKE 1 TABLET BY MOUTH ONCE DAILY 07/31/17  Yes Baity, Coralie Keens, NP  CALCIUM PO Take 1 tablet by mouth daily.   Yes [provider]  cholecalciferol (VITAMIN D) 1000 units tablet Take 1,000 Units by mouth daily.   Yes [provider]  diltiazem (CARTIA XT) 180 MG 24 hr capsule Take 1 capsule (180 mg total) by mouth daily. 04/05/18  Yes Baity, Coralie Keens, NP  escitalopram (LEXAPRO) 10 MG tablet TAKE 1 TABLET BY MOUTH ONCE DAILY 07/31/17  Yes Baity, Coralie Keens, NP  gabapentin (NEURONTIN) 100 MG capsule TAKE 2 CAPSULES BY MOUTH TWICE DAILY AT 2PM AND AT BEDTIME Patient taking differently: Take 200 mg by mouth 2 (two) times daily. TAKE 2 CAPSULES BY MOUTH TWICE DAILY AT [facility] AND AT BEDTIME 10/07/17  Yes Baity,  Coralie Keens, NP  gabapentin (NEURONTIN) 300 MG capsule TAKE 1 CAPSULE BY MOUTH IN THE MORNING Patient taking differently: Take 300 mg by mouth every morning.  10/07/17  Yes Baity, Coralie Keens, NP  lactulose (CHRONULAC) 10 GM/15ML solution Take 15 mLs (10 g total) by mouth daily. 10/29/17  Yes Baity, Coralie Keens, NP  lisinopril (PRINIVIL,ZESTRIL) 10 MG tablet Take 1 tablet (10 mg total) by mouth daily. 04/05/18  Yes Jearld Fenton, NP  loratadine (CLARITIN) 10 MG tablet Take 10 mg by mouth daily.   Yes [provider]  LORazepam (ATIVAN) 1 MG tablet TAKE 1 TABLET BY MOUTH ONCE DAILY AS NEEDED Patient taking differently: Take 0.5 mg by mouth daily.  03/18/18  Yes Jearld Fenton, NP  nystatin (MYCOSTATIN/NYSTOP) powder Apply topically 4 (four) times  daily. Patient taking differently: Apply 1 g topically daily as needed (Rash on Groin and Legs).  09/02/17  Yes Baity, Coralie Keens, NP  PHENYTOIN INFATABS 50 MG tablet TAKE 2 TABLETS BY MOUTH ONCE DAILY 01/05/18  Yes Jearld Fenton, NP  traZODone (DESYREL) 50 MG tablet Take 3 tablets (150 mg total) by mouth at bedtime. 03/24/18  Yes Baity, Coralie Keens, NP  vitamin B-12 (CYANOCOBALAMIN) 1000 MCG tablet Take 1,000 mcg by mouth daily.   Yes [provider]  clopidogrel (PLAVIX) 75 MG tablet TAKE 1 TABLET BY MOUTH IN THE EVENING Patient not taking: Reported on 04/19/2018 06/07/17   Jearld Fenton, NP  hydrochlorothiazide (MICROZIDE) 12.5 MG capsule Take 1 capsule (12.5 mg total) by mouth daily. Patient not taking: Reported on 04/19/2018 05/12/16   Jearld Fenton, NP  hydrochlorothiazide (MICROZIDE) 12.5 MG capsule TAKE 1 CAPSULE BY MOUTH ONCE DAILY Patient not taking: Reported on 04/19/2018 03/18/18   Jearld Fenton, NP    Family History Family History  Problem Relation Age of Onset  . Heart disease Other        Parent  . Cancer Maternal Uncle     Social History Social History   Tobacco Use  . Smoking status: Never Smoker  . Smokeless tobacco: Never Used  Substance Use Topics  . Alcohol use: No  . Drug use: No     Allergies   Codeine and Ether   Review of Systems Review of Systems  Constitutional: Positive for fatigue. Negative for chills and fever.  HENT: Negative for congestion and rhinorrhea.   Eyes: Negative for visual disturbance.  Respiratory: Negative for cough, shortness of breath and wheezing.   Cardiovascular: Negative for chest pain and leg swelling.  Gastrointestinal: Positive for abdominal pain, anal bleeding and nausea. Negative for diarrhea and vomiting.  Genitourinary: Negative for dysuria and flank pain.  Musculoskeletal: Negative for neck pain and neck stiffness.  Skin: Negative for rash and wound.  Allergic/Immunologic: Negative for immunocompromised  state.  Neurological: Negative for syncope, weakness and headaches.  All other systems reviewed and are negative.    Physical Exam Updated Vital Signs BP (!) 165/64   Pulse 70   Temp 98.5 F (36.9 C) (Oral)   Resp 18   SpO2 95%   Physical Exam  Constitutional: She is oriented to person, place, and time. She appears well-developed and well-nourished. No distress.  HENT:  Head: Normocephalic and atraumatic.  Eyes: Conjunctivae are normal.  Neck: Neck supple.  Cardiovascular: Normal rate, regular rhythm and normal heart sounds. Exam reveals no friction rub.  No murmur heard. Pulmonary/Chest: Effort normal and breath sounds normal. No respiratory distress. She has no wheezes.  She has no rales.  Abdominal: She exhibits no distension. There is tenderness in the right lower quadrant, suprapubic area and left lower quadrant. There is guarding.  Genitourinary:  Genitourinary Comments: Multiple large, prolapsed, internal hemorrhoids that are reducible.  No active bleeding.  Musculoskeletal: She exhibits no edema.  Neurological: She is alert and oriented to person, place, and time. She exhibits normal muscle tone.  Skin: Skin is warm. Capillary refill takes less than 2 seconds.  Psychiatric: She has a normal mood and affect.  Nursing note and vitals reviewed.    ED Treatments / Results  Labs (all labs ordered are listed, but only abnormal results are displayed) Labs Reviewed  COMPREHENSIVE METABOLIC PANEL - Abnormal; Notable for the following components:      Result Value   Glucose, Bld 141 (*)    BUN 30 (*)    Creatinine, Ser 1.48 (*)    GFR calc non Af Amer 32 (*)    GFR calc Af Amer 37 (*)    All other components within normal limits  CBC - Abnormal; Notable for the following components:   WBC 11.0 (*)    RBC 3.48 (*)    Hemoglobin 11.6 (*)    HCT 35.6 (*)    MCV 102.3 (*)    All other components within normal limits  LIPASE, BLOOD  URINALYSIS, ROUTINE W REFLEX  MICROSCOPIC    EKG None  Radiology Ct Abdomen Pelvis W Contrast  Result Date: 04/19/2018 CLINICAL DATA:  Hemorrhoids without relief for 1 week. Abdominal distension. EXAM: CT ABDOMEN AND PELVIS WITH CONTRAST TECHNIQUE: Multidetector CT imaging of the abdomen and pelvis was performed using the standard protocol following bolus administration of intravenous contrast. CONTRAST:  37mL OMNIPAQUE IOHEXOL 300 MG/ML  SOLN COMPARISON:  None. FINDINGS: Lower chest: Mild atelectasis of posterior lung bases are noted. The heart size is normal. Hepatobiliary: 5 mm cyst is identified in the right lobe liver. The liver is otherwise unremarkable. The gallbladder is normal. The biliary tree is normal. Pancreas: Unremarkable. No pancreatic ductal dilatation or surrounding inflammatory changes. Spleen: Normal in size without focal abnormality. Adrenals/Urinary Tract: The adrenal glands are normal. Simple cysts is identified in the midpole right kidney. There is a 2 mm nonobstructing stone in lower pole left kidney. No hydronephrosis is identified bilaterally. The bladder is normal. Stomach/Bowel: There is a small hiatal hernia. The stomach is otherwise normal. There is no small bowel obstruction. No colonic obstruction is identified. The appendix is not seen but no inflammation is noted around cecum. There is generalized edema and swelling in the distal rectal wall. Vascular/Lymphatic: Aortic atherosclerosis. No enlarged abdominal or pelvic lymph nodes. Reproductive: Uterus and bilateral adnexa are unremarkable. Other: None. Musculoskeletal: Degenerative joint changes of the spine are identified. Degenerative anterolisthesis of L5 on S1 is noted. IMPRESSION: Generalized edema and swelling in the distal rectal wall, this can reflect internal hemorrhoids. Liver and right kidney cysts. 2 mm nonobstructing stone in lower pole left kidney. Electronically Signed   By: Abelardo Diesel M.D.   On: 04/19/2018 18:24     Procedures Procedures (including critical care time)  Medications Ordered in ED Medications  iohexol (OMNIPAQUE) 300 MG/ML solution 100 mL (80 mLs Intravenous Contrast Given 04/19/18 1734)  sodium chloride 0.9 % bolus 1,000 mL (0 mLs Intravenous Stopped 04/19/18 2127)  cefTRIAXone (ROCEPHIN) 2 g in sodium chloride 0.9 % 100 mL IVPB (0 g Intravenous Stopped 04/19/18 2022)  metroNIDAZOLE (FLAGYL) IVPB 500 mg (0 mg Intravenous Stopped 04/19/18  2127)  fentaNYL (SUBLIMAZE) injection 25 mcg (25 mcg Intravenous Given 04/19/18 2154)     Initial Impression / Assessment and Plan / ED Course  I have reviewed the triage vital signs and the nursing notes.  Pertinent labs & imaging results that were available during my care of the patient were reviewed by me and considered in my medical decision making (see chart for details).     82 year old female here with severe rectal pain.  Lab work shows moderate leukocytosis and likely prerenal AKI.  CT scan shows stranding about the rectum, likely due to a mild proctitis.  This could be secondary to her bowel regimen, but clinically, given her white count and AKI, concern for possible infectious proctitis or colitis.  Will cover empirically.  Patient with persistent, severe pain in the ED and family states she has been essentially unable to move about the house.  Will admit for fluids, monitoring.  Final Clinical Impressions(s) / ED Diagnoses   Final diagnoses:  Proctitis      Duffy Bruce, MD 04/19/18 2321

## 2018-04-20 ENCOUNTER — Encounter (HOSPITAL_COMMUNITY): Payer: Self-pay | Admitting: Internal Medicine

## 2018-04-20 DIAGNOSIS — K5641 Fecal impaction: Secondary | ICD-10-CM | POA: Diagnosis present

## 2018-04-20 DIAGNOSIS — R609 Edema, unspecified: Secondary | ICD-10-CM | POA: Diagnosis not present

## 2018-04-20 DIAGNOSIS — E785 Hyperlipidemia, unspecified: Secondary | ICD-10-CM | POA: Diagnosis present

## 2018-04-20 DIAGNOSIS — M479 Spondylosis, unspecified: Secondary | ICD-10-CM | POA: Diagnosis present

## 2018-04-20 DIAGNOSIS — K6289 Other specified diseases of anus and rectum: Secondary | ICD-10-CM | POA: Diagnosis not present

## 2018-04-20 DIAGNOSIS — G4700 Insomnia, unspecified: Secondary | ICD-10-CM | POA: Diagnosis not present

## 2018-04-20 DIAGNOSIS — R2681 Unsteadiness on feet: Secondary | ICD-10-CM | POA: Diagnosis not present

## 2018-04-20 DIAGNOSIS — R21 Rash and other nonspecific skin eruption: Secondary | ICD-10-CM | POA: Diagnosis not present

## 2018-04-20 DIAGNOSIS — G459 Transient cerebral ischemic attack, unspecified: Secondary | ICD-10-CM | POA: Diagnosis not present

## 2018-04-20 DIAGNOSIS — R278 Other lack of coordination: Secondary | ICD-10-CM | POA: Diagnosis not present

## 2018-04-20 DIAGNOSIS — K5900 Constipation, unspecified: Secondary | ICD-10-CM | POA: Diagnosis present

## 2018-04-20 DIAGNOSIS — Z7901 Long term (current) use of anticoagulants: Secondary | ICD-10-CM | POA: Diagnosis not present

## 2018-04-20 DIAGNOSIS — D7589 Other specified diseases of blood and blood-forming organs: Secondary | ICD-10-CM | POA: Diagnosis present

## 2018-04-20 DIAGNOSIS — F419 Anxiety disorder, unspecified: Secondary | ICD-10-CM | POA: Diagnosis not present

## 2018-04-20 DIAGNOSIS — K649 Unspecified hemorrhoids: Secondary | ICD-10-CM | POA: Diagnosis not present

## 2018-04-20 DIAGNOSIS — T7840XD Allergy, unspecified, subsequent encounter: Secondary | ICD-10-CM | POA: Diagnosis not present

## 2018-04-20 DIAGNOSIS — R109 Unspecified abdominal pain: Secondary | ICD-10-CM | POA: Diagnosis not present

## 2018-04-20 DIAGNOSIS — L290 Pruritus ani: Secondary | ICD-10-CM | POA: Diagnosis not present

## 2018-04-20 DIAGNOSIS — G9389 Other specified disorders of brain: Secondary | ICD-10-CM | POA: Diagnosis not present

## 2018-04-20 DIAGNOSIS — Z885 Allergy status to narcotic agent status: Secondary | ICD-10-CM | POA: Diagnosis not present

## 2018-04-20 DIAGNOSIS — I10 Essential (primary) hypertension: Secondary | ICD-10-CM | POA: Diagnosis present

## 2018-04-20 DIAGNOSIS — K5901 Slow transit constipation: Secondary | ICD-10-CM | POA: Diagnosis not present

## 2018-04-20 DIAGNOSIS — Z85828 Personal history of other malignant neoplasm of skin: Secondary | ICD-10-CM | POA: Diagnosis not present

## 2018-04-20 DIAGNOSIS — F329 Major depressive disorder, single episode, unspecified: Secondary | ICD-10-CM | POA: Diagnosis not present

## 2018-04-20 DIAGNOSIS — M6281 Muscle weakness (generalized): Secondary | ICD-10-CM | POA: Diagnosis not present

## 2018-04-20 DIAGNOSIS — R1030 Lower abdominal pain, unspecified: Secondary | ICD-10-CM | POA: Diagnosis not present

## 2018-04-20 DIAGNOSIS — G40909 Epilepsy, unspecified, not intractable, without status epilepticus: Secondary | ICD-10-CM | POA: Diagnosis not present

## 2018-04-20 DIAGNOSIS — M1990 Unspecified osteoarthritis, unspecified site: Secondary | ICD-10-CM | POA: Diagnosis present

## 2018-04-20 DIAGNOSIS — Z6841 Body Mass Index (BMI) 40.0 and over, adult: Secondary | ICD-10-CM | POA: Diagnosis not present

## 2018-04-20 DIAGNOSIS — Z888 Allergy status to other drugs, medicaments and biological substances status: Secondary | ICD-10-CM | POA: Diagnosis not present

## 2018-04-20 DIAGNOSIS — Z79899 Other long term (current) drug therapy: Secondary | ICD-10-CM | POA: Diagnosis not present

## 2018-04-20 DIAGNOSIS — K5909 Other constipation: Secondary | ICD-10-CM | POA: Diagnosis not present

## 2018-04-20 DIAGNOSIS — K648 Other hemorrhoids: Secondary | ICD-10-CM | POA: Diagnosis present

## 2018-04-20 DIAGNOSIS — R202 Paresthesia of skin: Secondary | ICD-10-CM | POA: Diagnosis not present

## 2018-04-20 DIAGNOSIS — M79605 Pain in left leg: Secondary | ICD-10-CM | POA: Diagnosis not present

## 2018-04-20 DIAGNOSIS — Z66 Do not resuscitate: Secondary | ICD-10-CM | POA: Diagnosis present

## 2018-04-20 DIAGNOSIS — I69354 Hemiplegia and hemiparesis following cerebral infarction affecting left non-dominant side: Secondary | ICD-10-CM | POA: Diagnosis not present

## 2018-04-20 DIAGNOSIS — N179 Acute kidney failure, unspecified: Secondary | ICD-10-CM | POA: Diagnosis not present

## 2018-04-20 LAB — CBC
HEMATOCRIT: 32.4 % — AB (ref 36.0–46.0)
HEMOGLOBIN: 10.6 g/dL — AB (ref 12.0–15.0)
MCH: 33.3 pg (ref 26.0–34.0)
MCHC: 32.7 g/dL (ref 30.0–36.0)
MCV: 101.9 fL — ABNORMAL HIGH (ref 78.0–100.0)
Platelets: 207 10*3/uL (ref 150–400)
RBC: 3.18 MIL/uL — AB (ref 3.87–5.11)
RDW: 12.4 % (ref 11.5–15.5)
WBC: 10.9 10*3/uL — AB (ref 4.0–10.5)

## 2018-04-20 LAB — BASIC METABOLIC PANEL
ANION GAP: 9 (ref 5–15)
BUN: 28 mg/dL — ABNORMAL HIGH (ref 8–23)
CO2: 25 mmol/L (ref 22–32)
Calcium: 8.2 mg/dL — ABNORMAL LOW (ref 8.9–10.3)
Chloride: 105 mmol/L (ref 98–111)
Creatinine, Ser: 1.28 mg/dL — ABNORMAL HIGH (ref 0.44–1.00)
GFR calc non Af Amer: 38 mL/min — ABNORMAL LOW (ref >60)
GFR, EST AFRICAN AMERICAN: 45 mL/min — AB (ref >60)
Glucose, Bld: 118 mg/dL — ABNORMAL HIGH (ref 70–99)
POTASSIUM: 3.5 mmol/L (ref 3.5–5.1)
SODIUM: 139 mmol/L (ref 135–145)

## 2018-04-20 MED ORDER — HYDROMORPHONE HCL 1 MG/ML IJ SOLN: 0.5000 mg | INTRAMUSCULAR | Status: DC | PRN

## 2018-04-20 MED ORDER — ENOXAPARIN SODIUM 40 MG/0.4ML ~~LOC~~ SOLN
40.0000 mg | SUBCUTANEOUS | Status: DC
  Filled 2018-04-20 (×4): qty 0.4

## 2018-04-20 MED ORDER — METOCLOPRAMIDE HCL 5 MG/ML IJ SOLN
10.0000 mg | Freq: Once | INTRAMUSCULAR | Status: AC
  Administered 2018-04-20: 10 mg via INTRAVENOUS
  Filled 2018-04-20: qty 2

## 2018-04-20 MED ORDER — PHENYTOIN 50 MG PO CHEW
100.0000 mg | CHEWABLE_TABLET | Freq: Every day | ORAL | Status: DC
  Administered 2018-04-20 – 2018-04-26 (×7): 100 mg via ORAL
  Filled 2018-04-20 (×7): qty 2

## 2018-04-20 MED ORDER — ACETAMINOPHEN 325 MG PO TABS: 650.0000 mg | ORAL_TABLET | Freq: Four times a day (QID) | ORAL | Status: DC | PRN

## 2018-04-20 MED ORDER — POLYETHYLENE GLYCOL 3350 17 GM/SCOOP PO POWD: 1.0000 | Freq: Once | ORAL | Status: DC

## 2018-04-20 MED ORDER — POLYETHYLENE GLYCOL 3350 17 GM/SCOOP PO POWD
1.0000 | Freq: Once | ORAL | Status: AC
  Administered 2018-04-20: 238 g via ORAL
  Filled 2018-04-20: qty 255

## 2018-04-20 MED ORDER — ESCITALOPRAM OXALATE 10 MG PO TABS
10.0000 mg | ORAL_TABLET | Freq: Every day | ORAL | Status: DC
  Administered 2018-04-20 – 2018-04-26 (×7): 10 mg via ORAL
  Filled 2018-04-20 (×7): qty 1

## 2018-04-20 MED ORDER — SODIUM CHLORIDE 0.9 % IV SOLN
INTRAVENOUS | Status: DC
  Administered 2018-04-20 (×2): via INTRAVENOUS

## 2018-04-20 MED ORDER — TRAZODONE HCL 150 MG PO TABS
150.0000 mg | ORAL_TABLET | Freq: Every day | ORAL | Status: DC
  Administered 2018-04-20 – 2018-04-25 (×7): 150 mg via ORAL
  Filled 2018-04-20 (×7): qty 1

## 2018-04-20 MED ORDER — GABAPENTIN 100 MG PO CAPS
200.0000 mg | ORAL_CAPSULE | Freq: Two times a day (BID) | ORAL | Status: DC
  Administered 2018-04-20 – 2018-04-26 (×14): 200 mg via ORAL
  Filled 2018-04-20 (×14): qty 2

## 2018-04-20 MED ORDER — ACETAMINOPHEN 500 MG PO TABS
1000.0000 mg | ORAL_TABLET | Freq: Three times a day (TID) | ORAL | Status: DC
  Administered 2018-04-20 – 2018-04-26 (×20): 1000 mg via ORAL
  Filled 2018-04-20 (×20): qty 2

## 2018-04-20 MED ORDER — LORATADINE 10 MG PO TABS
10.0000 mg | ORAL_TABLET | Freq: Every day | ORAL | Status: DC
  Administered 2018-04-20 – 2018-04-26 (×7): 10 mg via ORAL
  Filled 2018-04-20 (×7): qty 1

## 2018-04-20 MED ORDER — SODIUM CHLORIDE 0.9 % IV SOLN
1.0000 g | INTRAVENOUS | Status: DC
  Filled 2018-04-20: qty 10

## 2018-04-20 MED ORDER — BISACODYL 5 MG PO TBEC: 20.0000 mg | DELAYED_RELEASE_TABLET | Freq: Every day | ORAL | Status: DC | PRN

## 2018-04-20 MED ORDER — METRONIDAZOLE IN NACL 5-0.79 MG/ML-% IV SOLN
500.0000 mg | Freq: Three times a day (TID) | INTRAVENOUS | Status: DC
  Administered 2018-04-20 (×2): 500 mg via INTRAVENOUS
  Filled 2018-04-20 (×3): qty 100

## 2018-04-20 MED ORDER — HYDRALAZINE HCL 20 MG/ML IJ SOLN: 10.0000 mg | Freq: Three times a day (TID) | INTRAMUSCULAR | Status: DC | PRN

## 2018-04-20 MED ORDER — DILTIAZEM HCL ER COATED BEADS 180 MG PO CP24
180.0000 mg | ORAL_CAPSULE | Freq: Every day | ORAL | Status: DC
  Administered 2018-04-20 – 2018-04-26 (×7): 180 mg via ORAL
  Filled 2018-04-20 (×7): qty 1

## 2018-04-20 MED ORDER — AMITRIPTYLINE HCL 25 MG PO TABS
25.0000 mg | ORAL_TABLET | Freq: Every day | ORAL | Status: DC
  Administered 2018-04-20 – 2018-04-25 (×7): 25 mg via ORAL
  Filled 2018-04-20 (×7): qty 1

## 2018-04-20 MED ORDER — LORAZEPAM 0.5 MG PO TABS
0.5000 mg | ORAL_TABLET | Freq: Every day | ORAL | Status: DC
  Administered 2018-04-20 – 2018-04-26 (×7): 0.5 mg via ORAL
  Filled 2018-04-20 (×7): qty 1

## 2018-04-20 MED ORDER — GABAPENTIN 300 MG PO CAPS
300.0000 mg | ORAL_CAPSULE | ORAL | Status: DC
  Administered 2018-04-20 – 2018-04-26 (×7): 300 mg via ORAL
  Filled 2018-04-20 (×7): qty 1

## 2018-04-20 MED ORDER — NYSTATIN 100000 UNIT/GM EX POWD
1.0000 g | Freq: Every day | CUTANEOUS | Status: DC | PRN
  Administered 2018-04-20: 1 g via TOPICAL
  Filled 2018-04-20: qty 15

## 2018-04-20 MED ORDER — ONDANSETRON HCL 4 MG/2ML IJ SOLN: 4.0000 mg | Freq: Four times a day (QID) | INTRAMUSCULAR | Status: DC | PRN

## 2018-04-20 MED ORDER — ACETAMINOPHEN 650 MG RE SUPP: 650.0000 mg | Freq: Four times a day (QID) | RECTAL | Status: DC | PRN

## 2018-04-20 MED ORDER — LACTULOSE 10 GM/15ML PO SOLN
20.0000 g | Freq: Three times a day (TID) | ORAL | Status: DC
  Administered 2018-04-20 (×2): 20 g via ORAL
  Filled 2018-04-20 (×2): qty 30

## 2018-04-20 MED ORDER — LACTULOSE 10 GM/15ML PO SOLN
20.0000 g | Freq: Three times a day (TID) | ORAL | Status: DC
  Administered 2018-04-21 – 2018-04-22 (×4): 20 g via ORAL
  Filled 2018-04-20 (×4): qty 30

## 2018-04-20 MED ORDER — ONDANSETRON HCL 4 MG PO TABS: 4.0000 mg | ORAL_TABLET | Freq: Four times a day (QID) | ORAL | Status: DC | PRN

## 2018-04-20 MED ORDER — ATORVASTATIN CALCIUM 80 MG PO TABS
80.0000 mg | ORAL_TABLET | Freq: Every day | ORAL | Status: DC
  Administered 2018-04-20 – 2018-04-26 (×7): 80 mg via ORAL
  Filled 2018-04-20 (×7): qty 1

## 2018-04-20 NOTE — Progress Notes (Signed)
[facility] PROGRESS NOTE  Carol Page CHE:[id number] DOB: Oct 03, 1935 DOA: 04/19/2018 PCP: Jearld Fenton, NP  Assessment/Plan: Proctitis versus inflamed hemorrhoids: hx chronic constipation with frequent use lactulose and enemas. CT reveals generalized edema and swelling in the distal rectal wall. Query proctitis vs hemorrhoids. She is afebrile and non-toxic appearing. Rocephin and flagyl initiated and lactulose increased. No BM yet -continue IV antibiotics -continue full liquids -continue lactulose -gentle IV fluids -requested gi consult  Constipation: long hx of same. See #1.   AKI (acute kidney injury) [facility]): creatinine trending down.  -continue to hold ace inhibitor -gentle IV fluids -monitor urine output -recheck in morning  Essential hypertension: only fair control. Home lisinopril and HCTZ on hold due to above.  -monitor bp closely -prn hydralazine -resume home meds when able.   Severe obesity (BMI >= 40) ([place]): BMI 48.6. Nutritional consult  Seizure disorder [facility]): no seizure activity -continue home med  Anxiety and depression: stable at baseline -continue home meds  Code Status: dnr Family Communication: none present Disposition Plan: home when ready   Consultants:  Sheryn Bison  Procedures:  none  Antibiotics:  Rocephin 10/1/>>>  Flagyl 10/1>>  HPI/Subjective: Carol Page is a 82 y.o. female with medical history significant of chronic constipation, pain, hypertension morbid obesity, and seizure disorder presented to ED on 10/1 with abdominal pain.  Patient lives at home with her daughter and son-in-law [name].  Patient has ongoing constipation.  She takes lactulose at baseline.  Over the previous week she had exacerbation of her symptoms and her last bowel movement was September 26.  They  tried increasing her lactulose as well as numerous fleets enema over previous few days without relief.  A few days prior to admission,  she did have  expulsion of a large hemorrhoid that was reinserted by her daughter.  She started having worsening lower trauma abdominal pain which prompted him to come into the ED.  due to her pain and constipation she has had decreased p.o. intake.  At baseline she walks around minimally with a cane or walker per family.    Objective: Vitals:   04/20/18 0045 04/20/18 0550  BP: (!) 182/55 (!) 147/58  Pulse: 76 75  Resp: 18 18  Temp: 97.6 F (36.4 C) 97.8 F (36.6 C)  SpO2: 95% 94%    Intake/Output Summary (Last 24 hours) at 04/20/2018 1202 Last data filed at 04/20/2018 0915 Gross per 24 hour  Intake 2118.19 ml  Output -  Net 2118.19 ml   Filed Weights   04/20/18 0045  Weight: 121.2 kg    Exam:   General:  Sitting up in bed watching TV. Complains diffuse abdominal pain  Cardiovascular: RRR +murmur trace LE edema PPP  Respiratory: normal effort BS slightly distant but clear bilaterally no wheeze  Abdomen: obese soft, non-distended, sluggish BS mild diffuse tenderness to palpation. No guarding or rebounding  Musculoskeletal:  Joints without swelling/erythema  Data Reviewed: Basic Metabolic Panel: Recent Labs  Lab 04/19/18 1550 04/20/18 0545  NA 136 139  K 3.7 3.5  CL 102 105  CO2 24 25  GLUCOSE 141* 118*  BUN 30* 28*  CREATININE 1.48* 1.28*  CALCIUM 8.9 8.2*   Liver Function Tests: Recent Labs  Lab 04/19/18 1550  AST 17  ALT 22  ALKPHOS 67  BILITOT 0.6  PROT 6.6  ALBUMIN 3.6   Recent Labs  Lab 04/19/18 1550  LIPASE 34   No results for input(s): AMMONIA in the last 168 hours. CBC:  Recent Labs  Lab 04/19/18 1550 04/20/18 0545  WBC 11.0* 10.9*  HGB 11.6* 10.6*  HCT 35.6* 32.4*  MCV 102.3* 101.9*  PLT 215 207   Cardiac Enzymes: No results for input(s): CKTOTAL, CKMB, CKMBINDEX, TROPONINI in the last 168 hours. BNP (last 3 results) No results for input(s): BNP in the last 8760 hours.  ProBNP (last 3 results) No results for input(s): PROBNP in the last  8760 hours.  CBG: No results for input(s): GLUCAP in the last 168 hours.  No results found for this or any previous visit (from the past 240 hour(s)).   Studies: Ct Abdomen Pelvis W Contrast  Result Date: 04/19/2018 CLINICAL DATA:  Hemorrhoids without relief for 1 week. Abdominal distension. EXAM: CT ABDOMEN AND PELVIS WITH CONTRAST TECHNIQUE: Multidetector CT imaging of the abdomen and pelvis was performed using the standard protocol following bolus administration of intravenous contrast. CONTRAST:  77mL OMNIPAQUE IOHEXOL 300 MG/ML  SOLN COMPARISON:  None. FINDINGS: Lower chest: Mild atelectasis of posterior lung bases are noted. The heart size is normal. Hepatobiliary: 5 mm cyst is identified in the right lobe liver. The liver is otherwise unremarkable. The gallbladder is normal. The biliary tree is normal. Pancreas: Unremarkable. No pancreatic ductal dilatation or surrounding inflammatory changes. Spleen: Normal in size without focal abnormality. Adrenals/Urinary Tract: The adrenal glands are normal. Simple cysts is identified in the midpole right kidney. There is a 2 mm nonobstructing stone in lower pole left kidney. No hydronephrosis is identified bilaterally. The bladder is normal. Stomach/Bowel: There is a small hiatal hernia. The stomach is otherwise normal. There is no small bowel obstruction. No colonic obstruction is identified. The appendix is not seen but no inflammation is noted around cecum. There is generalized edema and swelling in the distal rectal wall. Vascular/Lymphatic: Aortic atherosclerosis. No enlarged abdominal or pelvic lymph nodes. Reproductive: Uterus and bilateral adnexa are unremarkable. Other: None. Musculoskeletal: Degenerative joint changes of the spine are identified. Degenerative anterolisthesis of L5 on S1 is noted. IMPRESSION: Generalized edema and swelling in the distal rectal wall, this can reflect internal hemorrhoids. Liver and right kidney cysts. 2 mm  nonobstructing stone in lower pole left kidney. Electronically Signed   By: Abelardo Diesel M.D.   On: 04/19/2018 18:24    Scheduled Meds: . acetaminophen  1,000 mg Oral Q8H  . amitriptyline  25 mg Oral QHS  . atorvastatin  80 mg Oral Daily  . diltiazem  180 mg Oral Daily  . enoxaparin (LOVENOX) injection  40 mg Subcutaneous Q24H  . escitalopram  10 mg Oral Daily  . gabapentin  200 mg Oral BID  . gabapentin  300 mg Oral [id number]  . lactulose  20 g Oral TID  . loratadine  10 mg Oral Daily  . LORazepam  0.5 mg Oral Daily  . phenytoin  100 mg Oral Daily  . traZODone  150 mg Oral QHS   Continuous Infusions: . sodium chloride 75 mL/hr at 04/20/18 0552  . cefTRIAXone (ROCEPHIN)  IV    . metronidazole 500 mg (04/20/18 1035)    Principal Problem:   Proctitis Active Problems:   Essential hypertension   Severe obesity (BMI >= 40) ([facility])   Seizure disorder ([facility])   Anxiety and depression   AKI (acute kidney injury) ([place])   Constipation    Time spent: 45 minutes    [facility]  If 7PM-7AM, please contact night-coverage at [email or web address], password [facility] 04/20/2018, 12:02 PM  LOS: 0 days

## 2018-04-20 NOTE — Consult Note (Signed)
WOC contacted while on the nursing unit about the patient's skin under her pannus. She currently has nystatin ordered, the areas are painful and raw.  Nursing is requesting antimicrobial wicking fabric.  I have added these orders into the computer and sent request to materials for the product.   Re consult if needed, will not follow at this time. Thanks  Cindi Ghazarian [employer], RN,CWOCN, CNS, [place] [phone])

## 2018-04-20 NOTE — Progress Notes (Addendum)
On call [facility] floor coverage made aware that patient wants to stop IVF since drinking a lot of fluid from the bowel prep, had chronic LLE edema +2, electrolytes within normal range, and had multiple Type 1 BMs this afternoon.  [facility] floor coverage Baltazar Najjar responded to hold IVF until 8am and to asked day shift attending MD for new order. Will endorse to day shift RN appropriately.

## 2018-04-20 NOTE — H&P (Signed)
History and Physical    Carol Page KVQ:[id number] DOB: 23-Mar-1936 DOA: 04/19/2018  PCP: Carol Fenton, NP  Patient coming from: home  I have personally briefly reviewed patient's old medical records in [place]  Chief Complaint: abd pain, constipation  HPI: Carol Page is a 82 y.o. female with medical history significant of chronic constipation, pain, hypertension morbid obesity, and seizure disorder presents with abdominal pain.  Patient lives at home with her daughter and son-in-law Carol Page.  Patient has had ongoing constipation.  She takes lactulose at baseline.  Over the past week she has had exacerbation of her symptoms and her last bowel movement was September 26.  They have tried increasing her lactulose as well as numerous fleets enema over the past few days without relief.  A few days ago she did have expulsion of a large hemorrhoid that was reinserted by her daughter.  She started having worsening lower trauma abdominal pain which prompted him to come into the ED.  due to her pain and constipation she has had decreased p.o. intake.  At baseline she walks around minimally with a cane or walker per family.  She denies any fever ,nausea ,vomiting.   ED Course: Patient was found to have inflammation of the rectum on CT scan.  She was given IV Flagyl and Rocephin, fentanyl and 1 L IV fluid.  Review of Systems: Positive for chronic leg pain, denies current chest pain or shortness of breath nausea or vomiting All others reviewed with patient  and are  negative unless otherwise stated   Past Medical History:  Diagnosis Date  . Allergy   . Arthritis   . Cellulitis of left leg   . Depression   . History of blood transfusion   . Hyperlipidemia   . Hypertension   . Pre-diabetes   . Skin cancer of nose   . Stroke ([place])   . Ulcer   . Urinary incontinence constipation Hemorrhoids     Past Surgical History:  Procedure Laterality Date  . BILATERAL OOPHORECTOMY   06/2012   ovarian cysts  . SKIN CANCER EXCISION  2007   reomved from nose  . TONSILLECTOMY  1944  . TUBAL LIGATION       reports that she has never smoked. She has never used smokeless tobacco. She reports that she does not drink alcohol or use drugs.  Allergies  Allergen Reactions  . Codeine Nausea And Vomiting  . Ether Nausea And Vomiting    Family History  Problem Relation Age of Onset  . Heart disease Other        Parent  . Cancer Maternal Uncle     Prior to Admission medications   Medication Sig Start Date End Date Taking? Authorizing Provider  acetaminophen (TYLENOL) 500 MG tablet Take 1,000 mg by mouth every 8 (eight) hours.    Yes [provider]  amitriptyline (ELAVIL) 25 MG tablet Take 1 tablet (25 mg total) by mouth at bedtime. 04/05/18  Yes Carol Fenton, NP  atorvastatin (LIPITOR) 80 MG tablet TAKE 1 TABLET BY MOUTH ONCE DAILY 07/31/17  Yes Baity, Coralie Keens, NP  CALCIUM PO Take 1 tablet by mouth daily.   Yes [provider]  cholecalciferol (VITAMIN D) 1000 units tablet Take 1,000 Units by mouth daily.   Yes [provider]  diltiazem (CARTIA XT) 180 MG 24 hr capsule Take 1 capsule (180 mg total) by mouth daily. 04/05/18  Yes Carol Fenton, NP  escitalopram (LEXAPRO)  10 MG tablet TAKE 1 TABLET BY MOUTH ONCE DAILY 07/31/17  Yes Baity, Coralie Keens, NP  gabapentin (NEURONTIN) 100 MG capsule TAKE 2 CAPSULES BY MOUTH TWICE DAILY AT 2PM AND AT BEDTIME Patient taking differently: Take 200 mg by mouth 2 (two) times daily. TAKE 2 CAPSULES BY MOUTH TWICE DAILY AT [facility] AND AT BEDTIME 10/07/17  Yes Baity, Coralie Keens, NP  gabapentin (NEURONTIN) 300 MG capsule TAKE 1 CAPSULE BY MOUTH IN THE MORNING Patient taking differently: Take 300 mg by mouth every morning.  10/07/17  Yes Baity, Coralie Keens, NP  lisinopril (PRINIVIL,ZESTRIL) 10 MG tablet Take 1 tablet (10 mg total) by mouth daily. 04/05/18  Yes Carol Fenton, NP  loratadine (CLARITIN) 10 MG tablet Take 10 mg by  mouth daily.   Yes [provider]  LORazepam (ATIVAN) 1 MG tablet TAKE 1 TABLET BY MOUTH ONCE DAILY AS NEEDED Patient taking differently: Take 0.5 mg by mouth daily.  03/18/18  Yes Carol Fenton, NP  nystatin (MYCOSTATIN/NYSTOP) powder Apply topically 4 (four) times daily. Patient taking differently: Apply 1 g topically daily as needed (Rash on Groin and Legs).  09/02/17  Yes Baity, Coralie Keens, NP  PHENYTOIN INFATABS 50 MG tablet TAKE 2 TABLETS BY MOUTH ONCE DAILY 01/05/18  Yes Carol Fenton, NP  traZODone (DESYREL) 50 MG tablet Take 3 tablets (150 mg total) by mouth at bedtime. 03/24/18  Yes Baity, Coralie Keens, NP  vitamin B-12 (CYANOCOBALAMIN) 1000 MCG tablet Take 1,000 mcg by mouth daily.   Yes [provider]    06/07/17   Carol Fenton, NP    05/12/16   Carol Fenton, NP    03/18/18   Carol Fenton, NP    Physical Exam: Vitals:   04/19/18 2015 04/19/18 2030 04/19/18 2045 04/19/18 2245  BP: (!) 166/107 (!) 180/92 (!) 161/83 (!) 165/64  Pulse: 80 80 79 70  Resp: 16 17 16 18   Temp:      TempSrc:      SpO2: 95% 96% 96% 95%    Constitutional: NAD, calm, comfortable Vitals:   04/19/18 2015 04/19/18 2030 04/19/18 2045 04/19/18 2245  BP: (!) 166/107 (!) 180/92 (!) 161/83 (!) 165/64  Pulse: 80 80 79 70  Resp: 16 17 16 18   Temp:      TempSrc:      SpO2: 95% 96% 96% 95%   Eyes: PERRL, lids and conjunctivae normal ENMT: Mucous membranes are moist. Posterior pharynx clear of any exudate or lesions.Normal dentition.  Neck: normal, supple, no masses,  Respiratory: clear to auscultation bilaterally, no wheezing, no crackles. Normal respiratory effort. No accessory muscle use.  Cardiovascular: Regular rate and rhythm, positive murmurs + lower nonpitting extremity edema. 1+ pedal pulses. .  Abdomen: Diffusely tender but worse in the lower quadrants no masses palpated. No hepatosplenomegaly. Bowel sounds positive.  Overly obese, no rebound Musculoskeletal: no clubbing /  cyanosis. No joint deformity upper and lower extremities.  no contractures. Normal muscle tone.   Skin: no rashes, lesions, ulcers. No induration Neurologic: CN 2-12 grossly intact. Moves all 4 extremities equally Psychiatric: Normal judgment and insight. Alert and oriented x 3. Normal mood.     Labs on Admission: I have personally reviewed following labs and imaging studies  CBC: Recent Labs  Lab 04/19/18 1550  WBC 11.0*  HGB 11.6*  HCT 35.6*  MCV 102.3*  PLT 329   Basic Metabolic Panel: Recent Labs  Lab 04/19/18 1550  NA 136  K  3.7  CL 102  CO2 24  GLUCOSE 141*  BUN 30*  CREATININE 1.48*  CALCIUM 8.9   GFR: CrCl cannot be calculated (Unknown ideal weight.). Liver Function Tests: Recent Labs  Lab 04/19/18 1550  AST 17  ALT 22  ALKPHOS 67  BILITOT 0.6  PROT 6.6  ALBUMIN 3.6   Recent Labs  Lab 04/19/18 1550  LIPASE 34   No results for input(s): AMMONIA in the last 168 hours. Coagulation Profile: No results for input(s): INR, PROTIME in the last 168 hours. Cardiac Enzymes: No results for input(s): CKTOTAL, CKMB, CKMBINDEX, TROPONINI in the last 168 hours. BNP (last 3 results) No results for input(s): PROBNP in the last 8760 hours. HbA1C: No results for input(s): HGBA1C in the last 72 hours. CBG: No results for input(s): GLUCAP in the last 168 hours. Lipid Profile: No results for input(s): CHOL, HDL, LDLCALC, TRIG, CHOLHDL, LDLDIRECT in the last 72 hours. Thyroid Function Tests: No results for input(s): TSH, T4TOTAL, FREET4, T3FREE, THYROIDAB in the last 72 hours. Anemia Panel: No results for input(s): VITAMINB12, FOLATE, FERRITIN, TIBC, IRON, RETICCTPCT in the last 72 hours. Urine analysis:    Component Value Date/Time   COLORURINE AMBER (A) 08/13/2015 1107   APPEARANCEUR CLEAR 08/13/2015 1107   LABSPEC 1.024 08/13/2015 1107   PHURINE 5.0 08/13/2015 1107   GLUCOSEU NEGATIVE 08/13/2015 1107   GLUCOSEU NEGATIVE 11/09/2012 1537   HGBUR NEGATIVE  08/13/2015 1107   BILIRUBINUR LARGE (A) 08/13/2015 1107   KETONESUR 15 (A) 08/13/2015 1107   PROTEINUR NEGATIVE 08/13/2015 1107   UROBILINOGEN 1.0 05/04/2015 1505   NITRITE POSITIVE (A) 08/13/2015 1107   LEUKOCYTESUR SMALL (A) 08/13/2015 1107    Radiological Exams on Admission: Ct Abdomen Pelvis W Contrast  Result Date: 04/19/2018 CLINICAL DATA:  Hemorrhoids without relief for 1 week. Abdominal distension. EXAM: CT ABDOMEN AND PELVIS WITH CONTRAST TECHNIQUE: Multidetector CT imaging of the abdomen and pelvis was performed using the standard protocol following bolus administration of intravenous contrast. CONTRAST:  55mL OMNIPAQUE IOHEXOL 300 MG/ML  SOLN COMPARISON:  None. FINDINGS: Lower chest: Mild atelectasis of posterior lung bases are noted. The heart size is normal. Hepatobiliary: 5 mm cyst is identified in the right lobe liver. The liver is otherwise unremarkable. The gallbladder is normal. The biliary tree is normal. Pancreas: Unremarkable. No pancreatic ductal dilatation or surrounding inflammatory changes. Spleen: Normal in size without focal abnormality. Adrenals/Urinary Tract: The adrenal glands are normal. Simple cysts is identified in the midpole right kidney. There is a 2 mm nonobstructing stone in lower pole left kidney. No hydronephrosis is identified bilaterally. The bladder is normal. Stomach/Bowel: There is a small hiatal hernia. The stomach is otherwise normal. There is no small bowel obstruction. No colonic obstruction is identified. The appendix is not seen but no inflammation is noted around cecum. There is generalized edema and swelling in the distal rectal wall. Vascular/Lymphatic: Aortic atherosclerosis. No enlarged abdominal or pelvic lymph nodes. Reproductive: Uterus and bilateral adnexa are unremarkable. Other: None. Musculoskeletal: Degenerative joint changes of the spine are identified. Degenerative anterolisthesis of L5 on S1 is noted. IMPRESSION: Generalized edema and  swelling in the distal rectal wall, this can reflect internal hemorrhoids. Liver and right kidney cysts. 2 mm nonobstructing stone in lower pole left kidney. Electronically Signed   By: Abelardo Diesel M.D.   On: 04/19/2018 18:24      Assessment/Plan Principal Problem:   Proctitis versus inflamed hemorrhoids   Constipation  AKI (acute kidney injury) ([place]) Active Problems:  Essential hypertension   Severe obesity (BMI >= 40) (HCC)   Seizure disorder (HCC)   Anxiety and depression   - Continue IV Rocephin and Flagyl.  GI consult in the a.m., supportive care -Increase lactulose, full liquid diet for now, await further recommendations from GI -Continue Home medications for  seizure disorder anxiety and depression. -Hold ACE inhibitor in light of acute kidney injury.  Urinalysis pending  ,PRN IV hydralazine for extremes of blood pressure      DVT prophylaxis: Lovenox Code Status: DNR Family Communication: Son-in-law Carol Page  disposition Plan: Home 2 days  admission status: Observation medical   Plummer Matich Johnson-Pitts MD [facility] Pager [phone]  If 7PM-7AM, please contact night-coverage [email or web address] Password [facility]  04/20/2018, 12:27 AM

## 2018-04-20 NOTE — Consult Note (Addendum)
[facility] Consult: 12:05 PM 04/20/2018  LOS: 0 days    Referring Provider: Dr Posey Pronto  Primary Care Physician:  Jearld Fenton, NP Primary Gastroenterologist: unassigned.       Reason for Consultation:  Proctitis, constipation   HPI: Carol Page is a 82 y.o. female.  Hx PMH morbid obesity.  Htn.  CVA, chronic Plavix.   Seizures.   Depression/anxiety. Acute renal failure in 2015.  DJD spine.   Anemia, Hgb 9.1 in 08/2015, 13 in 05/2017.  Iron prescribed as far back as 2004 but not taking this currently.  On oral B12.  .   Chronic constipation.  Constipation, thickened tranverse, descending wall of colon On CT during 08/2015 admission with UTI, sepsis.  Status post bladder/pelvic sling procedure.  Patient has had constipation issues throughout her lifetime.  Her normal bowel regimen is lactulose every morning.  She generally has a bowel movement every 2 to 3 days which consists of formed, somewhat hard balls of brown stool.  Normally does not see blood with her bowel movements.  At times the stool balls can be the size of lemons.  She can pass stool without realizing it.  If she misses more than 2 or 3 days, her daughter will increase her lactulose.   In the last week she has been increasingly constipated had not had a bowel movement.  Despite increasing the lactulose, administering 2 separate fleets enemas, which she was not able to retain, she had no bowel movement.  At one point a large hemorrhoid "size of a lemmon" emerged and the daughter managed to reinsert.  There has been some bleeding associated with the hemorrhoid but not a large volume of blood.  Gradually developed abdominal pain, starting in the lower abdomen and radiating to the upper abdomen.  No nausea or vomiting. No upper GI symptoms, does not use  PPI or H2 blocker Generally has a good appetite.  Weight in 09/2015 was 221 #/100.2kg, currently 121 kg.    WBCs 11, Hgb 11.6 >> 10.6.  MCV 102.   AKI, improved overnight.   Contrasted CT ab/pelvis: generalized edema and swelling in the distal rectal wall, may reflect internal hemorrhoids. Aortic atherosclerosis.  Liver and renal cysts.    Limited baseline mobility with cane or walker.  Does not drink a lot of water.    Past Medical History:  Diagnosis Date  . Allergy   . Arthritis   . Cellulitis of left leg   . Depression   . History of blood transfusion   . Hyperlipidemia   . Hypertension   . Pre-diabetes   . Proctitis   . Skin cancer of nose   . Stroke ([place])   . Ulcer   . Urinary incontinence     Past Surgical History:  Procedure Laterality Date  . BILATERAL OOPHORECTOMY  06/2012   ovarian cysts  . SKIN CANCER EXCISION  2007   reomved from nose  . TONSILLECTOMY  1944  . TUBAL LIGATION      Prior to Admission medications   Medication Sig  Start Date End Date Taking? Authorizing Provider  acetaminophen (TYLENOL) 500 MG tablet Take 1,000 mg by mouth every 8 (eight) hours.    Yes [provider]  amitriptyline (ELAVIL) 25 MG tablet Take 1 tablet (25 mg total) by mouth at bedtime. 04/05/18  Yes Jearld Fenton, NP  atorvastatin (LIPITOR) 80 MG tablet TAKE 1 TABLET BY MOUTH ONCE DAILY 07/31/17  Yes Baity, Coralie Keens, NP  CALCIUM PO Take 1 tablet by mouth daily.   Yes [provider]  cholecalciferol (VITAMIN D) 1000 units tablet Take 1,000 Units by mouth daily.   Yes [provider]  diltiazem (CARTIA XT) 180 MG 24 hr capsule Take 1 capsule (180 mg total) by mouth daily. 04/05/18  Yes Baity, Coralie Keens, NP  escitalopram (LEXAPRO) 10 MG tablet TAKE 1 TABLET BY MOUTH ONCE DAILY 07/31/17  Yes Baity, Coralie Keens, NP  gabapentin (NEURONTIN) 100 MG capsule TAKE 2 CAPSULES BY MOUTH TWICE DAILY AT 2PM AND AT BEDTIME Patient taking differently: Take 200 mg by mouth 2  (two) times daily. TAKE 2 CAPSULES BY MOUTH TWICE DAILY AT [facility] AND AT BEDTIME 10/07/17  Yes Baity, Coralie Keens, NP  gabapentin (NEURONTIN) 300 MG capsule TAKE 1 CAPSULE BY MOUTH IN THE MORNING Patient taking differently: Take 300 mg by mouth every morning.  10/07/17  Yes Baity, Coralie Keens, NP  lisinopril (PRINIVIL,ZESTRIL) 10 MG tablet Take 1 tablet (10 mg total) by mouth daily. 04/05/18  Yes Jearld Fenton, NP  loratadine (CLARITIN) 10 MG tablet Take 10 mg by mouth daily.   Yes [provider]  LORazepam (ATIVAN) 1 MG tablet TAKE 1 TABLET BY MOUTH ONCE DAILY AS NEEDED Patient taking differently: Take 0.5 mg by mouth daily.  03/18/18  Yes Jearld Fenton, NP  nystatin (MYCOSTATIN/NYSTOP) powder Apply topically 4 (four) times daily. Patient taking differently: Apply 1 g topically daily as needed (Rash on Groin and Legs).  09/02/17  Yes Baity, Coralie Keens, NP  PHENYTOIN INFATABS 50 MG tablet TAKE 2 TABLETS BY MOUTH ONCE DAILY 01/05/18  Yes Jearld Fenton, NP  traZODone (DESYREL) 50 MG tablet Take 3 tablets (150 mg total) by mouth at bedtime. 03/24/18  Yes Baity, Coralie Keens, NP  vitamin B-12 (CYANOCOBALAMIN) 1000 MCG tablet Take 1,000 mcg by mouth daily.   Yes [provider]  clopidogrel (PLAVIX) 75 MG tablet TAKE 1 TABLET BY MOUTH IN THE EVENING Patient not taking: Reported on 04/19/2018 06/07/17   Jearld Fenton, NP  hydrochlorothiazide (MICROZIDE) 12.5 MG capsule Take 1 capsule (12.5 mg total) by mouth daily. Patient not taking: Reported on 04/19/2018 05/12/16   Jearld Fenton, NP  hydrochlorothiazide (MICROZIDE) 12.5 MG capsule TAKE 1 CAPSULE BY MOUTH ONCE DAILY Patient not taking: Reported on 04/19/2018 03/18/18   Jearld Fenton, NP    Scheduled Meds: . acetaminophen  1,000 mg Oral Q8H  . amitriptyline  25 mg Oral QHS  . atorvastatin  80 mg Oral Daily  . diltiazem  180 mg Oral Daily  . enoxaparin (LOVENOX) injection  40 mg Subcutaneous Q24H  . escitalopram  10 mg Oral Daily  .  gabapentin  200 mg Oral BID  . gabapentin  300 mg Oral BH-q7a  . lactulose  20 g Oral TID  . loratadine  10 mg Oral Daily  . LORazepam  0.5 mg Oral Daily  . phenytoin  100 mg Oral Daily  . traZODone  150 mg Oral QHS   Infusions: . sodium chloride 75 mL/hr  at 04/20/18 0552  . cefTRIAXone (ROCEPHIN)  IV    . metronidazole 500 mg (04/20/18 1035)   PRN Meds: acetaminophen **OR** acetaminophen, hydrALAZINE, HYDROmorphone (DILAUDID) injection, nystatin, ondansetron **OR** ondansetron (ZOFRAN) IV   Allergies as of 04/19/2018 - Review Complete 04/19/2018  Allergen Reaction Noted  . Codeine Nausea And Vomiting 11/09/2012  . Ether Nausea And Vomiting 11/09/2012    Family History  Problem Relation Age of Onset  . Heart disease Other        Parent  . Cancer Maternal Uncle     Social History   Socioeconomic History  . Marital status: Widowed    Spouse name: Not on file  . Number of children: 4  . Years of education: 63  . Highest education level: Not on file  Occupational History  . Occupation: Retired  Scientific laboratory technician  . Financial resource strain: Not on file  . Food insecurity:    Worry: Not on file    Inability: Not on file  . Transportation needs:    Medical: Not on file    Non-medical: Not on file  Tobacco Use  . Smoking status: Never Smoker  . Smokeless tobacco: Never Used  Substance and Sexual Activity  . Alcohol use: No  . Drug use: No  . Sexual activity: Never  Lifestyle  . Physical activity:    Days per week: Not on file    Minutes per session: Not on file  . Stress: Not on file  Relationships  . Social connections:    Talks on phone: Not on file    Gets together: Not on file    Attends religious service: Not on file    Active member of club or organization: Not on file    Attends meetings of clubs or organizations: Not on file    Relationship status: Not on file  . Intimate partner violence:    Fear of current or ex partner: Not on file    Emotionally  abused: Not on file    Physically abused: Not on file    Forced sexual activity: Not on file  Other Topics Concern  . Not on file  Social History Narrative   Regular exercise-no   Caffeine Use-yes    REVIEW OF SYSTEMS: Constitutional: In the last week patient has been more weak and on 3 separate occasions she has fallen; some of these occurred after she tried to sit on the commode or on a chair and her but missed the chair or was unable to achieve adequate purchase on the seat. ENT:  No nose bleeds Pulm: No dyspnea.  No cough. CV:  No palpitations, no chest pain.  Chronic lower extremity edema. GU:  No hematuria, no frequency GI:  Per HPI Heme: No excessive bruising or bleeding. Transfusions:  None.   Neuro:  No headaches, no peripheral tingling or numbness.  No seizures rexcently.   Derm: Chronic rash in the folds of her abdominal pannus Endocrine:  No sweats or chills.  No polyuria or dysuria Immunization: Reviewed Travel:  None beyond local counties in last few months.    PHYSICAL EXAM: Vital signs in last 24 hours: Vitals:   04/20/18 0045 04/20/18 0550  BP: (!) 182/55 (!) 147/58  Pulse: 76 75  Resp: 18 18  Temp: 97.6 F (36.4 C) 97.8 F (36.6 C)  SpO2: 95% 94%   Wt Readings from Last 3 Encounters:  04/20/18 121.2 kg  05/25/17 116.1 kg  05/12/16 107.5 kg    General:  Morbidly obese, alert, comfortable, moderately chronically ill-appearing but not acutely toxic or ill.  Elderly WF Head: No facial asymmetry or swelling.  No signs of head trauma Eyes: No scleral icterus.  No conjunctival pallor.  EOMI. Ears: Not hard of hearing Nose: No congestion or discharge Mouth: Dentures in place, not removed for exam.  Tongue midline.  Oral mucosa moist, pink, clear. Neck: No JVD, no masses, no thyromegaly. Lungs: Clear but somewhat distant sounds bilaterally.  No cough or labored breathing. Heart: RRR.  Soft, 1/6 systolic murmur.  S1, S2 present. Abdomen: Obese.  Tender on  the right abdomen without guarding or rebound.  Bowel sounds active without high-pitched or tinkling sounds..   Rectal: Internal hemorrhoids somewhat visible and palpable as boggy texture.  Hard ball of stool sitting in the rectum, this is brown, FOBT negative.  She was tender on exam so no attempt was made to remove the retained stool Musc/Skeltl: No joint redness, swelling or significant deformity/contractures Extremities: Nonpitting lower extremity edema. Neurologic: Alert.  Oriented x3.  Moves all 4 limbs without tremor.  Strength not tested. Skin: Chronic intertrigo dermatitis with erythema and bark-like texture to the folds of abdominal pannus. Tattoos: None Nodes: No cervical adenopathy. Psych: Cooperative, appropriate.  Clear speech  Intake/Output from previous day: 10/01 0701 - 10/02 0700 In: 1638.2 [I.V.:388.2; IV Piggyback:1250] Out: -  Intake/Output this shift: Total I/O In: 480 [P.O.:480] Out: -   LAB RESULTS: Recent Labs    04/19/18 1550 04/20/18 0545  WBC 11.0* 10.9*  HGB 11.6* 10.6*  HCT 35.6* 32.4*  PLT 215 207   BMET Lab Results  Component Value Date   NA 139 04/20/2018   NA 136 04/19/2018   NA 138 05/25/2017   K 3.5 04/20/2018   K 3.7 04/19/2018   K 4.6 05/25/2017   CL 105 04/20/2018   CL 102 04/19/2018   CL 103 05/25/2017   CO2 25 04/20/2018   CO2 24 04/19/2018   CO2 21 05/25/2017   GLUCOSE 118 (H) 04/20/2018   GLUCOSE 141 (H) 04/19/2018   GLUCOSE 126 (H) 05/25/2017   BUN 28 (H) 04/20/2018   BUN 30 (H) 04/19/2018   BUN 26 (H) 05/25/2017   CREATININE 1.28 (H) 04/20/2018   CREATININE 1.48 (H) 04/19/2018   CREATININE 1.16 05/25/2017   CALCIUM 8.2 (L) 04/20/2018   CALCIUM 8.9 04/19/2018   CALCIUM 9.5 05/25/2017   LFT Recent Labs    04/19/18 1550  PROT 6.6  ALBUMIN 3.6  AST 17  ALT 22  ALKPHOS 67  BILITOT 0.6   PT/INR Lab Results  Component Value Date   INR 1.22 08/13/2015   Hepatitis Panel No results for input(s): HEPBSAG,  HCVAB, HEPAIGM, HEPBIGM in the last 72 hours. C-Diff No components found for: CDIFF Lipase     Component Value Date/Time   LIPASE 34 04/19/2018 1550    Drugs of Abuse  No results found for: LABOPIA, COCAINSCRNUR, LABBENZ, AMPHETMU, THCU, LABBARB   RADIOLOGY STUDIES: Ct Abdomen Pelvis W Contrast  Result Date: 04/19/2018 CLINICAL DATA:  Hemorrhoids without relief for 1 week. Abdominal distension. EXAM: CT ABDOMEN AND PELVIS WITH CONTRAST TECHNIQUE: Multidetector CT imaging of the abdomen and pelvis was performed using the standard protocol following bolus administration of intravenous contrast. CONTRAST:  55mL OMNIPAQUE IOHEXOL 300 MG/ML  SOLN COMPARISON:  None. FINDINGS: Lower chest: Mild atelectasis of posterior lung bases are noted. The heart size is normal. Hepatobiliary: 5 mm cyst is identified in the right lobe liver. The  liver is otherwise unremarkable. The gallbladder is normal. The biliary tree is normal. Pancreas: Unremarkable. No pancreatic ductal dilatation or surrounding inflammatory changes. Spleen: Normal in size without focal abnormality. Adrenals/Urinary Tract: The adrenal glands are normal. Simple cysts is identified in the midpole right kidney. There is a 2 mm nonobstructing stone in lower pole left kidney. No hydronephrosis is identified bilaterally. The bladder is normal. Stomach/Bowel: There is a small hiatal hernia. The stomach is otherwise normal. There is no small bowel obstruction. No colonic obstruction is identified. The appendix is not seen but no inflammation is noted around cecum. There is generalized edema and swelling in the distal rectal wall. Vascular/Lymphatic: Aortic atherosclerosis. No enlarged abdominal or pelvic lymph nodes. Reproductive: Uterus and bilateral adnexa are unremarkable. Other: None. Musculoskeletal: Degenerative joint changes of the spine are identified. Degenerative anterolisthesis of L5 on S1 is noted. IMPRESSION: Generalized edema and swelling in  the distal rectal wall, this can reflect internal hemorrhoids. Liver and right kidney cysts. 2 mm nonobstructing stone in lower pole left kidney. Electronically Signed   By: Abelardo Diesel M.D.   On: 04/19/2018 18:24    IMPRESSION:   *   Obstipation in patient who has chronic constipation issues which are suboptimally managed with lactulose at home. No previous colonoscopy and patient does not want to undergo a colonoscopy. The rectal findings on CT scan may well be the hemorrhoids or could be related to her chronic obstipation/constipation.  CT did not show ongoing colonic thickening in transverse/descending colon seen in 2017 CT.      *   Macrocytosis, minor anemia.      PLAN:     *    Will give her a bowel prep consisting of Gatorade/MiraLAX 64 ounces and 4 Dulcolax.  1 dose of Reglan at the start of the MiraLAX. Hopefully this will allow her to purge her bowels and not disrupt what sounds like very large internal hemorrhoids. Situation and plans were discussed with the patient and her son-in-law. She is agreeable to try this and reiterates her lack of desire to undergo colonoscopy.Marland Kitchen At present will not prescribe topical hemorrhoidal medications because she is just going to, hopefully, poop all this out.  Once we can clean her out, we can think about starting topical hemorrhoidal therapy    Going forward MiraLAX, Dulcolax or senna may be better choices for daily bowel regimen  Continue full liquids.  In the off chance that hypothyroidism may be a factor, ordered TSH level for the morning.  Also ordereing B12, folate levels.     Azucena Freed  04/20/2018, 12:05 PM Phone [phone]   Attending physician's note   I have taken a history, examined the patient and reviewed the chart. I agree with the Advanced Practitioner's note, impression and recommendations. 48 yr F with morbid obesity and multiple co-morbidities with worsening constipation and fecal impaction Dulcolax suppository  at bedtime Agree with bowel purge with Miralax and Gatorade Patient never had colonoscopy for colorectal cancer screening and she doesn't want to undergo it.  Rectal wall thickening possible secondary to hemorrhoids or proctitis/solitary rectal ulcer due to fecal impaction   Raliegh Ip Denzil Magnuson , MD [phone]

## 2018-04-20 NOTE — NC FL2 (Signed)
[place] MEDICAID FL2 LEVEL OF CARE SCREENING TOOL     IDENTIFICATION  Patient Name: Carol Page Birthdate: [birth year] Sex: female Admission Date (Current Location): 04/19/2018  [facility] [place] and [place] Number:  Herbalist and Address:  [facility], [place] [address] [place], [place] [address]      Provider Number: [id number]  Attending Physician Name and Address:  Lavina Hamman, MD  Relative Name and Phone Number:  [name], daughter, [phone]    Current Level of Care: Hospital Recommended Level of Care: [place] Prior Approval Number:    Date Approved/Denied:   PASRR Number: [id number]  Discharge Plan: SNF    Current Diagnoses: Patient Active Problem List   Diagnosis Date Noted  . Constipation 04/20/2018  . Proctitis 04/19/2018  . AKI (acute kidney injury) ([place]) 08/13/2015  . Anxiety and depression 10/06/2013  . Urinary incontinence 10/06/2013  . Chronic leg pain 10/06/2013  . History of CVA (cerebrovascular accident) 10/06/2013  . Essential hypertension 11/09/2012  . HLD (hyperlipidemia) 11/09/2012  . Severe obesity (BMI >= 40) ([place]) 11/09/2012  . Pre-diabetes 11/09/2012  . Seizure disorder ([place]) 11/09/2012    Orientation RESPIRATION BLADDER Height & Weight     Self, Time, Situation, Place  Normal Incontinent, External catheter Weight: 267 lb 3.2 oz (121.2 kg) Height:  5\' 2"  (157.5 cm)  BEHAVIORAL SYMPTOMS/MOOD NEUROLOGICAL BOWEL NUTRITION STATUS      Continent Diet(see discharge summary)  AMBULATORY STATUS COMMUNICATION OF NEEDS Skin   Extensive Assist Verbally Other (Comment)(MASD on perineum and groin)                       Personal Care Assistance Level of Assistance  Bathing, Feeding, Dressing Bathing Assistance: Maximum assistance Feeding assistance: Independent Dressing Assistance: Maximum assistance     Functional Limitations Info  Speech, Hearing, Sight Sight Info:  Adequate Hearing Info: Adequate Speech Info: Adequate    SPECIAL CARE FACTORS FREQUENCY  PT (By licensed PT), OT (By licensed OT)     PT Frequency: 5x week OT Frequency: 5x week            Contractures Contractures Info: Not present    Additional Factors Info  Allergies, Code Status, Psychotropic Code Status Info: DNR Allergies Info: CODEINE, ETHER  Psychotropic Info: amitriptyline (ELAVIL) tablet 25 mg daily at bedtime; LORazepam (ATIVAN) tablet 0.5 mg daily PO; traZODone (DESYREL) tablet 150 mg daily at bedtime; escitalopram (LEXAPRO) tablet 10 mg daily PO         Current Medications (04/20/2018):  This is the current hospital active medication list Current Facility-Administered Medications  Medication Dose Route Frequency Provider Last Rate Last Dose  . 0.9 %  sodium chloride infusion   Intravenous Continuous Johnson-Pitts, Endia, MD 75 mL/hr at 04/20/18 1423    . acetaminophen (TYLENOL) tablet 650 mg  650 mg Oral Q6H PRN Johnson-Pitts, Endia, MD       Or  . acetaminophen (TYLENOL) suppository 650 mg  650 mg Rectal Q6H PRN Johnson-Pitts, Endia, MD      . acetaminophen (TYLENOL) tablet 1,000 mg  1,000 mg Oral Q8H Johnson-Pitts, Endia, MD   1,000 mg at 04/20/18 1423  . amitriptyline (ELAVIL) tablet 25 mg  25 mg Oral QHS Johnson-Pitts, Endia, MD   25 mg at 04/20/18 0042  . atorvastatin (LIPITOR) tablet 80 mg  80 mg Oral Daily Johnson-Pitts, Endia, MD   80 mg at 04/20/18 1036  . bisacodyl (DULCOLAX) EC tablet 20  mg  20 mg Oral Daily PRN Vena Rua, PA-C      . diltiazem (CARDIZEM CD) 24 hr capsule 180 mg  180 mg Oral Daily Johnson-Pitts, Endia, MD   180 mg at 04/20/18 1036  . enoxaparin (LOVENOX) injection 40 mg  40 mg Subcutaneous Q24H Johnson-Pitts, Endia, MD      . escitalopram (LEXAPRO) tablet 10 mg  10 mg Oral Daily Johnson-Pitts, Endia, MD   10 mg at 04/20/18 1036  . gabapentin (NEURONTIN) capsule 200 mg  200 mg Oral BID Johnson-Pitts, Endia, MD   200 mg at 04/20/18  1035  . gabapentin (NEURONTIN) capsule 300 mg  300 mg Oral BH-q7a Johnson-Pitts, Endia, MD   300 mg at 04/20/18 0656  . hydrALAZINE (APRESOLINE) injection 10 mg  10 mg Intravenous Q8H PRN Johnson-Pitts, Endia, MD      . HYDROmorphone (DILAUDID) injection 0.5 mg  0.5 mg Intravenous Q4H PRN Johnson-Pitts, Endia, MD      . Derrill Memo ON 04/21/2018] lactulose (CHRONULAC) 10 GM/15ML solution 20 g  20 g Oral TID Gribbin, Sarah J, PA-C      . loratadine (CLARITIN) tablet 10 mg  10 mg Oral Daily Johnson-Pitts, Endia, MD   10 mg at 04/20/18 1036  . LORazepam (ATIVAN) tablet 0.5 mg  0.5 mg Oral Daily Johnson-Pitts, Endia, MD   0.5 mg at 04/20/18 1036  . nystatin (MYCOSTATIN/NYSTOP) topical powder 1 g  1 g Topical Daily PRN Johnson-Pitts, Endia, MD   1 g at 04/20/18 0824  . ondansetron (ZOFRAN) tablet 4 mg  4 mg Oral Q6H PRN Johnson-Pitts, Endia, MD       Or  . ondansetron (ZOFRAN) injection 4 mg  4 mg Intravenous Q6H PRN Johnson-Pitts, Endia, MD      . phenytoin (DILANTIN) chewable tablet 100 mg  100 mg Oral Daily Johnson-Pitts, Endia, MD   100 mg at 04/20/18 1036  . polyethylene glycol powder (GLYCOLAX/MIRALAX) container 255 g  1 Container Oral Once Vena Rua, PA-C      . traZODone (DESYREL) tablet 150 mg  150 mg Oral QHS Johnson-Pitts, Endia, MD   150 mg at 04/20/18 0042     Discharge Medications: Please see discharge summary for a list of discharge medications.  Relevant Imaging Results:  Relevant Lab Results:   Additional Information [id number] [place], [place]

## 2018-04-20 NOTE — Telephone Encounter (Signed)
Pt went to ED

## 2018-04-20 NOTE — Evaluation (Signed)
Physical Therapy Evaluation Patient Details Name: Carol Page MRN: [id number] DOB: 08/22/1935 Today's Date: 04/20/2018   History of Present Illness  Pt is an 82 y/o female admitted secondary to worsening abominal pain and constipation. Per notes secondary to proctitis vs inflamed hemmorhoid. PMH includes HTN, CVA, seizures, obesity, and pre-diabetes.   Clinical Impression  Pt admitted secondary to problem above with deficits below. Pt presenting with weakness, decreased balance, and LLE pain. Requiring mod A to perform stand pivot transfer using RW. Feel pt is at increased risk for falls and would benefit from SNF level therapy at d/c. Will continue to follow acutely to maximize functional mobility independence and safety.     Follow Up Recommendations SNF;Supervision/Assistance - 24 hour    Equipment Recommendations  None recommended by PT    Recommendations for Other Services OT consult     Precautions / Restrictions Precautions Precautions: Fall Restrictions Weight Bearing Restrictions: No      Mobility  Bed Mobility Overal bed mobility: Needs Assistance Bed Mobility: Supine to Sit     Supine to sit: Mod assist     General bed mobility comments: Mod A for assist with LEs and scooting hips to EOB. Heavy use of bed rails for trunk elevation.   Transfers Overall transfer level: Needs assistance Equipment used: Rolling walker (2 wheeled) Transfers: Sit to/from [facility] Sit to Stand: Mod assist Stand pivot transfers: Mod assist       General transfer comment: Mod A for lift assist and steadying. Pt with episode of incontinence upon standing initially so required seated rest for clean up. Performed standing X2. Mod A for steadying assist to transfer to chair using RW. Verbal cues for sequencing.   Ambulation/Gait             General Gait Details: Deferred secondary to pain.   Stairs            Wheelchair Mobility    Modified Rankin  (Stroke Patients Only)       Balance Overall balance assessment: Needs assistance Sitting-balance support: No upper extremity supported;Feet supported Sitting balance-Leahy Scale: Fair     Standing balance support: Bilateral upper extremity supported;During functional activity Standing balance-Leahy Scale: Poor Standing balance comment: Reliant on BUE support.                              Pertinent Vitals/Pain Pain Assessment: Faces Faces Pain Scale: Hurts even more Pain Location: LLE  Pain Descriptors / Indicators: Aching;Grimacing;Guarding Pain Intervention(s): Limited activity within patient's tolerance;Monitored during session;Repositioned    Home Living Family/patient expects to be discharged to:: Private residence Living Arrangements: Children Available Help at Discharge: Family;Available 24 hours/day Type of Home: House Home Access: Stairs to enter Entrance Stairs-Rails: None Entrance Stairs-Number of Steps: 2 Home Layout: One level Home Equipment: Bedside commode;Tub bench;Walker - 4 wheels      Prior Function Level of Independence: Needs assistance   Gait / Transfers Assistance Needed: Reports she uses RW for ambulation for short distances. Reports she needs assist for stair management.   ADL's / Homemaking Assistance Needed: Reports she needs assist for tub transfers and bathing.         Hand Dominance        Extremity/Trunk Assessment   Upper Extremity Assessment Upper Extremity Assessment: Defer to OT evaluation    Lower Extremity Assessment Lower Extremity Assessment: LLE deficits/detail;Generalized weakness LLE Deficits / Details: Limited ROM secondary to pain.  Cervical / Trunk Assessment Cervical / Trunk Assessment: Kyphotic  Communication   Communication: No difficulties  Cognition Arousal/Alertness: Awake/alert Behavior During Therapy: WFL for tasks assessed/performed Overall Cognitive Status: Within Functional Limits  for tasks assessed                                        General Comments General comments (skin integrity, edema, etc.): No family present.     Exercises     Assessment/Plan    PT Assessment Patient needs continued PT services  PT Problem List Decreased strength;Decreased balance;Decreased activity tolerance;Decreased mobility;Decreased knowledge of use of DME;Decreased knowledge of precautions;Pain       PT Treatment Interventions DME instruction;Gait training;Stair training;Functional mobility training;Therapeutic activities;Balance training;Therapeutic exercise;Patient/family education    PT Goals (Current goals can be found in the Care Plan section)  Acute Rehab PT Goals Patient Stated Goal: to feel better  PT Goal Formulation: With patient Time For Goal Achievement: 05/04/18 Potential to Achieve Goals: Good    Frequency Min 2X/week   Barriers to discharge        Co-evaluation               AM-PAC PT "6 Clicks" Daily Activity  Outcome Measure Difficulty turning over in bed (including adjusting bedclothes, sheets and blankets)?: A Lot Difficulty moving from lying on back to sitting on the side of the bed? : Unable Difficulty sitting down on and standing up from a chair with arms (e.g., wheelchair, bedside commode, etc,.)?: Unable Help needed moving to and from a bed to chair (including a wheelchair)?: A Lot Help needed walking in hospital room?: A Lot Help needed climbing 3-5 steps with a railing? : Total 6 Click Score: 9    End of Session Equipment Utilized During Treatment: Gait belt Activity Tolerance: Patient limited by pain Patient left: in chair;with call bell/phone within reach;with chair alarm set Nurse Communication: Mobility status PT Visit Diagnosis: Other abnormalities of gait and mobility (R26.89);Unsteadiness on feet (R26.81);Muscle weakness (generalized) (M62.81);Pain Pain - Right/Left: Left Pain - part of body: Leg     Time: 1353-1416 PT Time Calculation (min) (ACUTE ONLY): 23 min   Charges:   PT Evaluation $PT Eval Moderate Complexity: 1 Mod PT Treatments $Therapeutic Activity: 8-22 mins        Leighton Ruff, PT, DPT  Acute Rehabilitation Services  Pager: [phone] Office: [phone]   Rudean Hitt 04/20/2018, 2:23 PM

## 2018-04-21 ENCOUNTER — Inpatient Hospital Stay: Payer: Medicare Other | Admitting: Internal Medicine

## 2018-04-21 ENCOUNTER — Inpatient Hospital Stay (HOSPITAL_COMMUNITY): Payer: Medicare Other

## 2018-04-21 DIAGNOSIS — N179 Acute kidney failure, unspecified: Secondary | ICD-10-CM

## 2018-04-21 DIAGNOSIS — F329 Major depressive disorder, single episode, unspecified: Secondary | ICD-10-CM

## 2018-04-21 DIAGNOSIS — K5900 Constipation, unspecified: Secondary | ICD-10-CM

## 2018-04-21 DIAGNOSIS — G40909 Epilepsy, unspecified, not intractable, without status epilepticus: Secondary | ICD-10-CM

## 2018-04-21 DIAGNOSIS — F419 Anxiety disorder, unspecified: Secondary | ICD-10-CM

## 2018-04-21 LAB — TSH: TSH: 3.732 u[IU]/mL (ref 0.350–4.500)

## 2018-04-21 LAB — BASIC METABOLIC PANEL
Anion gap: 7 (ref 5–15)
BUN: 24 mg/dL — ABNORMAL HIGH (ref 8–23)
CALCIUM: 8.2 mg/dL — AB (ref 8.9–10.3)
CO2: 25 mmol/L (ref 22–32)
CREATININE: 1.3 mg/dL — AB (ref 0.44–1.00)
Chloride: 105 mmol/L (ref 98–111)
GFR calc Af Amer: 44 mL/min — ABNORMAL LOW (ref >60)
GFR calc non Af Amer: 38 mL/min — ABNORMAL LOW (ref >60)
GLUCOSE: 110 mg/dL — AB (ref 70–99)
Potassium: 3.7 mmol/L (ref 3.5–5.1)
Sodium: 137 mmol/L (ref 135–145)

## 2018-04-21 LAB — VITAMIN B12: Vitamin B-12: 233 pg/mL (ref 180–914)

## 2018-04-21 MED ORDER — INFLUENZA VAC SPLIT HIGH-DOSE 0.5 ML IM SUSY: 0.5000 mL | PREFILLED_SYRINGE | INTRAMUSCULAR | Status: DC | PRN

## 2018-04-21 MED ORDER — CLOPIDOGREL BISULFATE 75 MG PO TABS
75.0000 mg | ORAL_TABLET | Freq: Every day | ORAL | Status: DC
  Administered 2018-04-21 – 2018-04-26 (×6): 75 mg via ORAL
  Filled 2018-04-21 (×6): qty 1

## 2018-04-21 MED ORDER — POLYETHYLENE GLYCOL 3350 17 G PO PACK
17.0000 g | PACK | Freq: Two times a day (BID) | ORAL | Status: DC
  Administered 2018-04-22: 17 g via ORAL
  Filled 2018-04-21: qty 1

## 2018-04-21 NOTE — Progress Notes (Signed)
Daily Rounding Note  04/21/2018, 1:17 PM  LOS: 1 day   SUBJECTIVE:   Chief complaint: Constipation.  Abdominal pain. Several episodes of passing small volumes of formed stool.  Drank only 32 ounces of the 64 ounces of Gatorade/MiraLAX.  No further abdominal pain.  No nausea or vomiting.  She is hungry.  OBJECTIVE:         Vital signs in last 24 hours:    Temp:  [98.2 F (36.8 C)-98.9 F (37.2 C)] 98.4 F (36.9 C) (10/03 0504) Pulse Rate:  [79-83] 83 (10/03 0504) Resp:  [18] 18 (10/03 0504) BP: (146-156)/(51-66) 154/66 (10/03 0504) SpO2:  [91 %-96 %] 91 % (10/03 0504) Last BM Date: 04/20/18 Filed Weights   04/20/18 0045  Weight: 121.2 kg   General: Obese, does not look ill. Heart: RRR. Chest: Clear bilaterally.  No labored breathing. Abdomen: Soft.  Active bowel sounds.  Not tender, obese but not distended. Extremities: Lower extremity edema. Neuro/Psych: Oriented x3.  Cooperative.  No gross deficits.  Intake/Output from previous day: 10/02 0701 - 10/03 0700 In: 2888.6 [P.O.:1560; I.V.:1128.6] Out: -   Intake/Output this shift: Total I/O In: -  Out: 1 [Stool:1]  Lab Results: Recent Labs    04/19/18 1550 04/20/18 0545  WBC 11.0* 10.9*  HGB 11.6* 10.6*  HCT 35.6* 32.4*  PLT 215 207   BMET Recent Labs    04/19/18 1550 04/20/18 0545 04/21/18 0436  NA 136 139 137  K 3.7 3.5 3.7  CL 102 105 105  CO2 24 25 25   GLUCOSE 141* 118* 110*  BUN 30* 28* 24*  CREATININE 1.48* 1.28* 1.30*  CALCIUM 8.9 8.2* 8.2*   LFT Recent Labs    04/19/18 1550  PROT 6.6  ALBUMIN 3.6  AST 17  ALT 22  ALKPHOS 67  BILITOT 0.6   PT/INR No results for input(s): LABPROT, INR in the last 72 hours. Hepatitis Panel No results for input(s): HEPBSAG, HCVAB, HEPAIGM, HEPBIGM in the last 72 hours.  Studies/Results: Ct Head Wo Contrast  Result Date: 04/21/2018 CLINICAL DATA:  Difficulty with ambulation. Upper and  lower extremity weakness EXAM: CT HEAD WITHOUT CONTRAST TECHNIQUE: Contiguous axial images were obtained from the base of the skull through the vertex without intravenous contrast. COMPARISON:  August 13, 2015 FINDINGS: Brain: Mild diffuse atrophy is stable. There is no intracranial mass, hemorrhage, extra-axial fluid collection, or midline shift. There is small vessel disease in the centra semiovale bilaterally, somewhat more on the left anteriorly than elsewhere, stable. Elsewhere, brain parenchyma appears unremarkable and stable. There is no evident acute infarct. Vascular: No hyperdense vessels. There is calcification in each carotid siphon region. Skull: The bony calvarium appears intact. Sinuses/Orbits: There is opacification in the inferior right maxillary antrum. There is opacification in multiple ethmoid air cells bilaterally. There is leftward deviation of the nasal septum. Orbits appear symmetric bilaterally. Other: There is opacification in several inferior mastoid air cells, stable. Mastoids elsewhere clear. IMPRESSION: Stable atrophy with periventricular small vessel disease. No acute infarct is appreciable. No mass or hemorrhage. There are foci of arterial vascular calcification. There are foci of paranasal sinus disease and mastoid disease. There is leftward deviation of the nasal septum. Electronically Signed   By: Lowella Grip III M.D.   On: 04/21/2018 11:44   Ct Abdomen Pelvis W Contrast  Result Date: 04/19/2018 CLINICAL DATA:  Hemorrhoids without relief for 1 week. Abdominal distension. EXAM: CT ABDOMEN AND PELVIS WITH CONTRAST  TECHNIQUE: Multidetector CT imaging of the abdomen and pelvis was performed using the standard protocol following bolus administration of intravenous contrast. CONTRAST:  38mL OMNIPAQUE IOHEXOL 300 MG/ML  SOLN COMPARISON:  None. FINDINGS: Lower chest: Mild atelectasis of posterior lung bases are noted. The heart size is normal. Hepatobiliary: 5 mm cyst is  identified in the right lobe liver. The liver is otherwise unremarkable. The gallbladder is normal. The biliary tree is normal. Pancreas: Unremarkable. No pancreatic ductal dilatation or surrounding inflammatory changes. Spleen: Normal in size without focal abnormality. Adrenals/Urinary Tract: The adrenal glands are normal. Simple cysts is identified in the midpole right kidney. There is a 2 mm nonobstructing stone in lower pole left kidney. No hydronephrosis is identified bilaterally. The bladder is normal. Stomach/Bowel: There is a small hiatal hernia. The stomach is otherwise normal. There is no small bowel obstruction. No colonic obstruction is identified. The appendix is not seen but no inflammation is noted around cecum. There is generalized edema and swelling in the distal rectal wall. Vascular/Lymphatic: Aortic atherosclerosis. No enlarged abdominal or pelvic lymph nodes. Reproductive: Uterus and bilateral adnexa are unremarkable. Other: None. Musculoskeletal: Degenerative joint changes of the spine are identified. Degenerative anterolisthesis of L5 on S1 is noted. IMPRESSION: Generalized edema and swelling in the distal rectal wall, this can reflect internal hemorrhoids. Liver and right kidney cysts. 2 mm nonobstructing stone in lower pole left kidney. Electronically Signed   By: Abelardo Diesel M.D.   On: 04/19/2018 18:24    ASSESMENT:   *   Obstipation and patient with chronic constipation.  Abdominal pain associated with constipation. Abdominal pain now resolved after getting her bowels to move.  However suspect she still has a significant burden of stool and have asked her to complete drinking the final 32 ounces of Gatorade/MiraLAX laxative. Patient does not want to undergo colonoscopy.  *    Echo cytosis, minor anemia  *    AKI, improved.   PLAN   *    Finished up the rest of the MiraLAX/Gatorade.  Beginning tomorrow going forward once she discharges home she should switch off of  lactulose and onto MiraLAX 17 g twice daily.  This can be titrated up or down in order to achieve a goal of ideally daily bowel movements. GI signing off.    Azucena Freed  04/21/2018, 1:17 PM Phone [phone]

## 2018-04-21 NOTE — Evaluation (Signed)
Occupational Therapy Evaluation Patient Details Name: Carol Page MRN: [id number] DOB: 08-15-1935 Today's Date: 04/21/2018    History of Present Illness Pt is an 82 y/o female admitted secondary to worsening abominal pain and constipation. Per notes secondary to proctitis vs inflamed hemmorhoid. PMH includes HTN, CVA, seizures, obesity, and pre-diabetes.    Clinical Impression   Pt admitted with the above diagnoses and presents with below problem list. Pt will benefit from continued acute OT to address the below listed deficits and maximize independence with basic ADLs prior to d/c to venue below. PTA pt was needing assist for tub transfers and bathing, rw for mobility/transfers. Pt is currently mod-max A for UB/LB ADLs and toilet transfers (SPT to [facility]), Pt needing +2 for return to bed. Of note, pt reporting numbness/tingling in left hand and left side of face that began earlier this morning ("before breakfast") and has improved somewhat  but not completely resolved. Nursing and MD notified. Per MD pt is now for CT scan today.     Follow Up Recommendations  SNF    Equipment Recommendations  Other (comment)(defer to next venue)    Recommendations for Other Services       Precautions / Restrictions Precautions Precautions: Fall Restrictions Weight Bearing Restrictions: No      Mobility Bed Mobility Overal bed mobility: Needs Assistance Bed Mobility: Sit to Supine       Sit to supine: Max assist;+2 for safety/equipment   General bed mobility comments: Assist to advance BLE and pivot hips. +2 max physical assist to scoot up in bed.   Transfers Overall transfer level: Needs assistance Equipment used: Rolling walker (2 wheeled) Transfers: Sit to/from [facility] Sit to Stand: Mod assist Stand pivot transfers: Mod assist       General transfer comment: Mod A from EOB for powering up and steadying.     Balance Overall balance assessment: Needs  assistance Sitting-balance support: No upper extremity supported;Feet supported Sitting balance-Leahy Scale: Fair     Standing balance support: Bilateral upper extremity supported;During functional activity Standing balance-Leahy Scale: Poor Standing balance comment: Reliant on BUE support.                            ADL either performed or assessed with clinical judgement   ADL Overall ADL's : Needs assistance/impaired Eating/Feeding: Set up;Sitting   Grooming: Set up;Sitting   Upper Body Bathing: Moderate assistance;Sitting   Lower Body Bathing: Maximal assistance;Sit to/from stand   Upper Body Dressing : Moderate assistance;Sitting   Lower Body Dressing: Maximal assistance;Sit to/from stand   Toilet Transfer: Maximal assistance;Stand-pivot;BSC;RW   Toileting- Clothing Manipulation and Hygiene: Maximal assistance;Sit to/from stand         General ADL Comments: Pt received EOB after BSC transfer with nurse. Pt completd sit<> 1x for pericare with pt standing with support of rw and therapist completing pericare. Pt then completed bed mobility.      Vision         Perception     Praxis      Pertinent Vitals/Pain Pain Assessment: Faces Faces Pain Scale: Hurts even more Pain Descriptors / Indicators: Aching;Grimacing;Guarding Pain Intervention(s): Limited activity within patient's tolerance;Monitored during session     Hand Dominance Right   Extremity/Trunk Assessment Upper Extremity Assessment Upper Extremity Assessment: Generalized weakness   Lower Extremity Assessment Lower Extremity Assessment: Defer to PT evaluation   Cervical / Trunk Assessment Cervical / Trunk Assessment: Kyphotic   Communication  Communication Communication: No difficulties   Cognition Arousal/Alertness: Awake/alert Behavior During Therapy: WFL for tasks assessed/performed(discouraged by situation, tearful at times) Overall Cognitive Status: Within Functional Limits for  tasks assessed                                     General Comments       Exercises     Shoulder Instructions      Home Living Family/patient expects to be discharged to:: Private residence Living Arrangements: Children Available Help at Discharge: Family;Available 24 hours/day Type of Home: House Home Access: Stairs to enter [employer] of Steps: 2 Entrance Stairs-Rails: None Home Layout: One level     Bathroom Shower/Tub: Tub/shower unit         Home Equipment: Bedside commode;Tub bench;Walker - 4 wheels          Prior Functioning/Environment Level of Independence: Needs assistance  Gait / Transfers Assistance Needed: Reports she uses RW for ambulation for short distances. Reports she needs assist for stair management.  ADL's / Homemaking Assistance Needed: Reports she needs assist for tub transfers and bathing.             OT Problem List: Decreased strength;Decreased activity tolerance;Impaired balance (sitting and/or standing);Decreased knowledge of use of DME or AE;Decreased knowledge of precautions;Obesity;Pain      OT Treatment/Interventions: Self-care/ADL training;DME and/or AE instruction;Energy conservation;Therapeutic activities;Patient/family education;Balance training    OT Goals(Current goals can be found in the care plan section) Acute Rehab OT Goals Patient Stated Goal: to feel better  OT Goal Formulation: With patient Time For Goal Achievement: 05/05/18 Potential to Achieve Goals: Good ADL Goals Pt Will Perform Lower Body Bathing: sit to/from stand;with min assist Pt Will Perform Lower Body Dressing: with min assist;sit to/from stand Pt Will Transfer to Toilet: with min assist;ambulating Pt Will Perform Toileting - Clothing Manipulation and hygiene: with min assist;sit to/from stand Additional ADL Goal #1: Pt will complete bed mobility at min A level to prepare for OOB ADLs.  OT Frequency: Min 2X/week   Barriers  to D/C:            Co-evaluation              AM-PAC PT "6 Clicks" Daily Activity     Outcome Measure Help from another person eating meals?: None Help from another person taking care of personal grooming?: A Little Help from another person toileting, which includes using toliet, bedpan, or urinal?: A Lot Help from another person bathing (including washing, rinsing, drying)?: A Lot Help from another person to put on and taking off regular upper body clothing?: A Little Help from another person to put on and taking off regular lower body clothing?: A Lot 6 Click Score: 16   End of Session Nurse Communication: Other (comment)(new numbness and tingling in left hand and left face.)  Activity Tolerance: Patient limited by fatigue Patient left: in bed;with call bell/phone within reach;with bed alarm set  OT Visit Diagnosis: Unsteadiness on feet (R26.81);Pain;Muscle weakness (generalized) (M62.81)                Time: 2620-3559 OT Time Calculation (min): 11 min Charges:  OT General Charges $OT Visit: 1 Visit OT Evaluation $OT Eval Low Complexity: [place], OT Acute Rehabilitation Services Pager: [phone] Office: [phone]   Hortencia Pilar 04/21/2018, 10:39 AM

## 2018-04-21 NOTE — Clinical Social Work Note (Signed)
Clinical Social Work Assessment  Patient Details  Name: Carol Page MRN: [id number] Date of Birth: [birth year]  Date of referral:  04/21/18               Reason for consult:  Facility Placement, Discharge Planning                Permission sought to share information with:  Facility Sport and exercise psychologist, Family Supports Permission granted to share information::  No  Name::      Corporate investment banker  Agency::   SNFs- preference for [facility]  Relationship::   daughter  Contact Information:   [phone]  Housing/Transportation Living arrangements for the past 2 months:  Single Family Home Source of Information:  Adult Children Patient Interpreter Needed:  None Criminal Activity/Legal Involvement Pertinent to Current Situation/Hospitalization:  No - Comment as needed Significant Relationships:  Adult Children Lives with:  Adult Children Do you feel safe going back to the place where you live?  Yes Need for family participation in patient care:  Yes (Comment)  Care giving concerns:  Pt from home with her daughter who has been caregiving, pt requiring assistance with IADLs and ADLs, SNF level assistance recommended.    Social Worker assessment / plan:  CSW spoke with pt daughter. Pt has been living with her since a significant fall while living in [place]. Pt daughter states that pt has been requiring more assistance than normal- pt has been to [facility] in the past and requests referral sent there. Pt daughter would like follow up just in case pt needs to select another facility. Pt daughter familiar with SNF referral process. Pt will need to maintain two more medically necessary nights of care in order to qualify for SNF level care.   Employment status:  Retired Forensic scientist:  [employer] PT Recommendations:  [place], [place] / Referral to community resources:  [place]  Patient/Family's Response to  care:  Pt daughter amenable to referral to [employer], pt daughter amenable to speaking with CSW and understanding of recommendations.   Patient/Family's Understanding of and Emotional Response to Diagnosis, Current Treatment, and Prognosis:  Pt daughter states understanding of diagnosis, current treatment and prognosis. Pt daughter is main caregiver and knowledgeable about pt needs. Pt daughter was emotionally appropriate via telephone and expresses appropriate goals for pt at discharge.   Emotional Assessment Appearance:  Appears stated age Attitude/Demeanor/Rapport:  Unable to Assess Affect (typically observed):  Unable to Assess Orientation:  Oriented to Place, Oriented to Self, Fluctuating Orientation (Suspected and/or reported Sundowners) Alcohol / Substance use:  Not Applicable Psych involvement (Current and /or in the community):  No (Comment)  Discharge Needs  Concerns to be addressed:  Care Coordination Readmission within the last 30 days:  No Current discharge risk:  Cognitively Impaired, Dependent with Mobility, Physical Impairment Barriers to Discharge:  Ship broker, Continued Medical Work up   [employer], [place] 04/21/2018, 11:50 AM

## 2018-04-21 NOTE — Progress Notes (Signed)
PROGRESS NOTE    [name]  GLO:[id number] DOB: 03-17-1936 DOA: 04/19/2018 PCP: Jearld Fenton, NP  Brief Narrative: Carol Page a 82 y.o.femalewith medical history significant ofchronic constipation, pain, hypertension morbid obesity, and seizure disorder presented to ED on 10/1 with abdominal pain. Patient lives at home with her daughter and son-in-law [name]. Patient has ongoing constipation. She takes lactulose at baseline. Over the previous week she had exacerbation of her symptoms and her last bowel movement was September 26. They  tried increasing her lactulose as well as numerous fleets enema over previous few days without relief. -Due to worsening symptoms presented to the emergency room, noted to have severe obstipation and proctitis   Assessment & Plan:   Obstipation in the background of chronic constipation -Now with abdominal pain and nausea -Finally improving, started having bowel movements after Gatorade/MiraLAX -Appreciate gastroenterology consult -Patient declined colonoscopy -Liquid diet -Transition from lactulose to MiraLAX twice daily and stool softener at discharge -Physical therapy evaluation  Mild left face and left upper extremity numbness -Was noted early this morning when she woke up around 5 AM, did not mention this to anybody until OT saw her at 10 AM -Prior history of stroke with left-sided weakness and numbness which had resolved -Restart Plavix, unclear why she had stopped this I called patient's daughter who also was not able to give me clear information, continue statin -Stat CT head  AKI (acute kidney injury) ([place])  -continue to hold ace inhibitor -Creatinine stable at 1.3 which may be her baseline  Essential hypertension -Home regimen of ACE inhibitor and HCTZ on hold  Severe obesity (BMI >= 40) ([place]): BMI 48.6. Nutritional consult  Seizure disorder ([place]) -continue regimen of phenytoin and gabapentin  Anxiety and  depression:  -stable -continue SSRI, Elavil, trazodone  Code Status: dnr Family Communication: none present Disposition Plan: home when ready   Consultants:   Neurology   Procedures:   Antimicrobials:    Subjective: -Had multiple bowel movements last night and a couple this morning -Patient woke up with mild left facial and left arm numbness which has since improved  Objective: Vitals:   04/20/18 0550 04/20/18 1639 04/20/18 2112 04/21/18 0504  BP: (!) 147/58 (!) 146/51 (!) 156/51 (!) 154/66  Pulse: 75 80 79 83  Resp: 18 18 18 18   Temp: 97.8 F (36.6 C) 98.9 F (37.2 C) 98.2 F (36.8 C) 98.4 F (36.9 C)  TempSrc: Oral Oral Oral Oral  SpO2: 94% 93% 96% 91%  Weight:      Height:        Intake/Output Summary (Last 24 hours) at 04/21/2018 1340 Last data filed at 04/21/2018 0708 Gross per 24 hour  Intake 2408.62 ml  Output 1 ml  Net 2407.62 ml   Filed Weights   04/20/18 0045  Weight: 121.2 kg    Examination:  General exam: Morbidly obese anxious female, laying in bed, no distress Respiratory system: Clear to auscultation. Respiratory effort normal. Cardiovascular system: S1 & S2 heard, RRR. No JVD, murmurs, rubs, gallops Gastrointestinal system: Abdomen is obese, mildly distended, mild diffuse tenderness, bowel sounds present  Central nervous system: Alert and oriented. No focal neurological deficits. Extremities: No edema Skin: No rashes, lesions or ulcers Psychiatry: Judgement and insight appear normal. Mood & affect appropriate.     Data Reviewed:   CBC: Recent Labs  Lab 04/19/18 1550 04/20/18 0545  WBC 11.0* 10.9*  HGB 11.6* 10.6*  HCT 35.6* 32.4*  MCV 102.3* 101.9*  PLT 215 207  Basic Metabolic Panel: Recent Labs  Lab 04/19/18 1550 04/20/18 0545 04/21/18 0436  NA 136 139 137  K 3.7 3.5 3.7  CL 102 105 105  CO2 24 25 25   GLUCOSE 141* 118* 110*  BUN 30* 28* 24*  CREATININE 1.48* 1.28* 1.30*  CALCIUM 8.9 8.2* 8.2*    GFR: Estimated Creatinine Clearance: 42.8 mL/min (A) (by C-G formula based on SCr of 1.3 mg/dL (H)). Liver Function Tests: Recent Labs  Lab 04/19/18 1550  AST 17  ALT 22  ALKPHOS 67  BILITOT 0.6  PROT 6.6  ALBUMIN 3.6   Recent Labs  Lab 04/19/18 1550  LIPASE 34   No results for input(s): AMMONIA in the last 168 hours. Coagulation Profile: No results for input(s): INR, PROTIME in the last 168 hours. Cardiac Enzymes: No results for input(s): CKTOTAL, CKMB, CKMBINDEX, TROPONINI in the last 168 hours. BNP (last 3 results) No results for input(s): PROBNP in the last 8760 hours. HbA1C: No results for input(s): HGBA1C in the last 72 hours. CBG: No results for input(s): GLUCAP in the last 168 hours. Lipid Profile: No results for input(s): CHOL, HDL, LDLCALC, TRIG, CHOLHDL, LDLDIRECT in the last 72 hours. Thyroid Function Tests: Recent Labs    04/21/18 0436  TSH 3.732   Anemia Panel: Recent Labs    04/21/18 0436  VITAMINB12 233   Urine analysis:    Component Value Date/Time   COLORURINE AMBER (A) 08/13/2015 1107   APPEARANCEUR CLEAR 08/13/2015 1107   LABSPEC 1.024 08/13/2015 1107   PHURINE 5.0 08/13/2015 1107   GLUCOSEU NEGATIVE 08/13/2015 1107   GLUCOSEU NEGATIVE 11/09/2012 1537   HGBUR NEGATIVE 08/13/2015 1107   BILIRUBINUR LARGE (A) 08/13/2015 1107   KETONESUR 15 (A) 08/13/2015 1107   PROTEINUR NEGATIVE 08/13/2015 1107   UROBILINOGEN 1.0 05/04/2015 1505   NITRITE POSITIVE (A) 08/13/2015 1107   LEUKOCYTESUR SMALL (A) 08/13/2015 1107   Sepsis Labs: @LABRCNTIP (procalcitonin:4,lacticidven:4)  )No results found for this or any previous visit (from the past 240 hour(s)).       Radiology Studies: Ct Head Wo Contrast  Result Date: 04/21/2018 CLINICAL DATA:  Difficulty with ambulation. Upper and lower extremity weakness EXAM: CT HEAD WITHOUT CONTRAST TECHNIQUE: Contiguous axial images were obtained from the base of the skull through the vertex without  intravenous contrast. COMPARISON:  August 13, 2015 FINDINGS: Brain: Mild diffuse atrophy is stable. There is no intracranial mass, hemorrhage, extra-axial fluid collection, or midline shift. There is small vessel disease in the centra semiovale bilaterally, somewhat more on the left anteriorly than elsewhere, stable. Elsewhere, brain parenchyma appears unremarkable and stable. There is no evident acute infarct. Vascular: No hyperdense vessels. There is calcification in each carotid siphon region. Skull: The bony calvarium appears intact. Sinuses/Orbits: There is opacification in the inferior right maxillary antrum. There is opacification in multiple ethmoid air cells bilaterally. There is leftward deviation of the nasal septum. Orbits appear symmetric bilaterally. Other: There is opacification in several inferior mastoid air cells, stable. Mastoids elsewhere clear. IMPRESSION: Stable atrophy with periventricular small vessel disease. No acute infarct is appreciable. No mass or hemorrhage. There are foci of arterial vascular calcification. There are foci of paranasal sinus disease and mastoid disease. There is leftward deviation of the nasal septum. Electronically Signed   By: Lowella Grip III M.D.   On: 04/21/2018 11:44   Ct Abdomen Pelvis W Contrast  Result Date: 04/19/2018 CLINICAL DATA:  Hemorrhoids without relief for 1 week. Abdominal distension. EXAM: CT ABDOMEN AND PELVIS WITH CONTRAST  TECHNIQUE: Multidetector CT imaging of the abdomen and pelvis was performed using the standard protocol following bolus administration of intravenous contrast. CONTRAST:  56mL OMNIPAQUE IOHEXOL 300 MG/ML  SOLN COMPARISON:  None. FINDINGS: Lower chest: Mild atelectasis of posterior lung bases are noted. The heart size is normal. Hepatobiliary: 5 mm cyst is identified in the right lobe liver. The liver is otherwise unremarkable. The gallbladder is normal. The biliary tree is normal. Pancreas: Unremarkable. No pancreatic  ductal dilatation or surrounding inflammatory changes. Spleen: Normal in size without focal abnormality. Adrenals/Urinary Tract: The adrenal glands are normal. Simple cysts is identified in the midpole right kidney. There is a 2 mm nonobstructing stone in lower pole left kidney. No hydronephrosis is identified bilaterally. The bladder is normal. Stomach/Bowel: There is a small hiatal hernia. The stomach is otherwise normal. There is no small bowel obstruction. No colonic obstruction is identified. The appendix is not seen but no inflammation is noted around cecum. There is generalized edema and swelling in the distal rectal wall. Vascular/Lymphatic: Aortic atherosclerosis. No enlarged abdominal or pelvic lymph nodes. Reproductive: Uterus and bilateral adnexa are unremarkable. Other: None. Musculoskeletal: Degenerative joint changes of the spine are identified. Degenerative anterolisthesis of L5 on S1 is noted. IMPRESSION: Generalized edema and swelling in the distal rectal wall, this can reflect internal hemorrhoids. Liver and right kidney cysts. 2 mm nonobstructing stone in lower pole left kidney. Electronically Signed   By: Abelardo Diesel M.D.   On: 04/19/2018 18:24        Scheduled Meds: . acetaminophen  1,000 mg Oral Q8H  . amitriptyline  25 mg Oral QHS  . atorvastatin  80 mg Oral Daily  . clopidogrel  75 mg Oral Daily  . diltiazem  180 mg Oral Daily  . enoxaparin (LOVENOX) injection  40 mg Subcutaneous Q24H  . escitalopram  10 mg Oral Daily  . gabapentin  200 mg Oral BID  . gabapentin  300 mg Oral BH-q7a  . lactulose  20 g Oral TID  . loratadine  10 mg Oral Daily  . LORazepam  0.5 mg Oral Daily  . phenytoin  100 mg Oral Daily  . [START ON 04/22/2018] polyethylene glycol  17 g Oral BID  . traZODone  150 mg Oral QHS   Continuous Infusions:   LOS: 1 day    Time spent: 72min    Domenic Polite, MD [facility] Page via [email or web address], password [email or web address] After 7PM please contact  night-coverage  04/21/2018, 1:40 PM

## 2018-04-22 ENCOUNTER — Inpatient Hospital Stay (HOSPITAL_COMMUNITY): Payer: Medicare Other

## 2018-04-22 DIAGNOSIS — G459 Transient cerebral ischemic attack, unspecified: Secondary | ICD-10-CM

## 2018-04-22 MED ORDER — HYDROCORTISONE 2.5 % RE CREA
TOPICAL_CREAM | Freq: Three times a day (TID) | RECTAL | Status: DC
  Administered 2018-04-22 – 2018-04-25 (×8): via RECTAL
  Filled 2018-04-22: qty 28.35

## 2018-04-22 MED ORDER — HYDROCHLOROTHIAZIDE 25 MG PO TABS
25.0000 mg | ORAL_TABLET | Freq: Every day | ORAL | Status: DC
  Administered 2018-04-22 – 2018-04-26 (×5): 25 mg via ORAL
  Filled 2018-04-22 (×5): qty 1

## 2018-04-22 MED ORDER — LACTULOSE 10 GM/15ML PO SOLN: 20.0000 g | Freq: Every day | ORAL | Status: DC | PRN

## 2018-04-22 MED ORDER — POLYETHYLENE GLYCOL 3350 17 G PO PACK
17.0000 g | PACK | Freq: Every day | ORAL | Status: DC
  Administered 2018-04-23 – 2018-04-24 (×2): 17 g via ORAL
  Filled 2018-04-22 (×3): qty 1

## 2018-04-22 MED ORDER — POTASSIUM CHLORIDE CRYS ER 20 MEQ PO TBCR
40.0000 meq | EXTENDED_RELEASE_TABLET | Freq: Every day | ORAL | Status: DC
  Administered 2018-04-22 – 2018-04-24 (×3): 40 meq via ORAL
  Filled 2018-04-22 (×4): qty 2

## 2018-04-22 NOTE — Progress Notes (Signed)
*  Preliminary Results* Carotid artery duplex has been completed.  Right ICA  is consistent with 1-39% stenosis.  Left ICA is consistent with 40-59% stenosis. Bilateral vertebral arteries are antegrade.    04/22/2018 2:25 PM  Abram Sander

## 2018-04-22 NOTE — Consult Note (Signed)
            [facility] CM Primary Care Navigator  04/22/2018  Carol Page 12-Aug-1935 [id number]   Went to seepatientat the bedside(after coming from a procedure) toidentify possible discharge needs. Patient reports having increased abdominal pain/ abdominal distention, constipation thathad led to thisadmission. (severe obstipation and proctitis)   Patient confirms Carol Silversmith, NP with [employer] at [facility] as her primary care provider.   [place] on [address] to obtain medications without difficulty.  Patient's daughter Carol Page- a nurse) has been managingmedications for her with use of "pillbox"systemfilledevery week.  Patient's daughterand son in-law Carol Page) have been driving andproviding transportation toherdoctors' appointments.  Patientlives with daughter and son in-law who serve as her primary caregivers at home.  Anticipated plan for discharge isskilled nursing facility (SNF- in process)for rehabilitation per therapy recommendation, prior to going back home.  Patientvoiced understandingto callprimarycareprovider'soffice once she returnshome,for a post discharge follow-upvisitwithin1- 2 weeksor sooner if needs arise.Patient letter (with PCP's contact number) was provided asa reminder.  Explained topatient about[facility] CM services available for health management andresourcesat homebut she denies any needs or concerns at this point. Patient verbalizedunderstandingto seekreferral from primary care provider to [facility] care management ifdeemed necessary and appropriatefor any servicesin the future.  [facility] care management information was provided for futureneeds thatshe may have.  Primary care provider's office is listed as providing transition of care (TOC) follow-up.   For additional questions please contact:  Edwena Felty A. Kisean Rollo, BSN, RN-BC [facility] PRIMARY CARE Navigator Cell: [phone]

## 2018-04-22 NOTE — Progress Notes (Signed)
PROGRESS NOTE    Carol Page  IRS:[id number] DOB: June 19, 1936 DOA: 04/19/2018 PCP: Jearld Fenton, NP  Brief Narrative: Carol Morinis a 82 y.o.femalewith medical history significant ofchronic constipation, pain, hypertension morbid obesity, and seizure disorder presented to ED on 10/1 with abdominal pain. Patient lives at home with her daughter and son-in-law [name]. Patient has ongoing constipation. She takes lactulose at baseline. Over the previous week she had exacerbation of her symptoms and her last bowel movement was September 26. They  tried increasing her lactulose as well as numerous fleets enema over previous few days without relief. -Due to worsening symptoms presented to the emergency room, noted to have severe obstipation and proctitis   Assessment & Plan:   Obstipation in the background of chronic constipation -admitted with severe abdominal pain and nausea -Finally improving, started having bowel movements after Gatorade/MiraLAX -Appreciate gastroenterology consult -Patient declined colonoscopy -advance to regular diet -Transition from lactulose to MiraLAX twice daily and stool softener at discharge -Physical therapy evaluation completed, SNF recommended  Mild transient left face and left upper extremity numbness -Was noted early this morning when she woke up around 5 AM, did not mention this to anybody until OT saw her at 10 AM yesterday, symptoms resolved -Prior history of stroke with left-sided weakness and numbness which had resolved -Restarted Plavix, unclear why she had stopped this I called patient's daughter who also was not able to give me clear information, continue statin -CT head was unremarkable, d/w neuro, felt this could be recrudescence of her prior CVA Head, check carotid duplex  AKI (acute kidney injury) ([place])  -continue to hold ace inhibitor -Creatinine stable at 1.3 which may be her baseline  Essential hypertension -Home regimen  of ACE inhibitor and HCTZ on hold  Severe obesity (BMI >= 40) ([place]): BMI 48.6. Nutritional consult  Seizure disorder ([place]) -continue regimen of phenytoin and gabapentin  Anxiety and depression:  -stable -continue SSRI, Elavil, trazodone  Code Status: dnr Family Communication: none present Disposition Plan: SNF tomorrow if stable   Consultants:   Neurology   Procedures:   Antimicrobials:    Subjective: -continues to have multiple BMs, no neuro symptoms  Objective: Vitals:   04/21/18 0504 04/21/18 1626 04/21/18 2147 04/22/18 0558  BP: (!) 154/66 (!) 148/51 (!) 130/55 (!) 156/59  Pulse: 83 82 85 79  Resp: 18 18 20 16   Temp: 98.4 F (36.9 C) 99.3 F (37.4 C) 100.2 F (37.9 C) 98.4 F (36.9 C)  TempSrc: Oral Oral Oral Oral  SpO2: 91% 95% 95% 99%  Weight:      Height:        Intake/Output Summary (Last 24 hours) at 04/22/2018 1232 Last data filed at 04/22/2018 1018 Gross per 24 hour  Intake 1590 ml  Output -  Net 1590 ml   Filed Weights   04/20/18 0045  Weight: 121.2 kg    Examination:  Gen: Awake, Alert, Oriented X 3, no distress, obese HEENT: PERRLA, Neck supple, no JVD Lungs: Good air movement bilaterally, CTAB CVS: RRR,No Gallops,Rubs or new Murmurs Abd: soft, Non tender, non distended, BS present, obese, BS present Extremities: No Cyanosis, Clubbing or edema Skin: no new rashes Psychiatry: Judgement and insight appear normal. Anxious     Data Reviewed:   CBC: Recent Labs  Lab 04/19/18 1550 04/20/18 0545  WBC 11.0* 10.9*  HGB 11.6* 10.6*  HCT 35.6* 32.4*  MCV 102.3* 101.9*  PLT 215 009   Basic Metabolic Panel: Recent Labs  Lab 04/19/18 1550  04/20/18 0545 04/21/18 0436  NA 136 139 137  K 3.7 3.5 3.7  CL 102 105 105  CO2 24 25 25   GLUCOSE 141* 118* 110*  BUN 30* 28* 24*  CREATININE 1.48* 1.28* 1.30*  CALCIUM 8.9 8.2* 8.2*   GFR: Estimated Creatinine Clearance: 42.8 mL/min (A) (by C-G formula based on SCr of 1.3 mg/dL  (H)). Liver Function Tests: Recent Labs  Lab 04/19/18 1550  AST 17  ALT 22  ALKPHOS 67  BILITOT 0.6  PROT 6.6  ALBUMIN 3.6   Recent Labs  Lab 04/19/18 1550  LIPASE 34   No results for input(s): AMMONIA in the last 168 hours. Coagulation Profile: No results for input(s): INR, PROTIME in the last 168 hours. Cardiac Enzymes: No results for input(s): CKTOTAL, CKMB, CKMBINDEX, TROPONINI in the last 168 hours. BNP (last 3 results) No results for input(s): PROBNP in the last 8760 hours. HbA1C: No results for input(s): HGBA1C in the last 72 hours. CBG: No results for input(s): GLUCAP in the last 168 hours. Lipid Profile: No results for input(s): CHOL, HDL, LDLCALC, TRIG, CHOLHDL, LDLDIRECT in the last 72 hours. Thyroid Function Tests: Recent Labs    04/21/18 0436  TSH 3.732   Anemia Panel: Recent Labs    04/21/18 0436  VITAMINB12 233   Urine analysis:    Component Value Date/Time   COLORURINE AMBER (A) 08/13/2015 1107   APPEARANCEUR CLEAR 08/13/2015 1107   LABSPEC 1.024 08/13/2015 1107   PHURINE 5.0 08/13/2015 1107   GLUCOSEU NEGATIVE 08/13/2015 1107   GLUCOSEU NEGATIVE 11/09/2012 1537   HGBUR NEGATIVE 08/13/2015 1107   BILIRUBINUR LARGE (A) 08/13/2015 1107   KETONESUR 15 (A) 08/13/2015 1107   PROTEINUR NEGATIVE 08/13/2015 1107   UROBILINOGEN 1.0 05/04/2015 1505   NITRITE POSITIVE (A) 08/13/2015 1107   LEUKOCYTESUR SMALL (A) 08/13/2015 1107   Sepsis Labs: @LABRCNTIP (procalcitonin:4,lacticidven:4)  )No results found for this or any previous visit (from the past 240 hour(s)).       Radiology Studies: Ct Head Wo Contrast  Result Date: 04/21/2018 CLINICAL DATA:  Difficulty with ambulation. Upper and lower extremity weakness EXAM: CT HEAD WITHOUT CONTRAST TECHNIQUE: Contiguous axial images were obtained from the base of the skull through the vertex without intravenous contrast. COMPARISON:  August 13, 2015 FINDINGS: Brain: Mild diffuse atrophy is stable.  There is no intracranial mass, hemorrhage, extra-axial fluid collection, or midline shift. There is small vessel disease in the centra semiovale bilaterally, somewhat more on the left anteriorly than elsewhere, stable. Elsewhere, brain parenchyma appears unremarkable and stable. There is no evident acute infarct. Vascular: No hyperdense vessels. There is calcification in each carotid siphon region. Skull: The bony calvarium appears intact. Sinuses/Orbits: There is opacification in the inferior right maxillary antrum. There is opacification in multiple ethmoid air cells bilaterally. There is leftward deviation of the nasal septum. Orbits appear symmetric bilaterally. Other: There is opacification in several inferior mastoid air cells, stable. Mastoids elsewhere clear. IMPRESSION: Stable atrophy with periventricular small vessel disease. No acute infarct is appreciable. No mass or hemorrhage. There are foci of arterial vascular calcification. There are foci of paranasal sinus disease and mastoid disease. There is leftward deviation of the nasal septum. Electronically Signed   By: Lowella Grip III M.D.   On: 04/21/2018 11:44        Scheduled Meds: . acetaminophen  1,000 mg Oral Q8H  . amitriptyline  25 mg Oral QHS  . atorvastatin  80 mg Oral Daily  . clopidogrel  75 mg  Oral Daily  . diltiazem  180 mg Oral Daily  . enoxaparin (LOVENOX) injection  40 mg Subcutaneous Q24H  . escitalopram  10 mg Oral Daily  . gabapentin  200 mg Oral BID  . gabapentin  300 mg Oral BH-q7a  . loratadine  10 mg Oral Daily  . LORazepam  0.5 mg Oral Daily  . phenytoin  100 mg Oral Daily  . [START ON 04/23/2018] polyethylene glycol  17 g Oral Daily  . traZODone  150 mg Oral QHS   Continuous Infusions:   LOS: 2 days    Time spent: 5min    Domenic Polite, MD [facility] Page via [email or web address], password [email or web address] After 7PM please contact night-coverage  04/22/2018, 12:32 PM

## 2018-04-23 NOTE — Plan of Care (Signed)
  Problem: Pain Managment: Goal: General experience of comfort will improve Outcome: Progressing   Problem: Safety: Goal: Ability to remain free from injury will improve Outcome: Progressing   

## 2018-04-23 NOTE — Progress Notes (Signed)
PROGRESS NOTE    Carol Page  WCB:[id number] DOB: [birth year] DOA: 04/19/2018 PCP: Jearld Fenton, NP  Brief Narrative: Kym Morinis a 82 y.o.femalewith medical history significant ofchronic constipation, pain, hypertension morbid obesity, and seizure disorder presented to ED on 10/1 with abdominal pain. Patient lives at home with her daughter and son-in-law [name]. Patient has ongoing constipation. She takes lactulose at baseline. Over the previous week she had exacerbation of her symptoms and her last bowel movement was September 26. They  tried increasing her lactulose as well as numerous fleets enema over previous few days without relief. -Due to worsening symptoms presented to the emergency room, noted to have severe obstipation and proctitis   Assessment & Plan:   Obstipation in the background of chronic constipation -admitted with severe abdominal pain and nausea -Finally improving, started having bowel movements after Gatorade/MiraLAX -Appreciate gastroenterology consult -Patient declined colonoscopy -Clinically improving, diet advanced, -Transition from lactulose to MiraLAX twice daily and stool softener at discharge -Physical therapy evaluation completed, SNF recommended -Added hydrocortisone cream for hemorrhoids -Discharge planning, SNF tomorrow if stable  Mild transient left face and left upper extremity numbness -Was noted early 10/3 morning when she woke up around 5 AM, symptoms resolved in few min -Prior history of stroke with left-sided weakness and numbness which had resolved -Restarted Plavix, unclear why she had stopped this I called patient's daughter who also was not able to give me clear information, continue statin -CT head was unremarkable, d/w neuro, felt this could be recrudescence of her prior CVA, carotid duplex noted 1 to 35% right ICA stenosis and 40-59% on left  AKI (acute kidney injury) ([place])  -continue to hold ace  inhibitor -Creatinine stable at 1.3 which may be her baseline  Essential hypertension -Home regimen of ACE inhibitor on hold -Resumed HCTZ  Severe obesity (BMI >= 40) ([place]): BMI 48.6. Nutritional consult  Seizure disorder ([employer]) -continue regimen of phenytoin and gabapentin  Anxiety and depression:  -stable -continue SSRI, Elavil, trazodone  Code Status: dnr Family Communication: none present Disposition Plan: SNF tomorrow if stable   Consultants:   Neurology   Procedures:   Antimicrobials:    Subjective: -Feels a little better, hemorrhoids still continue to bother her a lot, BMs have slowed down  Objective: Vitals:   04/22/18 1309 04/22/18 2050 04/23/18 0604 04/23/18 1338  BP: (!) 146/99 (!) 136/98 (!) 170/84 (!) 175/64  Pulse: 79 80 75 78  Resp: 16 18 18 18   Temp: 98.5 F (36.9 C) 98.2 F (36.8 C)  98.2 F (36.8 C)  TempSrc: Oral Oral  Oral  SpO2: 96% 97% 95% 94%  Weight:      Height:       No intake or output data in the 24 hours ending 04/23/18 1506 Filed Weights   04/20/18 0045  Weight: 121.2 kg    Examination:  Gen: Awake, Alert, Oriented X 3, obese elderly female, no distress HEENT: PERRLA, Neck supple, no JVD Lungs: Good air movement bilaterally, CTAB CVS: S1S2/RRR Abd: soft, Non tender, non distended, BS present Extremities: No Cyanosis, Clubbing or edema Skin: no new rashes Psychiatry: Judgement and insight appear normal. Anxious     Data Reviewed:   CBC: Recent Labs  Lab 04/19/18 1550 04/20/18 0545  WBC 11.0* 10.9*  HGB 11.6* 10.6*  HCT 35.6* 32.4*  MCV 102.3* 101.9*  PLT 215 616   Basic Metabolic Panel: Recent Labs  Lab 04/19/18 1550 04/20/18 0545 04/21/18 0436  NA 136 139 137  K  3.7 3.5 3.7  CL 102 105 105  CO2 24 25 25   GLUCOSE 141* 118* 110*  BUN 30* 28* 24*  CREATININE 1.48* 1.28* 1.30*  CALCIUM 8.9 8.2* 8.2*   GFR: Estimated Creatinine Clearance: 42.8 mL/min (A) (by C-G formula based on SCr of 1.3  mg/dL (H)). Liver Function Tests: Recent Labs  Lab 04/19/18 1550  AST 17  ALT 22  ALKPHOS 67  BILITOT 0.6  PROT 6.6  ALBUMIN 3.6   Recent Labs  Lab 04/19/18 1550  LIPASE 34   No results for input(s): AMMONIA in the last 168 hours. Coagulation Profile: No results for input(s): INR, PROTIME in the last 168 hours. Cardiac Enzymes: No results for input(s): CKTOTAL, CKMB, CKMBINDEX, TROPONINI in the last 168 hours. BNP (last 3 results) No results for input(s): PROBNP in the last 8760 hours. HbA1C: No results for input(s): HGBA1C in the last 72 hours. CBG: No results for input(s): GLUCAP in the last 168 hours. Lipid Profile: No results for input(s): CHOL, HDL, LDLCALC, TRIG, CHOLHDL, LDLDIRECT in the last 72 hours. Thyroid Function Tests: Recent Labs    04/21/18 0436  TSH 3.732   Anemia Panel: Recent Labs    04/21/18 0436  VITAMINB12 233   Urine analysis:    Component Value Date/Time   COLORURINE AMBER (A) 08/13/2015 1107   APPEARANCEUR CLEAR 08/13/2015 1107   LABSPEC 1.024 08/13/2015 1107   PHURINE 5.0 08/13/2015 1107   GLUCOSEU NEGATIVE 08/13/2015 1107   GLUCOSEU NEGATIVE 11/09/2012 1537   HGBUR NEGATIVE 08/13/2015 1107   BILIRUBINUR LARGE (A) 08/13/2015 1107   KETONESUR 15 (A) 08/13/2015 1107   PROTEINUR NEGATIVE 08/13/2015 1107   UROBILINOGEN 1.0 05/04/2015 1505   NITRITE POSITIVE (A) 08/13/2015 1107   LEUKOCYTESUR SMALL (A) 08/13/2015 1107   Sepsis Labs: @LABRCNTIP (procalcitonin:4,lacticidven:4)  )No results found for this or any previous visit (from the past 240 hour(s)).       Radiology Studies: No results found.      Scheduled Meds: . acetaminophen  1,000 mg Oral Q8H  . amitriptyline  25 mg Oral QHS  . atorvastatin  80 mg Oral Daily  . clopidogrel  75 mg Oral Daily  . diltiazem  180 mg Oral Daily  . enoxaparin (LOVENOX) injection  40 mg Subcutaneous Q24H  . escitalopram  10 mg Oral Daily  . gabapentin  200 mg Oral BID  . gabapentin   300 mg Oral BH-q7a  . hydrochlorothiazide  25 mg Oral Daily  . hydrocortisone   Rectal TID  . loratadine  10 mg Oral Daily  . LORazepam  0.5 mg Oral Daily  . phenytoin  100 mg Oral Daily  . polyethylene glycol  17 g Oral Daily  . potassium chloride  40 mEq Oral Daily  . traZODone  150 mg Oral QHS   Continuous Infusions:   LOS: 3 days    Time spent: 54min    Domenic Polite, MD [facility] Page via [email or web address], password [email or web address] After 7PM please contact night-coverage  04/23/2018, 3:06 PM

## 2018-04-24 MED ORDER — CLONIDINE HCL 0.2 MG PO TABS
0.2000 mg | ORAL_TABLET | Freq: Once | ORAL | Status: AC
  Administered 2018-04-24: 0.2 mg via ORAL
  Filled 2018-04-24: qty 1

## 2018-04-24 MED ORDER — HYDROCORTISONE 2.5 % RE CREA: TOPICAL_CREAM | Freq: Two times a day (BID) | RECTAL | 0 refills | Status: DC | PRN

## 2018-04-24 MED ORDER — BISACODYL 5 MG PO TBEC: 10.0000 mg | DELAYED_RELEASE_TABLET | Freq: Every day | ORAL | 0 refills | Status: DC | PRN

## 2018-04-24 MED ORDER — POTASSIUM CHLORIDE CRYS ER 20 MEQ PO TBCR: 20.0000 meq | EXTENDED_RELEASE_TABLET | Freq: Every day | ORAL | Status: DC

## 2018-04-24 MED ORDER — TRAZODONE HCL 50 MG PO TABS: 50.0000 mg | ORAL_TABLET | Freq: Every day | ORAL | 0 refills | Status: DC

## 2018-04-24 MED ORDER — LORAZEPAM 1 MG PO TABS: 0.5000 mg | ORAL_TABLET | Freq: Every day | ORAL | 0 refills | Status: DC

## 2018-04-24 MED ORDER — SENNOSIDES-DOCUSATE SODIUM 8.6-50 MG PO TABS: 1.0000 | ORAL_TABLET | Freq: Two times a day (BID) | ORAL | Status: DC

## 2018-04-24 MED ORDER — LABETALOL HCL 5 MG/ML IV SOLN
10.0000 mg | Freq: Once | INTRAVENOUS | Status: DC
  Filled 2018-04-24: qty 4

## 2018-04-24 MED ORDER — AMLODIPINE BESYLATE 10 MG PO TABS: 10.0000 mg | ORAL_TABLET | Freq: Every day | ORAL | 11 refills | Status: DC

## 2018-04-24 MED ORDER — GABAPENTIN 100 MG PO CAPS: 100.0000 mg | ORAL_CAPSULE | Freq: Every day | ORAL | Status: DC

## 2018-04-24 MED ORDER — POLYETHYLENE GLYCOL 3350 17 G PO PACK: 17.0000 g | PACK | Freq: Every day | ORAL | 0 refills | Status: DC

## 2018-04-24 MED ORDER — LISINOPRIL 20 MG PO TABS: 20.0000 mg | ORAL_TABLET | Freq: Every day | ORAL | Status: DC

## 2018-04-24 NOTE — Progress Notes (Addendum)
Paged provider notification of pt BP 184/83, HR 74. No new orders given.

## 2018-04-24 NOTE — Plan of Care (Signed)
  Problem: Education: Goal: Knowledge of General Education information will improve Description: Including pain rating scale, medication(s)/side effects and non-pharmacologic comfort measures Outcome: Progressing   Problem: Activity: Goal: Risk for activity intolerance will decrease Outcome: Progressing   Problem: Nutrition: Goal: Adequate nutrition will be maintained Outcome: Progressing   

## 2018-04-24 NOTE — Discharge Summary (Addendum)
Physician Discharge Summary  [name] XBM:[id number] DOB: [birth year] DOA: 04/19/2018  PCP: Carol Fenton, NP  Admit date: 04/19/2018 Discharge date: 04/25/2018  Time spent: 35 minutes  Recommendations for Outpatient Follow-up:  PCP in 1 week, Polypharmacy, please consider attempting to wean numerous psychotropic meds-she is on Elavil, Trazodone, Lexapro and ativan at baseline  Discharge Diagnoses:  Principal Problem:   Obstipation   Chronic constipation   Morbid Obesity   Essential hypertension   Severe obesity (BMI >= 40) ([facility])   Seizure disorder ([facility])   Anxiety and depression   AKI (acute kidney injury) ([place])   Constipation   Discharge Condition: improved  Diet recommendation: heart healthy  Filed Weights   04/20/18 0045  Weight: 121.2 kg    History of present illness:  Carol Page a 82 y.o.femalewith medical history significant ofchronic constipation, pain, hypertension morbid obesity, and seizure disorder presented to ED on 10/1with abdominal pain. Patient lives at home with her daughter and son-in-law Carol Page. Patient has ongoing constipation. She takes lactulose at baseline. Over thepreviousweek she had exacerbation of her symptoms and her last bowel movement was September 26. They tried increasing her lactulose as well as numerous fleets enema over previousfew days without relief. -Due to worsening symptoms presented to the emergency room, noted to have severe obstipation and proctitis  Hospital Course:   Obstipation in the background of chronic constipation -admitted with severe abdominal pain and nausea -long h/o chronic constipation,  -Improved, started having bowel movements after Gatorade/MiraLAX -Gastroenterology consulted, Patient declined colonoscopy -Clinically improving, diet advanced, -Transition from lactulose to MiraLAX twice daily and stool softener at discharge -Physical therapy evaluation completed, SNF  recommended -Added hydrocortisone cream for hemorrhoids -Discharge planning, SNF today for Rehab  Mild transient left face and left upper extremity numbness -Was noted early 10/3 morning when she woke up around 5 AM, symptoms resolved in few min -Prior history of stroke with left-sided weakness and numbness which had resolved -Restarted Plavix, unclear why she had stopped this I called patient's daughter who also was not able to give me clear information, continue statin -CT head was unremarkable, d/w neuro, felt this could be recrudescence of her prior CVA, carotid duplex noted 1 to 35% right ICA stenosis and 40-59% on left -stable now  AKI (acute kidney injury) ([place])  -held ACE inhibitor -Creatinine stable at 1.3 which may be her baseline  Essential hypertension -BP poorly controlled, resumed HCTZ and added Norvasc  Severe obesity (BMI >= 40) ([place]): BMI 48.6.  -life style modification recommended  Seizure disorder ([place]) -continue regimen of phenytoin and gabapentin  Anxiety and depression:  -stable -on SSRI, Elavil, trazodone   Discharge Exam: Vitals:   04/23/18 2106 04/24/18 0452  BP: (!) 184/84 (!) 184/83  Pulse: 79 74  Resp: 18 18  Temp: 98.5 F (36.9 C) 97.6 F (36.4 C)  SpO2: 97% 98%    General: AAOx3 Cardiovascular: S1S2/RRR Respiratory: CTAB  Discharge Instructions   Discharge Instructions    Diet - low sodium heart healthy   Complete by:  As directed    Diet - low sodium heart healthy   Complete by:  As directed    Increase activity slowly   Complete by:  As directed    Increase activity slowly   Complete by:  As directed      Allergies as of 04/24/2018      Reactions   Codeine Nausea And Vomiting   Ether Nausea And Vomiting      Medication  List    STOP taking these medications   lisinopril 10 MG tablet Commonly known as:  PRINIVIL,ZESTRIL     TAKE these medications   acetaminophen 500 MG tablet Commonly known as:   TYLENOL Take 1,000 mg by mouth every 8 (eight) hours.   amitriptyline 25 MG tablet Commonly known as:  ELAVIL Take 1 tablet (25 mg total) by mouth at bedtime.   amLODipine 10 MG tablet Commonly known as:  NORVASC Take 1 tablet (10 mg total) by mouth daily.   atorvastatin 80 MG tablet Commonly known as:  LIPITOR TAKE 1 TABLET BY MOUTH ONCE DAILY   bisacodyl 5 MG EC tablet Commonly known as:  DULCOLAX Take 2 tablets (10 mg total) by mouth daily as needed for moderate constipation.   CALCIUM PO Take 1 tablet by mouth daily.   cholecalciferol 1000 units tablet Commonly known as:  VITAMIN D Take 1,000 Units by mouth daily.   clopidogrel 75 MG tablet Commonly known as:  PLAVIX TAKE 1 TABLET BY MOUTH IN THE EVENING   diltiazem 180 MG 24 hr capsule Commonly known as:  CARDIZEM CD Take 1 capsule (180 mg total) by mouth daily.   escitalopram 10 MG tablet Commonly known as:  LEXAPRO TAKE 1 TABLET BY MOUTH ONCE DAILY   gabapentin 300 MG capsule Commonly known as:  NEURONTIN TAKE 1 CAPSULE BY MOUTH IN THE MORNING What changed:    how much to take  how to take this  when to take this  additional instructions   gabapentin 100 MG capsule Commonly known as:  NEURONTIN Take 1 capsule (100 mg total) by mouth at bedtime. What changed:    how much to take  how to take this  when to take this  additional instructions   hydrochlorothiazide 12.5 MG capsule Commonly known as:  MICROZIDE Take 1 capsule (12.5 mg total) by mouth daily. What changed:  Another medication with the same name was removed. Continue taking this medication, and follow the directions you see here.   hydrocortisone 2.5 % rectal cream Commonly known as:  ANUSOL-HC Place rectally 2 (two) times daily as needed for hemorrhoids or anal itching.   loratadine 10 MG tablet Commonly known as:  CLARITIN Take 10 mg by mouth daily.   LORazepam 1 MG tablet Commonly known as:  ATIVAN Take 0.5 tablets (0.5  mg total) by mouth daily.   nystatin powder Commonly known as:  MYCOSTATIN/NYSTOP Apply topically 4 (four) times daily. What changed:    how much to take  when to take this  reasons to take this   PHENYTOIN INFATABS 50 MG tablet Generic drug:  phenytoin TAKE 2 TABLETS BY MOUTH ONCE DAILY   polyethylene glycol packet Commonly known as:  MIRALAX / GLYCOLAX Take 17 g by mouth daily. Start taking on:  04/25/2018   potassium chloride SA 20 MEQ tablet Commonly known as:  K-DUR,KLOR-CON Take 1 tablet (20 mEq total) by mouth daily. Start taking on:  04/25/2018   senna-docusate 8.6-50 MG tablet Commonly known as:  Senokot-S Take 1 tablet by mouth 2 (two) times daily.   traZODone 50 MG tablet Commonly known as:  DESYREL Take 1 tablet (50 mg total) by mouth at bedtime. What changed:  how much to take   vitamin B-12 1000 MCG tablet Commonly known as:  CYANOCOBALAMIN Take 1,000 mcg by mouth daily.      Allergies  Allergen Reactions  . Codeine Nausea And Vomiting  . Ether Nausea And Vomiting  Contact information for follow-up providers    Carol Fenton, NP. Schedule an appointment as soon as possible for a visit in 1 week(s).   Specialties:  Internal Medicine, Emergency Medicine Contact information: [place] [address] [phone]            Contact information for after-discharge care    Destination    [facility] Preferred SNF .   Service:  Skilled Nursing Contact information: [place] [phone]                   The results of significant diagnostics from this hospitalization (including imaging, microbiology, ancillary and laboratory) are listed below for reference.    Significant Diagnostic Studies: Ct Head Wo Contrast  Result Date: 04/21/2018 CLINICAL DATA:  Difficulty with ambulation. Upper and lower extremity weakness EXAM: CT HEAD WITHOUT CONTRAST  TECHNIQUE: Contiguous axial images were obtained from the base of the skull through the vertex without intravenous contrast. COMPARISON:  August 13, 2015 FINDINGS: Brain: Mild diffuse atrophy is stable. There is no intracranial mass, hemorrhage, extra-axial fluid collection, or midline shift. There is small vessel disease in the centra semiovale bilaterally, somewhat more on the left anteriorly than elsewhere, stable. Elsewhere, brain parenchyma appears unremarkable and stable. There is no evident acute infarct. Vascular: No hyperdense vessels. There is calcification in each carotid siphon region. Skull: The bony calvarium appears intact. Sinuses/Orbits: There is opacification in the inferior right maxillary antrum. There is opacification in multiple ethmoid air cells bilaterally. There is leftward deviation of the nasal septum. Orbits appear symmetric bilaterally. Other: There is opacification in several inferior mastoid air cells, stable. Mastoids elsewhere clear. IMPRESSION: Stable atrophy with periventricular small vessel disease. No acute infarct is appreciable. No mass or hemorrhage. There are foci of arterial vascular calcification. There are foci of paranasal sinus disease and mastoid disease. There is leftward deviation of the nasal septum. Electronically Signed   By: Lowella Grip III M.D.   On: 04/21/2018 11:44   Ct Abdomen Pelvis W Contrast  Result Date: 04/19/2018 CLINICAL DATA:  Hemorrhoids without relief for 1 week. Abdominal distension. EXAM: CT ABDOMEN AND PELVIS WITH CONTRAST TECHNIQUE: Multidetector CT imaging of the abdomen and pelvis was performed using the standard protocol following bolus administration of intravenous contrast. CONTRAST:  61mL OMNIPAQUE IOHEXOL 300 MG/ML  SOLN COMPARISON:  None. FINDINGS: Lower chest: Mild atelectasis of posterior lung bases are noted. The heart size is normal. Hepatobiliary: 5 mm cyst is identified in the right lobe liver. The liver is otherwise  unremarkable. The gallbladder is normal. The biliary tree is normal. Pancreas: Unremarkable. No pancreatic ductal dilatation or surrounding inflammatory changes. Spleen: Normal in size without focal abnormality. Adrenals/Urinary Tract: The adrenal glands are normal. Simple cysts is identified in the midpole right kidney. There is a 2 mm nonobstructing stone in lower pole left kidney. No hydronephrosis is identified bilaterally. The bladder is normal. Stomach/Bowel: There is a small hiatal hernia. The stomach is otherwise normal. There is no small bowel obstruction. No colonic obstruction is identified. The appendix is not seen but no inflammation is noted around cecum. There is generalized edema and swelling in the distal rectal wall. Vascular/Lymphatic: Aortic atherosclerosis. No enlarged abdominal or pelvic lymph nodes. Reproductive: Uterus and bilateral adnexa are unremarkable. Other: None. Musculoskeletal: Degenerative joint changes of the spine are identified. Degenerative anterolisthesis of L5 on S1 is noted. IMPRESSION: Generalized edema and swelling in the distal  rectal wall, this can reflect internal hemorrhoids. Liver and right kidney cysts. 2 mm nonobstructing stone in lower pole left kidney. Electronically Signed   By: Abelardo Diesel M.D.   On: 04/19/2018 18:24    Microbiology: No results found for this or any previous visit (from the past 240 hour(s)).   Labs: Basic Metabolic Panel: Recent Labs  Lab 04/19/18 1550 04/20/18 0545 04/21/18 0436  NA 136 139 137  K 3.7 3.5 3.7  CL 102 105 105  CO2 24 25 25   GLUCOSE 141* 118* 110*  BUN 30* 28* 24*  CREATININE 1.48* 1.28* 1.30*  CALCIUM 8.9 8.2* 8.2*   Liver Function Tests: Recent Labs  Lab 04/19/18 1550  AST 17  ALT 22  ALKPHOS 67  BILITOT 0.6  PROT 6.6  ALBUMIN 3.6   Recent Labs  Lab 04/19/18 1550  LIPASE 34   No results for input(s): AMMONIA in the last 168 hours. CBC: Recent Labs  Lab 04/19/18 1550 04/20/18 0545   WBC 11.0* 10.9*  HGB 11.6* 10.6*  HCT 35.6* 32.4*  MCV 102.3* 101.9*  PLT 215 207   Cardiac Enzymes: No results for input(s): CKTOTAL, CKMB, CKMBINDEX, TROPONINI in the last 168 hours. BNP: BNP (last 3 results) No results for input(s): BNP in the last 8760 hours.  ProBNP (last 3 results) No results for input(s): PROBNP in the last 8760 hours.  CBG: No results for input(s): GLUCAP in the last 168 hours.     Signed:  Domenic Polite MD.  [facility] 04/24/2018, 11:22 AM

## 2018-04-25 MED ORDER — POLYETHYLENE GLYCOL 3350 17 G PO PACK: 17.0000 g | PACK | Freq: Every day | ORAL | Status: DC | PRN

## 2018-04-25 MED ORDER — POTASSIUM CHLORIDE CRYS ER 20 MEQ PO TBCR
20.0000 meq | EXTENDED_RELEASE_TABLET | Freq: Every day | ORAL | Status: DC
  Administered 2018-04-26: 20 meq via ORAL
  Filled 2018-04-25: qty 1

## 2018-04-25 NOTE — Progress Notes (Signed)
PROGRESS NOTE    Carol Page  GUR:[id number] DOB: Jul 29, 1935 DOA: 04/19/2018 PCP: Jearld Fenton, NP  Brief Narrative: Carol Page a 82 y.o.femalewith medical history significant ofchronic constipation, pain, hypertension morbid obesity, and seizure disorder presented to ED on 10/1 with abdominal pain. Patient lives at home with her daughter and son-in-law Carol Page. Patient has ongoing constipation. She takes lactulose at baseline. Over the previous week she had exacerbation of her symptoms and her last bowel movement was September 26. They  tried increasing her lactulose as well as numerous fleets enema over previous few days without relief. -Due to worsening symptoms presented to the emergency room, noted to have severe obstipation and proctitis   Assessment & Plan:   Obstipation in the background of chronic constipation -admitted with severe abdominal pain and nausea -Finally improving, started having bowel movements after Gatorade/MiraLAX -Appreciate gastroenterology consult -Patient declined colonoscopy -Clinically improving, diet advanced, -Transition from lactulose to MiraLAX twice daily and stool softener at discharge -Physical therapy evaluation completed, SNF recommended -Added hydrocortisone cream for hemorrhoids -Discharge to SNF pending below work-up  Recurrent transient left face and left upper extremity numbness -Was noted early 10/3 morning when she woke up around 5 AM, symptoms resolved in few min -Today 10/7 had recurrence of same, will check MRI brain -Prior history of stroke with left-sided weakness and numbness which had resolved -Continue statin and Plavix -I had restarted her Plavix after her symptoms on 10/3, unclear why this was discontinued few months ago patient and daughter unable to give details -CT head was unremarkable, d/w neuro, felt this could be recrudescence of her prior CVA, carotid duplex noted 1 to 35% right ICA stenosis and  40-59% on left  AKI (acute kidney injury) ([place])  -continue to hold ace inhibitor -Creatinine stable at 1.3 which may be her baseline  Essential hypertension -Home regimen of ACE inhibitor on hold -Resumed HCTZ  Severe obesity (BMI >= 40) ([place]): BMI 48.6. Nutritional consult  Seizure disorder ([place]) -continue regimen of phenytoin and gabapentin  Anxiety and depression:  -stable -continue SSRI, Elavil, trazodone  Code Status: dnr Family Communication: none present Disposition Plan: SNF pending work-up for strokelike symptoms   Consultants:   Gastroenterology   D/w neurology   Procedures:   Antimicrobials:    Subjective: -Was supposed to discharge to rehab today, this morning had 1-2 episodes of intermittent left arm and facial numbness  Objective: Vitals:   04/25/18 0452 04/25/18 1100 04/25/18 1110 04/25/18 1300  BP: (!) 163/71 (!) 160/76 124/76 133/64  Pulse: 71 83  72  Resp: 18     Temp: 98.2 F (36.8 C) (!) 97.2 F (36.2 C)  97.9 F (36.6 C)  TempSrc: Oral   Oral  SpO2: 97% 98%  99%  Weight:      Height:        Intake/Output Summary (Last 24 hours) at 04/25/2018 1346 Last data filed at 04/25/2018 1000 Gross per 24 hour  Intake 240 ml  Output -  Net 240 ml   Filed Weights   04/20/18 0045  Weight: 121.2 kg    Examination:  Gen: Awake, Alert, Oriented X 3, obese elderly female, anxious HEENT: PERRLA, Neck supple, no JVD Lungs: Good air movement bilaterally, CTAB CVS: RRR,No Gallops,Rubs or new Murmurs Abd: Soft obese mildly distended, bowel sounds present Extremities: No Cyanosis, Clubbing or edema Skin: no new rashes Neuro: Moves all extremities no localizing, sensations intact left face and left upper extremity, normal motor strength in left upper  extremity as well    Data Reviewed:   CBC: Recent Labs  Lab 04/19/18 1550 04/20/18 0545  WBC 11.0* 10.9*  HGB 11.6* 10.6*  HCT 35.6* 32.4*  MCV 102.3* 101.9*  PLT 215 094   Basic  Metabolic Panel: Recent Labs  Lab 04/19/18 1550 04/20/18 0545 04/21/18 0436  NA 136 139 137  K 3.7 3.5 3.7  CL 102 105 105  CO2 24 25 25   GLUCOSE 141* 118* 110*  BUN 30* 28* 24*  CREATININE 1.48* 1.28* 1.30*  CALCIUM 8.9 8.2* 8.2*   GFR: Estimated Creatinine Clearance: 42.8 mL/min (A) (by C-G formula based on SCr of 1.3 mg/dL (H)). Liver Function Tests: Recent Labs  Lab 04/19/18 1550  AST 17  ALT 22  ALKPHOS 67  BILITOT 0.6  PROT 6.6  ALBUMIN 3.6   Recent Labs  Lab 04/19/18 1550  LIPASE 34   No results for input(s): AMMONIA in the last 168 hours. Coagulation Profile: No results for input(s): INR, PROTIME in the last 168 hours. Cardiac Enzymes: No results for input(s): CKTOTAL, CKMB, CKMBINDEX, TROPONINI in the last 168 hours. BNP (last 3 results) No results for input(s): PROBNP in the last 8760 hours. HbA1C: No results for input(s): HGBA1C in the last 72 hours. CBG: No results for input(s): GLUCAP in the last 168 hours. Lipid Profile: No results for input(s): CHOL, HDL, LDLCALC, TRIG, CHOLHDL, LDLDIRECT in the last 72 hours. Thyroid Function Tests: No results for input(s): TSH, T4TOTAL, FREET4, T3FREE, THYROIDAB in the last 72 hours. Anemia Panel: No results for input(s): VITAMINB12, FOLATE, FERRITIN, TIBC, IRON, RETICCTPCT in the last 72 hours. Urine analysis:    Component Value Date/Time   COLORURINE AMBER (A) 08/13/2015 1107   APPEARANCEUR CLEAR 08/13/2015 1107   LABSPEC 1.024 08/13/2015 1107   PHURINE 5.0 08/13/2015 1107   GLUCOSEU NEGATIVE 08/13/2015 1107   GLUCOSEU NEGATIVE 11/09/2012 1537   HGBUR NEGATIVE 08/13/2015 1107   BILIRUBINUR LARGE (A) 08/13/2015 1107   KETONESUR 15 (A) 08/13/2015 1107   PROTEINUR NEGATIVE 08/13/2015 1107   UROBILINOGEN 1.0 05/04/2015 1505   NITRITE POSITIVE (A) 08/13/2015 1107   LEUKOCYTESUR SMALL (A) 08/13/2015 1107   Sepsis Labs: @LABRCNTIP (procalcitonin:4,lacticidven:4)  )No results found for this or any  previous visit (from the past 240 hour(s)).       Radiology Studies: No results found.      Scheduled Meds: . acetaminophen  1,000 mg Oral Q8H  . amitriptyline  25 mg Oral QHS  . atorvastatin  80 mg Oral Daily  . clopidogrel  75 mg Oral Daily  . diltiazem  180 mg Oral Daily  . enoxaparin (LOVENOX) injection  40 mg Subcutaneous Q24H  . escitalopram  10 mg Oral Daily  . gabapentin  200 mg Oral BID  . gabapentin  300 mg Oral BH-q7a  . hydrochlorothiazide  25 mg Oral Daily  . hydrocortisone   Rectal TID  . loratadine  10 mg Oral Daily  . LORazepam  0.5 mg Oral Daily  . phenytoin  100 mg Oral Daily  . polyethylene glycol  17 g Oral Daily  . [START ON 04/26/2018] potassium chloride  20 mEq Oral Daily  . traZODone  150 mg Oral QHS   Continuous Infusions:   LOS: 5 days    Time spent: 42min    Domenic Polite, MD [facility] Page via [email or web address], password [email or web address] After 7PM please contact night-coverage  04/25/2018, 1:46 PM

## 2018-04-25 NOTE — Social Work (Signed)
CSW will need summary updated to today's date and will support pt transfer to [facility]. MD aware.  Alexander Mt, [place] Work [phone]

## 2018-04-25 NOTE — Social Work (Signed)
CSW spoke with bedside RN and RN Case Manager, pt not stable for discharge today- await results of MRI. Updated [facility], they will follow for pt medical stability, aware pt not likely to discharge today.  Alexander Mt, [place] Work [phone]

## 2018-04-25 NOTE — Progress Notes (Signed)
PT Cancellation Note  Patient Details Name: Carol Page MRN: [id number] DOB: 05/10/1936   Cancelled Treatment:    Reason Eval/Treat Not Completed: Other (comment).  Pt reports her hemorrhoids are really bothering her and she is worried about the pending MRI.  She would like to wait to walk with PT in the AM.    Thanks,  Wells Guiles B. Latha Staunton, PT, DPT  Acute Rehabilitation [phone] pager #[phone] office   04/25/2018, 4:27 PM

## 2018-04-25 NOTE — Care Management Important Message (Signed)
Important Message  Patient Details  Name: Carol Page MRN: [id number] Date of Birth: 03/18/1936   Medicare Important Message Given:  Yes  IM signed on 04/22/2018   Orbie Pyo 04/25/2018, 9:55 AM

## 2018-04-26 ENCOUNTER — Inpatient Hospital Stay (HOSPITAL_COMMUNITY): Payer: Medicare Other

## 2018-04-26 DIAGNOSIS — F329 Major depressive disorder, single episode, unspecified: Secondary | ICD-10-CM | POA: Diagnosis not present

## 2018-04-26 DIAGNOSIS — G4700 Insomnia, unspecified: Secondary | ICD-10-CM | POA: Diagnosis not present

## 2018-04-26 DIAGNOSIS — Z7901 Long term (current) use of anticoagulants: Secondary | ICD-10-CM | POA: Diagnosis not present

## 2018-04-26 DIAGNOSIS — G459 Transient cerebral ischemic attack, unspecified: Secondary | ICD-10-CM | POA: Diagnosis not present

## 2018-04-26 DIAGNOSIS — L290 Pruritus ani: Secondary | ICD-10-CM | POA: Diagnosis not present

## 2018-04-26 DIAGNOSIS — M79605 Pain in left leg: Secondary | ICD-10-CM | POA: Diagnosis not present

## 2018-04-26 DIAGNOSIS — M6281 Muscle weakness (generalized): Secondary | ICD-10-CM | POA: Diagnosis not present

## 2018-04-26 DIAGNOSIS — I10 Essential (primary) hypertension: Secondary | ICD-10-CM | POA: Diagnosis not present

## 2018-04-26 DIAGNOSIS — K5909 Other constipation: Secondary | ICD-10-CM | POA: Diagnosis not present

## 2018-04-26 DIAGNOSIS — R1084 Generalized abdominal pain: Secondary | ICD-10-CM | POA: Diagnosis not present

## 2018-04-26 DIAGNOSIS — K5900 Constipation, unspecified: Secondary | ICD-10-CM | POA: Diagnosis not present

## 2018-04-26 DIAGNOSIS — G40909 Epilepsy, unspecified, not intractable, without status epilepticus: Secondary | ICD-10-CM | POA: Diagnosis not present

## 2018-04-26 DIAGNOSIS — T7840XD Allergy, unspecified, subsequent encounter: Secondary | ICD-10-CM | POA: Diagnosis not present

## 2018-04-26 DIAGNOSIS — R2689 Other abnormalities of gait and mobility: Secondary | ICD-10-CM | POA: Diagnosis not present

## 2018-04-26 DIAGNOSIS — R21 Rash and other nonspecific skin eruption: Secondary | ICD-10-CM | POA: Diagnosis not present

## 2018-04-26 DIAGNOSIS — F419 Anxiety disorder, unspecified: Secondary | ICD-10-CM | POA: Diagnosis not present

## 2018-04-26 DIAGNOSIS — N179 Acute kidney failure, unspecified: Secondary | ICD-10-CM | POA: Diagnosis not present

## 2018-04-26 DIAGNOSIS — E785 Hyperlipidemia, unspecified: Secondary | ICD-10-CM | POA: Diagnosis not present

## 2018-04-26 DIAGNOSIS — R278 Other lack of coordination: Secondary | ICD-10-CM | POA: Diagnosis not present

## 2018-04-26 DIAGNOSIS — K649 Unspecified hemorrhoids: Secondary | ICD-10-CM | POA: Diagnosis not present

## 2018-04-26 DIAGNOSIS — R109 Unspecified abdominal pain: Secondary | ICD-10-CM | POA: Diagnosis not present

## 2018-04-26 DIAGNOSIS — Z6841 Body Mass Index (BMI) 40.0 and over, adult: Secondary | ICD-10-CM | POA: Diagnosis not present

## 2018-04-26 DIAGNOSIS — R202 Paresthesia of skin: Secondary | ICD-10-CM | POA: Diagnosis not present

## 2018-04-26 DIAGNOSIS — M1712 Unilateral primary osteoarthritis, left knee: Secondary | ICD-10-CM | POA: Diagnosis not present

## 2018-04-26 DIAGNOSIS — K6289 Other specified diseases of anus and rectum: Secondary | ICD-10-CM | POA: Diagnosis not present

## 2018-04-26 DIAGNOSIS — R2681 Unsteadiness on feet: Secondary | ICD-10-CM | POA: Diagnosis not present

## 2018-04-26 DIAGNOSIS — R609 Edema, unspecified: Secondary | ICD-10-CM | POA: Diagnosis not present

## 2018-04-26 LAB — CBC
HCT: 35.3 % — ABNORMAL LOW (ref 36.0–46.0)
Hemoglobin: 11.5 g/dL — ABNORMAL LOW (ref 12.0–15.0)
MCH: 33.3 pg (ref 26.0–34.0)
MCHC: 32.6 g/dL (ref 30.0–36.0)
MCV: 102.3 fL — AB (ref 80.0–100.0)
PLATELETS: 213 10*3/uL (ref 150–400)
RBC: 3.45 MIL/uL — AB (ref 3.87–5.11)
RDW: 12.6 % (ref 11.5–15.5)
WBC: 7.9 10*3/uL (ref 4.0–10.5)

## 2018-04-26 LAB — BASIC METABOLIC PANEL
Anion gap: 8 (ref 5–15)
BUN: 15 mg/dL (ref 8–23)
CO2: 28 mmol/L (ref 22–32)
CREATININE: 1.06 mg/dL — AB (ref 0.44–1.00)
Calcium: 8.6 mg/dL — ABNORMAL LOW (ref 8.9–10.3)
Chloride: 102 mmol/L (ref 98–111)
GFR calc Af Amer: 56 mL/min — ABNORMAL LOW (ref >60)
GFR, EST NON AFRICAN AMERICAN: 48 mL/min — AB (ref >60)
GLUCOSE: 102 mg/dL — AB (ref 70–99)
POTASSIUM: 4.1 mmol/L (ref 3.5–5.1)
SODIUM: 138 mmol/L (ref 135–145)

## 2018-04-26 NOTE — Progress Notes (Signed)
Report called to Kelly at [facility]. 

## 2018-04-26 NOTE — Progress Notes (Signed)
Attempted to give report to nurse at  [facility]. I was put on hold for 10 mins then told to call back.

## 2018-04-26 NOTE — Clinical Social Work Placement (Signed)
   CLINICAL SOCIAL WORK PLACEMENT  NOTE [facility] [place] [facility] RN to call report to [phone]  Date:  04/26/2018  Patient Details  Name: Carol Page MRN: [id number] Date of Birth: 1935-07-24  Clinical Social Work is seeking post-discharge placement for this patient at the [place] level of care (*CSW will initial, date and re-position this form in  chart as items are completed):  Yes   Patient/family provided with [facility] Work Department's list of facilities offering this level of care within the geographic area requested by the patient (or if unable, by the patient's family).  Yes   Patient/family informed of their freedom to choose among providers that offer the needed level of care, that participate in Medicare, Medicaid or managed care program needed by the patient, have an available bed and are willing to accept the patient.  Yes   Patient/family informed of [facility]'s ownership interest in [facility] and [facility], as well as of the fact that they are under no obligation to receive care at these facilities.  PASRR submitted to EDS on       PASRR number received on       Existing PASRR number confirmed on 04/20/18     FL2 transmitted to all facilities in geographic area requested by pt/family on 04/20/18     FL2 transmitted to all facilities within larger geographic area on       Patient informed that his/her managed care company has contracts with or will negotiate with certain facilities, including the following:        Yes   Patient/family informed of bed offers received.  Patient chooses bed at [facility], [place]     Physician recommends and patient chooses bed at      Patient to be transferred to [facility] [place] on 04/26/18.  Patient to be transferred to facility by [facility]     Patient family notified on 04/26/18 of transfer.  Name of family member notified:  pt daughter [name] via telephone      PHYSICIAN Please sign DNR     Additional Comment:    _______________________________________________ Alexander Mt, [place] 04/26/2018, 2:04 PM

## 2018-04-26 NOTE — Social Work (Signed)
Clinical Social Worker facilitated patient discharge including contacting patient family and facility to confirm patient discharge plans.  Clinical information faxed to facility and family agreeable with plan.  CSW arranged ambulance transport via [facility] to [facility] RN to call [phone] with report  prior to discharge.  Clinical Social Worker will sign off for now as social work intervention is no longer needed. Please consult [place] again if new need arises.  Alexander Mt, [place] Social Worker

## 2018-04-26 NOTE — Discharge Summary (Signed)
Physician Discharge Summary  Carol Page HEN:[id number] DOB: 1935/12/09 DOA: 04/19/2018  PCP: Carol Fenton, NP  Admit date: 04/19/2018 Discharge date: 04/25/2018  Time spent: 35 minutes  Recommendations for Outpatient Follow-up:  PCP in 1 week, Polypharmacy, please consider attempting to wean numerous psychotropic meds-she is on Elavil, Trazodone, Lexapro and ativan at baseline  Discharge Diagnoses:  Principal Problem:   Obstipation   Chronic constipation   Morbid Obesity   Essential hypertension   Severe obesity (BMI >= 40) (HCC)   Seizure disorder (HCC)   Anxiety and depression   AKI (acute kidney injury) ([place])   Constipation   Discharge Condition: improved  Diet recommendation: heart healthy  Filed Weights   04/20/18 0045  Weight: 121.2 kg    History of present illness:  Starlene Morinis a 82 y.o.femalewith medical history significant ofchronic constipation, pain, hypertension morbid obesity, and seizure disorder presented to ED on 10/1with abdominal pain. Patient lives at home with her daughter and son-in-law Carol Page. Patient has ongoing constipation. She takes lactulose at baseline. Over thepreviousweek she had exacerbation of her symptoms and her last bowel movement was September 26. They tried increasing her lactulose as well as numerous fleets enema over previousfew days without relief. -Due to worsening symptoms presented to the emergency room, noted to have severe obstipation and proctitis  Hospital Course:   Obstipation in the background of chronic constipation -admitted with severe abdominal pain and nausea -long h/o chronic constipation,  -Improved, started having bowel movements after Gatorade/MiraLAX -Gastroenterology consulted, Patient declined colonoscopy -Clinically improving, diet advanced, -Transition from lactulose to MiraLAX twice daily and stool softener at discharge -Physical therapy evaluation completed, SNF  recommended -Added hydrocortisone cream for hemorrhoids -Discharge planning, SNF today for Rehab  Mild transient left face and left upper extremity numbness -Was noted early 10/3 morning when she woke up around 5 AM, symptoms resolved in few min -Prior history of stroke with left-sided weakness and numbness which had resolved -Restarted Plavix, unclear why she had stopped this I called patient's daughter who also was not able to give me clear information, continue statin -CT head was unremarkable, called and d/w on call Neurology, felt this could be recrudescence of her prior CVA, carotid duplex noted 1 to 35% right ICA stenosis and 40-59% on left -she had mild Left face numbness again yesterday am-MRI completed today did not show any acute findings, evidence of old prior CVA noted -symptoms improved  AKI (acute kidney injury) ([place])  -held ACE inhibitor -Creatinine stable at 1.3 which may be her baseline  Essential hypertension -BP poorly controlled, resumed HCTZ and added Norvasc  Severe obesity (BMI >= 40) ([place]): BMI 48.6.  -life style modification recommended  Seizure disorder ([place]) -continue regimen of phenytoin and gabapentin  Anxiety and depression:  -stable -on SSRI, Elavil, trazodone   Discharge Exam: Vitals:   04/25/18 2147 04/26/18 0540  BP: (!) 140/55 (!) 159/61  Pulse: 75 73  Resp: 16 16  Temp: 98.1 F (36.7 C) 98.4 F (36.9 C)  SpO2: 96% 97%    General: AAOx3 Cardiovascular: S1S2/RRR Respiratory: CTAB  Discharge Instructions   Discharge Instructions    Diet - low sodium heart healthy   Complete by:  As directed    Diet - low sodium heart healthy   Complete by:  As directed    Diet - low sodium heart healthy   Complete by:  As directed    Increase activity slowly   Complete by:  As directed  Increase activity slowly   Complete by:  As directed    Increase activity slowly   Complete by:  As directed      Allergies as of 04/26/2018       Reactions   Codeine Nausea And Vomiting   Ether Nausea And Vomiting      Medication List    STOP taking these medications   lisinopril 10 MG tablet Commonly known as:  PRINIVIL,ZESTRIL     TAKE these medications   acetaminophen 500 MG tablet Commonly known as:  TYLENOL Take 1,000 mg by mouth every 8 (eight) hours.   amitriptyline 25 MG tablet Commonly known as:  ELAVIL Take 1 tablet (25 mg total) by mouth at bedtime.   amLODipine 10 MG tablet Commonly known as:  NORVASC Take 1 tablet (10 mg total) by mouth daily.   atorvastatin 80 MG tablet Commonly known as:  LIPITOR TAKE 1 TABLET BY MOUTH ONCE DAILY   bisacodyl 5 MG EC tablet Commonly known as:  DULCOLAX Take 2 tablets (10 mg total) by mouth daily as needed for moderate constipation.   CALCIUM PO Take 1 tablet by mouth daily.   cholecalciferol 1000 units tablet Commonly known as:  VITAMIN D Take 1,000 Units by mouth daily.   clopidogrel 75 MG tablet Commonly known as:  PLAVIX TAKE 1 TABLET BY MOUTH IN THE EVENING   diltiazem 180 MG 24 hr capsule Commonly known as:  CARDIZEM CD Take 1 capsule (180 mg total) by mouth daily.   escitalopram 10 MG tablet Commonly known as:  LEXAPRO TAKE 1 TABLET BY MOUTH ONCE DAILY   gabapentin 300 MG capsule Commonly known as:  NEURONTIN TAKE 1 CAPSULE BY MOUTH IN THE MORNING What changed:    how much to take  how to take this  when to take this  additional instructions   gabapentin 100 MG capsule Commonly known as:  NEURONTIN Take 1 capsule (100 mg total) by mouth at bedtime. What changed:    how much to take  how to take this  when to take this  additional instructions   hydrochlorothiazide 12.5 MG capsule Commonly known as:  MICROZIDE Take 1 capsule (12.5 mg total) by mouth daily. What changed:  Another medication with the same name was removed. Continue taking this medication, and follow the directions you see here.   hydrocortisone 2.5 % rectal  cream Commonly known as:  ANUSOL-HC Place rectally 2 (two) times daily as needed for hemorrhoids or anal itching.   loratadine 10 MG tablet Commonly known as:  CLARITIN Take 10 mg by mouth daily.   LORazepam 1 MG tablet Commonly known as:  ATIVAN Take 0.5 tablets (0.5 mg total) by mouth daily.   nystatin powder Commonly known as:  MYCOSTATIN/NYSTOP Apply topically 4 (four) times daily. What changed:    how much to take  when to take this  reasons to take this   PHENYTOIN INFATABS 50 MG tablet Generic drug:  phenytoin TAKE 2 TABLETS BY MOUTH ONCE DAILY   polyethylene glycol packet Commonly known as:  MIRALAX / GLYCOLAX Take 17 g by mouth daily.   potassium chloride SA 20 MEQ tablet Commonly known as:  K-DUR,KLOR-CON Take 1 tablet (20 mEq total) by mouth daily.   senna-docusate 8.6-50 MG tablet Commonly known as:  Senokot-S Take 1 tablet by mouth 2 (two) times daily.   traZODone 50 MG tablet Commonly known as:  DESYREL Take 1 tablet (50 mg total) by mouth at bedtime. What changed:  how much to take   vitamin B-12 1000 MCG tablet Commonly known as:  CYANOCOBALAMIN Take 1,000 mcg by mouth daily.      Allergies  Allergen Reactions  . Codeine Nausea And Vomiting  . Ether Nausea And Vomiting    Contact information for follow-up providers    Carol Fenton, NP. Schedule an appointment as soon as possible for a visit in 1 week(s).   Specialties:  Internal Medicine, Emergency Medicine Contact information: [place] [address] [phone]            Contact information for after-discharge care    Destination    [facility] Preferred SNF .   Service:  Skilled Nursing Contact information: [place] [phone]                   The results of significant diagnostics from this hospitalization (including imaging, microbiology, ancillary and laboratory) are  listed below for reference.    Significant Diagnostic Studies: Ct Head Wo Contrast  Result Date: 04/21/2018 CLINICAL DATA:  Difficulty with ambulation. Upper and lower extremity weakness EXAM: CT HEAD WITHOUT CONTRAST TECHNIQUE: Contiguous axial images were obtained from the base of the skull through the vertex without intravenous contrast. COMPARISON:  August 13, 2015 FINDINGS: Brain: Mild diffuse atrophy is stable. There is no intracranial mass, hemorrhage, extra-axial fluid collection, or midline shift. There is small vessel disease in the centra semiovale bilaterally, somewhat more on the left anteriorly than elsewhere, stable. Elsewhere, brain parenchyma appears unremarkable and stable. There is no evident acute infarct. Vascular: No hyperdense vessels. There is calcification in each carotid siphon region. Skull: The bony calvarium appears intact. Sinuses/Orbits: There is opacification in the inferior right maxillary antrum. There is opacification in multiple ethmoid air cells bilaterally. There is leftward deviation of the nasal septum. Orbits appear symmetric bilaterally. Other: There is opacification in several inferior mastoid air cells, stable. Mastoids elsewhere clear. IMPRESSION: Stable atrophy with periventricular small vessel disease. No acute infarct is appreciable. No mass or hemorrhage. There are foci of arterial vascular calcification. There are foci of paranasal sinus disease and mastoid disease. There is leftward deviation of the nasal septum. Electronically Signed   By: Lowella Grip III M.D.   On: 04/21/2018 11:44   Mr Brain Wo Contrast  Result Date: 04/26/2018 CLINICAL DATA:  Recurrent transient left face and left upper extremity numbness. EXAM: MRI HEAD WITHOUT CONTRAST TECHNIQUE: Multiplanar, multiecho pulse sequences of the brain and surrounding structures were obtained without intravenous contrast. COMPARISON:  Head CT 04/21/2018 FINDINGS: Brain: There is no evidence of acute  infarct, intracranial hemorrhage, mass, midline shift, or extra-axial fluid collection. There is moderate cerebral atrophy. Remote insults are noted in the globus pallidus bilaterally and could reflect a prior toxic exposure. Patchy to confluent cerebral white matter T2 hyperintensities are nonspecific but may reflect moderate chronic small vessel ischemic disease or the sequelae of a prior toxic or metabolic insult. Vascular: Major intracranial vascular flow voids are preserved. Skull and upper cervical spine: Unremarkable bone marrow signal. Sinuses/Orbits: Unremarkable orbits. Clear paranasal sinuses. Small mastoid effusions. Other: None. IMPRESSION: 1. No acute intracranial abnormality. 2. Remote bilateral globus pallidus insults and moderate chronic white matter disease as above. Electronically Signed   By: Logan Bores M.D.   On: 04/26/2018 12:11   Ct Abdomen Pelvis W Contrast  Result Date: 04/19/2018 CLINICAL DATA:  Hemorrhoids without relief for 1 week. Abdominal distension. EXAM:  CT ABDOMEN AND PELVIS WITH CONTRAST TECHNIQUE: Multidetector CT imaging of the abdomen and pelvis was performed using the standard protocol following bolus administration of intravenous contrast. CONTRAST:  60mL OMNIPAQUE IOHEXOL 300 MG/ML  SOLN COMPARISON:  None. FINDINGS: Lower chest: Mild atelectasis of posterior lung bases are noted. The heart size is normal. Hepatobiliary: 5 mm cyst is identified in the right lobe liver. The liver is otherwise unremarkable. The gallbladder is normal. The biliary tree is normal. Pancreas: Unremarkable. No pancreatic ductal dilatation or surrounding inflammatory changes. Spleen: Normal in size without focal abnormality. Adrenals/Urinary Tract: The adrenal glands are normal. Simple cysts is identified in the midpole right kidney. There is a 2 mm nonobstructing stone in lower pole left kidney. No hydronephrosis is identified bilaterally. The bladder is normal. Stomach/Bowel: There is a small  hiatal hernia. The stomach is otherwise normal. There is no small bowel obstruction. No colonic obstruction is identified. The appendix is not seen but no inflammation is noted around cecum. There is generalized edema and swelling in the distal rectal wall. Vascular/Lymphatic: Aortic atherosclerosis. No enlarged abdominal or pelvic lymph nodes. Reproductive: Uterus and bilateral adnexa are unremarkable. Other: None. Musculoskeletal: Degenerative joint changes of the spine are identified. Degenerative anterolisthesis of L5 on S1 is noted. IMPRESSION: Generalized edema and swelling in the distal rectal wall, this can reflect internal hemorrhoids. Liver and right kidney cysts. 2 mm nonobstructing stone in lower pole left kidney. Electronically Signed   By: Abelardo Diesel M.D.   On: 04/19/2018 18:24    Microbiology: No results found for this or any previous visit (from the past 240 hour(s)).   Labs: Basic Metabolic Panel: Recent Labs  Lab 04/19/18 1550 04/20/18 0545 04/21/18 0436 04/26/18 0417  NA 136 139 137 138  K 3.7 3.5 3.7 4.1  CL 102 105 105 102  CO2 24 25 25 28   GLUCOSE 141* 118* 110* 102*  BUN 30* 28* 24* 15  CREATININE 1.48* 1.28* 1.30* 1.06*  CALCIUM 8.9 8.2* 8.2* 8.6*   Liver Function Tests: Recent Labs  Lab 04/19/18 1550  AST 17  ALT 22  ALKPHOS 67  BILITOT 0.6  PROT 6.6  ALBUMIN 3.6   Recent Labs  Lab 04/19/18 1550  LIPASE 34   No results for input(s): AMMONIA in the last 168 hours. CBC: Recent Labs  Lab 04/19/18 1550 04/20/18 0545 04/26/18 0417  WBC 11.0* 10.9* 7.9  HGB 11.6* 10.6* 11.5*  HCT 35.6* 32.4* 35.3*  MCV 102.3* 101.9* 102.3*  PLT 215 207 213   Cardiac Enzymes: No results for input(s): CKTOTAL, CKMB, CKMBINDEX, TROPONINI in the last 168 hours. BNP: BNP (last 3 results) No results for input(s): BNP in the last 8760 hours.  ProBNP (last 3 results) No results for input(s): PROBNP in the last 8760 hours.  CBG: No results for input(s):  GLUCAP in the last 168 hours.     Signed:  Domenic Polite MD.  [facility] 04/26/2018, 12:28 PM

## 2018-04-28 ENCOUNTER — Other Ambulatory Visit: Payer: Self-pay | Admitting: *Deleted

## 2018-04-28 NOTE — Patient Outreach (Signed)
[facility]) Care Management  04/28/2018  Dori Devino August 23, 1935 [id number]    Attended discharge planning meeting at [facility] in [place] with [place] Team Members [place] and [place].  Nursing stated patient had recently admitted and was previously living with daughter, which is currently the desired discharge plan.  Currently working with PT.   Plan to continue to monitor for [facility] CM needs.   [name] RN, BSN [place] Acute Care Coordinator [phone]) Business Mobile [phone]) Toll free office

## 2018-05-01 DIAGNOSIS — M1712 Unilateral primary osteoarthritis, left knee: Secondary | ICD-10-CM | POA: Diagnosis not present

## 2018-05-01 DIAGNOSIS — G459 Transient cerebral ischemic attack, unspecified: Secondary | ICD-10-CM | POA: Diagnosis not present

## 2018-05-01 DIAGNOSIS — K5909 Other constipation: Secondary | ICD-10-CM | POA: Diagnosis not present

## 2018-05-01 DIAGNOSIS — R2689 Other abnormalities of gait and mobility: Secondary | ICD-10-CM | POA: Diagnosis not present

## 2018-05-01 DIAGNOSIS — R1084 Generalized abdominal pain: Secondary | ICD-10-CM | POA: Diagnosis not present

## 2018-05-05 ENCOUNTER — Other Ambulatory Visit: Payer: Self-pay | Admitting: *Deleted

## 2018-05-05 NOTE — Patient Outreach (Signed)
[name]) Care Management  05/05/2018  Carol Page October 26, 1935 [id number]   Attended discharge meeting at [facility] with [facility] UM team members [name] and [name]. Patient admitted to facility 04/26/18 after hospital admission for abdominal pain and constipation. Facility states plan to issue [facility] on 05/06/18 with a last covered day of 05/09/18.  Discharge plan is for patient to return to daughter and son-in-law's home, where she resided before hospital admit. PT states patient continues to need max assist to lift legs into bed.   Attempted to see patient in room x2 but patient not available.  Went to therapy room to introduce [name] services.  Unable to speak freely with patient related to HIPPA guidelines and other patient's in therapy room.  Patient stated she would like [name] services to call her at discharge. Made patient aware I would leave [name] packet at her bedside with contact information.   Patient stated to call her at her daughter's number.   Referral sent for [place] to engage patient for transition of care.  Plan to make UM team member [facility] aware patient accepted CM services.   [name] RN, BSN [place] Acute Care Coordinator [phone]) Business Mobile [phone]) Toll free office

## 2018-05-10 ENCOUNTER — Other Ambulatory Visit: Payer: Self-pay | Admitting: *Deleted

## 2018-05-10 NOTE — Patient Outreach (Signed)
[place] [facility]) Care Management  05/10/2018  Ruble Buttler Jul 04, 1936 [id number]   Referral received from post acute care coordinator as member was recently discharged from SNF.  She was admitted to hospital 10/1-10/8 with proctitis, discharged from SNF on 10/21.  Per chart, she has history of hypertension, seizure disorder, kidney disease, anxiety, depression, and hyperlipidemia.  Noted that daughter [name] is contact person.  Call placed to [facility], no answer.  HIPAA compliant voice message left.  Unsuccessful outreach letter sent, will follow up within the next 4 business days.  Valente David, [place], MSN [place] [phone]

## 2018-05-13 ENCOUNTER — Other Ambulatory Visit: Payer: Self-pay | Admitting: *Deleted

## 2018-05-13 NOTE — Patient Outreach (Signed)
[place] [facility]) Care Management  05/13/2018  Rose Hegner [birth year] [id number]   2nd attempt made to contact member's daughter for transition of care, no answer.  HIPAA compliant voice message left.  Will follow up within the next 4 business days.  Valente David, [place], MSN [place] [phone]

## 2018-05-16 ENCOUNTER — Encounter: Payer: Self-pay | Admitting: Internal Medicine

## 2018-05-16 ENCOUNTER — Ambulatory Visit (INDEPENDENT_AMBULATORY_CARE_PROVIDER_SITE_OTHER): Payer: Medicare Other | Admitting: Internal Medicine

## 2018-05-16 VITALS — BP 146/70 | HR 82 | Temp 98.1°F

## 2018-05-16 DIAGNOSIS — K6289 Other specified diseases of anus and rectum: Secondary | ICD-10-CM

## 2018-05-16 DIAGNOSIS — K5900 Constipation, unspecified: Secondary | ICD-10-CM

## 2018-05-16 NOTE — Patient Instructions (Signed)

## 2018-05-16 NOTE — Progress Notes (Signed)
Subjective:    Patient ID: Carol Page, female    DOB: 08-25-1935, 82 y.o.   MRN: [id number]  HPI  Pt presents to the clinic today for hospital follow up. She went to the ER 10/1 with c/o abdominal pain. She has a history of constipation, taking Lactulose daily. CT abdomen/pelvis severe obstipation and proctitis. GI was consulted, she declined colonoscopy. She was discharged on Mirilax and Colace, Lactulose was d/c'd. She was given Hydrocortisone cream for her hemorrhoids. There was concern that she may have had 2 TIA's while inpatient. She was advised to continue her Atorvastatin and they restarted her Plavix. CT head was unremarkable. MRI showed old infarct but no acute findings. Carotid ultrasound did not show significant carotid stenosis. They stopped her ACEI due to elevated creatinine and placed her on Amlodipine in addition to her HCTZ. She was discharged to [facility] for rehab. Since discharge, she reports she feels weak. Her appetite varies. She has not had any more abdominal pain. She does have some fecal and urinary incontinence, but wears depends daily.   Review of Systems      Past Medical History:  Diagnosis Date  . Allergy   . Arthritis   . Cellulitis of left leg   . Depression   . History of blood transfusion   . Hyperlipidemia   . Hypertension   . Pre-diabetes   . Proctitis   . Skin cancer of nose   . Stroke ([place])   . Ulcer   . Urinary incontinence     Current Outpatient Medications  Medication Sig Dispense Refill  . acetaminophen (TYLENOL) 500 MG tablet Take 1,000 mg by mouth every 8 (eight) hours.     Marland Kitchen amitriptyline (ELAVIL) 25 MG tablet Take 1 tablet (25 mg total) by mouth at bedtime. 90 tablet 0  . amLODipine (NORVASC) 10 MG tablet Take 1 tablet (10 mg total) by mouth daily.  11  . atorvastatin (LIPITOR) 80 MG tablet TAKE 1 TABLET BY MOUTH ONCE DAILY 90 tablet 2  . bisacodyl (DULCOLAX) 5 MG EC tablet Take 2 tablets (10 mg total) by mouth daily as  needed for moderate constipation. 30 tablet 0  . CALCIUM PO Take 1 tablet by mouth daily.    . cholecalciferol (VITAMIN D) 1000 units tablet Take 1,000 Units by mouth daily.    . clopidogrel (PLAVIX) 75 MG tablet TAKE 1 TABLET BY MOUTH IN THE EVENING (Patient not taking: Reported on 04/19/2018) 90 tablet 1  . diltiazem (CARTIA XT) 180 MG 24 hr capsule Take 1 capsule (180 mg total) by mouth daily. 90 capsule 0  . escitalopram (LEXAPRO) 10 MG tablet TAKE 1 TABLET BY MOUTH ONCE DAILY 90 tablet 2  . gabapentin (NEURONTIN) 100 MG capsule Take 1 capsule (100 mg total) by mouth at bedtime.    . gabapentin (NEURONTIN) 300 MG capsule TAKE 1 CAPSULE BY MOUTH IN THE MORNING (Patient taking differently: Take 300 mg by mouth every morning. ) 90 capsule 2  . hydrochlorothiazide (MICROZIDE) 12.5 MG capsule Take 1 capsule (12.5 mg total) by mouth daily. (Patient not taking: Reported on 04/19/2018) 30 capsule 3  . hydrocortisone (ANUSOL-HC) 2.5 % rectal cream Place rectally 2 (two) times daily as needed for hemorrhoids or anal itching. 30 g 0  . loratadine (CLARITIN) 10 MG tablet Take 10 mg by mouth daily.    Marland Kitchen LORazepam (ATIVAN) 1 MG tablet Take 0.5 tablets (0.5 mg total) by mouth daily. 10 tablet 0  . nystatin (MYCOSTATIN/NYSTOP)  powder Apply topically 4 (four) times daily. (Patient taking differently: Apply 1 g topically daily as needed (Rash on Groin and Legs). ) 30 g 2  . PHENYTOIN INFATABS 50 MG tablet TAKE 2 TABLETS BY MOUTH ONCE DAILY 180 tablet 1  . polyethylene glycol (MIRALAX / GLYCOLAX) packet Take 17 g by mouth daily. 14 each 0  . potassium chloride SA (K-DUR,KLOR-CON) 20 MEQ tablet Take 1 tablet (20 mEq total) by mouth daily.    Marland Kitchen senna-docusate (SENOKOT-S) 8.6-50 MG tablet Take 1 tablet by mouth 2 (two) times daily.    . traZODone (DESYREL) 50 MG tablet Take 1 tablet (50 mg total) by mouth at bedtime. 270 tablet 0  . vitamin B-12 (CYANOCOBALAMIN) 1000 MCG tablet Take 1,000 mcg by mouth daily.     No  current facility-administered medications for this visit.     Allergies  Allergen Reactions  . Codeine Nausea And Vomiting  . Ether Nausea And Vomiting    Family History  Problem Relation Age of Onset  . Heart disease Other        Parent  . Cancer Maternal Uncle     Social History   Socioeconomic History  . Marital status: Widowed    Spouse name: Not on file  . Number of children: 4  . Years of education: 57  . Highest education level: Not on file  Occupational History  . Occupation: Retired  Scientific laboratory technician  . Financial resource strain: Not on file  . Food insecurity:    Worry: Not on file    Inability: Not on file  . Transportation needs:    Medical: Not on file    Non-medical: Not on file  Tobacco Use  . Smoking status: Never Smoker  . Smokeless tobacco: Never Used  Substance and Sexual Activity  . Alcohol use: No  . Drug use: No  . Sexual activity: Never  Lifestyle  . Physical activity:    Days per week: Not on file    Minutes per session: Not on file  . Stress: Not on file  Relationships  . Social connections:    Talks on phone: Not on file    Gets together: Not on file    Attends religious service: Not on file    Active member of club or organization: Not on file    Attends meetings of clubs or organizations: Not on file    Relationship status: Not on file  . Intimate partner violence:    Fear of current or ex partner: Not on file    Emotionally abused: Not on file    Physically abused: Not on file    Forced sexual activity: Not on file  Other Topics Concern  . Not on file  Social History Narrative   Regular exercise-no   Caffeine Use-yes     Constitutional: Pt reports fatigue. Denies fever, malaise, headache or abrupt weight changes.  Gastrointestinal: Denies abdominal pain, bloating, constipation, diarrhea or blood in the stool.  Musculoskeletal: Pt reports generalized weakness. Denies decrease in range of motion, muscle pain or joint  swelling.    No other specific complaints in a complete review of systems (except as listed in HPI above).  Objective:   Physical Exam   BP (!) 146/70   Pulse 82   Temp 98.1 F (36.7 C) (Oral)   SpO2 95%  Wt Readings from Last 3 Encounters:  04/20/18 267 lb 3.2 oz (121.2 kg)  05/25/17 256 lb (116.1 kg)  05/12/16 237 lb (  107.5 kg)    General: Appears her stated age, obese, in NAD. Abdomen: Soft and nontender. Hypoactive bowel sounds. No distention or masses noted. Musculoskeletal: In wheelchair, unable to get up on exam table.  Neurological: Alert and oriented.    BMET    Component Value Date/Time   NA 138 04/26/2018 0417   K 4.1 04/26/2018 0417   CL 102 04/26/2018 0417   CO2 28 04/26/2018 0417   GLUCOSE 102 (H) 04/26/2018 0417   BUN 15 04/26/2018 0417   CREATININE 1.06 (H) 04/26/2018 0417   CALCIUM 8.6 (L) 04/26/2018 0417   GFRNONAA 48 (L) 04/26/2018 0417   GFRAA 56 (L) 04/26/2018 0417    Lipid Panel     Component Value Date/Time   CHOL 148 05/25/2017 1111   TRIG 112.0 05/25/2017 1111   HDL 56.00 05/25/2017 1111   CHOLHDL 3 05/25/2017 1111   VLDL 22.4 05/25/2017 1111   LDLCALC 69 05/25/2017 1111    CBC    Component Value Date/Time   WBC 7.9 04/26/2018 0417   RBC 3.45 (L) 04/26/2018 0417   HGB 11.5 (L) 04/26/2018 0417   HCT 35.3 (L) 04/26/2018 0417   PLT 213 04/26/2018 0417   MCV 102.3 (H) 04/26/2018 0417   MCH 33.3 04/26/2018 0417   MCHC 32.6 04/26/2018 0417   RDW 12.6 04/26/2018 0417   LYMPHSABS 0.9 08/22/2015 1410   MONOABS 1.0 08/22/2015 1410   EOSABS 0.1 08/22/2015 1410   BASOSABS 0.0 08/22/2015 1410    Hgb A1C Lab Results  Component Value Date   HGBA1C 5.6 05/12/2016           Assessment & Plan:   ER Follow Up for Obstipation and Proctitis:  Hospital notes, labs and imaging reviewed Continue Mirilax and Colace Encouraged high fiber diet Continue other meds as is  Return precautions discussed Webb Silversmith, NP

## 2018-05-19 ENCOUNTER — Other Ambulatory Visit: Payer: Self-pay | Admitting: *Deleted

## 2018-05-19 NOTE — Patient Outreach (Signed)
[facility]) Care Management  05/19/2018  Ahonesty Woodfin 01-09-1936 [id number]   3rd unsuccessful outreach attempt to contact member's daughter, [name].  HIPAA compliant voice message left.  If no response by 11/5 will close case due to inability to establish contact.  Valente David, [place], MSN [place] [phone]

## 2018-05-25 ENCOUNTER — Other Ambulatory Visit: Payer: Self-pay | Admitting: *Deleted

## 2018-05-25 NOTE — Patient Outreach (Signed)
[facility]) Care Management  05/25/2018  Carol Page [birth year] [id number]   No response after multiple outreach attempts and outreach letter.  Will close case.  Will notify primary MD and member of case closure.  Valente David, [place], MSN [place] [phone]

## 2018-06-02 ENCOUNTER — Other Ambulatory Visit: Payer: Self-pay | Admitting: Internal Medicine

## 2018-06-28 ENCOUNTER — Other Ambulatory Visit: Payer: Self-pay | Admitting: Internal Medicine

## 2018-06-28 NOTE — Telephone Encounter (Signed)
I am confused with some of theses medications... I am assuming pt was in the hospital Dr Domenic Polite shows up as a hospitalist.... Looks like she filled #10 of Ativan as well... Please advise

## 2018-06-29 MED ORDER — CLOPIDOGREL BISULFATE 75 MG PO TABS: 75.0000 mg | ORAL_TABLET | Freq: Every evening | ORAL | 0 refills | Status: DC

## 2018-06-29 MED ORDER — POTASSIUM CHLORIDE CRYS ER 20 MEQ PO TBCR: 20.0000 meq | EXTENDED_RELEASE_TABLET | Freq: Every day | ORAL | 0 refills | Status: DC

## 2018-07-20 ENCOUNTER — Other Ambulatory Visit: Payer: Self-pay | Admitting: Internal Medicine

## 2018-08-01 ENCOUNTER — Encounter: Payer: Medicare Other | Admitting: Internal Medicine

## 2018-09-07 ENCOUNTER — Other Ambulatory Visit: Payer: Self-pay | Admitting: Internal Medicine

## 2018-09-14 ENCOUNTER — Encounter: Payer: Medicare Other | Admitting: Internal Medicine

## 2018-09-22 ENCOUNTER — Other Ambulatory Visit: Payer: Self-pay | Admitting: Internal Medicine

## 2018-09-28 ENCOUNTER — Other Ambulatory Visit: Payer: Self-pay | Admitting: Internal Medicine

## 2018-10-06 ENCOUNTER — Other Ambulatory Visit: Payer: Self-pay

## 2018-10-06 MED ORDER — PHENYTOIN 50 MG PO CHEW: 100.0000 mg | CHEWABLE_TABLET | Freq: Every day | ORAL | 0 refills | Status: DC

## 2018-10-06 NOTE — Telephone Encounter (Signed)
Last filled 01/05/18 with refill

## 2018-10-11 ENCOUNTER — Other Ambulatory Visit: Payer: Self-pay | Admitting: Internal Medicine

## 2018-10-18 ENCOUNTER — Other Ambulatory Visit: Payer: Self-pay | Admitting: Internal Medicine

## 2018-10-19 ENCOUNTER — Telehealth: Payer: Self-pay

## 2018-10-19 MED ORDER — GABAPENTIN 300 MG PO CAPS: 300.0000 mg | ORAL_CAPSULE | Freq: Two times a day (BID) | ORAL | 0 refills | Status: DC

## 2018-10-19 NOTE — Telephone Encounter (Signed)
RX for Gabapentin 300 mg BID sent to pharmacy

## 2018-10-19 NOTE — Telephone Encounter (Signed)
Carol Page When my mother was in the hospital and then rehab to [facility], her meds got changed by Dr Sharlett Iles. We are suppose to review on her exam but not sure when that will be.They basically ordered her lorazepam as as needed. She has not needed it, so I didn't get it filled.Also her gabapentin was switched by the MD to 300 mg bid. She is doing fine with this dose but you ordered 300mg  qam which will make her #'s short by 15 pills.Could you please change the order to bid so we don't run out. Thank you so much.   Lorazepam last filled 06/29/18

## 2018-10-25 ENCOUNTER — Encounter: Payer: Medicare Other | Admitting: Internal Medicine

## 2018-10-29 ENCOUNTER — Other Ambulatory Visit: Payer: Self-pay | Admitting: Internal Medicine

## 2018-11-04 ENCOUNTER — Other Ambulatory Visit: Payer: Self-pay | Admitting: Internal Medicine

## 2018-12-02 ENCOUNTER — Other Ambulatory Visit: Payer: Self-pay | Admitting: Internal Medicine

## 2018-12-27 ENCOUNTER — Other Ambulatory Visit: Payer: Self-pay | Admitting: Internal Medicine

## 2019-01-01 ENCOUNTER — Other Ambulatory Visit: Payer: Self-pay | Admitting: Internal Medicine

## 2019-01-04 ENCOUNTER — Other Ambulatory Visit: Payer: Self-pay | Admitting: Internal Medicine

## 2019-01-12 ENCOUNTER — Other Ambulatory Visit: Payer: Self-pay

## 2019-01-12 NOTE — Telephone Encounter (Signed)
Last filled 10/06/2018... please advise

## 2019-01-13 MED ORDER — PHENYTOIN 50 MG PO CHEW: 100.0000 mg | CHEWABLE_TABLET | Freq: Every day | ORAL | 0 refills | Status: DC

## 2019-01-13 NOTE — Addendum Note (Signed)
Addended by: Lurlean Nanny on: 01/13/2019 01:42 PM   Modules accepted: Orders

## 2019-01-17 ENCOUNTER — Other Ambulatory Visit: Payer: Self-pay | Admitting: Internal Medicine

## 2019-01-24 ENCOUNTER — Other Ambulatory Visit: Payer: Self-pay | Admitting: Internal Medicine

## 2019-02-02 ENCOUNTER — Other Ambulatory Visit: Payer: Self-pay | Admitting: Internal Medicine

## 2019-02-02 NOTE — Telephone Encounter (Signed)
Pt refilled on Lisinopril was 01/04/2019 & Trazodone on 11/05/2018. Please advise, thanks

## 2019-02-08 ENCOUNTER — Other Ambulatory Visit: Payer: Self-pay | Admitting: Internal Medicine

## 2019-02-13 NOTE — Telephone Encounter (Signed)
Last filled 01/13/2019... I spoke to daughter and she stated she will call to schedule AWV... please advise

## 2019-02-20 ENCOUNTER — Other Ambulatory Visit: Payer: Self-pay | Admitting: Internal Medicine

## 2019-02-20 ENCOUNTER — Other Ambulatory Visit: Payer: Self-pay

## 2019-03-06 ENCOUNTER — Other Ambulatory Visit: Payer: Self-pay | Admitting: Internal Medicine

## 2019-03-12 ENCOUNTER — Other Ambulatory Visit: Payer: Self-pay | Admitting: Internal Medicine

## 2019-03-14 NOTE — Telephone Encounter (Signed)
Last filled 02/13/2019, pt has appt in September

## 2019-03-19 ENCOUNTER — Other Ambulatory Visit: Payer: Self-pay | Admitting: Internal Medicine

## 2019-03-24 ENCOUNTER — Other Ambulatory Visit: Payer: Self-pay

## 2019-03-24 ENCOUNTER — Ambulatory Visit (INDEPENDENT_AMBULATORY_CARE_PROVIDER_SITE_OTHER): Payer: Medicare Other | Admitting: Internal Medicine

## 2019-03-24 ENCOUNTER — Encounter: Payer: Self-pay | Admitting: Internal Medicine

## 2019-03-24 VITALS — BP 144/70 | HR 83 | Temp 98.3°F | Wt 246.0 lb

## 2019-03-24 DIAGNOSIS — N393 Stress incontinence (female) (male): Secondary | ICD-10-CM | POA: Diagnosis not present

## 2019-03-24 DIAGNOSIS — G8929 Other chronic pain: Secondary | ICD-10-CM | POA: Diagnosis not present

## 2019-03-24 DIAGNOSIS — I10 Essential (primary) hypertension: Secondary | ICD-10-CM | POA: Diagnosis not present

## 2019-03-24 DIAGNOSIS — Z23 Encounter for immunization: Secondary | ICD-10-CM | POA: Diagnosis not present

## 2019-03-24 DIAGNOSIS — G40909 Epilepsy, unspecified, not intractable, without status epilepticus: Secondary | ICD-10-CM | POA: Diagnosis not present

## 2019-03-24 DIAGNOSIS — R7303 Prediabetes: Secondary | ICD-10-CM

## 2019-03-24 DIAGNOSIS — Z8673 Personal history of transient ischemic attack (TIA), and cerebral infarction without residual deficits: Secondary | ICD-10-CM | POA: Diagnosis not present

## 2019-03-24 DIAGNOSIS — M79605 Pain in left leg: Secondary | ICD-10-CM | POA: Diagnosis not present

## 2019-03-24 DIAGNOSIS — E559 Vitamin D deficiency, unspecified: Secondary | ICD-10-CM

## 2019-03-24 DIAGNOSIS — F32A Depression, unspecified: Secondary | ICD-10-CM

## 2019-03-24 DIAGNOSIS — Z0000 Encounter for general adult medical examination without abnormal findings: Secondary | ICD-10-CM

## 2019-03-24 DIAGNOSIS — F419 Anxiety disorder, unspecified: Secondary | ICD-10-CM | POA: Diagnosis not present

## 2019-03-24 DIAGNOSIS — F329 Major depressive disorder, single episode, unspecified: Secondary | ICD-10-CM | POA: Diagnosis not present

## 2019-03-24 DIAGNOSIS — E782 Mixed hyperlipidemia: Secondary | ICD-10-CM | POA: Diagnosis not present

## 2019-03-24 NOTE — Progress Notes (Signed)
HPI:  Pt presents to the clinic today for her subsequent annual Medicare Wellness Exam.  Anxiety and Depression: Persistent anxiety, secondary to health-related issues.  She is taking Escitalopram as prescribed.  No longer uses Lorazepam. She uses Trazadone at night to sleep.  CSA and UDS are due.  HTN: Her BP today is 144/70.  She is taking Diltiazem, Lisinopril and HCTZ.  ECG from/2017 reviewed.  HLD: Her last LDL was 69, triglycerides 112, 05/2017.  She denies myalgias on Atorvastatin.  She rarely eats fried foods.  Prediabetes: Her last A1c was 5.6, 04/2016.  She is not on any oral hypoglycemic agents.  She does not check her sugars.  She checks her feet routinely.   Seizures: No recent seizures on Dilantin.  History of stroke: She is taking Atorvastatin and Plavix as prescribed.  She has residual weakness and needs help with all ADLs.  Care is provided by her daughter.  OAB: Reports urinary frequency and incontinence.  She had confusion with anticholinergics so does not take anything for this.  Chronic Left Leg Pain.  Managed Gabapentin and Amitriptyline.  Past Medical History:  Diagnosis Date  . Allergy   . Arthritis   . Cellulitis of left leg   . Depression   . History of blood transfusion   . Hyperlipidemia   . Hypertension   . Pre-diabetes   . Proctitis   . Skin cancer of nose   . Stroke ([place])   . Ulcer   . Urinary incontinence     Current Outpatient Medications  Medication Sig Dispense Refill  . acetaminophen (TYLENOL) 500 MG tablet Take 1,000 mg by mouth every 8 (eight) hours.     Marland Kitchen amitriptyline (ELAVIL) 25 MG tablet Take 1 tablet (25 mg total) by mouth at bedtime. MUST SCHEDULE PHYSICAL 90 tablet 0  . amLODipine (NORVASC) 10 MG tablet Take 1 tablet (10 mg total) by mouth daily.  11  . atorvastatin (LIPITOR) 80 MG tablet TAKE 1 TABLET BY MOUTH ONCE DAILY(NEEDS OFFICE VISIT) 90 tablet 0  . bisacodyl (DULCOLAX) 5 MG EC tablet Take 2 tablets (10 mg total) by mouth  daily as needed for moderate constipation. 30 tablet 0  . CALCIUM PO Take 1 tablet by mouth daily.    Marland Kitchen CARTIA XT 180 MG 24 hr capsule Take 1 capsule by mouth once daily 90 capsule 0  . cholecalciferol (VITAMIN D) 1000 units tablet Take 1,000 Units by mouth daily.    . clopidogrel (PLAVIX) 75 MG tablet Take 1 tablet by mouth in the evening 90 tablet 0  . escitalopram (LEXAPRO) 10 MG tablet Take 1 tablet by mouth once daily 90 tablet 0  . gabapentin (NEURONTIN) 100 MG capsule Take 1 capsule (100 mg total) by mouth at bedtime.    . gabapentin (NEURONTIN) 300 MG capsule Take 1 capsule (300 mg total) by mouth 2 (two) times daily. MUST SCHEDULE PHYSICAL 180 capsule 0  . hydrochlorothiazide (MICROZIDE) 12.5 MG capsule Take 1 capsule (12.5 mg total) by mouth daily. 30 capsule 3  . hydrochlorothiazide (MICROZIDE) 12.5 MG capsule Take 1 capsule by mouth once daily 90 capsule 0  . hydrocortisone (ANUSOL-HC) 2.5 % rectal cream Place rectally 2 (two) times daily as needed for hemorrhoids or anal itching. 30 g 0  . lisinopril (ZESTRIL) 10 MG tablet TAKE 1 TABLET BY MOUTH ONCE DAILY(NEEDS OFFICE VISIT) 30 tablet 0  . loratadine (CLARITIN) 10 MG tablet Take 10 mg by mouth daily.    Marland Kitchen LORazepam (ATIVAN) 1  MG tablet Take 0.5 tablets (0.5 mg total) by mouth daily. 10 tablet 0  . LORazepam (ATIVAN) 1 MG tablet TAKE 1 TABLET BY MOUTH ONCE DAILY AS NEEDED 30 tablet 0  . NYSTATIN powder APPLY  POWDER TOPICALLY TO AFFECTED AREA 4 TIMES DAILY 45 g 2  . phenytoin (DILANTIN) 50 MG tablet CHEW AND SWALLOW 2 TABLETS BY MOUTH ONCE DAILY 60 tablet 0  . polyethylene glycol (MIRALAX / GLYCOLAX) packet Take 17 g by mouth daily. 14 each 0  . potassium chloride SA (K-DUR) 20 MEQ tablet Take 1 tablet by mouth once daily 90 tablet 0  . senna-docusate (SENOKOT-S) 8.6-50 MG tablet Take 1 tablet by mouth 2 (two) times daily.    . traZODone (DESYREL) 50 MG tablet TAKE 3 TABLETS BY MOUTH AT BEDTIME 270 tablet 0  . vitamin B-12  (CYANOCOBALAMIN) 1000 MCG tablet Take 1,000 mcg by mouth daily.     No current facility-administered medications for this visit.     Allergies  Allergen Reactions  . Codeine Nausea And Vomiting  . Ether Nausea And Vomiting    Family History  Problem Relation Age of Onset  . Heart disease Other        Parent  . Cancer Maternal Uncle     Social History   Socioeconomic History  . Marital status: Widowed    Spouse name: Not on file  . Number of children: 4  . Years of education: 72  . Highest education level: Not on file  Occupational History  . Occupation: Retired  Scientific laboratory technician  . Financial resource strain: Not on file  . Food insecurity    Worry: Not on file    Inability: Not on file  . Transportation needs    Medical: Not on file    Non-medical: Not on file  Tobacco Use  . Smoking status: Never Smoker  . Smokeless tobacco: Never Used  Substance and Sexual Activity  . Alcohol use: No  . Drug use: No  . Sexual activity: Never  Lifestyle  . Physical activity    Days per week: Not on file    Minutes per session: Not on file  . Stress: Not on file  Relationships  . Social Herbalist on phone: Not on file    Gets together: Not on file    Attends religious service: Not on file    Active member of club or organization: Not on file    Attends meetings of clubs or organizations: Not on file    Relationship status: Not on file  . Intimate partner violence    Fear of current or ex partner: Not on file    Emotionally abused: Not on file    Physically abused: Not on file    Forced sexual activity: Not on file  Other Topics Concern  . Not on file  Social History Narrative   Regular exercise-no   Caffeine Use-yes    Hospitiliaztions: None  Health Maintenance:    Flu: 05/2017  Tetanus: 04/2016  Pneumovax: 05/2017  Prevnar: 03/2014  Zostavax: Never  Shingrix: Never  Mammogram: No longer screening  Pap Smear: No longer screening  Bone Density:  Declines  Colon Screening: Declined  Eye Doctor: Annually  Dental Exam: Annually   Providers:   PCP: Webb Silversmith, NP  I have personally reviewed and have noted:  1. The patient's medical and social history 2. Their use of alcohol, tobacco or illicit drugs 3. Their current medications and supplements  4. The patient's functional ability including ADL's, fall risks, home safety risks and hearing or visual impairment. 5. Diet and physical activities 6. Evidence for depression or mood disorder  Subjective:   Review of Systems:   Constitutional: Patient reports fatigue.  Denies fever, malaise, headache or abrupt weight changes.  HEENT: Denies eye pain, eye redness, ear pain, ringing in the ears, wax buildup, runny nose, nasal congestion, bloody nose, or sore throat. Respiratory: Denies difficulty breathing, shortness of breath, cough or sputum production.   Cardiovascular: Denies chest pain, chest tightness, palpitations or swelling in the hands or feet.  Gastrointestinal: Pt reports bowel incontinence. Denies abdominal pain, bloating, constipation, diarrhea or blood in the stool.  GU: Patient reports urinary frequency and incontinence.  Denies urgency,pain with urination, burning sensation, blood in urine, odor or discharge. Musculoskeletal: Patient reports chronic left leg pain, difficulty with gait.  Denies muscle pain or joint  swelling.  Skin: Denies redness, rashes, lesions or ulcercations.  Neurological: Patient reports difficulty with memory, problems with balance and coordination.  Denies dizziness, difficulty with speech.  Psych: Patient has a history of anxiety and depression.  Denies SI/HI.  No other specific complaints in a complete review of systems (except as listed in HPI above).  Objective:  PE:   Wt Readings from Last 3 Encounters:  04/20/18 267 lb 3.2 oz (121.2 kg)  05/25/17 256 lb (116.1 kg)  05/12/16 237 lb (107.5 kg)    General: Appears her stated age,  obese in NAD. Skin: Warm, dry and intact. No ulcerations noted. HEENT: Head: normal shape and size; Eyes: sclera white, no icterus, conjunctiva pink, PERRLA and EOMs intact; Ears: Tm's gray and intact, normal light reflex;  Neck: Neck supple, trachea midline. No masses, lumps or thyromegaly present.  Cardiovascular: Normal rate and rhythm. S1,S2 noted.  No murmur, rubs or gallops noted. No JVD or BLE edema. No carotid bruits noted. Pulmonary/Chest: Normal effort and positive vesicular breath sounds. No respiratory distress. No wheezes, rales or ronchi noted.  Abdomen: Soft and nontender. Normal bowel sounds. No distention or masses noted. Liver, spleen and kidneys non palpable. Musculoskeletal:  Strength 4 /5 BUE/BLE. No signs of joint swelling.  Neurological: Alert and oriented.  Psychiatric: Mood and affect mildly flat.  BMET    Component Value Date/Time   NA 138 04/26/2018 0417   K 4.1 04/26/2018 0417   CL 102 04/26/2018 0417   CO2 28 04/26/2018 0417   GLUCOSE 102 (H) 04/26/2018 0417   BUN 15 04/26/2018 0417   CREATININE 1.06 (H) 04/26/2018 0417   CALCIUM 8.6 (L) 04/26/2018 0417   GFRNONAA 48 (L) 04/26/2018 0417   GFRAA 56 (L) 04/26/2018 0417    Lipid Panel     Component Value Date/Time   CHOL 148 05/25/2017 1111   TRIG 112.0 05/25/2017 1111   HDL 56.00 05/25/2017 1111   CHOLHDL 3 05/25/2017 1111   VLDL 22.4 05/25/2017 1111   LDLCALC 69 05/25/2017 1111    CBC    Component Value Date/Time   WBC 7.9 04/26/2018 0417   RBC 3.45 (L) 04/26/2018 0417   HGB 11.5 (L) 04/26/2018 0417   HCT 35.3 (L) 04/26/2018 0417   PLT 213 04/26/2018 0417   MCV 102.3 (H) 04/26/2018 0417   MCH 33.3 04/26/2018 0417   MCHC 32.6 04/26/2018 0417   RDW 12.6 04/26/2018 0417   LYMPHSABS 0.9 08/22/2015 1410   MONOABS 1.0 08/22/2015 1410   EOSABS 0.1 08/22/2015 1410   BASOSABS 0.0 08/22/2015 1410  Hgb A1C Lab Results  Component Value Date   HGBA1C 5.6 05/12/2016      Assessment and  Plan:   Medicare Annual Wellness Visit:  Diet: She does eat lean meat. She consumes fruits and veggies daily. She does not eat fried foods. She drinks mostly Ginger Ale and coffee Physical activity: Sedentary Depression/mood screen: Negative, PHQ 9 score of 2 Hearing: Intact to whispered voice Visual acuity: Grossly normal, performs annual eye exam  ADLs: Needs assistance. Fall risk: High Home safety: Good Cognitive evaluation: Intact to orientation, naming, recall and repetition EOL planning: Adv directives, DNR, I agree  Preventative Medicine: Flu shot today.  Tetanus, Pneumovax and Prevnar up-to-date.  She will get Shingrix at the pharmacy.  She declines mammogram, Pap smear, bone density colon cancer screening.  Encouraged her to consume a balanced diet and exercise regimen.  Advised her to see an eye doctor and dentist annually.  Will check CBC, C met, lipid, A1c and vitamin D today.  Due dates of screening exams given to patient as part of her AVS.   Next appointment: 6 months, follow-up chronic conditions.   Webb Silversmith, NP

## 2019-03-24 NOTE — Patient Instructions (Signed)

## 2019-03-24 NOTE — Assessment & Plan Note (Signed)
Persistent weakness Encouraged strengthening exercise She declines PT Continue atorvastatin and Plavix

## 2019-03-24 NOTE — Assessment & Plan Note (Signed)
Continue Eitalopram, wean not indicated Support offered today

## 2019-03-24 NOTE — Assessment & Plan Note (Signed)
Persistent off meds We will monitor

## 2019-03-24 NOTE — Assessment & Plan Note (Signed)
C met and lipid profile today Reinforced low saturated fat diet Continue atorvastatin for now

## 2019-03-24 NOTE — Assessment & Plan Note (Signed)
Continue Dilantin C met today

## 2019-03-24 NOTE — Assessment & Plan Note (Signed)
A1c today No microalbumin secondary to ACE therapy Encouraged her to consume a low-carb diet and exercise for weight loss Exam today Encouraged yearly eye exam Flu shot given today Pneumonia vaccines UTD

## 2019-03-24 NOTE — Assessment & Plan Note (Signed)
Will stop Amitriptyline secondary sedation Consider weaning gGabapentin next visit

## 2019-03-24 NOTE — Assessment & Plan Note (Signed)
Continue Diltiazem, Lisinopril and HCTZ C met today Reinforced DASH diet and exercise for weight loss.

## 2019-03-25 ENCOUNTER — Other Ambulatory Visit: Payer: Self-pay | Admitting: Internal Medicine

## 2019-03-25 LAB — CBC
HCT: 34.5 % — ABNORMAL LOW (ref 35.0–45.0)
Hemoglobin: 11.4 g/dL — ABNORMAL LOW (ref 11.7–15.5)
MCH: 32.2 pg (ref 27.0–33.0)
MCHC: 33 g/dL (ref 32.0–36.0)
MCV: 97.5 fL (ref 80.0–100.0)
MPV: 9.9 fL (ref 7.5–12.5)
Platelets: 208 10*3/uL (ref 140–400)
RBC: 3.54 10*6/uL — ABNORMAL LOW (ref 3.80–5.10)
RDW: 12.4 % (ref 11.0–15.0)
WBC: 8.9 10*3/uL (ref 3.8–10.8)

## 2019-03-25 LAB — COMPREHENSIVE METABOLIC PANEL
AG Ratio: 1.4 (calc) (ref 1.0–2.5)
ALT: 15 U/L (ref 6–29)
AST: 15 U/L (ref 10–35)
Albumin: 4 g/dL (ref 3.6–5.1)
Alkaline phosphatase (APISO): 66 U/L (ref 37–153)
BUN/Creatinine Ratio: 22 (calc) (ref 6–22)
BUN: 30 mg/dL — ABNORMAL HIGH (ref 7–25)
CO2: 25 mmol/L (ref 20–32)
Calcium: 9.3 mg/dL (ref 8.6–10.4)
Chloride: 104 mmol/L (ref 98–110)
Creat: 1.39 mg/dL — ABNORMAL HIGH (ref 0.60–0.88)
Globulin: 2.8 g/dL (calc) (ref 1.9–3.7)
Glucose, Bld: 95 mg/dL (ref 65–99)
Potassium: 5 mmol/L (ref 3.5–5.3)
Sodium: 137 mmol/L (ref 135–146)
Total Bilirubin: 0.3 mg/dL (ref 0.2–1.2)
Total Protein: 6.8 g/dL (ref 6.1–8.1)

## 2019-03-25 LAB — LIPID PANEL
Cholesterol: 142 mg/dL (ref <200)
HDL: 40 mg/dL — ABNORMAL LOW (ref >=50)
LDL Cholesterol (Calc): 75 mg/dL (calc)
Non-HDL Cholesterol (Calc): 102 mg/dL (calc) (ref <130)
Total CHOL/HDL Ratio: 3.6 (calc) (ref <5.0)
Triglycerides: 172 mg/dL — ABNORMAL HIGH (ref <150)

## 2019-03-25 LAB — HEMOGLOBIN A1C
Hgb A1c MFr Bld: 5.4 % of total Hgb (ref <5.7)
Mean Plasma Glucose: 108 (calc)
eAG (mmol/L): 6 (calc)

## 2019-03-25 LAB — VITAMIN D 25 HYDROXY (VIT D DEFICIENCY, FRACTURES): Vit D, 25-Hydroxy: 25 ng/mL — ABNORMAL LOW (ref 30–100)

## 2019-03-26 ENCOUNTER — Other Ambulatory Visit: Payer: Self-pay | Admitting: Internal Medicine

## 2019-03-28 NOTE — Addendum Note (Signed)
Addended by: Lurlean Nanny on: 03/28/2019 08:15 AM   Modules accepted: Orders

## 2019-04-02 ENCOUNTER — Other Ambulatory Visit: Payer: Self-pay | Admitting: Internal Medicine

## 2019-04-09 ENCOUNTER — Other Ambulatory Visit: Payer: Self-pay | Admitting: Internal Medicine

## 2019-04-09 NOTE — Telephone Encounter (Signed)
Last filled 03/14/2019... please advise

## 2019-04-14 ENCOUNTER — Other Ambulatory Visit: Payer: Self-pay

## 2019-04-16 ENCOUNTER — Other Ambulatory Visit: Payer: Self-pay | Admitting: Internal Medicine

## 2019-04-29 ENCOUNTER — Other Ambulatory Visit: Payer: Self-pay | Admitting: Internal Medicine

## 2019-05-14 ENCOUNTER — Other Ambulatory Visit: Payer: Self-pay | Admitting: Internal Medicine

## 2019-06-08 DIAGNOSIS — D485 Neoplasm of uncertain behavior of skin: Secondary | ICD-10-CM | POA: Diagnosis not present

## 2019-06-08 DIAGNOSIS — L249 Irritant contact dermatitis, unspecified cause: Secondary | ICD-10-CM | POA: Diagnosis not present

## 2019-06-08 DIAGNOSIS — C44519 Basal cell carcinoma of skin of other part of trunk: Secondary | ICD-10-CM | POA: Diagnosis not present

## 2019-08-01 DIAGNOSIS — L905 Scar conditions and fibrosis of skin: Secondary | ICD-10-CM | POA: Diagnosis not present

## 2019-08-01 DIAGNOSIS — C44519 Basal cell carcinoma of skin of other part of trunk: Secondary | ICD-10-CM | POA: Diagnosis not present

## 2019-08-18 ENCOUNTER — Ambulatory Visit: Payer: Medicare Other

## 2019-09-15 ENCOUNTER — Ambulatory Visit: Payer: Medicare Other

## 2019-09-17 ENCOUNTER — Ambulatory Visit: Payer: Medicare Other | Attending: Internal Medicine

## 2019-09-17 DIAGNOSIS — Z23 Encounter for immunization: Secondary | ICD-10-CM | POA: Insufficient documentation

## 2019-09-17 NOTE — Progress Notes (Signed)
   Covid-19 Vaccination Clinic  Name:  Selene Peltzer    MRN: [id number] DOB: [birth year]  09/17/2019  Ms. Rosamond was observed post Covid-19 immunization for 15 minutes without incidence. She was provided with Vaccine Information Sheet and instruction to access the V-Safe system.   Ms. Zeigler was instructed to call 911 with any severe reactions post vaccine: Marland Kitchen Difficulty breathing  . Swelling of your face and throat  . A fast heartbeat  . A bad rash all over your body  . Dizziness and weakness    Immunizations Administered    Name Date Dose VIS Date Route   Pfizer COVID-19 Vaccine 09/17/2019 10:48 AM 0.3 mL 06/30/2019 Intramuscular   Manufacturer: [place]   Lot: [id number]   [place]: [id number]

## 2019-10-07 ENCOUNTER — Other Ambulatory Visit: Payer: Self-pay | Admitting: Internal Medicine

## 2019-10-09 NOTE — Telephone Encounter (Signed)
Last filled 04/11/2019 with 1 refill.. please advise

## 2019-10-11 ENCOUNTER — Ambulatory Visit: Payer: Medicare Other | Attending: Internal Medicine

## 2019-10-11 DIAGNOSIS — Z23 Encounter for immunization: Secondary | ICD-10-CM

## 2019-10-11 NOTE — Progress Notes (Signed)
   Covid-19 Vaccination Clinic  Name:  [name]    MRN: [id number] DOB: [birth year]  10/11/2019  [name] was observed post Covid-19 immunization for 15 minutes without incident. She was provided with Vaccine Information Sheet and instruction to access the V-Safe system.   [name] was instructed to call 911 with any severe reactions post vaccine: Marland Kitchen Difficulty breathing  . Swelling of face and throat  . A fast heartbeat  . A bad rash all over body  . Dizziness and weakness   Immunizations Administered    Name Date Dose VIS Date Route   Pfizer COVID-19 Vaccine 10/11/2019  2:32 PM 0.3 mL 06/30/2019 Intramuscular   Manufacturer: [place]   Lot: [id number]   [place]: [id number]

## 2019-11-16 IMAGING — CT CT ABD-PELV W/ CM
2 of 5 series · 16 of 46 positions shown, 18 images · IV contrast (omnipaque)
Comparison: None.

CLINICAL DATA: Hemorrhoids without relief for 1 week. Abdominal
distension.

EXAM:
CT ABDOMEN AND PELVIS WITH CONTRAST
TECHNIQUE: Multidetector CT imaging of the abdomen and pelvis was performed
using the standard protocol following bolus administration of
intravenous contrast.
CONTRAST:  80mL OMNIPAQUE IOHEXOL 300 MG/ML  SOLN

[Series 3: a/p w/ 5mm · axial · 0.98mm/px · z∈[+461,+956]mm · 13 of 113 slices shown, 15 images]
[im 7/113  soft-tissue]
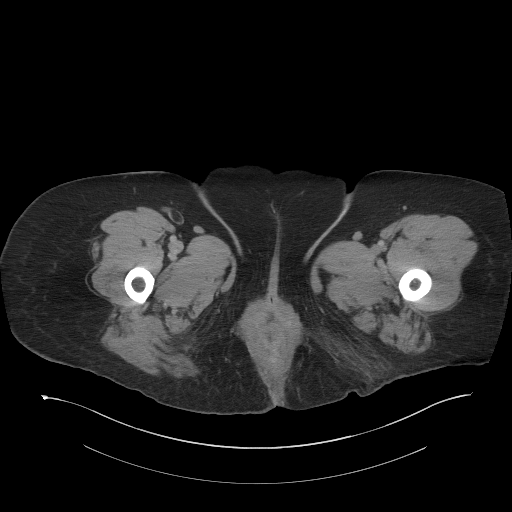
[im 7/113  bone]
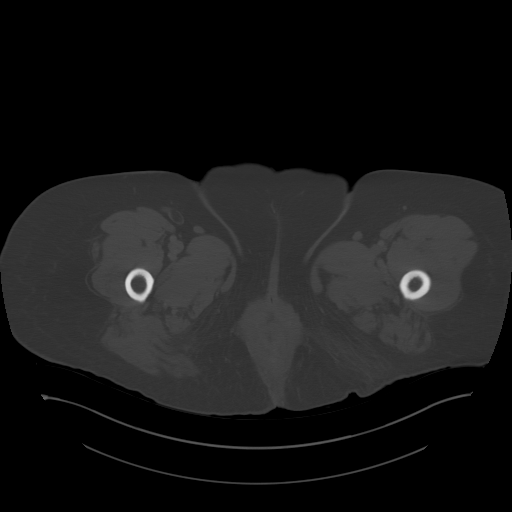
[im 14/113  soft-tissue]
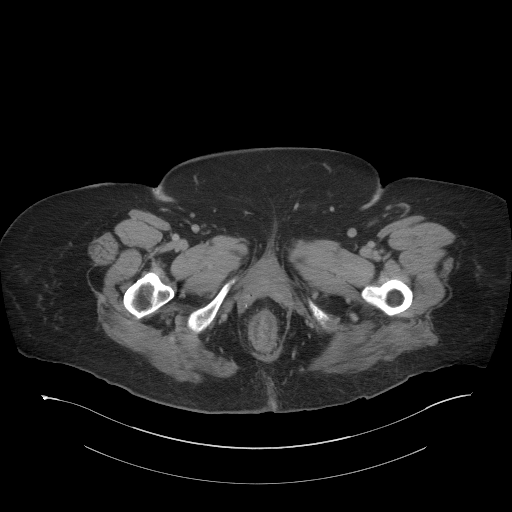
[im 27/113  soft-tissue]
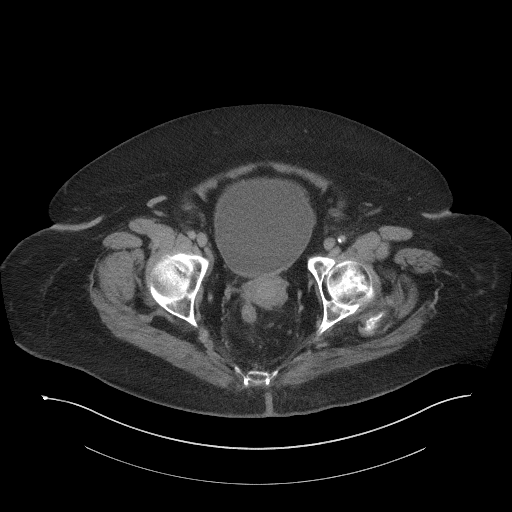
[im 33/113  soft-tissue]
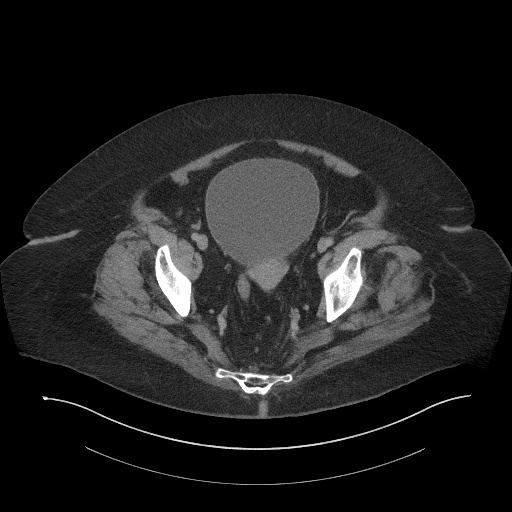
[im 40/113  soft-tissue]
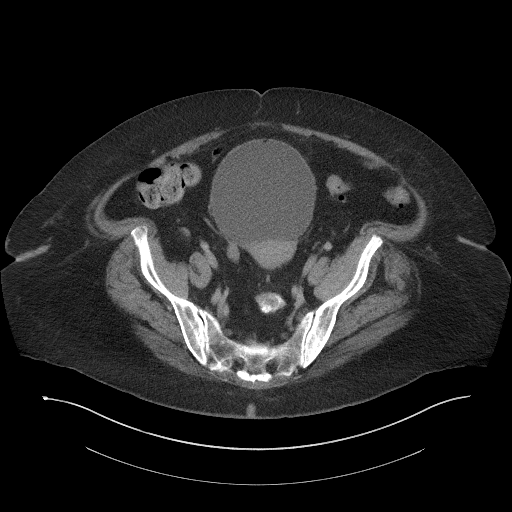
[im 47/113  soft-tissue]
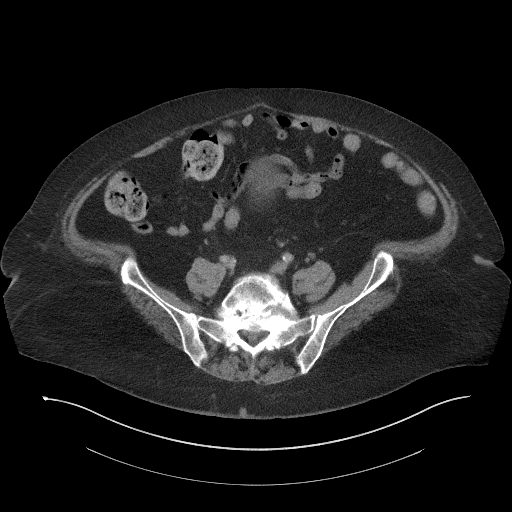
[im 60/113  soft-tissue]
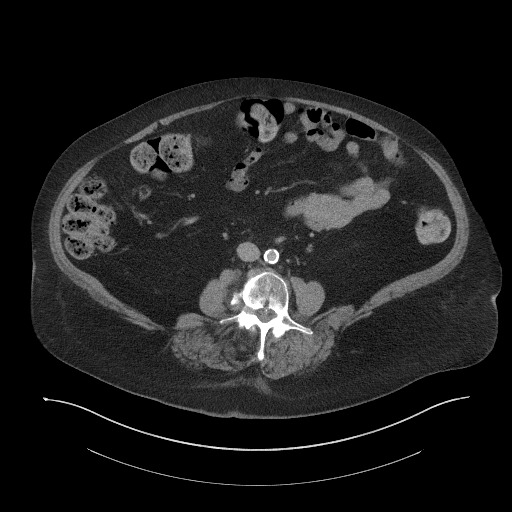
[im 66/113  soft-tissue]
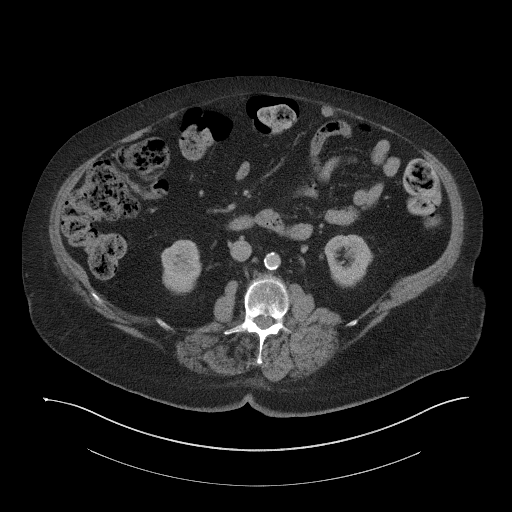
[im 73/113  soft-tissue]
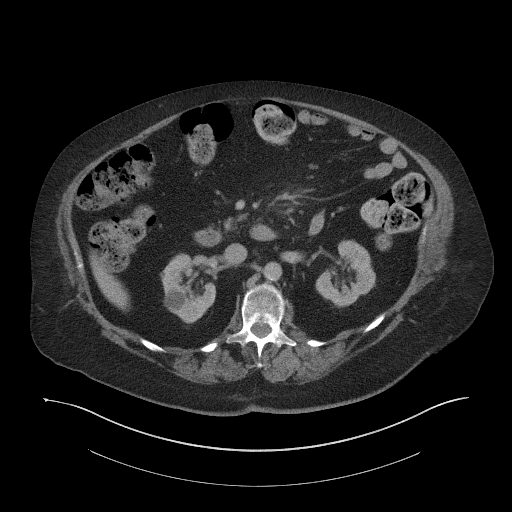
[im 73/113  bone]
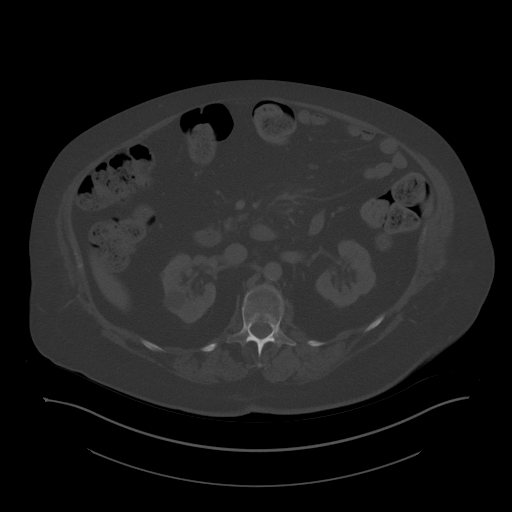
[im 80/113  soft-tissue]
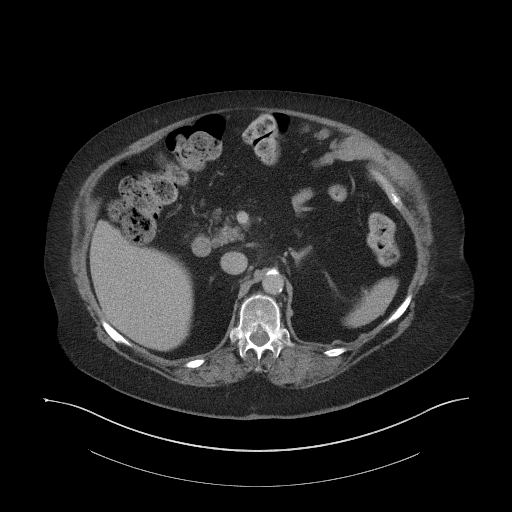
[im 86/113  soft-tissue]
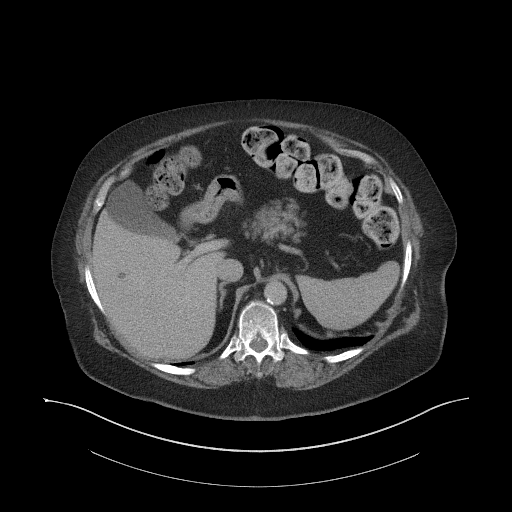
[im 99/113  soft-tissue]
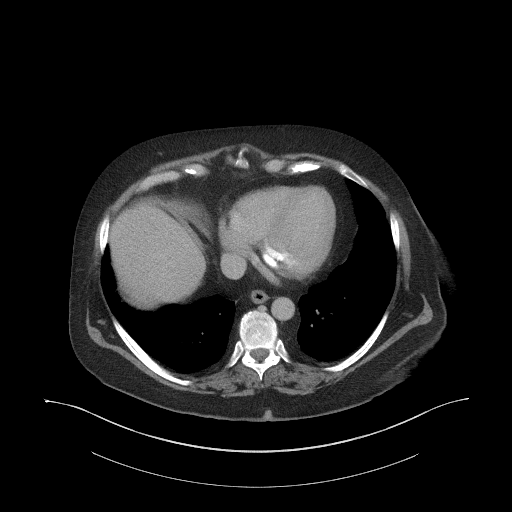
[im 106/113  soft-tissue]
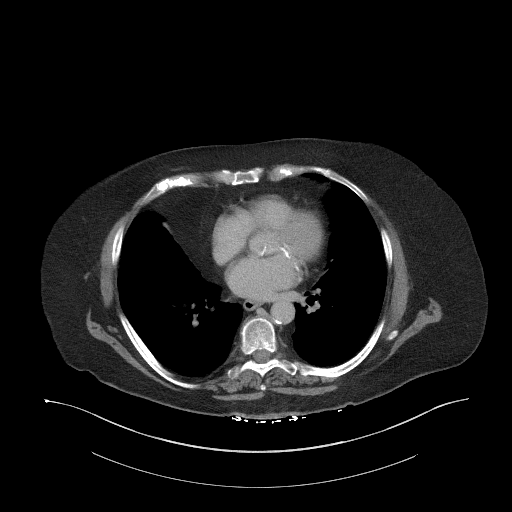

[Series 6: a/p w/ cor · coronal · 1.04mm/px · 3 of 163 slices shown]
[im 55/163  soft-tissue]
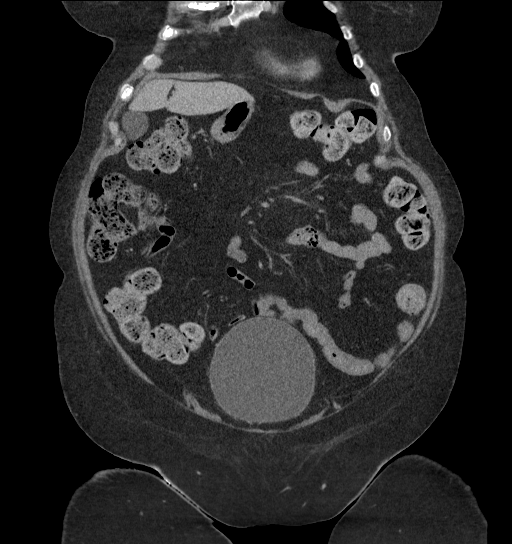
[im 73/163  soft-tissue]
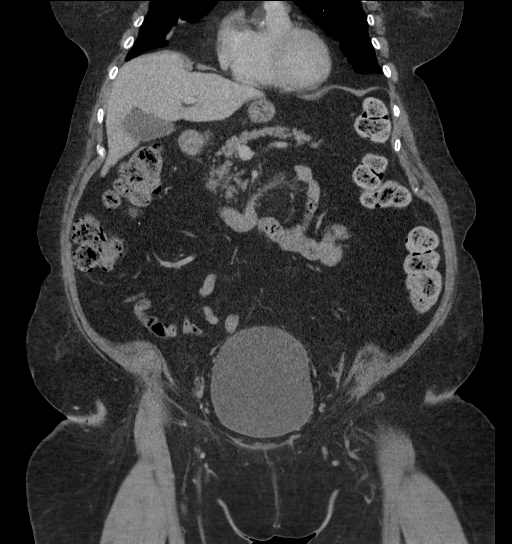
[im 91/163  soft-tissue]
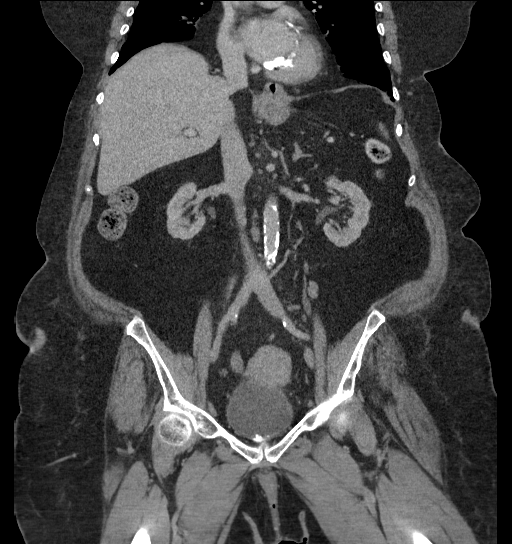

[16 of 46 positions shown; findings below may reference images not displayed]

FINDINGS: Lower chest: Mild atelectasis of posterior lung bases are noted. The
heart size is normal.

Hepatobiliary: 5 mm cyst is identified in the right lobe liver. The
liver is otherwise unremarkable. The gallbladder is normal. The
biliary tree is normal.

Pancreas: Unremarkable. No pancreatic ductal dilatation or
surrounding inflammatory changes.

Spleen: Normal in size without focal abnormality.

Adrenals/Urinary Tract: The adrenal glands are normal. Simple cysts
is identified in the midpole right kidney. There is a 2 mm
nonobstructing stone in lower pole left kidney. No hydronephrosis is
identified bilaterally. The bladder is normal.

Stomach/Bowel: There is a small hiatal hernia. The stomach is
otherwise normal. There is no small bowel obstruction. No colonic
obstruction is identified. The appendix is not seen but no
inflammation is noted around cecum. There is generalized edema and
swelling in the distal rectal wall.

Vascular/Lymphatic: Aortic atherosclerosis. No enlarged abdominal or
pelvic lymph nodes.

Reproductive: Uterus and bilateral adnexa are unremarkable.

Other: None.

Musculoskeletal: Degenerative joint changes of the spine are
identified. Degenerative anterolisthesis of L5 on S1 is noted.
IMPRESSION: Generalized edema and swelling in the distal rectal wall, this can
reflect internal hemorrhoids.

Liver and right kidney cysts.

2 mm nonobstructing stone in lower pole left kidney.

## 2019-12-02 ENCOUNTER — Other Ambulatory Visit: Payer: Self-pay | Admitting: Internal Medicine

## 2019-12-16 ENCOUNTER — Other Ambulatory Visit: Payer: Self-pay | Admitting: Internal Medicine

## 2019-12-24 ENCOUNTER — Other Ambulatory Visit: Payer: Self-pay | Admitting: Internal Medicine

## 2019-12-30 ENCOUNTER — Other Ambulatory Visit: Payer: Self-pay | Admitting: Internal Medicine

## 2020-01-07 ENCOUNTER — Other Ambulatory Visit: Payer: Self-pay | Admitting: Internal Medicine

## 2020-01-08 NOTE — Telephone Encounter (Signed)
Last filled 10/09/2019... please advise due for CPE Sept 2021

## 2020-01-13 ENCOUNTER — Other Ambulatory Visit: Payer: Self-pay | Admitting: Internal Medicine

## 2020-01-27 ENCOUNTER — Other Ambulatory Visit: Payer: Self-pay | Admitting: Internal Medicine

## 2020-03-16 ENCOUNTER — Other Ambulatory Visit: Payer: Self-pay | Admitting: Internal Medicine

## 2020-03-18 ENCOUNTER — Other Ambulatory Visit: Payer: Self-pay

## 2020-03-18 MED ORDER — ESCITALOPRAM OXALATE 10 MG PO TABS
10.0000 mg | ORAL_TABLET | Freq: Every day | ORAL | 0 refills | Status: DC
Start: 2020-03-18 — End: 2020-06-17

## 2020-03-18 MED ORDER — CLOPIDOGREL BISULFATE 75 MG PO TABS
75.0000 mg | ORAL_TABLET | Freq: Every evening | ORAL | 0 refills | Status: DC
Start: 2020-03-18 — End: 2020-08-28

## 2020-03-18 MED ORDER — POTASSIUM CHLORIDE CRYS ER 20 MEQ PO TBCR: EXTENDED_RELEASE_TABLET | ORAL | 0 refills | Status: DC

## 2020-03-30 ENCOUNTER — Other Ambulatory Visit: Payer: Self-pay | Admitting: Internal Medicine

## 2020-03-30 ENCOUNTER — Encounter: Payer: Self-pay | Admitting: Internal Medicine

## 2020-04-13 ENCOUNTER — Other Ambulatory Visit: Payer: Self-pay | Admitting: Internal Medicine

## 2020-04-17 NOTE — Telephone Encounter (Signed)
Dilantin last filled 12/2019, daughter stated she will call to scheduled AWV, but that her sister had passed away and hey were dealing with that having to travel out of town for arrangements... please advise

## 2020-04-26 ENCOUNTER — Other Ambulatory Visit: Payer: Self-pay | Admitting: Internal Medicine

## 2020-05-25 ENCOUNTER — Other Ambulatory Visit: Payer: Self-pay | Admitting: Internal Medicine

## 2020-06-16 ENCOUNTER — Other Ambulatory Visit: Payer: Self-pay | Admitting: Internal Medicine

## 2020-06-20 ENCOUNTER — Encounter: Payer: Self-pay | Admitting: Internal Medicine

## 2020-06-23 ENCOUNTER — Other Ambulatory Visit: Payer: Self-pay | Admitting: Internal Medicine

## 2020-07-07 ENCOUNTER — Other Ambulatory Visit: Payer: Self-pay | Admitting: Internal Medicine

## 2020-07-15 ENCOUNTER — Ambulatory Visit: Payer: Medicare Other | Admitting: Internal Medicine

## 2020-07-28 ENCOUNTER — Other Ambulatory Visit: Payer: Self-pay | Admitting: Internal Medicine

## 2020-07-30 ENCOUNTER — Ambulatory Visit: Payer: Medicare Other

## 2020-08-20 ENCOUNTER — Encounter: Payer: Medicare Other | Admitting: Internal Medicine

## 2020-08-27 ENCOUNTER — Encounter: Payer: Medicare Other | Admitting: Internal Medicine

## 2020-08-28 ENCOUNTER — Encounter: Payer: Self-pay | Admitting: Internal Medicine

## 2020-08-28 ENCOUNTER — Other Ambulatory Visit: Payer: Self-pay

## 2020-08-28 ENCOUNTER — Ambulatory Visit (INDEPENDENT_AMBULATORY_CARE_PROVIDER_SITE_OTHER): Payer: Medicare Other | Admitting: Internal Medicine

## 2020-08-28 VITALS — BP 134/60 | HR 78 | Temp 98.0°F | Ht 62.0 in | Wt 239.0 lb

## 2020-08-28 DIAGNOSIS — F419 Anxiety disorder, unspecified: Secondary | ICD-10-CM

## 2020-08-28 DIAGNOSIS — M79605 Pain in left leg: Secondary | ICD-10-CM | POA: Diagnosis not present

## 2020-08-28 DIAGNOSIS — L659 Nonscarring hair loss, unspecified: Secondary | ICD-10-CM

## 2020-08-28 DIAGNOSIS — N393 Stress incontinence (female) (male): Secondary | ICD-10-CM

## 2020-08-28 DIAGNOSIS — F32A Depression, unspecified: Secondary | ICD-10-CM | POA: Diagnosis not present

## 2020-08-28 DIAGNOSIS — Z8673 Personal history of transient ischemic attack (TIA), and cerebral infarction without residual deficits: Secondary | ICD-10-CM

## 2020-08-28 DIAGNOSIS — Z0000 Encounter for general adult medical examination without abnormal findings: Secondary | ICD-10-CM

## 2020-08-28 DIAGNOSIS — G40909 Epilepsy, unspecified, not intractable, without status epilepticus: Secondary | ICD-10-CM

## 2020-08-28 DIAGNOSIS — I10 Essential (primary) hypertension: Secondary | ICD-10-CM | POA: Diagnosis not present

## 2020-08-28 DIAGNOSIS — G8929 Other chronic pain: Secondary | ICD-10-CM | POA: Diagnosis not present

## 2020-08-28 DIAGNOSIS — R7303 Prediabetes: Secondary | ICD-10-CM

## 2020-08-28 DIAGNOSIS — E782 Mixed hyperlipidemia: Secondary | ICD-10-CM

## 2020-08-28 DIAGNOSIS — Z23 Encounter for immunization: Secondary | ICD-10-CM

## 2020-08-28 LAB — COMPREHENSIVE METABOLIC PANEL
ALT: 14 U/L (ref 0–35)
AST: 15 U/L (ref 0–37)
Albumin: 3.9 g/dL (ref 3.5–5.2)
Alkaline Phosphatase: 87 U/L (ref 39–117)
BUN: 27 mg/dL — ABNORMAL HIGH (ref 6–23)
CO2: 26 mEq/L (ref 19–32)
Calcium: 9.1 mg/dL (ref 8.4–10.5)
Chloride: 105 mEq/L (ref 96–112)
Creatinine, Ser: 1.36 mg/dL — ABNORMAL HIGH (ref 0.40–1.20)
GFR: 36.17 mL/min — ABNORMAL LOW (ref >60.00)
Glucose, Bld: 94 mg/dL (ref 70–99)
Potassium: 4.8 mEq/L (ref 3.5–5.1)
Sodium: 138 mEq/L (ref 135–145)
Total Bilirubin: 0.2 mg/dL (ref 0.2–1.2)
Total Protein: 6.5 g/dL (ref 6.0–8.3)

## 2020-08-28 LAB — LIPID PANEL
Cholesterol: 121 mg/dL (ref 0–200)
HDL: 43.1 mg/dL (ref >39.00)
LDL Cholesterol: 60 mg/dL (ref 0–99)
NonHDL: 77.42
Total CHOL/HDL Ratio: 3
Triglycerides: 89 mg/dL (ref 0.0–149.0)
VLDL: 17.8 mg/dL (ref 0.0–40.0)

## 2020-08-28 LAB — CBC
HCT: 31.1 % — ABNORMAL LOW (ref 36.0–46.0)
Hemoglobin: 10.6 g/dL — ABNORMAL LOW (ref 12.0–15.0)
MCHC: 33.9 g/dL (ref 30.0–36.0)
MCV: 97.2 fl (ref 78.0–100.0)
Platelets: 198 10*3/uL (ref 150.0–400.0)
RBC: 3.21 Mil/uL — ABNORMAL LOW (ref 3.87–5.11)
RDW: 13 % (ref 11.5–15.5)
WBC: 7.6 10*3/uL (ref 4.0–10.5)

## 2020-08-28 LAB — TSH: TSH: 3.37 u[IU]/mL (ref 0.35–4.50)

## 2020-08-28 LAB — HEMOGLOBIN A1C: Hgb A1c MFr Bld: 5.3 % (ref 4.6–6.5)

## 2020-08-28 NOTE — Progress Notes (Signed)
HPI:  Patient presents the clinic today for her subsequent annual Medicare wellness exam.  She is also due to follow-up chronic conditions.  Anxiety and Depression: Persistent, managed on Escitalopram and Trazodone.  She reports just wishing she was no longer on this earth but denies SI/HI.  She is not currently seeing a therapist.  HTN: Her BP today is 134/60.  She is taking Diltiazem, Lisinopril and HCTZ as prescribed.  ECG from 07/2015 reviewed.  HLD: Her last LDL was 75, triglycerides 172, 03/2019.  She denies myalgias on Atorvastatin.  She tries to avoid fried foods.  Prediabetes: Her last A1c was 5.4, 03/2019.  She is not taking any oral diabetic medication at this time.  She does not check her sugars.  She checks her feet routinely.  Seizures: She denies recent seizures on Dilantin.  She is not currently following with neurology.  History of Stroke: Residual weakness.  Managed on Atorvastatin and Plavix.  She does not follow with neurology.  OAB: Persistent urinary frequency and incontinence.  She was unable to tolerate anticholinergic medications.  She is not following with urology.  Chronic Left Leg Pain: Managed with Gabapentin and Amitriptyline.  Past Medical History:  Diagnosis Date  . Allergy   . Arthritis   . Cellulitis of left leg   . Depression   . History of blood transfusion   . Hyperlipidemia   . Hypertension   . Pre-diabetes   . Proctitis   . Skin cancer of nose   . Stroke ([place])   . Ulcer   . Urinary incontinence     Current Outpatient Medications  Medication Sig Dispense Refill  . acetaminophen (TYLENOL) 500 MG tablet Take 1,000 mg by mouth every 8 (eight) hours.     Marland Kitchen amLODipine (NORVASC) 10 MG tablet Take 1 tablet (10 mg total) by mouth daily.  11  . atorvastatin (LIPITOR) 80 MG tablet Take 1 tablet (80 mg total) by mouth daily. 90 tablet 0  . bisacodyl (DULCOLAX) 5 MG EC tablet Take 2 tablets (10 mg total) by mouth daily as needed for moderate  constipation. 30 tablet 0  . CALCIUM PO Take 1 tablet by mouth daily.    . cholecalciferol (VITAMIN D) 1000 units tablet Take 1,000 Units by mouth daily.    . clopidogrel (PLAVIX) 75 MG tablet Take 1 tablet by mouth in the evening 90 tablet 0  . clopidogrel (PLAVIX) 75 MG tablet Take 1 tablet (75 mg total) by mouth every evening. 90 tablet 0  . diltiazem (CARDIZEM CD) 180 MG 24 hr capsule Take 1 capsule by mouth once daily 90 capsule 0  . escitalopram (LEXAPRO) 10 MG tablet Take 1 tablet by mouth once daily 90 tablet 0  . escitalopram (LEXAPRO) 10 MG tablet Take 1 tablet by mouth once daily 90 tablet 0  . gabapentin (NEURONTIN) 300 MG capsule Take 1 capsule by mouth twice daily 180 capsule 0  . hydrochlorothiazide (MICROZIDE) 12.5 MG capsule Take 1 capsule by mouth once daily 90 capsule 0  . hydrocortisone (ANUSOL-HC) 2.5 % rectal cream Place rectally 2 (two) times daily as needed for hemorrhoids or anal itching. 30 g 0  . lisinopril (ZESTRIL) 10 MG tablet Take 1 tablet (10 mg total) by mouth daily. 90 tablet 0  . loratadine (CLARITIN) 10 MG tablet Take 10 mg by mouth daily.    . NYSTATIN powder APPLY  POWDER TOPICALLY TO AFFECTED AREA 4 TIMES DAILY 45 g 2  . phenytoin (DILANTIN) 50 MG tablet  Take 2 tablets by mouth once daily 180 tablet 0  . polyethylene glycol (MIRALAX / GLYCOLAX) packet Take 17 g by mouth daily. 14 each 0  . potassium chloride SA (KLOR-CON) 20 MEQ tablet TAKE 1  BY MOUTH ONCE DAILY 90 tablet 0  . potassium chloride SA (KLOR-CON) 20 MEQ tablet Take 1 tablet by mouth once daily 90 tablet 0  . senna-docusate (SENOKOT-S) 8.6-50 MG tablet Take 1 tablet by mouth 2 (two) times daily.    . traZODone (DESYREL) 50 MG tablet TAKE 3 TABLETS BY MOUTH AT BEDTIME 270 tablet 0  . vitamin B-12 (CYANOCOBALAMIN) 1000 MCG tablet Take 1,000 mcg by mouth daily.     No current facility-administered medications for this visit.    Allergies  Allergen Reactions  . Codeine Nausea And Vomiting  .  Ether Nausea And Vomiting    Family History  Problem Relation Age of Onset  . Heart disease Other        Parent  . Cancer Maternal Uncle     Social History   Socioeconomic History  . Marital status: Widowed    Spouse name: Not on file  . Number of children: 4  . Years of education: 34  . Highest education level: Not on file  Occupational History  . Occupation: Retired  Tobacco Use  . Smoking status: Never Smoker  . Smokeless tobacco: Never Used  Substance and Sexual Activity  . Alcohol use: No  . Drug use: No  . Sexual activity: Never  Other Topics Concern  . Not on file  Social History Narrative   Regular exercise-no   Caffeine Use-yes   Social Determinants of Health   Financial Resource Strain: Not on file  Food Insecurity: Not on file  Transportation Needs: Not on file  Physical Activity: Not on file  Stress: Not on file  Social Connections: Not on file  Intimate Partner Violence: Not on file    Hospitiliaztions: None  Health Maintenance:    Flu: 03/2019  Tetanus: 04/2016  Pneumovax: 05/2017  Prevnar: 03/2014  Shingrix: Never  Covid: Pfizer x3  Mammogram: No longer screening  Pap Smear: No longer screening  Bone Density: No longer screening  Colon Screening: No longer screening  Eye Doctor: Annually  Dental Exam: As needed   Providers:   PCP: Webb Silversmith, NP   I have personally reviewed and have noted:  1. The patient's medical and social history 2. Their use of alcohol, tobacco or illicit drugs 3. Their current medications and supplements 4. The patient's functional ability including ADL's, fall risks, home safety risks and  hearing or visual impairment. 5. Diet and physical activities 6. Evidence for depression or mood disorder  Subjective:   Review of Systems:   Constitutional: Patient reports fatigue.  Denies fever, malaise, headache or abrupt weight changes.  HEENT: Denies eye pain, eye redness, ear pain, ringing in the ears, wax  buildup, runny nose, nasal congestion, bloody nose, or sore throat. Respiratory: Denies difficulty breathing, shortness of breath, cough or sputum production.   Cardiovascular: Denies chest pain, chest tightness, palpitations or swelling in the hands or feet.  Gastrointestinal: Patient reports intermittent constipation.  Denies abdominal pain, bloating, diarrhea or blood in the stool.  GU: Patient reports urinary frequency and incontinence.  Denies urgency, pain with urination, burning sensation, blood in urine, odor or discharge. Musculoskeletal: Patient reports chronic left leg pain.  Denies decrease in range of motion, difficulty with gait, or joint pain and swelling.  Skin: Denies  redness, rashes, lesions or ulcercations.  Neurological: Patient has a history of insomnia.  Denies dizziness, difficulty with memory, difficulty with speech or problems with balance and coordination.  Psych: Patient has a history of anxiety and depression.  Denies SI/HI.  No other specific complaints in a complete review of systems (except as listed in HPI above).  Objective:  PE:   BP 134/60   Pulse 78   Temp 98 F (36.7 C) (Temporal)   Ht 5' 2"  (1.575 m)   Wt 239 lb (108.4 kg)   SpO2 98%   BMI 43.71 kg/m   Wt Readings from Last 3 Encounters:  03/24/19 246 lb (111.6 kg)  04/20/18 267 lb 3.2 oz (121.2 kg)  05/25/17 256 lb (116.1 kg)    General: Appears her stated age, chronically ill-appearing, obese in NAD. Skin: Warm, dry and intact.  HEENT: Head: normal shape and size, thinning hair noted; Eyes: sclera white, no icterus, conjunctiva pink, PERRLA and EOMs intact; Neck: Neck supple, trachea midline. No masses, lumps or thyromegaly present.  Cardiovascular: Normal rate and rhythm. S1,S2 noted.  No murmur, rubs or gallops noted. No JVD or BLE edema. No carotid bruits noted. Pulmonary/Chest: Normal effort and positive vesicular breath sounds. No respiratory distress. No wheezes, rales or ronchi  noted.  Abdomen: Soft and nontender. Normal bowel sounds. No distention or masses noted. Musculoskeletal: In wheelchair. Neurological: Alert and oriented. Cranial nerves II-XII grossly intact. Coordination normal.  Psychiatric: Tearful behavior is normal. Judgment and thought content normal.     BMET    Component Value Date/Time   NA 137 03/24/2019 1557   K 5.0 03/24/2019 1557   CL 104 03/24/2019 1557   CO2 25 03/24/2019 1557   GLUCOSE 95 03/24/2019 1557   BUN 30 (H) 03/24/2019 1557   CREATININE 1.39 (H) 03/24/2019 1557   CALCIUM 9.3 03/24/2019 1557   GFRNONAA 48 (L) 04/26/2018 0417   GFRAA 56 (L) 04/26/2018 0417    Lipid Panel     Component Value Date/Time   CHOL 142 03/24/2019 1557   TRIG 172 (H) 03/24/2019 1557   HDL 40 (L) 03/24/2019 1557   CHOLHDL 3.6 03/24/2019 1557   VLDL 22.4 05/25/2017 1111   LDLCALC 75 03/24/2019 1557    CBC    Component Value Date/Time   WBC 8.9 03/24/2019 1557   RBC 3.54 (L) 03/24/2019 1557   HGB 11.4 (L) 03/24/2019 1557   HCT 34.5 (L) 03/24/2019 1557   PLT 208 03/24/2019 1557   MCV 97.5 03/24/2019 1557   MCH 32.2 03/24/2019 1557   MCHC 33.0 03/24/2019 1557   RDW 12.4 03/24/2019 1557   LYMPHSABS 0.9 08/22/2015 1410   MONOABS 1.0 08/22/2015 1410   EOSABS 0.1 08/22/2015 1410   BASOSABS 0.0 08/22/2015 1410    Hgb A1C Lab Results  Component Value Date   HGBA1C 5.4 03/24/2019      Assessment and Plan:   Medicare Annual Wellness Visit:  Diet: She does eat meat.  She consumes fruits and vegetables.  She tries to avoid fried foods Physical activity: Sedentary Depression/mood screen: Chronic, PHQ 9 score of 1 Hearing: Intact to whispered voice Visual acuity: Grossly normal, performs annual eye exam  ADLs: Needs assistance due to generalized weakness Fall risk: High secondary to generalized weakness Home safety: Good Cognitive evaluation: Intact to orientation, naming, recall and repetition EOL planning: Adv directives, DNR/  I agree  Preventative Medicine: Flu shot today.  Tetanus, Pneumovax, Prevnar and Covid UTD.  Encouraged her to  get a Shingrix vaccine.  She no longer wants to screen for breast, cervical, colon cancer or osteoporosis.  Encouraged her to consume a balanced diet and exercise regimen.  Advised her to see an eye doctor and dentist annually.  Will check CBC, TSH, c-Met, lipid, A1c today.  Due dates for screening exam given to patient as part of her AVS.  Thinning Hair:  TSH today  Next appointment: 6 months, follow-up chronic conditions   Webb Silversmith, NP This visit occurred during the SARS-CoV-2 public health emergency.  Safety protocols were in place, including screening questions prior to the visit, additional usage of staff PPE, and extensive cleaning of exam room while observing appropriate contact time as indicated for disinfecting solutions.

## 2020-08-29 ENCOUNTER — Encounter: Payer: Self-pay | Admitting: Internal Medicine

## 2020-08-29 NOTE — Assessment & Plan Note (Signed)
Controlled on diltiazem, lisinopril and HCTZ Reinforced DASH diet  C met today

## 2020-08-29 NOTE — Assessment & Plan Note (Signed)
Persistent generalized weakness She is not motivated to participate in PT/OT CBC and c-Met today Continue atorvastatin and Plavix

## 2020-08-29 NOTE — Assessment & Plan Note (Signed)
Continue gabapentin and amitriptyline We will monitor

## 2020-08-29 NOTE — Patient Instructions (Signed)

## 2020-08-29 NOTE — Assessment & Plan Note (Signed)
Persistent despite Escitalopram and Trazodone Wean not indicated at this time Support offered

## 2020-08-29 NOTE — Assessment & Plan Note (Signed)
Continue Dilantin C met today

## 2020-08-29 NOTE — Assessment & Plan Note (Signed)
C-Met and lipid profile today Encouraged her to consume a low-fat diet Continue atorvastatin 

## 2020-08-29 NOTE — Assessment & Plan Note (Signed)
A1c today Encourage low-carb diet

## 2020-08-29 NOTE — Assessment & Plan Note (Signed)
Unable to tolerate anticholinergics Encouraged timed voiding We will monitor

## 2020-08-30 ENCOUNTER — Encounter: Payer: Self-pay | Admitting: Internal Medicine

## 2020-08-30 DIAGNOSIS — D649 Anemia, unspecified: Secondary | ICD-10-CM

## 2020-09-08 ENCOUNTER — Other Ambulatory Visit: Payer: Self-pay | Admitting: Internal Medicine

## 2020-09-15 ENCOUNTER — Other Ambulatory Visit: Payer: Self-pay | Admitting: Internal Medicine

## 2020-09-19 ENCOUNTER — Telehealth: Payer: Self-pay

## 2020-09-19 NOTE — Telephone Encounter (Signed)
Per PCP consult - contacted patient's daughter to discuss taper off phenytoin. Current dose is 50 mg - 2 chewable tablets daily. Tablets are scored.  I recommend a conservative taper due to long term therapy. She denies seizure activity in at least 7 years and unknown prior history. Recomend reducing dose by 25% monthly for 3 months, then stop.  Phenytoin 50 mg tablet - take 75 mg (1 and 1/2 tablets) daily x 4 weeks, then 50 mg (1 tablet) daily x 4 weeks, then 25 mg daily (1/2 tablet) x 4 weeks then stop.  Call PCP with any concerns.  Debbora Dus, PharmD Clinical Pharmacist [facility] [phone]

## 2020-09-19 NOTE — Chronic Care Management (AMB) (Signed)
  Chronic Care Management   Note  09/19/2020 Name: Chester Romero MRN: [id number] DOB: 05-16-1936  Linday Rhodes is a 85 y.o. year old female who is a primary care patient of Jearld Fenton, NP. I reached out to [employer] by phone today in response to a referral sent by Ms. [place] Stallman's PCP, Jearld Fenton, NP     Ms. Donald was given information about Chronic Care Management services today including:  1. CCM service includes personalized support from designated clinical staff supervised by her physician, including individualized plan of care and coordination with other care providers 2. 24/7 contact phone numbers for assistance for urgent and routine care needs. 3. Service will only be billed when office clinical staff spend 20 minutes or more in a month to coordinate care. 4. Only one practitioner may furnish and bill the service in a calendar month. 5. The patient may stop CCM services at any time (effective at the end of the month) by phone call to the office staff. 6. The patient will be responsible for cost sharing (co-pay) of up to 20% of the service fee (after annual deductible is met).  Patient agreed to services and verbal consent obtained.   Follow up plan: Telephone appointment with care management team member scheduled for:09/20/2020  Noreene Larsson, [place], [place], [place] [address] Direct Dial: [phone] [email or web address] Website: [email or web address]

## 2020-09-19 NOTE — Chronic Care Management (AMB) (Signed)
  Chronic Care Management   Outreach Note  09/19/2020 Name: Carol Page MRN: [id number] DOB: May 12, 1936  Carol Page is a 85 y.o. year old female who is a primary care patient of Jearld Fenton, NP. I reached out to [employer] by phone today in response to a referral sent by [name]'s PCP, Jearld Fenton, NP     An unsuccessful telephone outreach was attempted today. The patient was referred to the case management team for assistance with care management and care coordination.   Follow Up Plan: A HIPAA compliant phone message was left for the patient providing contact information and requesting a return call.  The care management team will reach out to the patient again over the next 4 days.  If patient returns call to provider office, please advise to call [facility]  at [place], [place], [place], [place] [address] Direct Dial: [phone] [email or web address]: [email or web address]

## 2020-09-20 ENCOUNTER — Telehealth: Payer: Medicare Other

## 2020-09-20 ENCOUNTER — Telehealth: Payer: Self-pay

## 2020-09-20 NOTE — Telephone Encounter (Signed)
  Chronic Care Management   Outreach Note  09/20/2020 Name: Carol Page MRN: [id number] DOB: [birth year]  Referred by: Jearld Fenton, NP Reason for referral : Chronic Care Management (Initial telephone assessment)   An unsuccessful telephone outreach was attempted today. The patient was referred to the case management team for assistance with care management and care coordination.   Follow Up Plan: A HIPAA compliant phone message was left for the patient providing contact information and requesting a return call.  The care management team will reach out to the patient again over the next 7-10  days.   Quinn Plowman RN,BSN,CCM RN Case Manager [place]  [phone]

## 2020-09-22 ENCOUNTER — Other Ambulatory Visit: Payer: Self-pay | Admitting: Internal Medicine

## 2020-09-27 ENCOUNTER — Ambulatory Visit: Payer: Medicare Other

## 2020-09-27 NOTE — Chronic Care Management (AMB) (Addendum)
  Chronic Care Management   Outreach Note  09/27/2020 Name: Carol Page MRN: [id number] DOB: Dec 07, 1935  Referred by: Jearld Fenton, NP Reason for referral : Chronic Care Management (Initial telephone assessment)   Successful contact was made with patient's daughter and designated party release, [name] for initial telephone assessment. HIPAA verified. Daughter states she is a Marine scientist and manages patients medical care with her provider.  She states she does not feel that care management services are needed at this time for the patient and declined services.    Follow Up Plan: Daughter was advised to contact patients primary care provider If care management services needed in the future. Daughter verbalized understanding and appreciation of call.    Primary care provider notified of refusal of services.   Quinn Plowman RN,BSN,CCM RN Case Manager [place]  [phone]

## 2020-10-10 ENCOUNTER — Other Ambulatory Visit: Payer: Self-pay | Admitting: Internal Medicine

## 2020-10-22 ENCOUNTER — Other Ambulatory Visit: Payer: Self-pay | Admitting: Internal Medicine

## 2020-11-29 ENCOUNTER — Other Ambulatory Visit: Payer: Self-pay | Admitting: Internal Medicine

## 2020-11-29 DIAGNOSIS — D509 Iron deficiency anemia, unspecified: Secondary | ICD-10-CM

## 2020-12-09 ENCOUNTER — Other Ambulatory Visit: Payer: Self-pay | Admitting: Internal Medicine

## 2020-12-10 NOTE — Telephone Encounter (Signed)
Requested medication (s) are due for refill today: Yes  Requested medication (s) are on the active medication list: Yes  Last refill:  06/09/20, 09/16/20  Future visit scheduled: No  Notes to clinic:  Unable to refill per protocol, appointment needed.      Requested Prescriptions  Pending Prescriptions Disp Refills   potassium chloride SA (KLOR-CON) 20 MEQ tablet [Pharmacy Med Name: Potassium Chloride Crys ER 20 MEQ Oral Tablet Extended Release] 90 tablet 0    Sig: TAKE 1  BY MOUTH ONCE DAILY      Endocrinology:  Minerals - Potassium Supplementation Failed - 12/10/2020 11:53 AM      Failed - Cr in normal range and within 360 days    Creat  Date Value Ref Range Status  03/24/2019 1.39 (H) 0.60 - 0.88 mg/dL Final    Comment:    For patients >47 years of age, the reference limit for Creatinine is approximately 13% higher for people identified as African-American. .    Creatinine, Ser  Date Value Ref Range Status  08/28/2020 1.36 (H) 0.40 - 1.20 mg/dL Final          Failed - Valid encounter within last 12 months    Recent Outpatient Visits   None              Passed - K in normal range and within 360 days    Potassium  Date Value Ref Range Status  08/28/2020 4.8 3.5 - 5.1 mEq/L Final            clopidogrel (PLAVIX) 75 MG tablet [Pharmacy Med Name: Clopidogrel Bisulfate 75 MG Oral Tablet] 90 tablet 0    Sig: Take 1 tablet by mouth in the evening      Hematology: Antiplatelets - clopidogrel Failed - 12/10/2020 11:53 AM      Failed - Evaluate AST, ALT within 2 months of therapy initiation.      Failed - HCT in normal range and within 180 days    HCT  Date Value Ref Range Status  08/28/2020 31.1 (L) 36.0 - 46.0 % Final          Failed - HGB in normal range and within 180 days    Hemoglobin  Date Value Ref Range Status  08/28/2020 10.6 (L) 12.0 - 15.0 g/dL Final          Failed - Valid encounter within last 6 months    Recent Outpatient Visits    None              Passed - ALT in normal range and within 360 days    ALT  Date Value Ref Range Status  08/28/2020 14 0 - 35 U/L Final          Passed - AST in normal range and within 360 days    AST  Date Value Ref Range Status  08/28/2020 15 0 - 37 U/L Final          Passed - PLT in normal range and within 180 days    Platelets  Date Value Ref Range Status  08/28/2020 198.0 150.0 - 400.0 K/uL Final

## 2020-12-16 ENCOUNTER — Other Ambulatory Visit: Payer: Self-pay | Admitting: Internal Medicine

## 2021-01-06 ENCOUNTER — Other Ambulatory Visit: Payer: Self-pay

## 2021-01-06 ENCOUNTER — Other Ambulatory Visit: Payer: Medicare Other

## 2021-01-06 DIAGNOSIS — D509 Iron deficiency anemia, unspecified: Secondary | ICD-10-CM | POA: Diagnosis not present

## 2021-01-07 ENCOUNTER — Inpatient Hospital Stay (HOSPITAL_COMMUNITY): Payer: Medicare Other

## 2021-01-07 ENCOUNTER — Other Ambulatory Visit: Payer: Self-pay

## 2021-01-07 ENCOUNTER — Inpatient Hospital Stay (HOSPITAL_COMMUNITY)
Admission: EM | Admit: 2021-01-07 | Discharge: 2021-01-11 | DRG: 377 | Disposition: A | Discharge status: Home Health | Payer: Medicare Other | Attending: Internal Medicine | Admitting: Internal Medicine

## 2021-01-07 DIAGNOSIS — Z888 Allergy status to other drugs, medicaments and biological substances status: Secondary | ICD-10-CM

## 2021-01-07 DIAGNOSIS — D649 Anemia, unspecified: Secondary | ICD-10-CM | POA: Diagnosis not present

## 2021-01-07 DIAGNOSIS — K31819 Angiodysplasia of stomach and duodenum without bleeding: Secondary | ICD-10-CM | POA: Diagnosis present

## 2021-01-07 DIAGNOSIS — K31811 Angiodysplasia of stomach and duodenum with bleeding: Principal | ICD-10-CM | POA: Diagnosis present

## 2021-01-07 DIAGNOSIS — E785 Hyperlipidemia, unspecified: Secondary | ICD-10-CM | POA: Diagnosis present

## 2021-01-07 DIAGNOSIS — K922 Gastrointestinal hemorrhage, unspecified: Secondary | ICD-10-CM | POA: Diagnosis not present

## 2021-01-07 DIAGNOSIS — K5730 Diverticulosis of large intestine without perforation or abscess without bleeding: Secondary | ICD-10-CM | POA: Diagnosis present

## 2021-01-07 DIAGNOSIS — Z66 Do not resuscitate: Secondary | ICD-10-CM | POA: Diagnosis not present

## 2021-01-07 DIAGNOSIS — G40909 Epilepsy, unspecified, not intractable, without status epilepticus: Secondary | ICD-10-CM | POA: Diagnosis not present

## 2021-01-07 DIAGNOSIS — G894 Chronic pain syndrome: Secondary | ICD-10-CM | POA: Diagnosis present

## 2021-01-07 DIAGNOSIS — K449 Diaphragmatic hernia without obstruction or gangrene: Secondary | ICD-10-CM | POA: Diagnosis not present

## 2021-01-07 DIAGNOSIS — I5032 Chronic diastolic (congestive) heart failure: Secondary | ICD-10-CM | POA: Diagnosis present

## 2021-01-07 DIAGNOSIS — N200 Calculus of kidney: Secondary | ICD-10-CM | POA: Diagnosis not present

## 2021-01-07 DIAGNOSIS — Z79899 Other long term (current) drug therapy: Secondary | ICD-10-CM

## 2021-01-07 DIAGNOSIS — M4317 Spondylolisthesis, lumbosacral region: Secondary | ICD-10-CM | POA: Diagnosis not present

## 2021-01-07 DIAGNOSIS — N170 Acute kidney failure with tubular necrosis: Secondary | ICD-10-CM | POA: Diagnosis not present

## 2021-01-07 DIAGNOSIS — D509 Iron deficiency anemia, unspecified: Secondary | ICD-10-CM | POA: Diagnosis not present

## 2021-01-07 DIAGNOSIS — I130 Hypertensive heart and chronic kidney disease with heart failure and stage 1 through stage 4 chronic kidney disease, or unspecified chronic kidney disease: Secondary | ICD-10-CM | POA: Diagnosis present

## 2021-01-07 DIAGNOSIS — Z6841 Body Mass Index (BMI) 40.0 and over, adult: Secondary | ICD-10-CM | POA: Diagnosis not present

## 2021-01-07 DIAGNOSIS — Z20822 Contact with and (suspected) exposure to covid-19: Secondary | ICD-10-CM | POA: Diagnosis present

## 2021-01-07 DIAGNOSIS — N179 Acute kidney failure, unspecified: Secondary | ICD-10-CM

## 2021-01-07 DIAGNOSIS — N1330 Unspecified hydronephrosis: Secondary | ICD-10-CM | POA: Diagnosis not present

## 2021-01-07 DIAGNOSIS — K5750 Diverticulosis of both small and large intestine without perforation or abscess without bleeding: Secondary | ICD-10-CM | POA: Diagnosis not present

## 2021-01-07 DIAGNOSIS — Z809 Family history of malignant neoplasm, unspecified: Secondary | ICD-10-CM | POA: Diagnosis not present

## 2021-01-07 DIAGNOSIS — R011 Cardiac murmur, unspecified: Secondary | ICD-10-CM | POA: Diagnosis not present

## 2021-01-07 DIAGNOSIS — E782 Mixed hyperlipidemia: Secondary | ICD-10-CM | POA: Diagnosis not present

## 2021-01-07 DIAGNOSIS — Z8249 Family history of ischemic heart disease and other diseases of the circulatory system: Secondary | ICD-10-CM

## 2021-01-07 DIAGNOSIS — K921 Melena: Secondary | ICD-10-CM | POA: Diagnosis not present

## 2021-01-07 DIAGNOSIS — Z7982 Long term (current) use of aspirin: Secondary | ICD-10-CM

## 2021-01-07 DIAGNOSIS — I351 Nonrheumatic aortic (valve) insufficiency: Secondary | ICD-10-CM | POA: Diagnosis not present

## 2021-01-07 DIAGNOSIS — N183 Chronic kidney disease, stage 3 unspecified: Secondary | ICD-10-CM | POA: Diagnosis present

## 2021-01-07 DIAGNOSIS — K2950 Unspecified chronic gastritis without bleeding: Secondary | ICD-10-CM | POA: Diagnosis not present

## 2021-01-07 DIAGNOSIS — I10 Essential (primary) hypertension: Secondary | ICD-10-CM | POA: Diagnosis not present

## 2021-01-07 DIAGNOSIS — Z85828 Personal history of other malignant neoplasm of skin: Secondary | ICD-10-CM

## 2021-01-07 DIAGNOSIS — K648 Other hemorrhoids: Secondary | ICD-10-CM | POA: Diagnosis not present

## 2021-01-07 DIAGNOSIS — I350 Nonrheumatic aortic (valve) stenosis: Secondary | ICD-10-CM | POA: Diagnosis present

## 2021-01-07 DIAGNOSIS — I517 Cardiomegaly: Secondary | ICD-10-CM | POA: Diagnosis not present

## 2021-01-07 DIAGNOSIS — N1832 Chronic kidney disease, stage 3b: Secondary | ICD-10-CM | POA: Diagnosis not present

## 2021-01-07 DIAGNOSIS — K2270 Barrett's esophagus without dysplasia: Secondary | ICD-10-CM | POA: Diagnosis present

## 2021-01-07 DIAGNOSIS — Z8673 Personal history of transient ischemic attack (TIA), and cerebral infarction without residual deficits: Secondary | ICD-10-CM

## 2021-01-07 DIAGNOSIS — K31A15 Gastric intestinal metaplasia without dysplasia, involving multiple sites: Secondary | ICD-10-CM | POA: Diagnosis not present

## 2021-01-07 DIAGNOSIS — Z885 Allergy status to narcotic agent status: Secondary | ICD-10-CM

## 2021-01-07 DIAGNOSIS — Z7902 Long term (current) use of antithrombotics/antiplatelets: Secondary | ICD-10-CM

## 2021-01-07 LAB — CBC
HCT: 23.5 % — ABNORMAL LOW (ref 36.0–46.0)
Hemoglobin: 7 g/dL — ABNORMAL LOW (ref 12.0–15.0)
MCH: 28.2 pg (ref 26.0–34.0)
MCHC: 29.8 g/dL — ABNORMAL LOW (ref 30.0–36.0)
MCV: 94.8 fL (ref 80.0–100.0)
Platelets: 254 10*3/uL (ref 150–400)
RBC: 2.48 MIL/uL — ABNORMAL LOW (ref 3.87–5.11)
RDW: 13.2 % (ref 11.5–15.5)
WBC: 6.9 10*3/uL (ref 4.0–10.5)
nRBC: 0 % (ref 0.0–0.2)

## 2021-01-07 LAB — COMPREHENSIVE METABOLIC PANEL
ALT: 11 U/L (ref 0–44)
AST: 17 U/L (ref 15–41)
Albumin: 3.2 g/dL — ABNORMAL LOW (ref 3.5–5.0)
Alkaline Phosphatase: 67 U/L (ref 38–126)
Anion gap: 12 (ref 5–15)
BUN: 37 mg/dL — ABNORMAL HIGH (ref 8–23)
CO2: 20 mmol/L — ABNORMAL LOW (ref 22–32)
Calcium: 8.7 mg/dL — ABNORMAL LOW (ref 8.9–10.3)
Chloride: 105 mmol/L (ref 98–111)
Creatinine, Ser: 1.85 mg/dL — ABNORMAL HIGH (ref 0.44–1.00)
GFR, Estimated: 27 mL/min — ABNORMAL LOW (ref >60)
Glucose, Bld: 102 mg/dL — ABNORMAL HIGH (ref 70–99)
Potassium: 4.8 mmol/L (ref 3.5–5.1)
Sodium: 137 mmol/L (ref 135–145)
Total Bilirubin: 0.4 mg/dL (ref 0.3–1.2)
Total Protein: 6.1 g/dL — ABNORMAL LOW (ref 6.5–8.1)

## 2021-01-07 LAB — CBC WITH DIFFERENTIAL/PLATELET
Absolute Monocytes: 632 cells/uL (ref 200–950)
Basophils Absolute: 27 cells/uL (ref 0–200)
Basophils Relative: 0.4 %
Eosinophils Absolute: 245 cells/uL (ref 15–500)
Eosinophils Relative: 3.6 %
HCT: 23.8 % — ABNORMAL LOW (ref 35.0–45.0)
Hemoglobin: 7.2 g/dL — ABNORMAL LOW (ref 11.7–15.5)
Lymphs Abs: 1034 cells/uL (ref 850–3900)
MCH: 28.3 pg (ref 27.0–33.0)
MCHC: 30.3 g/dL — ABNORMAL LOW (ref 32.0–36.0)
MCV: 93.7 fL (ref 80.0–100.0)
MPV: 10.4 fL (ref 7.5–12.5)
Monocytes Relative: 9.3 %
Neutro Abs: 4862 cells/uL (ref 1500–7800)
Neutrophils Relative %: 71.5 %
Platelets: 242 10*3/uL (ref 140–400)
RBC: 2.54 10*6/uL — ABNORMAL LOW (ref 3.80–5.10)
RDW: 12.4 % (ref 11.0–15.0)
Total Lymphocyte: 15.2 %
WBC: 6.8 10*3/uL (ref 3.8–10.8)

## 2021-01-07 LAB — ABO/RH: ABO/RH(D): A NEG

## 2021-01-07 LAB — IRON,TIBC AND FERRITIN PANEL
%SAT: 6 % (calc) — ABNORMAL LOW (ref 16–45)
Ferritin: 8 ng/mL — ABNORMAL LOW (ref 16–288)
Iron: 22 ug/dL — ABNORMAL LOW (ref 45–160)
TIBC: 357 mcg/dL (calc) (ref 250–450)

## 2021-01-07 LAB — POC OCCULT BLOOD, ED: Fecal Occult Bld: NEGATIVE

## 2021-01-07 LAB — PREPARE RBC (CROSSMATCH)

## 2021-01-07 MED ORDER — TRAZODONE HCL 50 MG PO TABS
150.0000 mg | ORAL_TABLET | Freq: Every day | ORAL | Status: DC
  Administered 2021-01-07 – 2021-01-10 (×4): 150 mg via ORAL
  Filled 2021-01-07: qty 3
  Filled 2021-01-07 (×3): qty 1

## 2021-01-07 MED ORDER — SENNOSIDES-DOCUSATE SODIUM 8.6-50 MG PO TABS
1.0000 | ORAL_TABLET | Freq: Two times a day (BID) | ORAL | Status: DC
  Administered 2021-01-07 – 2021-01-11 (×5): 1 via ORAL
  Filled 2021-01-07 (×7): qty 1

## 2021-01-07 MED ORDER — BISACODYL 5 MG PO TBEC: 10.0000 mg | DELAYED_RELEASE_TABLET | Freq: Every day | ORAL | Status: DC | PRN

## 2021-01-07 MED ORDER — HYDROCORTISONE 2.5 % RE CREA
TOPICAL_CREAM | Freq: Two times a day (BID) | RECTAL | Status: DC | PRN
  Filled 2021-01-07: qty 28.35

## 2021-01-07 MED ORDER — POTASSIUM CHLORIDE CRYS ER 10 MEQ PO TBCR
10.0000 meq | EXTENDED_RELEASE_TABLET | Freq: Every day | ORAL | Status: DC
  Administered 2021-01-08: 10 meq via ORAL
  Filled 2021-01-07: qty 1

## 2021-01-07 MED ORDER — HYDROCHLOROTHIAZIDE 12.5 MG PO CAPS
12.5000 mg | ORAL_CAPSULE | Freq: Every day | ORAL | Status: DC
  Administered 2021-01-08: 12.5 mg via ORAL
  Filled 2021-01-07: qty 1

## 2021-01-07 MED ORDER — PANTOPRAZOLE SODIUM 40 MG IV SOLR
40.0000 mg | Freq: Once | INTRAVENOUS | Status: AC
  Administered 2021-01-07: 40 mg via INTRAVENOUS
  Filled 2021-01-07: qty 40

## 2021-01-07 MED ORDER — SODIUM CHLORIDE 0.9 % IV SOLN
200.0000 mg | INTRAVENOUS | Status: DC
  Administered 2021-01-08: 200 mg via INTRAVENOUS
  Filled 2021-01-07 (×2): qty 10

## 2021-01-07 MED ORDER — ATORVASTATIN CALCIUM 80 MG PO TABS
80.0000 mg | ORAL_TABLET | Freq: Every day | ORAL | Status: DC
  Administered 2021-01-08 – 2021-01-11 (×4): 80 mg via ORAL
  Filled 2021-01-07 (×3): qty 1
  Filled 2021-01-07: qty 2

## 2021-01-07 MED ORDER — HYDRALAZINE HCL 25 MG PO TABS: 25.0000 mg | ORAL_TABLET | Freq: Four times a day (QID) | ORAL | Status: DC | PRN

## 2021-01-07 MED ORDER — PANTOPRAZOLE SODIUM 40 MG IV SOLR
40.0000 mg | Freq: Two times a day (BID) | INTRAVENOUS | Status: DC
  Administered 2021-01-07 – 2021-01-10 (×7): 40 mg via INTRAVENOUS
  Filled 2021-01-07 (×8): qty 40

## 2021-01-07 MED ORDER — ESCITALOPRAM OXALATE 10 MG PO TABS
10.0000 mg | ORAL_TABLET | Freq: Every day | ORAL | Status: DC
  Administered 2021-01-08 – 2021-01-11 (×4): 10 mg via ORAL
  Filled 2021-01-07 (×4): qty 1

## 2021-01-07 MED ORDER — SODIUM CHLORIDE 0.9 % IV SOLN: 510.0000 mg | Freq: Once | INTRAVENOUS | Status: DC

## 2021-01-07 MED ORDER — VITAMIN D 25 MCG (1000 UNIT) PO TABS
1000.0000 [IU] | ORAL_TABLET | Freq: Every day | ORAL | Status: DC
  Administered 2021-01-08 – 2021-01-11 (×4): 1000 [IU] via ORAL
  Filled 2021-01-07 (×4): qty 1

## 2021-01-07 MED ORDER — DILTIAZEM HCL 60 MG PO TABS
30.0000 mg | ORAL_TABLET | Freq: Four times a day (QID) | ORAL | Status: DC
  Administered 2021-01-07 – 2021-01-11 (×13): 30 mg via ORAL
  Filled 2021-01-07 (×14): qty 1

## 2021-01-07 MED ORDER — PHENYTOIN 50 MG PO CHEW: 100.0000 mg | CHEWABLE_TABLET | Freq: Every day | ORAL | Status: DC

## 2021-01-07 MED ORDER — LISINOPRIL 10 MG PO TABS
10.0000 mg | ORAL_TABLET | Freq: Every day | ORAL | Status: DC
  Administered 2021-01-08: 10 mg via ORAL
  Filled 2021-01-07: qty 1

## 2021-01-07 MED ORDER — LORATADINE 10 MG PO TABS
10.0000 mg | ORAL_TABLET | Freq: Every day | ORAL | Status: DC
  Administered 2021-01-08 – 2021-01-11 (×4): 10 mg via ORAL
  Filled 2021-01-07 (×4): qty 1

## 2021-01-07 MED ORDER — GABAPENTIN 300 MG PO CAPS
300.0000 mg | ORAL_CAPSULE | Freq: Two times a day (BID) | ORAL | Status: DC
  Administered 2021-01-07 – 2021-01-11 (×8): 300 mg via ORAL
  Filled 2021-01-07 (×8): qty 1

## 2021-01-07 MED ORDER — POLYETHYLENE GLYCOL 3350 17 G PO PACK
17.0000 g | PACK | Freq: Every day | ORAL | Status: DC
  Administered 2021-01-08: 17 g via ORAL
  Filled 2021-01-07: qty 1

## 2021-01-07 MED ORDER — VITAMIN B-12 1000 MCG PO TABS
1000.0000 ug | ORAL_TABLET | Freq: Every day | ORAL | Status: DC
  Administered 2021-01-08 – 2021-01-11 (×4): 1000 ug via ORAL
  Filled 2021-01-07 (×4): qty 1

## 2021-01-07 MED ORDER — SODIUM CHLORIDE 0.9 % IV SOLN
10.0000 mL/h | Freq: Once | INTRAVENOUS | Status: AC
  Administered 2021-01-07: 10 mL/h via INTRAVENOUS

## 2021-01-07 MED ORDER — FUROSEMIDE 10 MG/ML IJ SOLN
40.0000 mg | Freq: Once | INTRAMUSCULAR | Status: AC
  Administered 2021-01-07: 40 mg via INTRAVENOUS

## 2021-01-07 NOTE — H&P (Signed)
History and Physical    Carol Page:[id number] DOB: 22-Aug-1935 DOA: 01/07/2021  PCP: Jearld Fenton, NP (Confirm with patient/family/NH records and if not entered, this has to be entered at [facility] point of entry) Patient coming from: Home  I have personally briefly reviewed patient's old medical records in [place]  Chief Complaint: Feeling tired.  HPI: Carol Page is a 85 y.o. female with medical history significant of Cameron ulcer, HTN, remote history of CVA on Plavix, HLD, hemorrhoids, chronic urinary incontinence, anxiety/depression, morbid obesity, presented with worsening of anemia.  About 1 month ago patient went to see her PCP for regular checkup, blood work showed patient had iron deficiency anemia.  Patient however did not tolerate iron supplement and family has been giving iron rich diet.  Is morbid obese with BMI 43, with left-sided lifestyle sedentary, but she does complain of occasional chest pains with minimum movement.  She did notice her stool color has turned darker in the recent month, but denied any abdominal pain, shortness of breath, lightheadedness.  Yesterday, she patient went back to see her PCP and repeat iron study remains low and her hemoglobin dropped from about 10-7.2 yesterday.  And patient advised to come to ED.  Patient denied taking any NSAIDs on daily basis.  ED Course: Repeat hemoglobin 7.0.  Vital signs no tachycardia no hypotension. [facility] consulted.  Guaiac test negative.  Review of Systems: As per HPI otherwise 14 point review of systems negative.    Past Medical History:  Diagnosis Date   Allergy    Arthritis    Cellulitis of left leg    Depression    History of blood transfusion    Hyperlipidemia    Hypertension    Pre-diabetes    Proctitis    Skin cancer of nose    Stroke [facility])    Ulcer    Urinary incontinence     Past Surgical History:  Procedure Laterality Date   BILATERAL OOPHORECTOMY  06/2012   ovarian cysts    SKIN CANCER EXCISION  2007   reomved from nose   TONSILLECTOMY  1944   TUBAL LIGATION       reports that she has never smoked. She has never used smokeless tobacco. She reports that she does not drink alcohol and does not use drugs.  Allergies  Allergen Reactions   Codeine Nausea And Vomiting   Ether Nausea And Vomiting    Family History  Problem Relation Age of Onset   Heart disease Other        Parent   Cancer Maternal Uncle      Prior to Admission medications   Medication Sig Start Date End Date Taking? Authorizing Provider  atorvastatin (LIPITOR) 80 MG tablet Take 1 tablet by mouth once daily 09/24/20   Jearld Fenton, NP  bisacodyl (DULCOLAX) 5 MG EC tablet Take 2 tablets (10 mg total) by mouth daily as needed for moderate constipation. 04/24/18   Domenic Polite, MD  CALCIUM PO Take 1 tablet by mouth daily.    [provider]  cholecalciferol (VITAMIN D) 1000 units tablet Take 1,000 Units by mouth daily.    [provider]  clopidogrel (PLAVIX) 75 MG tablet Take 1 tablet by mouth in the evening 12/10/20   Jearld Fenton, NP  diltiazem (CARDIZEM CD) 180 MG 24 hr capsule Take 1 capsule by mouth once daily 10/11/20   Jearld Fenton, NP  escitalopram (LEXAPRO) 10 MG tablet Take 1 tablet by  mouth once daily 12/17/20   Jearld Fenton, NP  gabapentin (NEURONTIN) 300 MG capsule Take 1 capsule by mouth twice daily 09/24/20   Jearld Fenton, NP  hydrochlorothiazide (MICROZIDE) 12.5 MG capsule Take 1 capsule by mouth once daily 09/24/20   Jearld Fenton, NP  hydrocortisone (ANUSOL-HC) 2.5 % rectal cream Place rectally 2 (two) times daily as needed for hemorrhoids or anal itching. 04/24/18   Domenic Polite, MD  lisinopril (ZESTRIL) 10 MG tablet Take 1 tablet by mouth once daily 09/24/20   Jearld Fenton, NP  loratadine (CLARITIN) 10 MG tablet Take 10 mg by mouth daily.    [provider]  NYSTATIN powder APPLY  POWDER TOPICALLY TO AFFECTED AREA 4 TIMES DAILY  06/29/18   Jearld Fenton, NP  phenytoin (DILANTIN) 50 MG tablet Take 2 tablets by mouth once daily 09/09/20   Jearld Fenton, NP  polyethylene glycol (MIRALAX / GLYCOLAX) packet Take 17 g by mouth daily. 04/25/18   Domenic Polite, MD  potassium chloride SA (KLOR-CON) 20 MEQ tablet TAKE 1  BY MOUTH ONCE DAILY 12/10/20   Jearld Fenton, NP  senna-docusate (SENOKOT-S) 8.6-50 MG tablet Take 1 tablet by mouth 2 (two) times daily. 04/24/18   Domenic Polite, MD  traZODone (DESYREL) 50 MG tablet TAKE 3 TABLETS BY MOUTH AT BEDTIME 10/23/20   Jearld Fenton, NP  vitamin B-12 (CYANOCOBALAMIN) 1000 MCG tablet Take 1,000 mcg by mouth daily.    [provider]    Physical Exam: Vitals:   01/07/21 1415 01/07/21 1420 01/07/21 1445 01/07/21 1500  BP: (!) 134/41  (!) 134/43 (!) 157/57  Pulse: 65  61 61  Resp: 14  12 18   Temp:  98.5 F (36.9 C)    TempSrc:  Oral    SpO2: 99%  100% 100%  Weight:  108.4 kg    Height:  5\' 2"  (1.575 m)      Constitutional: NAD, calm, comfortable Vitals:   01/07/21 1415 01/07/21 1420 01/07/21 1445 01/07/21 1500  BP: (!) 134/41  (!) 134/43 (!) 157/57  Pulse: 65  61 61  Resp: 14  12 18   Temp:  98.5 F (36.9 C)    TempSrc:  Oral    SpO2: 99%  100% 100%  Weight:  108.4 kg    Height:  5\' 2"  (1.575 m)     Eyes: PERRL, lids and conjunctivae normal ENMT: Mucous membranes are moist. Posterior pharynx clear of any exudate or lesions.Normal dentition.  Neck: normal, supple, no masses, no thyromegaly Respiratory: clear to auscultation bilaterally, no wheezing, fine crackles on bilateral bases.  Normal respiratory effort. No accessory muscle use.  Cardiovascular: Regular rate and rhythm, loud blowing like systolic murmur on heart-based. 1+ extremity edema. 2+ pedal pulses. No carotid bruits.  Abdomen: no tenderness, no masses palpated. No hepatosplenomegaly. Bowel sounds positive.  Musculoskeletal: no clubbing / cyanosis. No joint deformity upper and lower  extremities. Good ROM, no contractures. Normal muscle tone.  Skin: no rashes, lesions, ulcers. No induration Neurologic: CN 2-12 grossly intact. Sensation intact, DTR normal. Strength 5/5 in all 4.  Psychiatric: Normal judgment and insight. Alert and oriented x 3. Normal mood.     Labs on Admission: I have personally reviewed following labs and imaging studies  CBC: Recent Labs  Lab 01/06/21 0943 01/07/21 1200  WBC 6.8 6.9  NEUTROABS 4,862  --   HGB 7.2* 7.0*  HCT 23.8* 23.5*  MCV 93.7 94.8  PLT 242 254  Basic Metabolic Panel: Recent Labs  Lab 01/07/21 1200  NA 137  K 4.8  CL 105  CO2 20*  GLUCOSE 102*  BUN 37*  CREATININE 1.85*  CALCIUM 8.7*   GFR: Estimated Creatinine Clearance: 26.7 mL/min (A) (by C-G formula based on SCr of 1.85 mg/dL (H)). Liver Function Tests: Recent Labs  Lab 01/07/21 1200  AST 17  ALT 11  ALKPHOS 67  BILITOT 0.4  PROT 6.1*  ALBUMIN 3.2*   No results for input(s): LIPASE, AMYLASE in the last 168 hours. No results for input(s): AMMONIA in the last 168 hours. Coagulation Profile: No results for input(s): INR, PROTIME in the last 168 hours. Cardiac Enzymes: No results for input(s): CKTOTAL, CKMB, CKMBINDEX, TROPONINI in the last 168 hours. BNP (last 3 results) No results for input(s): PROBNP in the last 8760 hours. HbA1C: No results for input(s): HGBA1C in the last 72 hours. CBG: No results for input(s): GLUCAP in the last 168 hours. Lipid Profile: No results for input(s): CHOL, HDL, LDLCALC, TRIG, CHOLHDL, LDLDIRECT in the last 72 hours. Thyroid Function Tests: No results for input(s): TSH, T4TOTAL, FREET4, T3FREE, THYROIDAB in the last 72 hours. Anemia Panel: Recent Labs    01/06/21 0943  FERRITIN 8*  TIBC 357  IRON 22*   Urine analysis:    Component Value Date/Time   COLORURINE AMBER (A) 08/13/2015 1107   APPEARANCEUR CLEAR 08/13/2015 1107   LABSPEC 1.024 08/13/2015 1107   PHURINE 5.0 08/13/2015 1107   GLUCOSEU  NEGATIVE 08/13/2015 1107   GLUCOSEU NEGATIVE 11/09/2012 1537   HGBUR NEGATIVE 08/13/2015 1107   BILIRUBINUR LARGE (A) 08/13/2015 1107   KETONESUR 15 (A) 08/13/2015 1107   PROTEINUR NEGATIVE 08/13/2015 1107   UROBILINOGEN 1.0 05/04/2015 1505   NITRITE POSITIVE (A) 08/13/2015 1107   LEUKOCYTESUR SMALL (A) 08/13/2015 1107    Radiological Exams on Admission: No results found.  EKG: Independently reviewed. Ordered.  Assessment/Plan Active Problems:   GI bleed  (please populate well all problems here in Problem List. (For example, if patient is on BP meds at home and you resume or decide to hold them, it is a problem that needs to be her. Same for CAD, COPD, HLD and so on)  Acute on chronic iron deficiency anemia -Suspect upper GI bleed, acute on chronic. -PPI twice daily -GI recommend hold Plavix and plan for EGD and colonoscopy on Friday. -Allow to eat granola but will monitor H&H tonight after transfusion. -Iron infusion x1.  HTN -Change long-acting Cardizem to every 6 hours, hold HCTZ and ACEI until H/H stabilized -As needed hydralazine.  Peripheral edema -Suspect diastolic CHF decompensation. -1 dose of Lasix today, monitor blood pressure, consider further diuresis if BP stable and H&H stable. -Check echocardiogram  Systolic murmur -Suspect aortic stenosis, probably mild, appears to have questionable angina.  Recommend outpatient cardiology follow-up.  Morbid obesity -BMI 43.  Patient appears to have no interest of calorie control.  Chronic pain syndrome -Continue gabapentin  DVT prophylaxis: SCD Code Status: Full Code Family Communication: Son-in-law Disposition Plan: Expect more than 2 midnight hospital stay for EGD and colonoscopy on Friday Consults called: [facility] Admission status: Tele admit   Lequita Halt MD [facility] Pager [phone]  01/07/2021, 4:24 PM

## 2021-01-07 NOTE — Consult Note (Addendum)
Consultation  Referring Provider: [facility] Primary Care Physician:  Jearld Fenton, NP Primary Gastroenterologist:  none  Reason for Consultation:  Iron deficiency anemia  HPI: Carol Page is a 85 y.o. female, who was sent by her PCP to the ER today after outpatient labs which were done because of weakness showed a hemoglobin of 7.2 yesterday.  Repeat labs today hemoglobin 7.0 hematocrit of 23.5. Patient had iron studies done yesterday as well showing serum iron of 22/TIBC 357/sat 6 and ferritin of 8  She is Hemoccult negative today  Review of chart shows labs from February 2022 hemoglobin 10.6 hematocrit of 31 MCV of 97 Labs September 2020 hemoglobin 11.4/hematocrit of 34.5.  Patient is on chronic Plavix.  No aspirin use, no NSAID use. She has history of prior CVA, hypertension, hyperlipidemia, and depression .  Patient is a poor historian, her son who is at bedside helps with history.  It sounds as if she is chronically debilitated, ambulates with a walker, and spends a good deal of time in bed.  They have noticed that she has had increased complaints of fatigue especially with ambulation very recently. Patient says she has not noticed any melena or blood in her stool other than what she has attributed to hemorrhoids in the past if she gets constipated.  She has noticed some darker stools recently.  Apparently she does have some difficulty with urinary incontinence and intermittent fecal incontinence. No complaints of abdominal pain, changes in appetite, no dysphagia or odynophagia. Colonoscopy has been discussed with the patient in the past but she has always declined.  She has been afraid of colonoscopy as she feared that she would not be sedated. Oral iron has been tried in the past but she cannot tolerate due to constipation.  Family history negative for colon cancer as far as they are aware.  Past Medical History:  Diagnosis Date   Allergy    Arthritis    Cellulitis  of left leg    Depression    History of blood transfusion    Hyperlipidemia    Hypertension    Pre-diabetes    Proctitis    Skin cancer of nose    Stroke [facility])    Ulcer    Urinary incontinence     Past Surgical History:  Procedure Laterality Date   BILATERAL OOPHORECTOMY  06/2012   ovarian cysts   SKIN CANCER EXCISION  2007   reomved from [place]      Prior to Admission medications   Medication Sig Start Date End Date Taking? Authorizing Provider  atorvastatin (LIPITOR) 80 MG tablet Take 1 tablet by mouth once daily 09/24/20   Jearld Fenton, NP  bisacodyl (DULCOLAX) 5 MG EC tablet Take 2 tablets (10 mg total) by mouth daily as needed for moderate constipation. 04/24/18   Domenic Polite, MD  CALCIUM PO Take 1 tablet by mouth daily.    [provider]  cholecalciferol (VITAMIN D) 1000 units tablet Take 1,000 Units by mouth daily.    [provider]  clopidogrel (PLAVIX) 75 MG tablet Take 1 tablet by mouth in the evening 12/10/20   Jearld Fenton, NP  diltiazem (CARDIZEM CD) 180 MG 24 hr capsule Take 1 capsule by mouth once daily 10/11/20   Jearld Fenton, NP  escitalopram (LEXAPRO) 10 MG tablet Take 1 tablet by mouth once daily 12/17/20   Jearld Fenton, NP  gabapentin (NEURONTIN) 300 MG  capsule Take 1 capsule by mouth twice daily 09/24/20   Jearld Fenton, NP  hydrochlorothiazide (MICROZIDE) 12.5 MG capsule Take 1 capsule by mouth once daily 09/24/20   Jearld Fenton, NP  hydrocortisone (ANUSOL-HC) 2.5 % rectal cream Place rectally 2 (two) times daily as needed for hemorrhoids or anal itching. 04/24/18   Domenic Polite, MD  lisinopril (ZESTRIL) 10 MG tablet Take 1 tablet by mouth once daily 09/24/20   Jearld Fenton, NP  loratadine (CLARITIN) 10 MG tablet Take 10 mg by mouth daily.    [provider]  NYSTATIN powder APPLY  POWDER TOPICALLY TO AFFECTED AREA 4 TIMES DAILY 06/29/18   Jearld Fenton, NP  phenytoin  (DILANTIN) 50 MG tablet Take 2 tablets by mouth once daily 09/09/20   Jearld Fenton, NP  polyethylene glycol (MIRALAX / GLYCOLAX) packet Take 17 g by mouth daily. 04/25/18   Domenic Polite, MD  potassium chloride SA (KLOR-CON) 20 MEQ tablet TAKE 1  BY MOUTH ONCE DAILY 12/10/20   Jearld Fenton, NP  senna-docusate (SENOKOT-S) 8.6-50 MG tablet Take 1 tablet by mouth 2 (two) times daily. 04/24/18   Domenic Polite, MD  traZODone (DESYREL) 50 MG tablet TAKE 3 TABLETS BY MOUTH AT BEDTIME 10/23/20   Jearld Fenton, NP  vitamin B-12 (CYANOCOBALAMIN) 1000 MCG tablet Take 1,000 mcg by mouth daily.    [provider]    Current Facility-Administered Medications  Medication Dose Route Frequency Provider Last Rate Last Admin   0.9 %  sodium chloride infusion  10 mL/hr Intravenous Once Providence Lanius A, PA-C       pantoprazole (PROTONIX) injection 40 mg  40 mg Intravenous Once Volanda Napoleon, PA-C       Current Outpatient Medications  Medication Sig Dispense Refill   atorvastatin (LIPITOR) 80 MG tablet Take 1 tablet by mouth once daily 90 tablet 3   bisacodyl (DULCOLAX) 5 MG EC tablet Take 2 tablets (10 mg total) by mouth daily as needed for moderate constipation. 30 tablet 0   CALCIUM PO Take 1 tablet by mouth daily.     cholecalciferol (VITAMIN D) 1000 units tablet Take 1,000 Units by mouth daily.     clopidogrel (PLAVIX) 75 MG tablet Take 1 tablet by mouth in the evening 90 tablet 0   diltiazem (CARDIZEM CD) 180 MG 24 hr capsule Take 1 capsule by mouth once daily 90 capsule 3   escitalopram (LEXAPRO) 10 MG tablet Take 1 tablet by mouth once daily 90 tablet 0   gabapentin (NEURONTIN) 300 MG capsule Take 1 capsule by mouth twice daily 180 capsule 3   hydrochlorothiazide (MICROZIDE) 12.5 MG capsule Take 1 capsule by mouth once daily 90 capsule 3   hydrocortisone (ANUSOL-HC) 2.5 % rectal cream Place rectally 2 (two) times daily as needed for hemorrhoids or anal itching. 30 g 0   lisinopril  (ZESTRIL) 10 MG tablet Take 1 tablet by mouth once daily 90 tablet 3   loratadine (CLARITIN) 10 MG tablet Take 10 mg by mouth daily.     NYSTATIN powder APPLY  POWDER TOPICALLY TO AFFECTED AREA 4 TIMES DAILY 45 g 2   phenytoin (DILANTIN) 50 MG tablet Take 2 tablets by mouth once daily 180 tablet 1   polyethylene glycol (MIRALAX / GLYCOLAX) packet Take 17 g by mouth daily. 14 each 0   potassium chloride SA (KLOR-CON) 20 MEQ tablet TAKE 1  BY MOUTH ONCE DAILY 90 tablet 0   senna-docusate (SENOKOT-S) 8.6-50 MG  tablet Take 1 tablet by mouth 2 (two) times daily.     traZODone (DESYREL) 50 MG tablet TAKE 3 TABLETS BY MOUTH AT BEDTIME 270 tablet 2   vitamin B-12 (CYANOCOBALAMIN) 1000 MCG tablet Take 1,000 mcg by mouth daily.      Allergies as of 01/07/2021 - Review Complete 01/07/2021  Allergen Reaction Noted   Codeine Nausea And Vomiting 11/09/2012   Ether Nausea And Vomiting 11/09/2012    Family History  Problem Relation Age of Onset   Heart disease Other        Parent   Cancer Maternal Uncle     Social History   Socioeconomic History   Marital status: Widowed    Spouse name: Not on file   Number of children: 4   Years of education: 12   Highest education level: Not on file  Occupational History   Occupation: Retired  Tobacco Use   Smoking status: Never   Smokeless tobacco: Never  Substance and Sexual Activity   Alcohol use: No   Drug use: No   Sexual activity: Never  Other Topics Concern   Not on file  Social History Narrative   Regular exercise-no   Caffeine Use-yes   Social Determinants of Health   Financial Resource Strain: Not on file  Food Insecurity: Not on file  Transportation Needs: Not on file  Physical Activity: Not on file  Stress: Not on file  Social Connections: Not on file  Intimate Partner Violence: Not on file    Review of Systems: Pertinent positive and negative review of systems were noted in the above HPI section.  All other review of systems  was otherwise negative.   Physical Exam: Vital signs in last 24 hours: Temp:  [98.5 F (36.9 C)-98.6 F (37 C)] 98.5 F (36.9 C) (06/21 1420) Pulse Rate:  [61-65] 61 (06/21 1500) Resp:  [12-18] 18 (06/21 1500) BP: (103-157)/(30-57) 157/57 (06/21 1500) SpO2:  [99 %-100 %] 100 % (06/21 1500) Weight:  [108.4 kg] 108.4 kg (06/21 1420)   General:   Alert,  Well-developed, somewhat chronically ill-appearing pale elderly white female, obese ,pleasant and cooperative in NAD Head:  Normocephalic and atraumatic. Eyes:  Sclera clear, no icterus.   Conjunctiva pale Ears:  Normal auditory acuity. Nose:  No deformity, discharge,  or lesions. Mouth:  No deformity or lesions.   Neck:  Supple; no masses or thyromegaly. Lungs:  Clear throughout to auscultation.   No wheezes, crackles, or rhonchi.  Heart:  Regular rate and rhythm; prominent systolic murmur Abdomen: Soft, obese, there is no focal tenderness, no palpable mass or hepatosplenomegaly limited by habitus Rectal: Not done, documented heme negative today Msk:  Symmetrical without gross deformities. . Pulses:  Normal pulses noted. Extremities: 1+ edema bilateral lower extremities to the shins Neurologic:  Alert and  oriented x4;  grossly normal neurologically. Skin:  Intact without significant lesions or rashes.. Psych:  Alert and cooperative. Normal mood and affect.  Intake/Output from previous day: No intake/output data recorded. Intake/Output this shift: No intake/output data recorded.  Lab Results: Recent Labs    01/06/21 0943 01/07/21 1200  WBC 6.8 6.9  HGB 7.2* 7.0*  HCT 23.8* 23.5*  PLT 242 254   BMET Recent Labs    01/07/21 1200  NA 137  K 4.8  CL 105  CO2 20*  GLUCOSE 102*  BUN 37*  CREATININE 1.85*  CALCIUM 8.7*   LFT Recent Labs    01/07/21 1200  PROT 6.1*  ALBUMIN 3.2*  AST 17  ALT 11  ALKPHOS 62  BILITOT 0.4   IMPRESSION:  #39 85 year old white female with profound iron deficiency anemia,  symptomatic with weakness and fatigue. She is currently heme-negative On chronic Plavix No prior GI evaluation  Suspect she has had chronic intermittent slow GI blood loss with 3-1/2 g drop in hemoglobin over the past 3 to 4 months. Rule out occult neoplasm, chronic gastropathy, Cameron erosions, AVMs  #2 history of CVA #3 chronic antiplatelet therapy-on Plavix alone #4 hypertension #5.  Hyperlipidemia #6.  Obesity #7.  Debilitation chronic #8 systolic murmur ?AS  Plan; transfuse 2 units of packed RBCs I think patient will benefit from IV Feraheme during this admission as she is intolerant to oral iron  Hold Plavix, last dose yesterday 01/06/2021  Discussed work-up with upper endoscopy and colonoscopy, patient initially reluctant but after explaining the prep process, and good sedation the patient is willing to proceed.  Would plan for procedures Friday with Dr. Carlean Purl.  As patient is debilitated I think she would have a difficult time prepping at home and will be best served by keeping her in the hospital until we can complete a work-up.  Consider 2D echo   GI will follow with you.     Amy Esterwood PA-C 01/07/2021, 3:19 PM  GI attending:  Severe iron deficiency anemia that is symptomatic.  She needs an EGD and a colonoscopy.  We do not have to wait till Friday, we can do this on Plavix there is some increased risk of bleeding but we have tools to mitigate that and I do not think letting her sit in the hospital 4 days and that the washout makes sense.  Await echocardiogram as well.  The risks and benefits as well as alternatives of endoscopic procedure(s) have been discussed and reviewed. All questions answered. The patient agrees to proceed.   Gatha Mayer, MD, [facility] 01/07/2021 5:50 PM

## 2021-01-07 NOTE — ED Notes (Signed)
Pt unable to sit or stand by her self unable to get orthostatic vitals at this time. Pt complete care. Family at the bedside.

## 2021-01-07 NOTE — ED Provider Notes (Signed)
[facility] Provider Note   CSN: [id number] Arrival date & time: 01/07/21  1142     History Chief Complaint  Patient presents with   GI Bleeding    Carol Page is a 85 y.o. female who presents for evaluation of abnormal lab value.  Patient saw her primary care doctor yesterday and was called today and told her hemoglobin was 7.2.  She was instructed to go the emergency department for further evaluation.  Patient reports that she has felt fatigued over the last several days.  She states she has felt like she has not had any energy energy.  She has not any chest pain or shortness of breath.  She does report that she has had some dark stools but she does not know how long this has been going on.  Her son-in-law is at bedside who states he helps change the bedside commode.  He has noted some darker stools but he is unsure of how long it has been going on.  No bright red blood.  Patient states she has not had any abdominal pain, nausea/vomiting.  She is currently on Plavix.  The history is provided by the patient and a relative.      Past Medical History:  Diagnosis Date   Allergy    Arthritis    Cellulitis of left leg    Depression    History of blood transfusion    Hyperlipidemia    Hypertension    Pre-diabetes    Proctitis    Skin cancer of nose    Stroke [facility])    Ulcer    Urinary incontinence     Patient Active Problem List   Diagnosis Date Noted   GI bleed 01/07/2021   Symptomatic anemia    Anxiety and depression 10/06/2013   Urinary incontinence 10/06/2013   Chronic leg pain 10/06/2013   History of CVA (cerebrovascular accident) 10/06/2013   Essential hypertension 11/09/2012   HLD (hyperlipidemia) 11/09/2012   Pre-diabetes 11/09/2012   Seizure disorder ([place]) 11/09/2012    Past Surgical History:  Procedure Laterality Date   BILATERAL OOPHORECTOMY  06/2012   ovarian cysts   SKIN CANCER EXCISION  2007   reomved from [facility]       OB History   No obstetric history on file.     Family History  Problem Relation Age of Onset   Heart disease Other        Parent   Cancer Maternal Uncle     Social History   Tobacco Use   Smoking status: Never   Smokeless tobacco: Never  Substance Use Topics   Alcohol use: No   Drug use: No    Home Medications Prior to Admission medications   Medication Sig Start Date End Date Taking? Authorizing Provider  atorvastatin (LIPITOR) 80 MG tablet Take 1 tablet by mouth once daily Patient taking differently: Take 80 mg by mouth daily. 09/24/20   Jearld Fenton, NP  bisacodyl (DULCOLAX) 5 MG EC tablet Take 2 tablets (10 mg total) by mouth daily as needed for moderate constipation. 04/24/18   Domenic Polite, MD  CALCIUM PO Take 1 tablet by mouth daily.    [provider]  cholecalciferol (VITAMIN D) 1000 units tablet Take 1,000 Units by mouth daily.    [provider]  clopidogrel (PLAVIX) 75 MG tablet Take 1 tablet by mouth in the evening Patient taking differently: Take 75  mg by mouth daily. 12/10/20   Jearld Fenton, NP  diltiazem (CARDIZEM CD) 180 MG 24 hr capsule Take 1 capsule by mouth once daily Patient taking differently: Take 180 mg by mouth daily. 10/11/20   Jearld Fenton, NP  escitalopram (LEXAPRO) 10 MG tablet Take 1 tablet by mouth once daily Patient taking differently: Take 10 mg by mouth daily. 12/17/20   Jearld Fenton, NP  gabapentin (NEURONTIN) 300 MG capsule Take 1 capsule by mouth twice daily Patient taking differently: Take 300 mg by mouth 2 (two) times daily. 09/24/20   Jearld Fenton, NP  hydrochlorothiazide (MICROZIDE) 12.5 MG capsule Take 1 capsule by mouth once daily Patient taking differently: Take 12.5 mg by mouth daily. 09/24/20   Jearld Fenton, NP  hydrocortisone (ANUSOL-HC) 2.5 % rectal cream Place rectally 2 (two) times daily as needed for hemorrhoids or anal itching. Patient taking  differently: Place 1 application rectally 2 (two) times daily as needed for hemorrhoids or anal itching. 04/24/18   Domenic Polite, MD  lisinopril (ZESTRIL) 10 MG tablet Take 1 tablet by mouth once daily Patient taking differently: Take 10 mg by mouth daily. 09/24/20   Jearld Fenton, NP  loratadine (CLARITIN) 10 MG tablet Take 10 mg by mouth daily.    [provider]  NYSTATIN powder APPLY  POWDER TOPICALLY TO AFFECTED AREA 4 TIMES DAILY Patient taking differently: Apply 1 application topically 4 (four) times daily. 06/29/18   Jearld Fenton, NP  phenytoin (DILANTIN) 50 MG tablet Take 2 tablets by mouth once daily Patient taking differently: Chew 100 mg by mouth daily. 09/09/20   Jearld Fenton, NP  polyethylene glycol (MIRALAX / GLYCOLAX) packet Take 17 g by mouth daily. 04/25/18   Domenic Polite, MD  potassium chloride SA (KLOR-CON) 20 MEQ tablet TAKE 1  BY MOUTH ONCE DAILY Patient taking differently: Take 20 mEq by mouth daily. 12/10/20   Jearld Fenton, NP  senna-docusate (SENOKOT-S) 8.6-50 MG tablet Take 1 tablet by mouth 2 (two) times daily. 04/24/18   Domenic Polite, MD  traZODone (DESYREL) 50 MG tablet TAKE 3 TABLETS BY MOUTH AT BEDTIME Patient taking differently: Take 150 mg by mouth at bedtime. 10/23/20   Jearld Fenton, NP  vitamin B-12 (CYANOCOBALAMIN) 1000 MCG tablet Take 1,000 mcg by mouth daily.    [provider]    Allergies    Codeine and Ether  Review of Systems   Review of Systems  Constitutional:  Positive for fatigue. Negative for fever.  Respiratory:  Negative for cough and shortness of breath.   Cardiovascular:  Negative for chest pain.  Gastrointestinal:  Negative for abdominal pain, nausea and vomiting.  Genitourinary:  Negative for dysuria and hematuria.  Neurological:  Positive for weakness (generalized). Negative for headaches.  All other systems reviewed and are negative.  Physical Exam Updated Vital Signs BP (!) 157/57   Pulse 61    Temp 98.5 F (36.9 C) (Oral)   Resp 18   Ht 5\' 2"  (1.575 m)   Wt 108.4 kg   SpO2 100%   BMI 43.71 kg/m   Physical Exam Vitals and nursing note reviewed.  Constitutional:      Appearance: Normal appearance. She is well-developed.  HENT:     Head: Normocephalic and atraumatic.  Eyes:     General: Lids are normal.     Conjunctiva/sclera: Conjunctivae normal.     Pupils: Pupils are equal, round, and reactive to light.  Cardiovascular:  Rate and Rhythm: Normal rate and regular rhythm.     Pulses: Normal pulses.     Heart sounds: Normal heart sounds. No murmur heard.   No friction rub. No gallop.  Pulmonary:     Effort: Pulmonary effort is normal.     Breath sounds: Normal breath sounds.     Comments: Lungs clear to auscultation bilaterally.  Symmetric chest rise.  No wheezing, rales, rhonchi. Abdominal:     Palpations: Abdomen is soft. Abdomen is not rigid.     Tenderness: There is no abdominal tenderness. There is no guarding.     Comments: Abdomen is soft, non-distended, non-tender. No rigidity, No guarding. No peritoneal signs.   Genitourinary:    Comments: The exam was performed with a chaperone present Lovena Le, RN). Digital Rectal Exam reveals sphincter with good tone. No external hemorrhoids. No masses or fissures.  She does have some evidence of darker stools around the buttock and in her underwear.  No obvious melena on my exam. Musculoskeletal:        General: Normal range of motion.     Cervical back: Full passive range of motion without pain.  Skin:    General: Skin is warm and dry.     Capillary Refill: Capillary refill takes less than 2 seconds.  Neurological:     Mental Status: She is alert and oriented to person, place, and time.  Psychiatric:        Speech: Speech normal.    ED Results / Procedures / Treatments   Labs (all labs ordered are listed, but only abnormal results are displayed) Labs Reviewed  COMPREHENSIVE METABOLIC PANEL - Abnormal; Notable  for the following components:      Result Value   CO2 20 (*)    Glucose, Bld 102 (*)    BUN 37 (*)    Creatinine, Ser 1.85 (*)    Calcium 8.7 (*)    Total Protein 6.1 (*)    Albumin 3.2 (*)    GFR, Estimated 27 (*)    All other components within normal limits  CBC - Abnormal; Notable for the following components:   RBC 2.48 (*)    Hemoglobin 7.0 (*)    HCT 23.5 (*)    MCHC 29.8 (*)    All other components within normal limits  HEMOGLOBIN AND HEMATOCRIT, BLOOD  POC OCCULT BLOOD, ED  TYPE AND SCREEN  ABO/RH  PREPARE RBC (CROSSMATCH)    EKG None  Radiology No results found.  Procedures .Critical Care  Date/Time: 01/07/2021 3:02 PM Performed by: Volanda Napoleon, PA-C Authorized by: Volanda Napoleon, PA-C   Critical care provider statement:    Critical care time (minutes):  35   Critical care was necessary to treat or prevent imminent or life-threatening deterioration of the following conditions: Symptomatic anemia.   Critical care was time spent personally by me on the following activities:  Discussions with consultants, evaluation of patient's response to treatment, examination of patient, ordering and performing treatments and interventions, ordering and review of laboratory studies, ordering and review of radiographic studies, pulse oximetry, re-evaluation of patient's condition, obtaining history from patient or surrogate and review of old charts   I assumed direction of critical care for this patient from another provider in my specialty: yes     Care discussed with: admitting provider     Medications Ordered in ED Medications  0.9 %  sodium chloride infusion (has no administration in time range)  pantoprazole (PROTONIX) injection 40 mg (  has no administration in time range)  atorvastatin (LIPITOR) tablet 80 mg (80 mg Oral Not Given 01/07/21 1622)  diltiazem (CARDIZEM) tablet 30 mg (has no administration in time range)  hydrochlorothiazide (MICROZIDE) capsule 12.5  mg (12.5 mg Oral Not Given 01/07/21 1623)  lisinopril (ZESTRIL) tablet 10 mg (10 mg Oral Not Given 01/07/21 1624)  hydrALAZINE (APRESOLINE) tablet 25 mg (has no administration in time range)  escitalopram (LEXAPRO) tablet 10 mg (10 mg Oral Not Given 01/07/21 1623)  traZODone (DESYREL) tablet 150 mg (has no administration in time range)  bisacodyl (DULCOLAX) EC tablet 10 mg (has no administration in time range)  polyethylene glycol (MIRALAX / GLYCOLAX) packet 17 g (has no administration in time range)  senna-docusate (Senokot-S) tablet 1 tablet (has no administration in time range)  vitamin B-12 (CYANOCOBALAMIN) tablet 1,000 mcg (has no administration in time range)  gabapentin (NEURONTIN) capsule 300 mg (has no administration in time range)  cholecalciferol (VITAMIN D3) tablet 1,000 Units (has no administration in time range)  potassium chloride (KLOR-CON) CR tablet 10 mEq (has no administration in time range)  loratadine (CLARITIN) tablet 10 mg (has no administration in time range)  hydrocortisone (ANUSOL-HC) 2.5 % rectal cream (has no administration in time range)  ferumoxytol (FERAHEME) 510 mg in sodium chloride 0.9 % 100 mL IVPB (has no administration in time range)  pantoprazole (PROTONIX) injection 40 mg (has no administration in time range)  furosemide (LASIX) injection 40 mg (has no administration in time range)    ED Course  I have reviewed the triage vital signs and the nursing notes.  Pertinent labs & imaging results that were available during my care of the patient were reviewed by me and considered in my medical decision making (see chart for details).    MDM Rules/Calculators/A&P                          85 year old female who presents for evaluation of low hemoglobin.  She saw her doctor the other day and had blood taken.  She was called today and told that her hemoglobin is 7.2.  She was told to come to the emergency department.  She states she has had some dark stools but  does not know how long this has been.  She also reports feeling fatigued, tired.  No chest pain, difficulty breathing.  On initial arrival, she is afebrile, nontoxic-appearing.  Vital signs are stable.  No abdominal tenderness.  She reports she does have history of ulcers and she takes Plavix.  Question of this is from bleeding versus other GI bleed.  She has never had a colonoscopy.  We will plan to repeat labs here.  CBC shows no leukocytosis.  Hemoglobin is 7.0.  CMP shows BUN of 37, creatinine of 1.85.  Review of her record shows that her hemoglobin was 7.2 yesterday.  4 months ago, was 10.6.  It looks like she fluctuates between 10-11.  I discussed with family.  Patient has not had a colonoscopy.  She does not follow with GI.  At this time, given downtrending hemoglobin, concern for dark stools as well as symptomatic anemia, will plan for transfusion, admission.  Patient is seen with our GI during admission but she has not followed up with any outpatient GI.  Will consult on-call GI for further management of her symptoms.  Discussed patient with Edward Jolly (GI PA) who will plan to see the patient in consultation. Recommends holding Plavix.   Discussed  patient with Dr. Roosevelt Locks [facility]) who accepts patient for admission.   Portions of this note were generated with [employer]. Dictation errors may occur despite best attempts at proofreading.   Final Clinical Impression(s) / ED Diagnoses Final diagnoses:  Symptomatic anemia    Rx / DC Orders ED Discharge Orders     None        Desma Mcgregor 01/07/21 1647    Noemi Chapel, MD 01/08/21 1029

## 2021-01-07 NOTE — ED Triage Notes (Signed)
Pt sent here for blood transfusion as her Hgb is 7.2. Reports black, tarry stool but is unable to give timeframe for same. Pt denies complaints.

## 2021-01-07 NOTE — ED Provider Notes (Signed)
Emergency Medicine Provider Triage Evaluation Note  Carol Page 85 y.o. female was evaluated in triage.  Pt complains of presents for evaluation of abnormal lab, fatigue.  Her hemoglobin was found to be 7.2.  She sent to the ED for further evaluation.  She reports that over the last couple days, she has had black tarry stools.  She is on a blood thinner.  She has a history of ulcers.  She has not had any abdominal pain, chest pain, difficulty breathing.  She reports she has been fatigued.   Review of Systems  Positive: Melena, fatigue Negative: CP, SOB  Physical Exam  BP 134/82   Pulse 70   Temp 98.2 F (36.8 C) (Oral)   Resp 18   Ht 5\' 4"  (1.626 m)   Wt 65.8 kg   SpO2 100%   BMI 24.89 kg/m  Gen:   Awake, no distress   HEENT:  Atraumatic  Resp:  Normal effort  Cardiac:  Normal rate  Abd:   Nondistended, nontender  MSK:   Moves extremities without difficulty  Neuro:  Speech clear   Other:      Medical Decision Making  Medically screening exam initiated at 11:58 AM  Appropriate orders placed.  Carol Page was informed that the remainder of the evaluation will be completed by another provider, this initial triage assessment does not replace that evaluation. They are counseled that they will need to remain in the ED until the completion of their workup, including full H&P and results of any tests.  Risks of leaving the emergency department prior to completion of treatment were discussed. Patient was advised to inform ED staff if they are leaving before their treatment is complete. The patient acknowledged these risks and time was allowed for questions.     The patient appears stable so that the remainder of the MSE may be completed by another provider.    Clinical Impression  Abnormal lab   Portions of this note were generated with Dragon dictation software. Dictation errors may occur despite best attempts at proofreading.     Volanda Napoleon, PA-C 01/07/21 1159     Noemi Chapel, MD 01/08/21 1029

## 2021-01-08 ENCOUNTER — Inpatient Hospital Stay (HOSPITAL_COMMUNITY): Payer: Medicare Other

## 2021-01-08 DIAGNOSIS — D649 Anemia, unspecified: Secondary | ICD-10-CM

## 2021-01-08 DIAGNOSIS — R011 Cardiac murmur, unspecified: Secondary | ICD-10-CM

## 2021-01-08 DIAGNOSIS — N183 Chronic kidney disease, stage 3 unspecified: Secondary | ICD-10-CM | POA: Diagnosis present

## 2021-01-08 DIAGNOSIS — N1832 Chronic kidney disease, stage 3b: Secondary | ICD-10-CM

## 2021-01-08 DIAGNOSIS — I351 Nonrheumatic aortic (valve) insufficiency: Secondary | ICD-10-CM | POA: Diagnosis not present

## 2021-01-08 DIAGNOSIS — E782 Mixed hyperlipidemia: Secondary | ICD-10-CM

## 2021-01-08 DIAGNOSIS — I10 Essential (primary) hypertension: Secondary | ICD-10-CM

## 2021-01-08 DIAGNOSIS — G40909 Epilepsy, unspecified, not intractable, without status epilepticus: Secondary | ICD-10-CM

## 2021-01-08 DIAGNOSIS — D509 Iron deficiency anemia, unspecified: Secondary | ICD-10-CM

## 2021-01-08 DIAGNOSIS — N179 Acute kidney failure, unspecified: Secondary | ICD-10-CM

## 2021-01-08 DIAGNOSIS — I350 Nonrheumatic aortic (valve) stenosis: Secondary | ICD-10-CM | POA: Diagnosis not present

## 2021-01-08 LAB — TYPE AND SCREEN
ABO/RH(D): A NEG
Antibody Screen: NEGATIVE
Unit division: 0
Unit division: 0

## 2021-01-08 LAB — CBC
HCT: 33.1 % — ABNORMAL LOW (ref 36.0–46.0)
Hemoglobin: 10.6 g/dL — ABNORMAL LOW (ref 12.0–15.0)
MCH: 28.6 pg (ref 26.0–34.0)
MCHC: 32 g/dL (ref 30.0–36.0)
MCV: 89.5 fL (ref 80.0–100.0)
Platelets: 245 10*3/uL (ref 150–400)
RBC: 3.7 MIL/uL — ABNORMAL LOW (ref 3.87–5.11)
RDW: 15.1 % (ref 11.5–15.5)
WBC: 12 10*3/uL — ABNORMAL HIGH (ref 4.0–10.5)
nRBC: 0 % (ref 0.0–0.2)

## 2021-01-08 LAB — BPAM RBC
Blood Product Expiration Date: 202207052359
Blood Product Expiration Date: 202207052359
ISSUE DATE / TIME: 202206211707
ISSUE DATE / TIME: 202206212033
Unit Type and Rh: 600
Unit Type and Rh: 600

## 2021-01-08 LAB — BASIC METABOLIC PANEL
Anion gap: 10 (ref 5–15)
BUN: 42 mg/dL — ABNORMAL HIGH (ref 8–23)
CO2: 18 mmol/L — ABNORMAL LOW (ref 22–32)
Calcium: 8.5 mg/dL — ABNORMAL LOW (ref 8.9–10.3)
Chloride: 108 mmol/L (ref 98–111)
Creatinine, Ser: 2.3 mg/dL — ABNORMAL HIGH (ref 0.44–1.00)
GFR, Estimated: 21 mL/min — ABNORMAL LOW (ref >60)
Glucose, Bld: 126 mg/dL — ABNORMAL HIGH (ref 70–99)
Potassium: 5.2 mmol/L — ABNORMAL HIGH (ref 3.5–5.1)
Sodium: 136 mmol/L (ref 135–145)

## 2021-01-08 LAB — ECHOCARDIOGRAM COMPLETE
AR max vel: 1.76 cm2
AV Area VTI: 1.81 cm2
AV Area mean vel: 1.82 cm2
AV Mean grad: 15 mmHg
AV Peak grad: 25.8 mmHg
Ao pk vel: 2.54 m/s
Area-P 1/2: 1.79 cm2
Height: 62 in
MV VTI: 1.97 cm2
S' Lateral: 3 cm
Weight: 3823.66 oz

## 2021-01-08 LAB — POTASSIUM: Potassium: 4.3 mmol/L (ref 3.5–5.1)

## 2021-01-08 MED ORDER — PEG-KCL-NACL-NASULF-NA ASC-C 100 G PO SOLR: 1.0000 | Freq: Once | ORAL | Status: DC

## 2021-01-08 MED ORDER — PEG-KCL-NACL-NASULF-NA ASC-C 100 G PO SOLR
0.5000 | Freq: Once | ORAL | Status: AC
  Administered 2021-01-08: 100 g via ORAL
  Filled 2021-01-08: qty 1

## 2021-01-08 MED ORDER — SODIUM ZIRCONIUM CYCLOSILICATE 10 G PO PACK
10.0000 g | PACK | Freq: Once | ORAL | Status: DC
  Filled 2021-01-08: qty 1

## 2021-01-08 MED ORDER — FENTANYL CITRATE (PF) 100 MCG/2ML IJ SOLN
25.0000 ug | INTRAMUSCULAR | Status: DC | PRN
  Administered 2021-01-08: 25 ug via INTRAVENOUS
  Filled 2021-01-08: qty 2

## 2021-01-08 MED ORDER — LACTATED RINGERS IV SOLN: INTRAVENOUS | Status: DC

## 2021-01-08 NOTE — ED Notes (Signed)
Pt pulled up and sat up in bed. Breakfast tray provided. Pt verbalized comfort while eating

## 2021-01-08 NOTE — ED Notes (Signed)
Breakfast order placed ?

## 2021-01-08 NOTE — ED Notes (Signed)
Phlebotomy unable to obtain sufficient blood for morning labs. Pt refusing more attempts. Small amount collected and sent to lab.

## 2021-01-08 NOTE — Progress Notes (Addendum)
Patient ID: Carol Page, female   DOB: January 01, 1936, 85 y.o.   MRN: [id number]    Progress Note   Subjective   Day # 2  CC; weakness, severe iron deficiency anemia  Hemoglobin up to 10.6 status post 2 units packed RBCs Creatinine 2.3-1.85 on admit  To receive Venofer today 2D echo pending  Plavix on hold   Patient feels fine this morning, says she is feeling better after transfusions, in good spirits, had solid breakfast.     Objective   Vital signs in last 24 hours: Temp:  [97.8 F (36.6 C)-98.6 F (37 C)] 98.2 F (36.8 C) (06/22 0721) Pulse Rate:  [59-75] 75 (06/22 0806) Resp:  [12-20] 19 (06/22 0806) BP: (103-190)/(30-98) 151/97 (06/22 1000) SpO2:  [90 %-100 %] 92 % (06/22 0806) Weight:  [108.4 kg] 108.4 kg (06/21 1420)   General:   Elderly white female in NAD Heart:  Regular rate and rhythm; prominent systolic murmur Lungs: Respirations even and unlabored, lungs CTA bilaterally Abdomen:  Soft, nontender and nondistended. Normal bowel sounds. Extremities: Trace edema Neurologic:  Alert and oriented,  grossly normal neurologically. Psych:  Cooperative. Normal mood and affect.  Intake/Output from previous day: 06/21 0701 - 06/22 0700 In: 415 [I.V.:415] Out: -  Intake/Output this shift: No intake/output data recorded.  Lab Results: Recent Labs    01/06/21 0943 01/07/21 1200 01/08/21 0645  WBC 6.8 6.9 12.0*  HGB 7.2* 7.0* 10.6*  HCT 23.8* 23.5* 33.1*  PLT 242 254 245   BMET Recent Labs    01/07/21 1200 01/08/21 0444  NA 137 136  K 4.8 5.2*  CL 105 108  CO2 20* 18*  GLUCOSE 102* 126*  BUN 37* 42*  CREATININE 1.85* 2.30*  CALCIUM 8.7* 8.5*   LFT Recent Labs    01/07/21 1200  PROT 6.1*  ALBUMIN 3.2*  AST 17  ALT 11  ALKPHOS 67  BILITOT 0.4   PT/INR No results for input(s): LABPROT, INR in the last 72 hours.  Studies/Results: DG Chest 1 View  Result Date: 01/07/2021 CLINICAL DATA:  Anemia EXAM: CHEST  1 VIEW COMPARISON:  Portable  exam 1635 hours compared to 08/19/2015 FINDINGS: Borderline enlargement of cardiac silhouette with mitral annular calcification. Mediastinal contours and pulmonary vascularity normal. Atherosclerotic calcification aorta. Atelectasis versus consolidation RIGHT middle lobe with loss of RIGHT heart border. Remaining lungs clear. No pleural effusion or pneumothorax. Bones demineralized. IMPRESSION: Atelectasis versus consolidation in RIGHT middle lobe. Electronically Signed   By: Lavonia Dana M.D.   On: 01/07/2021 16:50       Assessment / Plan:    #59 85 year old white female presenting with progressive fatigue and weakness, and found to have severe iron deficiency anemia with hemoglobin of 7 in setting of chronic Plavix. Heme-negative on admission No prior GI evaluation  She has been stable, received 2 units of packed RBCs and feels better To receive IV iron today  Last dose of Plavix was 01/06/2021  #2 status post CVA #3 chronic debilitation #4 mild acute kidney injury  Plan is for bowel prep later today, n.p.o. after midnight, and patient has been scheduled for EGD and colonoscopy with Dr. Carlean Purl tomorrow.  Procedures have been discussed in detail with the patient including indications risks and benefits and she is agreeable to proceed.  Await 2D echo   Active Problems:   GI bleed     LOS: 1 day   Amy Esterwood PA-C 01/08/2021, 10:42 AM     [facility] Attending  I have taken an interval history, reviewed the chart and examined the patient. I agree with the Advanced Practitioner's note, impression and recommendations.  Gatha Mayer, MD, [facility] 01/08/2021 5:04 PM

## 2021-01-08 NOTE — ED Notes (Signed)
Pt refuse labs x2

## 2021-01-08 NOTE — ED Notes (Signed)
CBC needs to be recollected per lab tech. Standing order place for recollection

## 2021-01-08 NOTE — ED Notes (Signed)
Pt refused second attempt for CBC by Lab tech Larene Beach. Pt re-educated in important of monitoring CBC following need for blood transfusion. Pt verbalized understanding. Pt continues to refuse.

## 2021-01-08 NOTE — Progress Notes (Signed)
  Echocardiogram 2D Echocardiogram has been performed.  Ashli Selders G Chameka Mcmullen 01/08/2021, 3:00 PM

## 2021-01-08 NOTE — ED Notes (Signed)
Lab states recollection of labs required

## 2021-01-08 NOTE — ED Notes (Signed)
Pt states she will be complaint with another phlebotomist to attempt

## 2021-01-08 NOTE — Progress Notes (Signed)
PROGRESS NOTE    Carol Page  KGU:[id number] DOB: 1935-11-10 DOA: 01/07/2021 PCP: Jearld Fenton, NP    Brief Narrative:  85 y/o female admitted to the hospital with fatigue and worsening anemia. She was transfused prbc with improvement in symptoms. GI following for EGD/colonoscopy   Assessment & Plan:   Active Problems:   Essential hypertension   HLD (hyperlipidemia)   Seizure disorder (HCC)   AKI (acute kidney injury) ([place])   GI bleed   Absolute anemia   CKD (chronic kidney disease) stage 3, GFR 30-59 ml/min (HCC)   Obesity, Class III, BMI 40-49.9 (morbid obesity) (HCC)   Acute on chronic anemia -concern for underlying GI bleed -she was transfused 2 unit prbc -baseline hemoglobin around 10 -she was admitted with hemoglobin of 7 -post transfusion hemoglobin improved to 10.6 -plan for EGD/colonoscopy tomorrow -continue PPI BID  AKI on CKD 3b -baseline creatinine 1.3 -on admission, creatinine 1.8->2.3 -hold lisinopril and HCTZ -provide gentle fluids overnight -continue to follow  Seizure disorder -continue on dilantin  HTN -lisinopril/hctz currently on hold -diltiazem changed to short acting -BP currently stable, continue to monitor  HLD -continue statin  Obesity, Class III BMI 43.7 -increased risk of morbidity and mortality  History of CVA -on chronic aspirin and plavix -these have been held in light of concerns for bleeding -resume when felt safe to do so  Peripheral edema -suspect that she may have an element of chronic edema from venous stasis -echo shows normal EF -currently leg swelling appears to be near baseline -continue to monitor     DVT prophylaxis: SCDs Start: 01/07/21 1557  Code Status: full code Family Communication: discussed with patient Disposition Plan: Status is: Inpatient  Remains inpatient appropriate because:Ongoing diagnostic testing needed not appropriate for outpatient work up and Inpatient level of care  appropriate due to severity of illness  Dispo: The patient is from: Home              Anticipated d/c is to: Home              Patient currently is not medically stable to d/c.   Difficult to place patient No         Consultants:  GI  Procedures:  Echo, preserved EF, grade 1 diastolic dysfunction  Antimicrobials:      Subjective: She denies having any bowel movements since coming to the ED. She does not have any chest pain or shortness of breath at present  Objective: Vitals:   01/08/21 1530 01/08/21 1545 01/08/21 1600 01/08/21 1715  BP: (!) 153/50 (!) 139/51 (!) 134/102 125/77  Pulse: 65 64 73 76  Resp: 20 (!) 38 (!) 21 17  Temp:      TempSrc:      SpO2: (!) 87% (!) 86% 90% 92%  Weight:      Height:        Intake/Output Summary (Last 24 hours) at 01/08/2021 1928 Last data filed at 01/08/2021 1112 Gross per 24 hour  Intake 515 ml  Output --  Net 515 ml   Filed Weights   01/07/21 1420  Weight: 108.4 kg    Examination:  General exam: Appears calm and comfortable  Respiratory system: Clear to auscultation. Respiratory effort normal. Cardiovascular system: S1 & S2 heard, RRR. No JVD, murmurs, rubs, gallops or clicks. No pedal edema. Gastrointestinal system: Abdomen is nondistended, soft and nontender. No organomegaly or masses felt. Normal bowel sounds heard. Central nervous system: Alert and oriented. No focal neurological deficits.  Extremities: Symmetric 5 x 5 power. Skin: No rashes, lesions or ulcers Psychiatry: Judgement and insight appear normal. Mood & affect appropriate.     Data Reviewed: I have personally reviewed following labs and imaging studies  CBC: Recent Labs  Lab 01/06/21 0943 01/07/21 1200 01/08/21 0645  WBC 6.8 6.9 12.0*  NEUTROABS 4,862  --   --   HGB 7.2* 7.0* 10.6*  HCT 23.8* 23.5* 33.1*  MCV 93.7 94.8 89.5  PLT 242 254 071   Basic Metabolic Panel: Recent Labs  Lab 01/07/21 1200 01/08/21 0444  NA 137 136  K 4.8 5.2*   CL 105 108  CO2 20* 18*  GLUCOSE 102* 126*  BUN 37* 42*  CREATININE 1.85* 2.30*  CALCIUM 8.7* 8.5*   GFR: Estimated Creatinine Clearance: 21.5 mL/min (A) (by C-G formula based on SCr of 2.3 mg/dL (H)). Liver Function Tests: Recent Labs  Lab 01/07/21 1200  AST 17  ALT 11  ALKPHOS 67  BILITOT 0.4  PROT 6.1*  ALBUMIN 3.2*   No results for input(s): LIPASE, AMYLASE in the last 168 hours. No results for input(s): AMMONIA in the last 168 hours. Coagulation Profile: No results for input(s): INR, PROTIME in the last 168 hours. Cardiac Enzymes: No results for input(s): CKTOTAL, CKMB, CKMBINDEX, TROPONINI in the last 168 hours. BNP (last 3 results) No results for input(s): PROBNP in the last 8760 hours. HbA1C: No results for input(s): HGBA1C in the last 72 hours. CBG: No results for input(s): GLUCAP in the last 168 hours. Lipid Profile: No results for input(s): CHOL, HDL, LDLCALC, TRIG, CHOLHDL, LDLDIRECT in the last 72 hours. Thyroid Function Tests: No results for input(s): TSH, T4TOTAL, FREET4, T3FREE, THYROIDAB in the last 72 hours. Anemia Panel: Recent Labs    01/06/21 0943  FERRITIN 8*  TIBC 357  IRON 22*   Sepsis Labs: No results for input(s): PROCALCITON, LATICACIDVEN in the last 168 hours.  No results found for this or any previous visit (from the past 240 hour(s)).       Radiology Studies: DG Chest 1 View  Result Date: 01/07/2021 CLINICAL DATA:  Anemia EXAM: CHEST  1 VIEW COMPARISON:  Portable exam 1635 hours compared to 08/19/2015 FINDINGS: Borderline enlargement of cardiac silhouette with mitral annular calcification. Mediastinal contours and pulmonary vascularity normal. Atherosclerotic calcification aorta. Atelectasis versus consolidation RIGHT middle lobe with loss of RIGHT heart border. Remaining lungs clear. No pleural effusion or pneumothorax. Bones demineralized. IMPRESSION: Atelectasis versus consolidation in RIGHT middle lobe. Electronically Signed    By: Lavonia Dana M.D.   On: 01/07/2021 16:50   ECHOCARDIOGRAM COMPLETE  Result Date: 01/08/2021    ECHOCARDIOGRAM REPORT   Patient Name:   SHENAYA LEBO Date of Exam: 01/08/2021 Medical Rec #:  [id number]      Height:       62.0 in Accession #:    [id number]     Weight:       239.0 lb Date of Birth:  [birth year]       BSA:          2.062 m Patient Age:    63 years       BP:           76/62 mmHg Patient Gender: F              HR:           67 bpm. Exam Location:  Inpatient Procedure: 2D Echo, Cardiac Doppler and Color Doppler Indications:    R01.1  Murmur  History:        Patient has no prior history of Echocardiogram examinations.                 Stroke; Risk Factors:Hypertension and Dyslipidemia.  Sonographer:    Jonelle Sidle Dance Referring Phys: [id number] [place]  1. Left ventricular ejection fraction, by estimation, is 65 to 70%. The left ventricle has normal function. The left ventricle has no regional wall motion abnormalities. Left ventricular diastolic parameters are consistent with Grade I diastolic dysfunction (impaired relaxation). Elevated left ventricular end-diastolic pressure.  2. Right ventricular systolic function is normal. The right ventricular size is normal. There is normal pulmonary artery systolic pressure.  3. Left atrial size was mildly dilated.  4. The mitral valve is normal in structure. No evidence of mitral valve regurgitation. No evidence of mitral stenosis. Severe mitral annular calcification.  5. Partial fusion of the left and non-coronary cusps. The aortic valve is calcified. Aortic valve regurgitation is mild to moderate. Mild aortic valve stenosis.  6. The inferior vena cava is normal in size with greater than 50% respiratory variability, suggesting right atrial pressure of 3 mmHg. FINDINGS  Left Ventricle: Left ventricular ejection fraction, by estimation, is 65 to 70%. The left ventricle has normal function. The left ventricle has no regional wall motion abnormalities.  The left ventricular internal cavity size was normal in size. There is  no left ventricular hypertrophy. Left ventricular diastolic parameters are consistent with Grade I diastolic dysfunction (impaired relaxation). Elevated left ventricular end-diastolic pressure. Right Ventricle: The right ventricular size is normal. No increase in right ventricular wall thickness. Right ventricular systolic function is normal. There is normal pulmonary artery systolic pressure. The tricuspid regurgitant velocity is 2.55 m/s, and  with an assumed right atrial pressure of 3 mmHg, the estimated right ventricular systolic pressure is 97.3 mmHg. Left Atrium: Left atrial size was mildly dilated. Right Atrium: Right atrial size was normal in size. Pericardium: There is no evidence of pericardial effusion. Mitral Valve: The mitral valve is normal in structure. Severe mitral annular calcification. No evidence of mitral valve regurgitation. No evidence of mitral valve stenosis. MV peak gradient, 8.6 mmHg. The mean mitral valve gradient is 4.0 mmHg. Tricuspid Valve: The tricuspid valve is normal in structure. Tricuspid valve regurgitation is trivial. No evidence of tricuspid stenosis. Aortic Valve: Partial fusion of the left and non-coronary cusps. The aortic valve is calcified. Aortic valve regurgitation is mild to moderate. Mild aortic stenosis is present. Aortic valve mean gradient measures 15.0 mmHg. Aortic valve peak gradient measures 25.8 mmHg. Aortic valve area, by VTI measures 1.81 cm. Pulmonic Valve: The pulmonic valve was normal in structure. Pulmonic valve regurgitation is not visualized. No evidence of pulmonic stenosis. Aorta: The aortic root is normal in size and structure. Venous: The inferior vena cava is normal in size with greater than 50% respiratory variability, suggesting right atrial pressure of 3 mmHg. IAS/Shunts: No atrial level shunt detected by color flow Doppler.  LEFT VENTRICLE PLAX 2D LVIDd:         4.90 cm   Diastology LVIDs:         3.00 cm  LV e' medial:    5.47 cm/s LV PW:         1.00 cm  LV E/e' medial:  23.9 LV IVS:        1.00 cm  LV e' lateral:   6.84 cm/s LVOT diam:     2.00 cm  LV E/e'  lateral: 19.2 LV SV:         101 LV SV Index:   49 LVOT Area:     3.14 cm  RIGHT VENTRICLE             IVC RV Basal diam:  2.90 cm     IVC diam: 1.90 cm RV S prime:     16.30 cm/s TAPSE (M-mode): 2.5 cm LEFT ATRIUM             Index       RIGHT ATRIUM           Index LA diam:        3.60 cm 1.75 cm/m  RA Area:     15.80 cm LA Vol (A2C):   77.6 ml 37.63 ml/m RA Volume:   36.50 ml  17.70 ml/m LA Vol (A4C):   58.7 ml 28.47 ml/m LA Biplane Vol: 68.3 ml 33.12 ml/m  AORTIC VALVE AV Area (Vmax):    1.76 cm AV Area (Vmean):   1.82 cm AV Area (VTI):     1.81 cm AV Vmax:           254.00 cm/s AV Vmean:          162.800 cm/s AV VTI:            0.559 m AV Peak Grad:      25.8 mmHg AV Mean Grad:      15.0 mmHg LVOT Vmax:         142.50 cm/s LVOT Vmean:        94.350 cm/s LVOT VTI:          0.322 m LVOT/AV VTI ratio: 0.58  AORTA Ao Root diam: 3.30 cm Ao Asc diam:  2.80 cm MITRAL VALVE                TRICUSPID VALVE MV Area (PHT): 1.79 cm     TR Peak grad:   26.0 mmHg MV Area VTI:   1.97 cm     TR Vmax:        255.00 cm/s MV Peak grad:  8.6 mmHg MV Mean grad:  4.0 mmHg     SHUNTS MV Vmax:       1.47 m/s     Systemic VTI:  0.32 m MV Vmean:      95.7 cm/s    Systemic Diam: 2.00 cm MV Decel Time: 423 msec MV E velocity: 131.00 cm/s MV A velocity: 143.00 cm/s MV E/A ratio:  0.92 Skeet Latch MD Electronically signed by Skeet Latch MD Signature Date/Time: 01/08/2021/4:56:24 PM    Final         Scheduled Meds:  atorvastatin  80 mg Oral Daily   cholecalciferol  1,000 Units Oral Daily   diltiazem  30 mg Oral Q6H   escitalopram  10 mg Oral Daily   gabapentin  300 mg Oral BID   loratadine  10 mg Oral Daily   pantoprazole (PROTONIX) IV  40 mg Intravenous Q12H   peg 3350 powder  0.5 kit Oral Once   senna-docusate  1  tablet Oral BID   sodium zirconium cyclosilicate  10 g Oral Once   traZODone  150 mg Oral QHS   vitamin B-12  1,000 mcg Oral Daily   Continuous Infusions:  iron sucrose Stopped (01/08/21 1112)     LOS: 1 day    Time spent: 75mns    JKathie Dike MD [facility]   If 7PM-7AM, please contact night-coverage [email or web address]  01/08/2021, 7:28 PM

## 2021-01-08 NOTE — ED Notes (Signed)
Informed hospitalist Dr. Myna Hidalgo pt c/o left lower abdominal pain rating 9 out of 10 with tenderness with palpation. Orders for fentanyl placed.

## 2021-01-09 ENCOUNTER — Inpatient Hospital Stay (HOSPITAL_COMMUNITY): Payer: Medicare Other

## 2021-01-09 DIAGNOSIS — K31819 Angiodysplasia of stomach and duodenum without bleeding: Secondary | ICD-10-CM

## 2021-01-09 LAB — CBC
HCT: 27 % — ABNORMAL LOW (ref 36.0–46.0)
Hemoglobin: 8.8 g/dL — ABNORMAL LOW (ref 12.0–15.0)
MCH: 28.8 pg (ref 26.0–34.0)
MCHC: 32.6 g/dL (ref 30.0–36.0)
MCV: 88.2 fL (ref 80.0–100.0)
Platelets: 188 10*3/uL (ref 150–400)
RBC: 3.06 MIL/uL — ABNORMAL LOW (ref 3.87–5.11)
RDW: 15 % (ref 11.5–15.5)
WBC: 10.5 10*3/uL (ref 4.0–10.5)
nRBC: 0 % (ref 0.0–0.2)

## 2021-01-09 LAB — RENAL FUNCTION PANEL
Albumin: 2.6 g/dL — ABNORMAL LOW (ref 3.5–5.0)
Anion gap: 8 (ref 5–15)
BUN: 39 mg/dL — ABNORMAL HIGH (ref 8–23)
CO2: 23 mmol/L (ref 22–32)
Calcium: 7.8 mg/dL — ABNORMAL LOW (ref 8.9–10.3)
Chloride: 106 mmol/L (ref 98–111)
Creatinine, Ser: 2.58 mg/dL — ABNORMAL HIGH (ref 0.44–1.00)
GFR, Estimated: 18 mL/min — ABNORMAL LOW (ref >60)
Glucose, Bld: 101 mg/dL — ABNORMAL HIGH (ref 70–99)
Phosphorus: 4 mg/dL (ref 2.5–4.6)
Potassium: 4.1 mmol/L (ref 3.5–5.1)
Sodium: 137 mmol/L (ref 135–145)

## 2021-01-09 LAB — RESP PANEL BY RT-PCR (FLU A&B, COVID) ARPGX2
Influenza A by PCR: NEGATIVE
Influenza B by PCR: NEGATIVE
SARS Coronavirus 2 by RT PCR: NEGATIVE

## 2021-01-09 LAB — SARS CORONAVIRUS 2 (TAT 6-24 HRS): SARS Coronavirus 2: NEGATIVE

## 2021-01-09 MED ORDER — PEG-KCL-NACL-NASULF-NA ASC-C 100 G PO SOLR
1.0000 | Freq: Once | ORAL | Status: AC
  Administered 2021-01-09: 200 g via ORAL
  Filled 2021-01-09: qty 1

## 2021-01-09 NOTE — Evaluation (Signed)
Physical Therapy Evaluation Patient Details Name: Carol Page MRN: [id number] DOB: 1935/11/25 Today's Date: 01/09/2021   History of Present Illness  85 y.o. female presented to ED 01/07/21 on direction of her PCP after drop in her hemoglobin noted in blood work. In ED pt received iron infusion and 2 units of PRBC. Scheduled for EGD and colonoscopy.  PMH: Cameron ulcer, HTN, remote history of CVA on Plavix, HLD, hemorrhoids, chronic urinary incontinence, anxiety/depression, morbid obesity,  Clinical Impression  PTA pt living with daughter and son in-law in single story home with 2 steps to enter. Pt reports independence with household ambulation with Rollator and utilizes w/c with community distances. Pt is currently limited in safe mobility by decreased strength and endurance. Pt requires supervision for bed mobility, and min A for transfers and ambulation with RW. PT recommends HHPT at discharge to return to safe mobility in her home environment. PT will continue to follow acutely.     Follow Up Recommendations Home health PT;Supervision/Assistance - 24 hour    Equipment Recommendations  None recommended by PT (has necessary equipment)    Recommendations for Other Services OT consult     Precautions / Restrictions Precautions Precautions: Fall Precaution Comments: hx of falling Restrictions Weight Bearing Restrictions: No      Mobility  Bed Mobility Overal bed mobility: Needs Assistance Bed Mobility: Supine to Sit     Supine to sit: Supervision;HOB elevated     General bed mobility comments: supervison for safety, increased use of bedrail to pull to EoB    Transfers Overall transfer level: Needs assistance Equipment used: Rolling walker (2 wheeled) Transfers: Sit to/from Stand Sit to Stand: Min assist         General transfer comment: contact guard for safety, requires 2x attempt and increased momentum to achieve  Ambulation/Gait Ambulation/Gait assistance: Min  assist Gait Distance (Feet): 3 Feet Assistive device: Rolling walker (2 wheeled) Gait Pattern/deviations: Step-through pattern;Decreased step length - right;Decreased step length - left;Shuffle;Trunk flexed Gait velocity: slowed Gait velocity interpretation: <1.31 ft/sec, indicative of household ambulator General Gait Details: min A for slow, shuffling steps to recliner,      Balance Overall balance assessment: Needs assistance Sitting-balance support: Feet supported;No upper extremity supported Sitting balance-Leahy Scale: Good     Standing balance support: Bilateral upper extremity supported Standing balance-Leahy Scale: Poor Standing balance comment: requires UE support for steadying                             Pertinent Vitals/Pain Pain Assessment: No/denies pain    Home Living Family/patient expects to be discharged to:: Private residence Living Arrangements: Children Available Help at Discharge: Family;Available 24 hours/day Type of Home: House Home Access: Stairs to enter Entrance Stairs-Rails: None Entrance Stairs-Number of Steps: 2 Home Layout: One level Home Equipment: Bedside commode;Tub bench;Walker - 4 wheels      Prior Function Level of Independence: Needs assistance   Gait / Transfers Assistance Needed: ambulates household distances with Rollator, mobilizes in community with w/c  ADL's / Homemaking Assistance Needed: daughter assists with tub transfers and bathing, and provides iADLs        Hand Dominance   Dominant Hand: Right    Extremity/Trunk Assessment   Upper Extremity Assessment Upper Extremity Assessment: Defer to OT evaluation    Lower Extremity Assessment Lower Extremity Assessment: RLE deficits/detail;LLE deficits/detail RLE Deficits / Details: ROM limited by body habitus, strength grossly 3+/5 RLE Sensation: WNL LLE Deficits /  Details: ROM limited by body habitus, strength grossly 3/5 residual from prior CVA LLE  Sensation: WNL       Communication   Communication: No difficulties  Cognition Arousal/Alertness: Awake/alert Behavior During Therapy: WFL for tasks assessed/performed Overall Cognitive Status: Within Functional Limits for tasks assessed                                 General Comments: A&O x4 although requires inreased time to recall, reports being anxious about not passing her test      General Comments General comments (skin integrity, edema, etc.): VSS on RA        Assessment/Plan    PT Assessment Patient needs continued PT services  PT Problem List Obesity;Decreased range of motion;Decreased strength;Decreased activity tolerance;Decreased balance;Decreased mobility       PT Treatment Interventions DME instruction;Gait training;Stair training;Functional mobility training;Therapeutic activities;Therapeutic exercise;Balance training;Cognitive remediation;Patient/family education    PT Goals (Current goals can be found in the Care Plan section)  Acute Rehab PT Goals Patient Stated Goal: pass "test" PT Goal Formulation: With patient Time For Goal Achievement: 01/23/21 Potential to Achieve Goals: Good    Frequency Min 3X/week    AM-PAC PT "6 Clicks" Mobility  Outcome Measure Help needed turning from your back to your side while in a flat bed without using bedrails?: None Help needed moving from lying on your back to sitting on the side of a flat bed without using bedrails?: A Little Help needed moving to and from a bed to a chair (including a wheelchair)?: A Little Help needed standing up from a chair using your arms (e.g., wheelchair or bedside chair)?: A Little Help needed to walk in hospital room?: A Little Help needed climbing 3-5 steps with a railing? : A Lot 6 Click Score: 18    End of Session Equipment Utilized During Treatment: Gait belt Activity Tolerance: Patient tolerated treatment well Patient left: in chair;with call bell/phone within  reach;with chair alarm set Nurse Communication: Mobility status PT Visit Diagnosis: Unsteadiness on feet (R26.81);Muscle weakness (generalized) (M62.81);Difficulty in walking, not elsewhere classified (R26.2)    Time: 1135-1204 PT Time Calculation (min) (ACUTE ONLY): 29 min   Charges:   PT Evaluation $PT Eval Moderate Complexity: 1 Mod PT Treatments $Therapeutic Activity: 8-22 mins        Japhet Morgenthaler B. Migdalia Dk PT, DPT Acute Rehabilitation Services Pager [phone] Office [phone]   [place] 01/09/2021, 12:33 PM

## 2021-01-09 NOTE — Progress Notes (Addendum)
Patient ID: Carol Page, female   DOB: 1935/11/17, 85 y.o.   MRN: [id number]    Progress Note   Subjective  # Day 3 CC; weakness, severe iron deficiency anemia  Patient did not complete her prep last evening, seizures canceled for today Plavix remains on hold  Hemoglobin 8.8/hematocrit 27.0-post 2 units on admission Creatinine 2.5 rising COVID-19 negative  2D echo shows EF 65 to 70% calcified aortic valve mild to moderate aortic valve regurg and mild aortic stenosis  Patient completed half of the prep and says she passed a whole lot of stool, but not clear. She is agreeable to further prep today. No specific complaints says ,she feels better in general since transfusions   Objective   Vital signs in last 24 hours: Temp:  [97.5 F (36.4 C)-99.1 F (37.3 C)] 98.1 F (36.7 C) (06/23 1027) Pulse Rate:  [64-81] 78 (06/23 1027) Resp:  [7-38] 18 (06/23 1027) BP: (76-158)/(42-128) 149/128 (06/23 1027) SpO2:  [86 %-98 %] 89 % (06/23 1027) Last BM Date: 01/09/21 General:   Elderly white female in NAD, sitting up in chair Heart:  Regular rate and rhythm; no murmurs Lungs: Respirations even and unlabored, lungs CTA bilaterally Abdomen:  Soft, nontender and nondistended. Normal bowel sounds. Extremities:  Without edema. Neurologic:  Alert and oriented,  grossly normal neurologically. Psych:  Cooperative. Normal mood and affect.  Intake/Output from previous day: 06/22 0701 - 06/23 0700 In: 749.5 [P.O.:360; I.V.:289.5; IV Piggyback:100] Out: 1 [Stool:1] Intake/Output this shift: No intake/output data recorded.  Lab Results: Recent Labs    01/07/21 1200 01/08/21 0645 01/09/21 0343  WBC 6.9 12.0* 10.5  HGB 7.0* 10.6* 8.8*  HCT 23.5* 33.1* 27.0*  PLT 254 245 188   BMET Recent Labs    01/07/21 1200 01/08/21 0444 01/08/21 2239 01/09/21 0343  NA 137 136  --  137  K 4.8 5.2* 4.3 4.1  CL 105 108  --  106  CO2 20* 18*  --  23  GLUCOSE 102* 126*  --  101*  BUN 37* 42*   --  39*  CREATININE 1.85* 2.30*  --  2.58*  CALCIUM 8.7* 8.5*  --  7.8*   LFT Recent Labs    01/07/21 1200 01/09/21 0343  PROT 6.1*  --   ALBUMIN 3.2* 2.6*  AST 17  --   ALT 11  --   ALKPHOS 67  --   BILITOT 0.4  --    PT/INR No results for input(s): LABPROT, INR in the last 72 hours.  Studies/Results: DG Chest 1 View  Result Date: 01/07/2021 CLINICAL DATA:  Anemia EXAM: CHEST  1 VIEW COMPARISON:  Portable exam 1635 hours compared to 08/19/2015 FINDINGS: Borderline enlargement of cardiac silhouette with mitral annular calcification. Mediastinal contours and pulmonary vascularity normal. Atherosclerotic calcification aorta. Atelectasis versus consolidation RIGHT middle lobe with loss of RIGHT heart border. Remaining lungs clear. No pleural effusion or pneumothorax. Bones demineralized. IMPRESSION: Atelectasis versus consolidation in RIGHT middle lobe. Electronically Signed   By: Lavonia Dana M.D.   On: 01/07/2021 16:50   ECHOCARDIOGRAM COMPLETE  Result Date: 01/08/2021    ECHOCARDIOGRAM REPORT   Patient Name:   Carol Page Date of Exam: 01/08/2021 Medical Rec #:  [id number]      Height:       62.0 in Accession #:    [id number]     Weight:       239.0 lb Date of Birth:  12/08/1935       BSA:  2.062 m Patient Age:    48 years       BP:           76/62 mmHg Patient Gender: F              HR:           67 bpm. Exam Location:  Inpatient Procedure: 2D Echo, Cardiac Doppler and Color Doppler Indications:    R01.1 Murmur  History:        Patient has no prior history of Echocardiogram examinations.                 Stroke; Risk Factors:Hypertension and Dyslipidemia.  Sonographer:    Jonelle Sidle Dance Referring Phys: [id number] [place]  1. Left ventricular ejection fraction, by estimation, is 65 to 70%. The left ventricle has normal function. The left ventricle has no regional wall motion abnormalities. Left ventricular diastolic parameters are consistent with Grade I diastolic  dysfunction (impaired relaxation). Elevated left ventricular end-diastolic pressure.  2. Right ventricular systolic function is normal. The right ventricular size is normal. There is normal pulmonary artery systolic pressure.  3. Left atrial size was mildly dilated.  4. The mitral valve is normal in structure. No evidence of mitral valve regurgitation. No evidence of mitral stenosis. Severe mitral annular calcification.  5. Partial fusion of the left and non-coronary cusps. The aortic valve is calcified. Aortic valve regurgitation is mild to moderate. Mild aortic valve stenosis.  6. The inferior vena cava is normal in size with greater than 50% respiratory variability, suggesting right atrial pressure of 3 mmHg. FINDINGS  Left Ventricle: Left ventricular ejection fraction, by estimation, is 65 to 70%. The left ventricle has normal function. The left ventricle has no regional wall motion abnormalities. The left ventricular internal cavity size was normal in size. There is  no left ventricular hypertrophy. Left ventricular diastolic parameters are consistent with Grade I diastolic dysfunction (impaired relaxation). Elevated left ventricular end-diastolic pressure. Right Ventricle: The right ventricular size is normal. No increase in right ventricular wall thickness. Right ventricular systolic function is normal. There is normal pulmonary artery systolic pressure. The tricuspid regurgitant velocity is 2.55 m/s, and  with an assumed right atrial pressure of 3 mmHg, the estimated right ventricular systolic pressure is 69.6 mmHg. Left Atrium: Left atrial size was mildly dilated. Right Atrium: Right atrial size was normal in size. Pericardium: There is no evidence of pericardial effusion. Mitral Valve: The mitral valve is normal in structure. Severe mitral annular calcification. No evidence of mitral valve regurgitation. No evidence of mitral valve stenosis. MV peak gradient, 8.6 mmHg. The mean mitral valve gradient is 4.0  mmHg. Tricuspid Valve: The tricuspid valve is normal in structure. Tricuspid valve regurgitation is trivial. No evidence of tricuspid stenosis. Aortic Valve: Partial fusion of the left and non-coronary cusps. The aortic valve is calcified. Aortic valve regurgitation is mild to moderate. Mild aortic stenosis is present. Aortic valve mean gradient measures 15.0 mmHg. Aortic valve peak gradient measures 25.8 mmHg. Aortic valve area, by VTI measures 1.81 cm. Pulmonic Valve: The pulmonic valve was normal in structure. Pulmonic valve regurgitation is not visualized. No evidence of pulmonic stenosis. Aorta: The aortic root is normal in size and structure. Venous: The inferior vena cava is normal in size with greater than 50% respiratory variability, suggesting right atrial pressure of 3 mmHg. IAS/Shunts: No atrial level shunt detected by color flow Doppler.  LEFT VENTRICLE PLAX 2D LVIDd:  4.90 cm  Diastology LVIDs:         3.00 cm  LV e' medial:    5.47 cm/s LV PW:         1.00 cm  LV E/e' medial:  23.9 LV IVS:        1.00 cm  LV e' lateral:   6.84 cm/s LVOT diam:     2.00 cm  LV E/e' lateral: 19.2 LV SV:         101 LV SV Index:   49 LVOT Area:     3.14 cm  RIGHT VENTRICLE             IVC RV Basal diam:  2.90 cm     IVC diam: 1.90 cm RV S prime:     16.30 cm/s TAPSE (M-mode): 2.5 cm LEFT ATRIUM             Index       RIGHT ATRIUM           Index LA diam:        3.60 cm 1.75 cm/m  RA Area:     15.80 cm LA Vol (A2C):   77.6 ml 37.63 ml/m RA Volume:   36.50 ml  17.70 ml/m LA Vol (A4C):   58.7 ml 28.47 ml/m LA Biplane Vol: 68.3 ml 33.12 ml/m  AORTIC VALVE AV Area (Vmax):    1.76 cm AV Area (Vmean):   1.82 cm AV Area (VTI):     1.81 cm AV Vmax:           254.00 cm/s AV Vmean:          162.800 cm/s AV VTI:            0.559 m AV Peak Grad:      25.8 mmHg AV Mean Grad:      15.0 mmHg LVOT Vmax:         142.50 cm/s LVOT Vmean:        94.350 cm/s LVOT VTI:          0.322 m LVOT/AV VTI ratio: 0.58  AORTA Ao Root  diam: 3.30 cm Ao Asc diam:  2.80 cm MITRAL VALVE                TRICUSPID VALVE MV Area (PHT): 1.79 cm     TR Peak grad:   26.0 mmHg MV Area VTI:   1.97 cm     TR Vmax:        255.00 cm/s MV Peak grad:  8.6 mmHg MV Mean grad:  4.0 mmHg     SHUNTS MV Vmax:       1.47 m/s     Systemic VTI:  0.32 m MV Vmean:      95.7 cm/s    Systemic Diam: 2.00 cm MV Decel Time: 423 msec MV E velocity: 131.00 cm/s MV A velocity: 143.00 cm/s MV E/A ratio:  0.92 Skeet Latch MD Electronically signed by Skeet Latch MD Signature Date/Time: 01/08/2021/4:56:24 PM    Final        Assessment / Plan:    #69 85 year old white female admitted with profound iron deficiency anemia, heme positive stool and progressive weakness and dyspnea  , She has been stable since transfusions though hemoglobin has drifted a bit and can  She was unable to complete the bowel prep last evening, procedures canceled for today and will be rescheduled for tomorrow. Will complete further bowel prep today  #2 chronic antiplatelet therapy-Plavix  on hold since admission #3 history of CVA #4 chronic debilitation #5 acute kidney injury-blood pressure meds on hold-management as per hospitalist           Active Problems:   Essential hypertension   HLD (hyperlipidemia)   Seizure disorder (HCC)   AKI (acute kidney injury) ([place])   GI bleed   Absolute anemia   CKD (chronic kidney disease) stage 3, GFR 30-59 ml/min (HCC)   Obesity, Class III, BMI 40-49.9 (morbid obesity) ([place])     LOS: 2 days   Amy Esterwood  PA-C6/23/2022, 12:10 PM      [facility] Attending   I have taken an interval history, reviewed the chart and examined the patient. I agree with the Advanced Practitioner's note, impression and recommendations.    Gatha Mayer, MD, [facility] 01/09/2021 3:08 PM

## 2021-01-09 NOTE — Progress Notes (Signed)
PROGRESS NOTE    Carol Page  TRV:[id number] DOB: 06/20/1936 DOA: 01/07/2021 PCP: Jearld Fenton, NP    Brief Narrative:  85 y/o female admitted to the hospital with fatigue and worsening anemia. She was transfused prbc with improvement in symptoms. GI following for EGD/colonoscopy   Assessment & Plan:   Active Problems:   Essential hypertension   HLD (hyperlipidemia)   Seizure disorder (HCC)   AKI (acute kidney injury) ([place])   GI bleed   Absolute anemia   CKD (chronic kidney disease) stage 3, GFR 30-59 ml/min (HCC)   Obesity, Class III, BMI 40-49.9 (morbid obesity) ([place])   Acute on chronic anemia -concern for underlying GI bleed -she was transfused 2 unit prbc on admission -baseline hemoglobin around 10 -she was admitted with hemoglobin of 7 -post transfusion hemoglobin improved to 10.6->8.8 -plan for EGD/colonoscopy today but she was unable to complete prep -plans are for another prep today -continue PPI BID  AKI on CKD 3b -baseline creatinine 1.3 -on admission, creatinine 1.8->2.3->2.5 ?related to ATN from anemia -holding lisinopril and HCTZ -continue gentle fluids overnight -check renal ultrasound -continue to follow  Seizure disorder -continue on dilantin  HTN -lisinopril/hctz currently on hold -diltiazem changed to short acting -BP currently stable, continue to monitor  HLD -continue statin  Obesity, Class III BMI 43.7 -increased risk of morbidity and mortality  History of CVA -on chronic aspirin and plavix -these have been held in light of concerns for bleeding -resume when felt safe to do so  Peripheral edema -suspect that she may have an element of chronic edema from venous stasis -echo shows normal EF -currently leg swelling appears to be near baseline -continue to monitor     DVT prophylaxis: SCDs Start: 01/07/21 1557  Code Status: DNR Family Communication: discussed with patient's daughter Disposition Plan: Status is:  Inpatient  Remains inpatient appropriate because:Ongoing diagnostic testing needed not appropriate for outpatient work up and Inpatient level of care appropriate due to severity of illness  Dispo: The patient is from: Home              Anticipated d/c is to: Home              Patient currently is not medically stable to d/c.   Difficult to place patient No         Consultants:  GI  Procedures:  Echo, preserved EF, grade 1 diastolic dysfunction  Antimicrobials:      Subjective: She was having bowel movements overnight that she describes as dark colored.   Objective: Vitals:   01/08/21 2048 01/08/21 2219 01/09/21 0327 01/09/21 1027  BP: 134/73 (!) 129/46 (!) 115/42 (!) 149/128  Pulse: 81 75 74 78  Resp: _0 Temp: 99.1 F (37.3 C) 98.6 F (37 C) (!) 97.5 F (36.4 C) 98.1 F (36.7 C)  TempSrc:   Oral Oral  SpO2: 95% 98% 92% (!) 89%  Weight:      Height:        Intake/Output Summary (Last 24 hours) at 01/09/2021 1330 Last data filed at 01/09/2021 0800 Gross per 24 hour  Intake 649.52 ml  Output 1 ml  Net 648.52 ml   Filed Weights   01/07/21 1420  Weight: 108.4 kg    Examination:  General exam: Appears calm and comfortable  Respiratory system: Clear to auscultation. Respiratory effort normal. Cardiovascular system: S1 & S2 heard, RRR. No JVD, murmurs, rubs, gallops or clicks. No pedal edema. Gastrointestinal system: Abdomen is  nondistended, soft and nontender. No organomegaly or masses felt. Normal bowel sounds heard. Central nervous system: Alert and oriented. No focal neurological deficits. Extremities: 1+ edema bilaterally Skin: No rashes, lesions or ulcers Psychiatry: Judgement and insight appear normal. Mood & affect appropriate.     Data Reviewed: I have personally reviewed following labs and imaging studies  CBC: Recent Labs  Lab 01/06/21 0943 01/07/21 1200 01/08/21 0645 01/09/21 0343  WBC 6.8 6.9 12.0* 10.5  NEUTROABS 4,862  --    --   --   HGB 7.2* 7.0* 10.6* 8.8*  HCT 23.8* 23.5* 33.1* 27.0*  MCV 93.7 94.8 89.5 88.2  PLT 242 254 245 976   Basic Metabolic Panel: Recent Labs  Lab 01/07/21 1200 01/08/21 0444 01/08/21 2239 01/09/21 0343  NA 137 136  --  137  K 4.8 5.2* 4.3 4.1  CL 105 108  --  106  CO2 20* 18*  --  23  GLUCOSE 102* 126*  --  101*  BUN 37* 42*  --  39*  CREATININE 1.85* 2.30*  --  2.58*  CALCIUM 8.7* 8.5*  --  7.8*  PHOS  --   --   --  4.0   GFR: Estimated Creatinine Clearance: 19.1 mL/min (A) (by C-G formula based on SCr of 2.58 mg/dL (H)). Liver Function Tests: Recent Labs  Lab 01/07/21 1200 01/09/21 0343  AST 17  --   ALT 11  --   ALKPHOS 67  --   BILITOT 0.4  --   PROT 6.1*  --   ALBUMIN 3.2* 2.6*   No results for input(s): LIPASE, AMYLASE in the last 168 hours. No results for input(s): AMMONIA in the last 168 hours. Coagulation Profile: No results for input(s): INR, PROTIME in the last 168 hours. Cardiac Enzymes: No results for input(s): CKTOTAL, CKMB, CKMBINDEX, TROPONINI in the last 168 hours. BNP (last 3 results) No results for input(s): PROBNP in the last 8760 hours. HbA1C: No results for input(s): HGBA1C in the last 72 hours. CBG: No results for input(s): GLUCAP in the last 168 hours. Lipid Profile: No results for input(s): CHOL, HDL, LDLCALC, TRIG, CHOLHDL, LDLDIRECT in the last 72 hours. Thyroid Function Tests: No results for input(s): TSH, T4TOTAL, FREET4, T3FREE, THYROIDAB in the last 72 hours. Anemia Panel: No results for input(s): VITAMINB12, FOLATE, FERRITIN, TIBC, IRON, RETICCTPCT in the last 72 hours.  Sepsis Labs: No results for input(s): PROCALCITON, LATICACIDVEN in the last 168 hours.  Recent Results (from the past 240 hour(s))  SARS CORONAVIRUS 2 (TAT 6-24 HRS) Nasopharyngeal Nasopharyngeal Swab     Status: None   Collection Time: 01/09/21  3:54 AM   Specimen: Nasopharyngeal Swab  Result Value Ref Range Status   SARS Coronavirus 2 NEGATIVE  NEGATIVE Final    Comment: (NOTE) SARS-CoV-2 target nucleic acids are NOT DETECTED.  The SARS-CoV-2 RNA is generally detectable in upper and lower respiratory specimens during the acute phase of infection. Negative results do not preclude SARS-CoV-2 infection, do not rule out co-infections with other pathogens, and should not be used as the sole basis for treatment or other patient management decisions. Negative results must be combined with clinical observations, patient history, and epidemiological information. The expected result is Negative.  Fact Sheet for Patients: SugarRoll.be  Fact Sheet for Healthcare Providers: https://www.woods-mathews.com/  This test is not yet approved or cleared by the [place] FDA and  has been authorized for detection and/or diagnosis of SARS-CoV-2 by FDA under an Emergency Use Authorization (EUA).  This EUA will remain  in effect (meaning this test can be used) for the duration of the COVID-19 declaration under Se ction 564(b)(1) of the Act, 21 U.S.C. section 360bbb-3(b)(1), unless the authorization is terminated or revoked sooner.  Performed at [facility], [place] [address] [place], [place] [address]   Resp Panel by RT-PCR (Flu A&B, Covid) Nasopharyngeal Swab     Status: None   Collection Time: 01/09/21  7:08 AM   Specimen: Nasopharyngeal Swab; Nasopharyngeal(NP) swabs in vial transport medium  Result Value Ref Range Status   SARS Coronavirus 2 by RT PCR NEGATIVE NEGATIVE Final    Comment: (NOTE) SARS-CoV-2 target nucleic acids are NOT DETECTED.  The SARS-CoV-2 RNA is generally detectable in upper respiratory specimens during the acute phase of infection. The lowest concentration of SARS-CoV-2 viral copies this assay can detect is 138 copies/mL. A negative result does not preclude SARS-Cov-2 infection and should not be used as the sole basis for treatment or other patient management  decisions. A negative result may occur with  improper specimen collection/handling, submission of specimen other than nasopharyngeal swab, presence of viral mutation(s) within the areas targeted by this assay, and inadequate number of viral copies(<138 copies/mL). A negative result must be combined with clinical observations, patient history, and epidemiological information. The expected result is Negative.  Fact Sheet for Patients:  EntrepreneurPulse.com.au  Fact Sheet for Healthcare Providers:  IncredibleEmployment.be  This test is no t yet approved or cleared by the [place] FDA and  has been authorized for detection and/or diagnosis of SARS-CoV-2 by FDA under an Emergency Use Authorization (EUA). This EUA will remain  in effect (meaning this test can be used) for the duration of the COVID-19 declaration under Section 564(b)(1) of the Act, 21 U.S.C.section 360bbb-3(b)(1), unless the authorization is terminated  or revoked sooner.       Influenza A by PCR NEGATIVE NEGATIVE Final   Influenza B by PCR NEGATIVE NEGATIVE Final    Comment: (NOTE) The Xpert Xpress SARS-CoV-2/FLU/RSV plus assay is intended as an aid in the diagnosis of influenza from Nasopharyngeal swab specimens and should not be used as a sole basis for treatment. Nasal washings and aspirates are unacceptable for Xpert Xpress SARS-CoV-2/FLU/RSV testing.  Fact Sheet for Patients: EntrepreneurPulse.com.au  Fact Sheet for Healthcare Providers: IncredibleEmployment.be  This test is not yet approved or cleared by the [place] FDA and has been authorized for detection and/or diagnosis of SARS-CoV-2 by FDA under an Emergency Use Authorization (EUA). This EUA will remain in effect (meaning this test can be used) for the duration of the COVID-19 declaration under Section 564(b)(1) of the Act, 21 U.S.C. section 360bbb-3(b)(1), unless the  authorization is terminated or revoked.  Performed at [facility], [place] [address], [place], [place] [address]          Radiology Studies: DG Chest 1 View  Result Date: 01/07/2021 CLINICAL DATA:  Anemia EXAM: CHEST  1 VIEW COMPARISON:  Portable exam 1635 hours compared to 08/19/2015 FINDINGS: Borderline enlargement of cardiac silhouette with mitral annular calcification. Mediastinal contours and pulmonary vascularity normal. Atherosclerotic calcification aorta. Atelectasis versus consolidation RIGHT middle lobe with loss of RIGHT heart border. Remaining lungs clear. No pleural effusion or pneumothorax. Bones demineralized. IMPRESSION: Atelectasis versus consolidation in RIGHT middle lobe. Electronically Signed   By: Lavonia Dana M.D.   On: 01/07/2021 16:50   ECHOCARDIOGRAM COMPLETE  Result Date: 01/08/2021    ECHOCARDIOGRAM REPORT   Patient Name:   [facility]  [place] Date of Exam: 01/08/2021 Medical Rec #:  [id number]      Height:       62.0 in Accession #:    [id number]     Weight:       239.0 lb Date of Birth:  05/08/1936       BSA:          2.062 m Patient Age:    57 years       BP:           76/62 mmHg Patient Gender: F              HR:           67 bpm. Exam Location:  Inpatient Procedure: 2D Echo, Cardiac Doppler and Color Doppler Indications:    R01.1 Murmur  History:        Patient has no prior history of Echocardiogram examinations.                 Stroke; Risk Factors:Hypertension and Dyslipidemia.  Sonographer:    Jonelle Sidle Dance Referring Phys: [id number] [place]  1. Left ventricular ejection fraction, by estimation, is 65 to 70%. The left ventricle has normal function. The left ventricle has no regional wall motion abnormalities. Left ventricular diastolic parameters are consistent with Grade I diastolic dysfunction (impaired relaxation). Elevated left ventricular end-diastolic pressure.  2. Right ventricular systolic function is normal. The right ventricular size is normal.  There is normal pulmonary artery systolic pressure.  3. Left atrial size was mildly dilated.  4. The mitral valve is normal in structure. No evidence of mitral valve regurgitation. No evidence of mitral stenosis. Severe mitral annular calcification.  5. Partial fusion of the left and non-coronary cusps. The aortic valve is calcified. Aortic valve regurgitation is mild to moderate. Mild aortic valve stenosis.  6. The inferior vena cava is normal in size with greater than 50% respiratory variability, suggesting right atrial pressure of 3 mmHg. FINDINGS  Left Ventricle: Left ventricular ejection fraction, by estimation, is 65 to 70%. The left ventricle has normal function. The left ventricle has no regional wall motion abnormalities. The left ventricular internal cavity size was normal in size. There is  no left ventricular hypertrophy. Left ventricular diastolic parameters are consistent with Grade I diastolic dysfunction (impaired relaxation). Elevated left ventricular end-diastolic pressure. Right Ventricle: The right ventricular size is normal. No increase in right ventricular wall thickness. Right ventricular systolic function is normal. There is normal pulmonary artery systolic pressure. The tricuspid regurgitant velocity is 2.55 m/s, and  with an assumed right atrial pressure of 3 mmHg, the estimated right ventricular systolic pressure is 48.5 mmHg. Left Atrium: Left atrial size was mildly dilated. Right Atrium: Right atrial size was normal in size. Pericardium: There is no evidence of pericardial effusion. Mitral Valve: The mitral valve is normal in structure. Severe mitral annular calcification. No evidence of mitral valve regurgitation. No evidence of mitral valve stenosis. MV peak gradient, 8.6 mmHg. The mean mitral valve gradient is 4.0 mmHg. Tricuspid Valve: The tricuspid valve is normal in structure. Tricuspid valve regurgitation is trivial. No evidence of tricuspid stenosis. Aortic Valve: Partial fusion  of the left and non-coronary cusps. The aortic valve is calcified. Aortic valve regurgitation is mild to moderate. Mild aortic stenosis is present. Aortic valve mean gradient measures 15.0 mmHg. Aortic valve peak gradient measures 25.8 mmHg. Aortic valve area, by VTI measures 1.81 cm. Pulmonic Valve: The pulmonic valve was normal in structure. Pulmonic  valve regurgitation is not visualized. No evidence of pulmonic stenosis. Aorta: The aortic root is normal in size and structure. Venous: The inferior vena cava is normal in size with greater than 50% respiratory variability, suggesting right atrial pressure of 3 mmHg. IAS/Shunts: No atrial level shunt detected by color flow Doppler.  LEFT VENTRICLE PLAX 2D LVIDd:         4.90 cm  Diastology LVIDs:         3.00 cm  LV e' medial:    5.47 cm/s LV PW:         1.00 cm  LV E/e' medial:  23.9 LV IVS:        1.00 cm  LV e' lateral:   6.84 cm/s LVOT diam:     2.00 cm  LV E/e' lateral: 19.2 LV SV:         101 LV SV Index:   49 LVOT Area:     3.14 cm  RIGHT VENTRICLE             IVC RV Basal diam:  2.90 cm     IVC diam: 1.90 cm RV S prime:     16.30 cm/s TAPSE (M-mode): 2.5 cm LEFT ATRIUM             Index       RIGHT ATRIUM           Index LA diam:        3.60 cm 1.75 cm/m  RA Area:     15.80 cm LA Vol (A2C):   77.6 ml 37.63 ml/m RA Volume:   36.50 ml  17.70 ml/m LA Vol (A4C):   58.7 ml 28.47 ml/m LA Biplane Vol: 68.3 ml 33.12 ml/m  AORTIC VALVE AV Area (Vmax):    1.76 cm AV Area (Vmean):   1.82 cm AV Area (VTI):     1.81 cm AV Vmax:           254.00 cm/s AV Vmean:          162.800 cm/s AV VTI:            0.559 m AV Peak Grad:      25.8 mmHg AV Mean Grad:      15.0 mmHg LVOT Vmax:         142.50 cm/s LVOT Vmean:        94.350 cm/s LVOT VTI:          0.322 m LVOT/AV VTI ratio: 0.58  AORTA Ao Root diam: 3.30 cm Ao Asc diam:  2.80 cm MITRAL VALVE                TRICUSPID VALVE MV Area (PHT): 1.79 cm     TR Peak grad:   26.0 mmHg MV Area VTI:   1.97 cm     TR Vmax:         255.00 cm/s MV Peak grad:  8.6 mmHg MV Mean grad:  4.0 mmHg     SHUNTS MV Vmax:       1.47 m/s     Systemic VTI:  0.32 m MV Vmean:      95.7 cm/s    Systemic Diam: 2.00 cm MV Decel Time: 423 msec MV E velocity: 131.00 cm/s MV A velocity: 143.00 cm/s MV E/A ratio:  0.92 Skeet Latch MD Electronically signed by Skeet Latch MD Signature Date/Time: 01/08/2021/4:56:24 PM    Final         Scheduled Meds:  atorvastatin  80 mg Oral Daily   cholecalciferol  1,000 Units Oral Daily   diltiazem  30 mg Oral Q6H   escitalopram  10 mg Oral Daily   gabapentin  300 mg Oral BID   loratadine  10 mg Oral Daily   pantoprazole (PROTONIX) IV  40 mg Intravenous Q12H   peg 3350 powder  1 kit Oral Once   senna-docusate  1 tablet Oral BID   sodium zirconium cyclosilicate  10 g Oral Once   traZODone  150 mg Oral QHS   vitamin B-12  1,000 mcg Oral Daily   Continuous Infusions:  iron sucrose Stopped (01/08/21 1112)   lactated ringers 50 mL/hr at 01/08/21 2215     LOS: 2 days    Time spent: 68mns    JKathie Dike MD [facility]   If 7PM-7AM, please contact night-coverage [email or web address]  01/09/2021, 1:30 PM

## 2021-01-09 NOTE — Anesthesia Preprocedure Evaluation (Addendum)
Anesthesia Evaluation  Patient identified by MRN, date of birth, ID band Patient awake    Reviewed: Allergy & Precautions, NPO status , Patient's Chart, lab work & pertinent test results  Airway Mallampati: II  TM Distance: >3 FB Neck ROM: Full    Dental  (+) Teeth Intact   Pulmonary neg pulmonary ROS,    breath sounds clear to auscultation       Cardiovascular hypertension, Pt. on medications +CHF (grade 1 diastolic dysfunction)  + Valvular Problems/Murmurs (mild to mod AI, mild AS) AI and AS  Rhythm:Regular Rate:Normal  Echo 01/08/21: 1. Left ventricular ejection fraction, by estimation, is 65 to 70%. The  left ventricle has normal function. The left ventricle has no regional  wall motion abnormalities. Left ventricular diastolic parameters are  consistent with Grade I diastolic  dysfunction (impaired relaxation). Elevated left ventricular end-diastolic  pressure.  2. Right ventricular systolic function is normal. The right ventricular  size is normal. There is normal pulmonary artery systolic pressure.  3. Left atrial size was mildly dilated.  4. The mitral valve is normal in structure. No evidence of mitral valve  regurgitation. No evidence of mitral stenosis. Severe mitral annular  calcification.  5. Partial fusion of the left and non-coronary cusps. The aortic valve is  calcified. Aortic valve regurgitation is mild to moderate. Mild aortic  valve stenosis.  6. The inferior vena cava is normal in size with greater than 50%  respiratory variability, suggesting right atrial pressure of 3 mmHg.    Neuro/Psych Seizures -,  PSYCHIATRIC DISORDERS Anxiety Depression CVA (chronic ASA, plavix)    GI/Hepatic negative GI ROS, Neg liver ROS,   Endo/Other  diabetes (pre-diabetic)Morbid obesityBMI 44  Renal/GU Renal Insufficiency, CRF and ARFRenal diseaseCr 2.58, AKI on CKD 3 (baseline 1.3)- HCTZ and lisinopril being held  currently   negative genitourinary   Musculoskeletal  (+) Arthritis , Osteoarthritis,    Abdominal   Peds  Hematology  (+) Blood dyscrasia, anemia , Iron deficiency anemia 8.8/27 (admitted w/ Hb 7- now s/p 2 units prbc)   Anesthesia Other Findings   Reproductive/Obstetrics negative OB ROS                            Anesthesia Physical Anesthesia Plan  ASA: 3  Anesthesia Plan: MAC   Post-op Pain Management:    Induction:   PONV Risk Score and Plan: 2 and Propofol infusion and TIVA  Airway Management Planned: Natural Airway and Simple Face Mask  Additional Equipment: None  Intra-op Plan:   Post-operative Plan:   Informed Consent: I have reviewed the patients History and Physical, chart, labs and discussed the procedure including the risks, benefits and alternatives for the proposed anesthesia with the patient or authorized representative who has indicated his/her understanding and acceptance.       Plan Discussed with: Anesthesiologist and CRNA  Anesthesia Plan Comments:        Anesthesia Quick Evaluation

## 2021-01-09 NOTE — TOC Initial Note (Signed)
Transition of Care [facility]) - Initial/Assessment Note    Patient Details  Name: Carol Page MRN: [id number] Date of Birth: 01-May-1936  Transition [facility]) CM/SW Contact:    Verdell Carmine, RN Phone Number: 01/09/2021, 9:00 AM  Clinical Narrative:                 85 year old admitted  for GI bleed, anemia. Lives at home with daughter. Had echo, for EGD today to identify if there is a source of bleeding. No needs identified. CM will follow  Expected Discharge Plan: Home/Self Care Barriers to Discharge: Continued Medical Work up   Patient Goals and CMS Choice        Expected Discharge Plan and Services Expected Discharge Plan: Home/Self Care   Discharge Planning Services: CM Consult   Living arrangements for the past 2 months: Single Family Home                                      Prior Living Arrangements/Services Living arrangements for the past 2 months: Single Family Home Lives with:: Adult Children Patient language and need for interpreter reviewed:: Yes        Need for Family Participation in Patient Care: Yes (Comment) Care giver support system in place?: Yes (comment)   Criminal Activity/Legal Involvement Pertinent to Current Situation/Hospitalization: No - Comment as needed  Activities of Daily Living      Permission Sought/Granted                  Emotional Assessment       Orientation: : Oriented to Self, Oriented to Place, Oriented to  Time Alcohol / Substance Use: Not Applicable Psych Involvement: No (comment)  Admission diagnosis:  Anemia [D64.9] GI bleed [K92.2] Symptomatic anemia [D64.9] Patient Active Problem List   Diagnosis Date Noted   CKD (chronic kidney disease) stage 3, GFR 30-59 ml/min (HCC) 01/08/2021   Obesity, Class III, BMI 40-49.9 (morbid obesity) ([place]) 01/08/2021   GI bleed 01/07/2021   Absolute anemia    AKI (acute kidney injury) ([place]) 08/13/2015   Anxiety and depression 10/06/2013   Urinary  incontinence 10/06/2013   Chronic leg pain 10/06/2013   History of CVA (cerebrovascular accident) 10/06/2013   Essential hypertension 11/09/2012   HLD (hyperlipidemia) 11/09/2012   Pre-diabetes 11/09/2012   Seizure disorder ([place]) 11/09/2012   PCP:  Jearld Fenton, NP Pharmacy:   [facility] ([place]), [place] [address] [place]) [place] [address] Phone: [phone] Fax: [phone]     Social Determinants of Health (SDOH) Interventions    Readmission Risk Interventions No flowsheet data found.

## 2021-01-09 NOTE — H&P (View-Only) (Signed)
Patient ID: Carol Page, female   DOB: Nov 24, 1935, 85 y.o.   MRN: [id number]    Progress Note   Subjective  # Day 3 CC; weakness, severe iron deficiency anemia  Patient did not complete her prep last evening, seizures canceled for today Plavix remains on hold  Hemoglobin 8.8/hematocrit 27.0-post 2 units on admission Creatinine 2.5 rising COVID-19 negative  2D echo shows EF 65 to 70% calcified aortic valve mild to moderate aortic valve regurg and mild aortic stenosis  Patient completed half of the prep and says she passed a whole lot of stool, but not clear. She is agreeable to further prep today. No specific complaints says ,she feels better in general since transfusions   Objective   Vital signs in last 24 hours: Temp:  [97.5 F (36.4 C)-99.1 F (37.3 C)] 98.1 F (36.7 C) (06/23 1027) Pulse Rate:  [64-81] 78 (06/23 1027) Resp:  [7-38] 18 (06/23 1027) BP: (76-158)/(42-128) 149/128 (06/23 1027) SpO2:  [86 %-98 %] 89 % (06/23 1027) Last BM Date: 01/09/21 General:   Elderly white female in NAD, sitting up in chair Heart:  Regular rate and rhythm; no murmurs Lungs: Respirations even and unlabored, lungs CTA bilaterally Abdomen:  Soft, nontender and nondistended. Normal bowel sounds. Extremities:  Without edema. Neurologic:  Alert and oriented,  grossly normal neurologically. Psych:  Cooperative. Normal mood and affect.  Intake/Output from previous day: 06/22 0701 - 06/23 0700 In: 749.5 [P.O.:360; I.V.:289.5; IV Piggyback:100] Out: 1 [Stool:1] Intake/Output this shift: No intake/output data recorded.  Lab Results: Recent Labs    01/07/21 1200 01/08/21 0645 01/09/21 0343  WBC 6.9 12.0* 10.5  HGB 7.0* 10.6* 8.8*  HCT 23.5* 33.1* 27.0*  PLT 254 245 188   BMET Recent Labs    01/07/21 1200 01/08/21 0444 01/08/21 2239 01/09/21 0343  NA 137 136  --  137  K 4.8 5.2* 4.3 4.1  CL 105 108  --  106  CO2 20* 18*  --  23  GLUCOSE 102* 126*  --  101*  BUN 37* 42*   --  39*  CREATININE 1.85* 2.30*  --  2.58*  CALCIUM 8.7* 8.5*  --  7.8*   LFT Recent Labs    01/07/21 1200 01/09/21 0343  PROT 6.1*  --   ALBUMIN 3.2* 2.6*  AST 17  --   ALT 11  --   ALKPHOS 67  --   BILITOT 0.4  --    PT/INR No results for input(s): LABPROT, INR in the last 72 hours.  Studies/Results: DG Chest 1 View  Result Date: 01/07/2021 CLINICAL DATA:  Anemia EXAM: CHEST  1 VIEW COMPARISON:  Portable exam 1635 hours compared to 08/19/2015 FINDINGS: Borderline enlargement of cardiac silhouette with mitral annular calcification. Mediastinal contours and pulmonary vascularity normal. Atherosclerotic calcification aorta. Atelectasis versus consolidation RIGHT middle lobe with loss of RIGHT heart border. Remaining lungs clear. No pleural effusion or pneumothorax. Bones demineralized. IMPRESSION: Atelectasis versus consolidation in RIGHT middle lobe. Electronically Signed   By: Lavonia Dana M.D.   On: 01/07/2021 16:50   ECHOCARDIOGRAM COMPLETE  Result Date: 01/08/2021    ECHOCARDIOGRAM REPORT   Patient Name:   Carol Page Date of Exam: 01/08/2021 Medical Rec #:  [id number]      Height:       62.0 in Accession #:    [id number]     Weight:       239.0 lb Date of Birth:  July 15, 1936       BSA:  2.062 m Patient Age:    40 years       BP:           76/62 mmHg Patient Gender: F              HR:           67 bpm. Exam Location:  Inpatient Procedure: 2D Echo, Cardiac Doppler and Color Doppler Indications:    R01.1 Murmur  History:        Patient has no prior history of Echocardiogram examinations.                 Stroke; Risk Factors:Hypertension and Dyslipidemia.  Sonographer:    Jonelle Sidle Dance Referring Phys: [id number] [place]  1. Left ventricular ejection fraction, by estimation, is 65 to 70%. The left ventricle has normal function. The left ventricle has no regional wall motion abnormalities. Left ventricular diastolic parameters are consistent with Grade I diastolic  dysfunction (impaired relaxation). Elevated left ventricular end-diastolic pressure.  2. Right ventricular systolic function is normal. The right ventricular size is normal. There is normal pulmonary artery systolic pressure.  3. Left atrial size was mildly dilated.  4. The mitral valve is normal in structure. No evidence of mitral valve regurgitation. No evidence of mitral stenosis. Severe mitral annular calcification.  5. Partial fusion of the left and non-coronary cusps. The aortic valve is calcified. Aortic valve regurgitation is mild to moderate. Mild aortic valve stenosis.  6. The inferior vena cava is normal in size with greater than 50% respiratory variability, suggesting right atrial pressure of 3 mmHg. FINDINGS  Left Ventricle: Left ventricular ejection fraction, by estimation, is 65 to 70%. The left ventricle has normal function. The left ventricle has no regional wall motion abnormalities. The left ventricular internal cavity size was normal in size. There is  no left ventricular hypertrophy. Left ventricular diastolic parameters are consistent with Grade I diastolic dysfunction (impaired relaxation). Elevated left ventricular end-diastolic pressure. Right Ventricle: The right ventricular size is normal. No increase in right ventricular wall thickness. Right ventricular systolic function is normal. There is normal pulmonary artery systolic pressure. The tricuspid regurgitant velocity is 2.55 m/s, and  with an assumed right atrial pressure of 3 mmHg, the estimated right ventricular systolic pressure is 15.4 mmHg. Left Atrium: Left atrial size was mildly dilated. Right Atrium: Right atrial size was normal in size. Pericardium: There is no evidence of pericardial effusion. Mitral Valve: The mitral valve is normal in structure. Severe mitral annular calcification. No evidence of mitral valve regurgitation. No evidence of mitral valve stenosis. MV peak gradient, 8.6 mmHg. The mean mitral valve gradient is 4.0  mmHg. Tricuspid Valve: The tricuspid valve is normal in structure. Tricuspid valve regurgitation is trivial. No evidence of tricuspid stenosis. Aortic Valve: Partial fusion of the left and non-coronary cusps. The aortic valve is calcified. Aortic valve regurgitation is mild to moderate. Mild aortic stenosis is present. Aortic valve mean gradient measures 15.0 mmHg. Aortic valve peak gradient measures 25.8 mmHg. Aortic valve area, by VTI measures 1.81 cm. Pulmonic Valve: The pulmonic valve was normal in structure. Pulmonic valve regurgitation is not visualized. No evidence of pulmonic stenosis. Aorta: The aortic root is normal in size and structure. Venous: The inferior vena cava is normal in size with greater than 50% respiratory variability, suggesting right atrial pressure of 3 mmHg. IAS/Shunts: No atrial level shunt detected by color flow Doppler.  LEFT VENTRICLE PLAX 2D LVIDd:  4.90 cm  Diastology LVIDs:         3.00 cm  LV e' medial:    5.47 cm/s LV PW:         1.00 cm  LV E/e' medial:  23.9 LV IVS:        1.00 cm  LV e' lateral:   6.84 cm/s LVOT diam:     2.00 cm  LV E/e' lateral: 19.2 LV SV:         101 LV SV Index:   49 LVOT Area:     3.14 cm  RIGHT VENTRICLE             IVC RV Basal diam:  2.90 cm     IVC diam: 1.90 cm RV S prime:     16.30 cm/s TAPSE (M-mode): 2.5 cm LEFT ATRIUM             Index       RIGHT ATRIUM           Index LA diam:        3.60 cm 1.75 cm/m  RA Area:     15.80 cm LA Vol (A2C):   77.6 ml 37.63 ml/m RA Volume:   36.50 ml  17.70 ml/m LA Vol (A4C):   58.7 ml 28.47 ml/m LA Biplane Vol: 68.3 ml 33.12 ml/m  AORTIC VALVE AV Area (Vmax):    1.76 cm AV Area (Vmean):   1.82 cm AV Area (VTI):     1.81 cm AV Vmax:           254.00 cm/s AV Vmean:          162.800 cm/s AV VTI:            0.559 m AV Peak Grad:      25.8 mmHg AV Mean Grad:      15.0 mmHg LVOT Vmax:         142.50 cm/s LVOT Vmean:        94.350 cm/s LVOT VTI:          0.322 m LVOT/AV VTI ratio: 0.58  AORTA Ao Root  diam: 3.30 cm Ao Asc diam:  2.80 cm MITRAL VALVE                TRICUSPID VALVE MV Area (PHT): 1.79 cm     TR Peak grad:   26.0 mmHg MV Area VTI:   1.97 cm     TR Vmax:        255.00 cm/s MV Peak grad:  8.6 mmHg MV Mean grad:  4.0 mmHg     SHUNTS MV Vmax:       1.47 m/s     Systemic VTI:  0.32 m MV Vmean:      95.7 cm/s    Systemic Diam: 2.00 cm MV Decel Time: 423 msec MV E velocity: 131.00 cm/s MV A velocity: 143.00 cm/s MV E/A ratio:  0.92 Skeet Latch MD Electronically signed by Skeet Latch MD Signature Date/Time: 01/08/2021/4:56:24 PM    Final        Assessment / Plan:    #75 85 year old white female admitted with profound iron deficiency anemia, heme positive stool and progressive weakness and dyspnea  , She has been stable since transfusions though hemoglobin has drifted a bit and can  She was unable to complete the bowel prep last evening, procedures canceled for today and will be rescheduled for tomorrow. Will complete further bowel prep today  #2 chronic antiplatelet therapy-Plavix  on hold since admission #3 history of CVA #4 chronic debilitation #5 acute kidney injury-blood pressure meds on hold-management as per hospitalist           Active Problems:   Essential hypertension   HLD (hyperlipidemia)   Seizure disorder (HCC)   AKI (acute kidney injury) ([place])   GI bleed   Absolute anemia   CKD (chronic kidney disease) stage 3, GFR 30-59 ml/min (HCC)   Obesity, Class III, BMI 40-49.9 (morbid obesity) ([place])     LOS: 2 days   Amy Esterwood  PA-C6/23/2022, 12:10 PM      [facility] Attending   I have taken an interval history, reviewed the chart and examined the patient. I agree with the Advanced Practitioner's note, impression and recommendations.    Gatha Mayer, MD, [facility] 01/09/2021 3:08 PM

## 2021-01-10 ENCOUNTER — Inpatient Hospital Stay (HOSPITAL_COMMUNITY): Payer: Medicare Other | Admitting: Anesthesiology

## 2021-01-10 ENCOUNTER — Encounter (HOSPITAL_COMMUNITY)
Admission: EM | Disposition: A | Discharge status: Home Health | Payer: Self-pay | Source: Home / Self Care | Attending: Internal Medicine

## 2021-01-10 ENCOUNTER — Encounter (HOSPITAL_COMMUNITY): Payer: Self-pay | Admitting: Internal Medicine

## 2021-01-10 DIAGNOSIS — K31819 Angiodysplasia of stomach and duodenum without bleeding: Secondary | ICD-10-CM | POA: Diagnosis present

## 2021-01-10 HISTORY — PX: BIOPSY: SHX5522

## 2021-01-10 HISTORY — PX: COLONOSCOPY WITH PROPOFOL: SHX5780

## 2021-01-10 HISTORY — PX: ESOPHAGOGASTRODUODENOSCOPY (EGD) WITH PROPOFOL: SHX5813

## 2021-01-10 LAB — CBC
HCT: 29.5 % — ABNORMAL LOW (ref 36.0–46.0)
Hemoglobin: 9.4 g/dL — ABNORMAL LOW (ref 12.0–15.0)
MCH: 28.3 pg (ref 26.0–34.0)
MCHC: 31.9 g/dL (ref 30.0–36.0)
MCV: 88.9 fL (ref 80.0–100.0)
Platelets: 208 10*3/uL (ref 150–400)
RBC: 3.32 MIL/uL — ABNORMAL LOW (ref 3.87–5.11)
RDW: 14.7 % (ref 11.5–15.5)
WBC: 9.2 10*3/uL (ref 4.0–10.5)
nRBC: 0 % (ref 0.0–0.2)

## 2021-01-10 LAB — RENAL FUNCTION PANEL
Albumin: 2.9 g/dL — ABNORMAL LOW (ref 3.5–5.0)
Anion gap: 10 (ref 5–15)
BUN: 34 mg/dL — ABNORMAL HIGH (ref 8–23)
CO2: 27 mmol/L (ref 22–32)
Calcium: 8.5 mg/dL — ABNORMAL LOW (ref 8.9–10.3)
Chloride: 102 mmol/L (ref 98–111)
Creatinine, Ser: 2.06 mg/dL — ABNORMAL HIGH (ref 0.44–1.00)
GFR, Estimated: 23 mL/min — ABNORMAL LOW (ref >60)
Glucose, Bld: 81 mg/dL (ref 70–99)
Phosphorus: 3.3 mg/dL (ref 2.5–4.6)
Potassium: 4.3 mmol/L (ref 3.5–5.1)
Sodium: 139 mmol/L (ref 135–145)

## 2021-01-10 SURGERY — ESOPHAGOGASTRODUODENOSCOPY (EGD) WITH PROPOFOL
Anesthesia: Monitor Anesthesia Care

## 2021-01-10 MED ORDER — PHENYLEPHRINE HCL-NACL 10-0.9 MG/250ML-% IV SOLN
INTRAVENOUS | Status: DC | PRN
  Administered 2021-01-10: 25 ug/min via INTRAVENOUS

## 2021-01-10 MED ORDER — LIDOCAINE 2% (20 MG/ML) 5 ML SYRINGE
INTRAMUSCULAR | Status: DC | PRN
  Administered 2021-01-10: 100 mg via INTRAVENOUS

## 2021-01-10 MED ORDER — SODIUM CHLORIDE 0.9 % IV SOLN: INTRAVENOUS | Status: DC | PRN

## 2021-01-10 MED ORDER — PROPOFOL 500 MG/50ML IV EMUL
INTRAVENOUS | Status: DC | PRN
  Administered 2021-01-10: 100 ug/kg/min via INTRAVENOUS

## 2021-01-10 MED ORDER — GLYCOPYRROLATE PF 0.2 MG/ML IJ SOSY
PREFILLED_SYRINGE | INTRAMUSCULAR | Status: DC | PRN
  Administered 2021-01-10: .2 mg via INTRAVENOUS

## 2021-01-10 MED ORDER — PROPOFOL 10 MG/ML IV BOLUS
INTRAVENOUS | Status: DC | PRN
  Administered 2021-01-10: 25 mg via INTRAVENOUS
  Administered 2021-01-10: 20 mg via INTRAVENOUS

## 2021-01-10 SURGICAL SUPPLY — 25 items

## 2021-01-10 NOTE — Interval H&P Note (Signed)
History and Physical Interval Note:  01/10/2021 9:57 AM  Carol Page  has presented today for surgery, with the diagnosis of Iron deficiency anemia.  The various methods of treatment have been discussed with the patient and family. After consideration of risks, benefits and other options for treatment, the patient has consented to  Procedure(s): ESOPHAGOGASTRODUODENOSCOPY (EGD) WITH PROPOFOL (N/A) COLONOSCOPY WITH PROPOFOL (N/A) as a surgical intervention.  The patient's history has been reviewed, patient examined, no change in status, stable for surgery.  I have reviewed the patient's chart and labs.  Questions were answered to the patient's satisfaction.     Silvano Rusk

## 2021-01-10 NOTE — Care Management Important Message (Signed)
Important Message  Patient Details  Name: Carol Page MRN: [id number] Date of Birth: 11-27-1935   Medicare Important Message Given:  Yes - Important Message mailed due to current National Emergency   Verbal consent obtained due to current National Emergency  Relationship to patient: Self Contact Name: Janee Call Date: 01/10/21  Time: 1311 Phone: [phone] Outcome: No Answer/Busy Important Message mailed to: Patient address on file    Delorse Lek 01/10/2021, 1:11 PM

## 2021-01-10 NOTE — Plan of Care (Signed)
  Problem: Health Behavior/Discharge Planning: Goal: Ability to manage health-related needs will improve Outcome: Progressing   Problem: Activity: Goal: Risk for activity intolerance will decrease Outcome: Progressing   Problem: Nutrition: Goal: Adequate nutrition will be maintained Outcome: Progressing   Problem: Skin Integrity: Goal: Risk for impaired skin integrity will decrease Outcome: Progressing   

## 2021-01-10 NOTE — Progress Notes (Signed)
Physical Therapy Treatment Patient Details Name: Carol Page MRN: [id number] DOB: 04-02-1936 Today's Date: 01/10/2021    History of Present Illness 85 y.o. female presented to ED 01/07/21 on direction of her PCP after drop in her hemoglobin noted in blood work. In ED pt received iron infusion and 2 units of PRBC. Scheduled for EGD and colonoscopy.  PMH: Cameron ulcer, HTN, remote history of CVA on Plavix, HLD, hemorrhoids, chronic urinary incontinence, anxiety/depression, morbid obesity,    PT Comments    Focus of session was to be to progress ambulation, however pt with lethargy follow colonoscopy this morning and requires increased time for processing. Pt is min A for bed mobility, transfers and shuffling gait to recliner with RW. Pt requires increased cuing for safety. D/c plans remain appropriate at this time, as PT feels mobility will be better when pt is more alert. PT will continue to follow acutely.   Follow Up Recommendations  Home health PT;Supervision/Assistance - 24 hour     Equipment Recommendations  None recommended by PT (has necessary equipment)    Recommendations for Other Services OT consult     Precautions / Restrictions Precautions Precautions: Fall Precaution Comments: hx of falling Restrictions Weight Bearing Restrictions: No    Mobility  Bed Mobility Overal bed mobility: Needs Assistance Bed Mobility: Supine to Sit     Supine to sit: HOB elevated;Min assist     General bed mobility comments: increased time and effort to come to EoB and min A for bringing trunk to upright    Transfers Overall transfer level: Needs assistance Equipment used: Rolling walker (2 wheeled) Transfers: Sit to/from Stand Sit to Stand: Min assist         General transfer comment: min A for power up and steadying  Ambulation/Gait Ambulation/Gait assistance: Min assist Gait Distance (Feet): 3 Feet Assistive device: Rolling walker (2 wheeled) Gait Pattern/deviations:  Step-through pattern;Decreased step length - right;Decreased step length - left;Shuffle;Trunk flexed Gait velocity: slowed Gait velocity interpretation: <1.31 ft/sec, indicative of household ambulator General Gait Details: heavy minA for shuffling steps to recliner, despite vc for backing up to recliner before sitting pt short sits         Balance Overall balance assessment: Needs assistance Sitting-balance support: Feet supported;No upper extremity supported Sitting balance-Leahy Scale: Good     Standing balance support: Bilateral upper extremity supported Standing balance-Leahy Scale: Poor Standing balance comment: requires UE support for steadying                            Cognition Arousal/Alertness: Lethargic (s/p colonoscopy) Behavior During Therapy: WFL for tasks assessed/performed Overall Cognitive Status: Within Functional Limits for tasks assessed                                           General Comments General comments (skin integrity, edema, etc.): Pt is s/p colonoscopy and requires increased time for command follow, VSS on RA      Pertinent Vitals/Pain Pain Assessment: No/denies pain     PT Goals (current goals can now be found in the care plan section) Acute Rehab PT Goals PT Goal Formulation: With patient Time For Goal Achievement: 01/23/21 Potential to Achieve Goals: Good Progress towards PT goals: Not progressing toward goals - comment (lethargic from procedure this morning)    Frequency    Min 3X/week  PT Plan Current plan remains appropriate       AM-PAC PT "6 Clicks" Mobility   Outcome Measure  Help needed turning from your back to your side while in a flat bed without using bedrails?: None Help needed moving from lying on your back to sitting on the side of a flat bed without using bedrails?: A Little Help needed moving to and from a bed to a chair (including a wheelchair)?: A Little Help needed  standing up from a chair using your arms (e.g., wheelchair or bedside chair)?: A Little Help needed to walk in hospital room?: A Little Help needed climbing 3-5 steps with a railing? : A Lot 6 Click Score: 18    End of Session Equipment Utilized During Treatment: Gait belt Activity Tolerance: Patient tolerated treatment well Patient left: in chair;with call bell/phone within reach;with chair alarm set Nurse Communication: Mobility status PT Visit Diagnosis: Unsteadiness on feet (R26.81);Muscle weakness (generalized) (M62.81);Difficulty in walking, not elsewhere classified (R26.2)     Time: 7639-4320 PT Time Calculation (min) (ACUTE ONLY): 20 min  Charges:  $Therapeutic Activity: 8-22 mins                     Loriann Bosserman B. Migdalia Dk PT, DPT Acute Rehabilitation Services Pager [phone] Office [phone]    [place] 01/10/2021, 3:22 PM

## 2021-01-10 NOTE — Anesthesia Postprocedure Evaluation (Signed)
Anesthesia Post Note  Patient: Galya Mccambridge  Procedure(s) Performed: ESOPHAGOGASTRODUODENOSCOPY (EGD) WITH PROPOFOL COLONOSCOPY WITH PROPOFOL (RADIOFREQUENCY) ABLATION BIOPSY     Patient location during evaluation: PACU Anesthesia Type: MAC Level of consciousness: awake and alert Pain management: pain level controlled Vital Signs Assessment: post-procedure vital signs reviewed and stable Respiratory status: spontaneous breathing, nonlabored ventilation, respiratory function stable and patient connected to nasal cannula oxygen Cardiovascular status: stable and blood pressure returned to baseline Postop Assessment: no apparent nausea or vomiting Anesthetic complications: no   No notable events documented.  Last Vitals:  Vitals:   01/10/21 0431 01/10/21 1055  BP: (!) 112/56 (!) 121/35  Pulse: 61 85  Resp: 17 15  Temp: 36.5 C (!) 36.4 C  SpO2: 95% 100%    Last Pain:  Vitals:   01/10/21 1055  TempSrc: Temporal  PainSc: 0-No pain                 Courteney Alderete COKER

## 2021-01-10 NOTE — Plan of Care (Signed)
  Problem: Education: Goal: Knowledge of General Education information will improve Description Including pain rating scale, medication(s)/side effects and non-pharmacologic comfort measures Outcome: Progressing   Problem: Health Behavior/Discharge Planning: Goal: Ability to manage health-related needs will improve Outcome: Progressing   

## 2021-01-10 NOTE — Op Note (Addendum)
[facility] Patient Name: Carol Page Procedure Date : 01/10/2021 MRN: [id number] Attending MD: Gatha Mayer , MD Date of Birth: [birth year] CSN: [id number] Age: 85 Admit Type: Inpatient Procedure:                Upper GI endoscopy Indications:              Iron deficiency anemia Providers:                Gatha Mayer, MD, Clyde Lundborg, RN, Fransico Setters                            [place], Technician Referring MD:             Jearld Fenton Medicines:                Propofol per Anesthesia, Monitored Anesthesia Care Complications:            No immediate complications. Estimated Blood Loss:     Estimated blood loss was minimal. Procedure:                Pre-Anesthesia Assessment:                           - Prior to the procedure, a History and Physical                            was performed, and patient medications and                            allergies were reviewed. The patient's tolerance of                            previous anesthesia was also reviewed. The risks                            and benefits of the procedure and the sedation                            options and risks were discussed with the patient.                            All questions were answered, and informed consent                            was obtained. Prior Anticoagulants: The patient                            last took Plavix (clopidogrel) 3 days prior to the                            procedure. ASA Grade Assessment: IV - A patient                            with severe systemic disease that is a constant  threat to life. After reviewing the risks and                            benefits, the patient was deemed in satisfactory                            condition to undergo the procedure.                           After obtaining informed consent, the endoscope was                            passed under direct vision. Throughout the                             procedure, the patient's blood pressure, pulse, and                            oxygen saturations were monitored continuously. The                            GIF-H190 ([id number] Olympus gastroscope was                            introduced through the mouth, and advanced to the                            second part of duodenum. The upper GI endoscopy was                            accomplished without difficulty. The patient                            tolerated the procedure well. Scope In: Scope Out: Findings:      Gastric antral vascular ectasia was present in the prepyloric region of       the stomach. Focal radiofrequency ablation of Barrett's esophagus was       performed. With the endoscope in place, the position and extent of the       Barrett's mucosa and the anatomic landmarks were noted. Endoscopic       visualization identified an ablation site. The radiofrequency channel       ablation catheter was introduced through the endoscope working channel.       Barrett's tissue was targeted. The endoscope with the ablation catheter       was advanced to the areas of Barrett's mucosa. The endoscope with the       channel ablation catheter was positioned under direct visualization so       that the catheter was placed in contact with the surface of the       Barrett's mucosa. Energy was applied. The ablation catheter was removed       through the endoscope working channel. The areas of the esophagus where       Barrett's mucosa had been ablated were examined.      A small hiatal hernia was present.  The cardia and gastric fundus were normal on retroflexion.      The exam was otherwise without abnormality.      Biopsies were taken with a cold forceps in the gastric body and in the       gastric antrum for histology. Impression:               - Gastric antral vascular ectasia. Treated with                            radiofrequency ablation.                           - Small  hiatal hernia.                           - The examination was otherwise normal.                           - Biopsies were taken with a cold forceps for                            histology in the gastric body and in the gastric                            antrum. Recommendation:           - See the other procedure note for documentation of                            additional recommendations.                           - GAVE explains iron deficiency                           she was ablated - NEEDS SERIAL CBC'S THRU PCP AND                            IRON TX - IF PO DOES NOT WORK THEN NEEDS PARENTERAL                           HAD 1 DOSE FERAHEM HERE - INSURANCE MAY PRECLUDE                            GETTING THAT TYPE OF PARENTERAL IRON AS OUTPT                           I WILL F/U [facility] AND TREAT H [facility] IF +                           NO POST HOSPITAL GI APPT NEEDED AT THIS TIME                           OK TO RESTART PLAVIX IN 3 DAYS IF FELT NEEDED ? HOW  STRONG THE INDICATION IS WOULD STOP IF POSSIBLE                           COLONOSCOPY WAS INCOMPLETE BUT NEGATIVE THINK GAVE                            IS SOURCE OF ANEMIA Procedure Code(s):        --- Professional ---                           [phone], Esophagogastroduodenoscopy, flexible,                            transoral; with ablation of tumor(s), polyp(s), or                            other lesion(s) (includes pre- and post-dilation                            and guide wire passage, when performed)                           43239, 46, Esophagogastroduodenoscopy, flexible,                            transoral; with biopsy, single or multiple Diagnosis Code(s):        --- Professional ---                           V37.106, Angiodysplasia of stomach and duodenum                            without bleeding                           K44.9, Diaphragmatic hernia without obstruction or                             gangrene                           D50.9, Iron deficiency anemia, unspecified CPT copyright 2019 [employer] Association. All rights reserved. The codes documented in this report are preliminary and upon coder review may  be revised to meet current compliance requirements. Gatha Mayer, MD 01/10/2021 11:19:43 AM This report has been signed electronically. Number of Addenda: 0

## 2021-01-10 NOTE — Progress Notes (Signed)
PT Cancellation Note  Patient Details Name: Carol Page MRN: [id number] DOB: 1935/10/12   Cancelled Treatment:    Reason Eval/Treat Not Completed: (P) Patient at procedure or test/unavailable Pt is off floor for colonoscopy. PT will follow back as able.  Dravyn Severs B. Migdalia Dk PT, DPT Acute Rehabilitation Services Pager [phone] Office [phone]    [place] 01/10/2021, 10:20 AM

## 2021-01-10 NOTE — Anesthesia Procedure Notes (Signed)
Procedure Name: MAC Date/Time: 01/10/2021 10:03 AM Performed by: Renato Shin, CRNA Pre-anesthesia Checklist: Patient identified, Emergency Drugs available, Suction available and Patient being monitored Patient Re-evaluated:Patient Re-evaluated prior to induction Oxygen Delivery Method: Nasal cannula Preoxygenation: Pre-oxygenation with 100% oxygen Induction Type: IV induction Airway Equipment and Method: Bite block Placement Confirmation: positive ETCO2 and breath sounds checked- equal and bilateral Dental Injury: Teeth and Oropharynx as per pre-operative assessment

## 2021-01-10 NOTE — Transfer of Care (Signed)
Immediate Anesthesia Transfer of Care Note  Patient: Carol Page  Procedure(s) Performed: ESOPHAGOGASTRODUODENOSCOPY (EGD) WITH PROPOFOL COLONOSCOPY WITH PROPOFOL (RADIOFREQUENCY) ABLATION BIOPSY  Patient Location: PACU  Anesthesia Type:MAC  Level of Consciousness: awake and patient cooperative  Airway & Oxygen Therapy: Patient Spontanous Breathing and Patient connected to nasal cannula oxygen  Post-op Assessment: Report given to RN and Post -op Vital signs reviewed and stable  Post vital signs: Reviewed and stable  Last Vitals:  Vitals Value Taken Time  BP 121/35 01/10/21 1056  Temp    Pulse 83 01/10/21 1057  Resp 14 01/10/21 1057  SpO2 100 % 01/10/21 1057  Vitals shown include unvalidated device data.  Last Pain:  Vitals:   01/10/21 0431  TempSrc: Oral  PainSc:          Complications: No notable events documented.

## 2021-01-10 NOTE — Progress Notes (Signed)
   01/10/21 0900  OT Visit Information  Last OT Received On 01/10/21  Assistance Needed +1  Reason Eval/Treat Not Completed Patient at procedure or test/ unavailable  History of Present Illness 85 y.o. female presented to ED 01/07/21 on direction of her PCP after drop in her hemoglobin noted in blood work. In ED pt received iron infusion and 2 units of PRBC. Scheduled for EGD and colonoscopy.  PMH: Cameron ulcer, HTN, remote history of CVA on Plavix, HLD, hemorrhoids, chronic urinary incontinence, anxiety/depression, morbid obesity,    Plan to reattempt at a later time/date.  Tyrone Schimke, OT Acute Rehabilitation Services Pager: [phone] Office: [phone]

## 2021-01-10 NOTE — Op Note (Signed)
[facility] Patient Name: Carol Page Procedure Date : 01/10/2021 MRN: [id number] Attending MD: Gatha Mayer , MD Date of Birth: 11-30-1935 CSN: [id number] Age: 85 Admit Type: Inpatient Procedure:                Colonoscopy Indications:              Iron deficiency anemia Providers:                Gatha Mayer, MD, Clyde Lundborg, RN, Fransico Setters                            Mbumina, Technician Referring MD:             Jearld Fenton Medicines:                Propofol per Anesthesia, Monitored Anesthesia Care Complications:            No immediate complications. Estimated Blood Loss:     Estimated blood loss: none. Procedure:                Pre-Anesthesia Assessment:                           - Prior to the procedure, a History and Physical                            was performed, and patient medications and                            allergies were reviewed. The patient's tolerance of                            previous anesthesia was also reviewed. The risks                            and benefits of the procedure and the sedation                            options and risks were discussed with the patient.                            All questions were answered, and informed consent                            was obtained. Prior Anticoagulants: The patient                            last took Plavix (clopidogrel) 3 days prior to the                            procedure. ASA Grade Assessment: IV - A patient                            with severe systemic disease that is a constant  threat to life. After reviewing the risks and                            benefits, the patient was deemed in satisfactory                            condition to undergo the procedure.                           After obtaining informed consent, the colonoscope                            was passed under direct vision. Throughout the                             procedure, the patient's blood pressure, pulse, and                            oxygen saturations were monitored continuously. The                            CF-HQ190L ([id number]) Olympus colonoscope was                            introduced through the anus with the intention of                            advancing to the cecum. The scope was advanced to                            the transverse colon before the procedure was                            aborted. Medications were given. The colonoscopy                            was performed with difficulty due to significant                            looping. The patient tolerated the procedure well.                            The quality of the bowel preparation was good. The                            rectum was photographed. Scope In: 10:29:01 AM Scope Out: 10:47:06 AM Total Procedure Duration: 0 hours 18 minutes 5 seconds  Findings:      The perianal and digital rectal examinations were normal.      Multiple diverticula were found in the sigmoid colon and descending       colon.      Internal hemorrhoids were found. Impression:               - Diverticulosis in the sigmoid colon and in the  descending colon.                           - Internal hemorrhoids.                           - No specimens collected. Recommendation:           - See the other procedure note for documentation of                            additional recommendations. Procedure Code(s):        --- Professional ---                           [phone], 16, Colonoscopy, flexible; diagnostic,                            including collection of specimen(s) by brushing or                            washing, when performed (separate procedure) Diagnosis Code(s):        --- Professional ---                           K64.8, Other hemorrhoids                           D50.9, Iron deficiency anemia, unspecified                           K57.30,  Diverticulosis of large intestine without                            perforation or abscess without bleeding CPT copyright 2019 [employer] Association. All rights reserved. The codes documented in this report are preliminary and upon coder review may  be revised to meet current compliance requirements. Gatha Mayer, MD 01/10/2021 11:24:11 AM This report has been signed electronically. Number of Addenda: 0

## 2021-01-10 NOTE — Progress Notes (Signed)
PROGRESS NOTE    Carol Page  QIH:[id number] DOB: January 21, 1936 DOA: 01/07/2021 PCP: Jearld Fenton, NP    Brief Narrative:  85 y/o female admitted to the hospital with fatigue and worsening anemia. She was transfused prbc with improvement in symptoms. GI following for EGD/colonoscopy   Assessment & Plan:   Active Problems:   Essential hypertension   HLD (hyperlipidemia)   Seizure disorder (HCC)   AKI (acute kidney injury) ([place])   GI bleed   Absolute anemia   CKD (chronic kidney disease) stage 3, GFR 30-59 ml/min (HCC)   Obesity, Class III, BMI 40-49.9 (morbid obesity) (HCC)   GAVE (gastric antral vascular ectasia)   Acute on chronic anemia -concern for underlying GI bleed -she was transfused 2 unit prbc on admission -baseline hemoglobin around 10 -she was admitted with hemoglobin of 7 -post transfusion hemoglobin improved to 10.6->8.8->9.4 -patient had EGD/colonoscopy and it was felt that GAVE was cause of GI bleeding -this was treated with radiofrequency ablation -continue PPI  AKI on CKD 3b -baseline creatinine 1.3 -on admission, creatinine 1.8->2.3->2.5->2.0 ?related to ATN from anemia -holding lisinopril and HCTZ -continue gentle fluids overnight -renal ultrasound showed mild right sided hydronephrosis -this was confirmed on CT abd, but no obstructing stone or lesion was identified -since renal function is improving, an consider repeat renal ultrasound in 1 month to ensure resolution -continue to follow  Seizure disorder -continue on dilantin  HTN -lisinopril/hctz currently on hold -diltiazem changed to short acting -BP currently stable, continue to monitor  HLD -continue statin  Obesity, Class III BMI 43.7 -increased risk of morbidity and mortality  History of CVA -on chronic aspirin and plavix -these have been held in light of concerns for bleeding -can resume after 3 days  Peripheral edema -suspect that she may have an element of chronic  edema from venous stasis -echo shows normal EF -currently leg swelling appears to be near baseline -continue to monitor     DVT prophylaxis: SCDs Start: 01/07/21 1557  Code Status: DNR Family Communication: discussed with patient's daughter Disposition Plan: Status is: Inpatient  Remains inpatient appropriate because:Ongoing diagnostic testing needed not appropriate for outpatient work up and Inpatient level of care appropriate due to severity of illness  Dispo: The patient is from: Home              Anticipated d/c is to: Home              Patient currently is not medically stable to d/c.   Difficult to place patient No         Consultants:  GI  Procedures:  Echo, preserved EF, grade 1 diastolic dysfunction Colonoscopy: - Diverticulosis in the sigmoid colon and in the                           descending colon.                           - Internal hemorrhoids.                           - No specimens collected EGD: - Gastric antral vascular ectasia. Treated with                           radiofrequency ablation.                           -  Small hiatal hernia.                           - The examination was otherwise normal.                           - Biopsies were taken with a cold forceps for                           histology in the gastric body and in the gastric                           antrum  Antimicrobials:      Subjective: No shortness of breath, no abdominal pain  Objective: Vitals:   01/10/21 1115 01/10/21 1125 01/10/21 1208 01/10/21 1637  BP: (!) 145/42 (!) 142/38 (!) 150/68 (!) 124/55  Pulse: 79 78 68 68  Resp: 14 13 18 16   Temp:   97.6 F (36.4 C) 98 F (36.7 C)  TempSrc:   Oral Oral  SpO2: 99% 99% 93% 93%  Weight:      Height:        Intake/Output Summary (Last 24 hours) at 01/10/2021 1738 Last data filed at 01/10/2021 1043 Gross per 24 hour  Intake 400 ml  Output --  Net 400 ml   Filed Weights   01/07/21 1420  Weight: 108.4  kg    Examination:  General exam: Alert, awake, oriented x 3 Respiratory system: Clear to auscultation. Respiratory effort normal. Cardiovascular system:RRR. No murmurs, rubs, gallops. Gastrointestinal system: Abdomen is nondistended, soft and nontender. No organomegaly or masses felt. Normal bowel sounds heard. Central nervous system: Alert and oriented. No focal neurological deficits. Extremities: 1+ edema bilaterally Skin: No rashes, lesions or ulcers Psychiatry: Judgement and insight appear normal. Mood & affect appropriate.      Data Reviewed: I have personally reviewed following labs and imaging studies  CBC: Recent Labs  Lab 01/06/21 0943 01/07/21 1200 01/08/21 0645 01/09/21 0343 01/10/21 0339  WBC 6.8 6.9 12.0* 10.5 9.2  NEUTROABS 4,862  --   --   --   --   HGB 7.2* 7.0* 10.6* 8.8* 9.4*  HCT 23.8* 23.5* 33.1* 27.0* 29.5*  MCV 93.7 94.8 89.5 88.2 88.9  PLT 242 254 245 188 937   Basic Metabolic Panel: Recent Labs  Lab 01/07/21 1200 01/08/21 0444 01/08/21 2239 01/09/21 0343 01/10/21 0339  NA 137 136  --  137 139  K 4.8 5.2* 4.3 4.1 4.3  CL 105 108  --  106 102  CO2 20* 18*  --  23 27  GLUCOSE 102* 126*  --  101* 81  BUN 37* 42*  --  39* 34*  CREATININE 1.85* 2.30*  --  2.58* 2.06*  CALCIUM 8.7* 8.5*  --  7.8* 8.5*  PHOS  --   --   --  4.0 3.3   GFR: Estimated Creatinine Clearance: 24 mL/min (A) (by C-G formula based on SCr of 2.06 mg/dL (H)). Liver Function Tests: Recent Labs  Lab 01/07/21 1200 01/09/21 0343 01/10/21 0339  AST 17  --   --   ALT 11  --   --   ALKPHOS 67  --   --   BILITOT 0.4  --   --   PROT 6.1*  --   --   ALBUMIN 3.2*  2.6* 2.9*   No results for input(s): LIPASE, AMYLASE in the last 168 hours. No results for input(s): AMMONIA in the last 168 hours. Coagulation Profile: No results for input(s): INR, PROTIME in the last 168 hours. Cardiac Enzymes: No results for input(s): CKTOTAL, CKMB, CKMBINDEX, TROPONINI in the last 168  hours. BNP (last 3 results) No results for input(s): PROBNP in the last 8760 hours. HbA1C: No results for input(s): HGBA1C in the last 72 hours. CBG: No results for input(s): GLUCAP in the last 168 hours. Lipid Profile: No results for input(s): CHOL, HDL, LDLCALC, TRIG, CHOLHDL, LDLDIRECT in the last 72 hours. Thyroid Function Tests: No results for input(s): TSH, T4TOTAL, FREET4, T3FREE, THYROIDAB in the last 72 hours. Anemia Panel: No results for input(s): VITAMINB12, FOLATE, FERRITIN, TIBC, IRON, RETICCTPCT in the last 72 hours.  Sepsis Labs: No results for input(s): PROCALCITON, LATICACIDVEN in the last 168 hours.  Recent Results (from the past 240 hour(s))  SARS CORONAVIRUS 2 (TAT 6-24 HRS) Nasopharyngeal Nasopharyngeal Swab     Status: None   Collection Time: 01/09/21  3:54 AM   Specimen: Nasopharyngeal Swab  Result Value Ref Range Status   SARS Coronavirus 2 NEGATIVE NEGATIVE Final    Comment: (NOTE) SARS-CoV-2 target nucleic acids are NOT DETECTED.  The SARS-CoV-2 RNA is generally detectable in upper and lower respiratory specimens during the acute phase of infection. Negative results do not preclude SARS-CoV-2 infection, do not rule out co-infections with other pathogens, and should not be used as the sole basis for treatment or other patient management decisions. Negative results must be combined with clinical observations, patient history, and epidemiological information. The expected result is Negative.  Fact Sheet for Patients: SugarRoll.be  Fact Sheet for Healthcare Providers: https://www.woods-mathews.com/  This test is not yet approved or cleared by the [place] FDA and  has been authorized for detection and/or diagnosis of SARS-CoV-2 by FDA under an Emergency Use Authorization (EUA). This EUA will remain  in effect (meaning this test can be used) for the duration of the COVID-19 declaration under Se ction  564(b)(1) of the Act, 21 U.S.C. section 360bbb-3(b)(1), unless the authorization is terminated or revoked sooner.  Performed at [facility], [place] [address], [place], [place] [address]   Resp Panel by RT-PCR (Flu A&B, Covid) Nasopharyngeal Swab     Status: None   Collection Time: 01/09/21  7:08 AM   Specimen: Nasopharyngeal Swab; Nasopharyngeal(NP) swabs in vial transport medium  Result Value Ref Range Status   SARS Coronavirus 2 by RT PCR NEGATIVE NEGATIVE Final    Comment: (NOTE) SARS-CoV-2 target nucleic acids are NOT DETECTED.  The SARS-CoV-2 RNA is generally detectable in upper respiratory specimens during the acute phase of infection. The lowest concentration of SARS-CoV-2 viral copies this assay can detect is 138 copies/mL. A negative result does not preclude SARS-Cov-2 infection and should not be used as the sole basis for treatment or other patient management decisions. A negative result may occur with  improper specimen collection/handling, submission of specimen other than nasopharyngeal swab, presence of viral mutation(s) within the areas targeted by this assay, and inadequate number of viral copies(<138 copies/mL). A negative result must be combined with clinical observations, patient history, and epidemiological information. The expected result is Negative.  Fact Sheet for Patients:  EntrepreneurPulse.com.au  Fact Sheet for Healthcare Providers:  IncredibleEmployment.be  This test is no t yet approved or cleared by the [place] FDA and  has been authorized for detection and/or diagnosis of  SARS-CoV-2 by FDA under an Emergency Use Authorization (EUA). This EUA will remain  in effect (meaning this test can be used) for the duration of the COVID-19 declaration under Section 564(b)(1) of the Act, 21 U.S.C.section 360bbb-3(b)(1), unless the authorization is terminated  or revoked sooner.       Influenza A by PCR  NEGATIVE NEGATIVE Final   Influenza B by PCR NEGATIVE NEGATIVE Final    Comment: (NOTE) The Xpert Xpress SARS-CoV-2/FLU/RSV plus assay is intended as an aid in the diagnosis of influenza from Nasopharyngeal swab specimens and should not be used as a sole basis for treatment. Nasal washings and aspirates are unacceptable for Xpert Xpress SARS-CoV-2/FLU/RSV testing.  Fact Sheet for Patients: EntrepreneurPulse.com.au  Fact Sheet for Healthcare Providers: IncredibleEmployment.be  This test is not yet approved or cleared by the [place] FDA and has been authorized for detection and/or diagnosis of SARS-CoV-2 by FDA under an Emergency Use Authorization (EUA). This EUA will remain in effect (meaning this test can be used) for the duration of the COVID-19 declaration under Section 564(b)(1) of the Act, 21 U.S.C. section 360bbb-3(b)(1), unless the authorization is terminated or revoked.  Performed at [facility], [place] [address], [place], [place] [address]          Radiology Studies: US RENAL  Result Date: 01/09/2021 CLINICAL DATA:  Acute renal injury EXAM: RENAL / URINARY TRACT ULTRASOUND COMPLETE COMPARISON:  04/19/2018 FINDINGS: Right Kidney: Renal measurements: 8.1 x 4.4 x 4.8 cm. = volume: 91 mL. 2.5 cm cyst is noted in the upper pole of the right kidney. No mass lesion or hydronephrosis is noted. Left Kidney: Renal measurements: 11.4 x 5.3 x 5.4 cm. = volume: 170 mL. Mild hydronephrosis is noted. Previous CT showed a knee left renal calculus. It is possible the hydronephrosis is related to interval migration of this stone. CT urography may be helpful for further evaluation. Bladder: Appears normal for degree of bladder distention. Other: None. IMPRESSION: Mild left hydronephrosis new from prior CT examination. Interval stone migration may be the etiology. CT urography may be helpful. Right renal cyst stable from prior CT. Electronically  Signed   By: Inez Catalina M.D.   On: 01/09/2021 18:44   CT RENAL STONE STUDY  Result Date: 01/09/2021 CLINICAL DATA:  85 year old female with acute renal failure. EXAM: CT ABDOMEN AND PELVIS WITHOUT CONTRAST TECHNIQUE: Multidetector CT imaging of the abdomen and pelvis was performed following the standard protocol without IV contrast. COMPARISON:  CT of the pelvis dated 04/19/2018. FINDINGS: Evaluation of this exam is limited in the absence of intravenous contrast. Lower chest: Several scattered pulmonary nodules at the right lung base measure up to 4 mm. There are bibasilar linear atelectasis/scarring. Calcification of the mitral annulus. No intra-abdominal free air or free fluid. Hepatobiliary: The liver is unremarkable. No intrahepatic biliary dilatation. No calcified gallstone or pericholecystic fluid. Pancreas: Unremarkable. No pancreatic ductal dilatation or surrounding inflammatory changes. Spleen: Normal in size without focal abnormality. Adrenals/Urinary Tract: The adrenal glands unremarkable. Mild bilateral renal parenchyma atrophy. There is mild left hydronephroureter. No stone identified. There is a 15 mm right renal upper pole hypodense lesion, likely a cyst. There is no hydronephrosis or nephrolithiasis on the right. The urinary bladder is unremarkable. Stomach/Bowel: There is a small hiatal hernia. There is no bowel obstruction or active inflammation. There is sigmoid diverticulosis without active inflammatory changes. The appendix is normal. Vascular/Lymphatic: Advanced aortoiliac atherosclerotic disease. The IVC is unremarkable no portal venous gas. There is no  adenopathy. There is mild haziness of the mesentery with multiple top-normal lymph nodes with a "misty mesentery" appearance. This finding is nonspecific but may be related to underlying inflammatory/infectious etiology. Reproductive: The uterus is anteverted and grossly unremarkable. No adnexal masses. Other: None Musculoskeletal:  Osteopenia with multilevel degenerative changes. Bilateral L5 pars defects with grade 1 L5-S1 anterolisthesis. No acute osseous pathology. IMPRESSION: 1. Mild left hydronephroureter. No stone identified. Findings may be related to a recently passed left renal calculus or UTI. Correlation with urinalysis recommended. 2. Sigmoid diverticulosis. No bowel obstruction. Normal appendix. 3. Aortic Atherosclerosis (ICD10-I70.0). Electronically Signed   By: Anner Crete M.D.   On: 01/09/2021 22:26        Scheduled Meds:  atorvastatin  80 mg Oral Daily   cholecalciferol  1,000 Units Oral Daily   diltiazem  30 mg Oral Q6H   escitalopram  10 mg Oral Daily   gabapentin  300 mg Oral BID   loratadine  10 mg Oral Daily   pantoprazole (PROTONIX) IV  40 mg Intravenous Q12H   senna-docusate  1 tablet Oral BID   sodium zirconium cyclosilicate  10 g Oral Once   traZODone  150 mg Oral QHS   vitamin B-12  1,000 mcg Oral Daily   Continuous Infusions:  iron sucrose Stopped (01/08/21 1112)   lactated ringers 50 mL/hr at 01/08/21 2215     LOS: 3 days    Time spent: 51mins    Kathie Dike, MD [facility]   If 7PM-7AM, please contact night-coverage [email or web address]  01/10/2021, 5:38 PM

## 2021-01-11 DIAGNOSIS — Z66 Do not resuscitate: Secondary | ICD-10-CM | POA: Diagnosis present

## 2021-01-11 LAB — BASIC METABOLIC PANEL
Anion gap: 6 (ref 5–15)
BUN: 25 mg/dL — ABNORMAL HIGH (ref 8–23)
CO2: 27 mmol/L (ref 22–32)
Calcium: 8.3 mg/dL — ABNORMAL LOW (ref 8.9–10.3)
Chloride: 106 mmol/L (ref 98–111)
Creatinine, Ser: 1.71 mg/dL — ABNORMAL HIGH (ref 0.44–1.00)
GFR, Estimated: 29 mL/min — ABNORMAL LOW (ref >60)
Glucose, Bld: 85 mg/dL (ref 70–99)
Potassium: 4 mmol/L (ref 3.5–5.1)
Sodium: 139 mmol/L (ref 135–145)

## 2021-01-11 MED ORDER — POLYETHYLENE GLYCOL 3350 17 G PO PACK: 17.0000 g | PACK | Freq: Every day | ORAL | 0 refills | Status: AC | PRN

## 2021-01-11 MED ORDER — CLOPIDOGREL BISULFATE 75 MG PO TABS: 1.0000 | ORAL_TABLET | Freq: Every evening | ORAL | 0 refills | Status: DC

## 2021-01-11 MED ORDER — PANTOPRAZOLE SODIUM 40 MG PO TBEC: 40.0000 mg | DELAYED_RELEASE_TABLET | Freq: Every day | ORAL | 0 refills | Status: DC

## 2021-01-11 MED ORDER — PANTOPRAZOLE SODIUM 40 MG PO TBEC
40.0000 mg | DELAYED_RELEASE_TABLET | Freq: Two times a day (BID) | ORAL | Status: DC
  Administered 2021-01-11: 40 mg via ORAL
  Filled 2021-01-11: qty 1

## 2021-01-11 NOTE — Discharge Summary (Signed)
Physician Discharge Summary  Mai Longnecker YTK:[id number] DOB: [birth year] DOA: 01/07/2021  PCP: Jearld Fenton, NP  Admit date: 01/07/2021 Discharge date: 01/11/2021  Admitted From: Home Disposition: Home  Recommendations for Outpatient Follow-up:  Follow up with PCP in 1-2 weeks Please obtain BMP/CBC in one week Lisinopril has been held due to elevated creatinine, this can be resumed in the outpatient setting as felt appropriate  Home Health: Home health PT Equipment/Devices:  Discharge Condition: Stable CODE STATUS: DNR Diet recommendation: Heart healthy  Brief/Interim Summary: 85 year old female admitted to the hospital with fatigue and worsening anemia.  She was noted to have dark-colored stools.  She was transfused PRBC with improvement of symptoms.  She also developed acute kidney injury during her hospital stay.  She was seen by GI and underwent EGD that showed GAVE that was treated with radiofrequency ablation.  Discharge Diagnoses:  Active Problems:   Essential hypertension   HLD (hyperlipidemia)   Seizure disorder (HCC)   AKI (acute kidney injury) ([place])   GI bleed   Absolute anemia   CKD (chronic kidney disease) stage 3, GFR 30-59 ml/min (HCC)   Obesity, Class III, BMI 40-49.9 (morbid obesity) ([place])   GAVE (gastric antral vascular ectasia)   DNR (do not resuscitate)  Acute on chronic anemia -concern for underlying GI bleed -she was transfused 2 unit prbc on admission -baseline hemoglobin around 10 -she was admitted with hemoglobin of 7 -post transfusion hemoglobin improved to 10.6->8.8->9.4 -patient had EGD/colonoscopy and it was felt that GAVE was cause of GI bleeding -this was treated with radiofrequency ablation -continue PPI   AKI on CKD 3b -baseline creatinine 1.3 -on admission, creatinine 1.8->2.3->2.5->2.0->1.7 ?related to ATN from anemia in the setting of ACE inhibitor -holding lisinopril and HCTZ -Renal function responded to gentle IV  hydration -renal ultrasound showed mild right sided hydronephrosis -this was confirmed on CT abd, but no obstructing stone or lesion was identified -since renal function is improving, an consider repeat renal ultrasound in 1 month to ensure resolution -continue to follow -We will continue to hold ACE inhibitor until follow-up with primary care physician -We will resume HCTZ since she does have lower extremity edema   Seizure disorder -continue on dilantin   HTN -lisinopril/hctz currently on hold -resume diltiazem on discharge -BP currently stable, continue to monitor   HLD -continue statin   Obesity, Class III BMI 43.7 -increased risk of morbidity and mortality   History of CVA -on chronic plavix -these have been held in light of concerns for bleeding -can resume on 6/27   Peripheral edema -suspect that she may have an element of chronic edema from venous stasis -echo shows normal EF -currently leg swelling appears to be near baseline -continue to monitor  Discharge Instructions  Discharge Instructions     Diet - low sodium heart healthy   Complete by: As directed    Discharge wound care:   Complete by: As directed    Plasce barrier cream such as desitin over groin area twice a day until cleared   Increase activity slowly   Complete by: As directed       Allergies as of 01/11/2021       Reactions   Codeine Nausea And Vomiting   Ether Nausea And Vomiting        Medication List     STOP taking these medications    hydrocortisone 2.5 % rectal cream Commonly known as: ANUSOL-HC   lisinopril 10 MG tablet Commonly known as: ZESTRIL  nystatin powder Generic drug: nystatin   senna-docusate 8.6-50 MG tablet Commonly known as: Senokot-S       TAKE these medications    atorvastatin 80 MG tablet Commonly known as: LIPITOR Take 1 tablet by mouth once daily   bisacodyl 5 MG EC tablet Commonly known as: DULCOLAX Take 2 tablets (10 mg total) by  mouth daily as needed for moderate constipation.   CALCIUM PO Take 1 tablet by mouth daily.   cholecalciferol 1000 units tablet Commonly known as: VITAMIN D Take 1,000 Units by mouth daily.   clopidogrel 75 MG tablet Commonly known as: PLAVIX Take 1 tablet (75 mg total) by mouth every evening. Resume on 6/27 What changed: additional instructions   diltiazem 180 MG 24 hr capsule Commonly known as: CARDIZEM CD Take 1 capsule by mouth once daily   escitalopram 10 MG tablet Commonly known as: LEXAPRO Take 1 tablet by mouth once daily   gabapentin 300 MG capsule Commonly known as: NEURONTIN Take 1 capsule by mouth twice daily   hydrochlorothiazide 12.5 MG capsule Commonly known as: MICROZIDE Take 1 capsule by mouth once daily   Krill Oil 500 MG Caps Take 500 mg by mouth in the morning and at bedtime.   loratadine 10 MG tablet Commonly known as: CLARITIN Take 10 mg by mouth daily.   pantoprazole 40 MG tablet Commonly known as: PROTONIX Take 1 tablet (40 mg total) by mouth daily.   phenytoin 50 MG tablet Commonly known as: DILANTIN Take 2 tablets by mouth once daily   polyethylene glycol 17 g packet Commonly known as: MIRALAX / GLYCOLAX Take 17 g by mouth daily as needed for mild constipation. What changed:  when to take this reasons to take this   potassium chloride SA 20 MEQ tablet Commonly known as: KLOR-CON TAKE 1  BY MOUTH ONCE DAILY What changed:  how much to take how to take this when to take this additional instructions   traZODone 50 MG tablet Commonly known as: DESYREL TAKE 3 TABLETS BY MOUTH AT BEDTIME   vitamin B-12 1000 MCG tablet Commonly known as: CYANOCOBALAMIN Take 1,000 mcg by mouth daily.               Discharge Care Instructions  (From admission, onward)           Start     Ordered   01/11/21 0000  Discharge wound care:       Comments: Plasce barrier cream such as desitin over groin area twice a day until cleared    01/11/21 1032            Allergies  Allergen Reactions   Codeine Nausea And Vomiting   Ether Nausea And Vomiting    Consultations: Gastroenterology   Procedures/Studies: DG Chest 1 View  Result Date: 01/07/2021 CLINICAL DATA:  Anemia EXAM: CHEST  1 VIEW COMPARISON:  Portable exam 1635 hours compared to 08/19/2015 FINDINGS: Borderline enlargement of cardiac silhouette with mitral annular calcification. Mediastinal contours and pulmonary vascularity normal. Atherosclerotic calcification aorta. Atelectasis versus consolidation RIGHT middle lobe with loss of RIGHT heart border. Remaining lungs clear. No pleural effusion or pneumothorax. Bones demineralized. IMPRESSION: Atelectasis versus consolidation in RIGHT middle lobe. Electronically Signed   By: Lavonia Dana M.D.   On: 01/07/2021 16:50   US RENAL  Result Date: 01/09/2021 CLINICAL DATA:  Acute renal injury EXAM: RENAL / URINARY TRACT ULTRASOUND COMPLETE COMPARISON:  04/19/2018 FINDINGS: Right Kidney: Renal measurements: 8.1 x 4.4 x 4.8 cm. = volume:  91 mL. 2.5 cm cyst is noted in the upper pole of the right kidney. No mass lesion or hydronephrosis is noted. Left Kidney: Renal measurements: 11.4 x 5.3 x 5.4 cm. = volume: 170 mL. Mild hydronephrosis is noted. Previous CT showed a knee left renal calculus. It is possible the hydronephrosis is related to interval migration of this stone. CT urography may be helpful for further evaluation. Bladder: Appears normal for degree of bladder distention. Other: None. IMPRESSION: Mild left hydronephrosis new from prior CT examination. Interval stone migration may be the etiology. CT urography may be helpful. Right renal cyst stable from prior CT. Electronically Signed   By: Inez Catalina M.D.   On: 01/09/2021 18:44   ECHOCARDIOGRAM COMPLETE  Result Date: 01/08/2021    ECHOCARDIOGRAM REPORT   Patient Name:   Carol Page Date of Exam: 01/08/2021 Medical Rec #:  [id number]      Height:       62.0 in  Accession #:    [id number]     Weight:       239.0 lb Date of Birth:  1935-11-06       BSA:          2.062 m Patient Age:    85 years       BP:           76/62 mmHg Patient Gender: F              HR:           67 bpm. Exam Location:  Inpatient Procedure: 2D Echo, Cardiac Doppler and Color Doppler Indications:    R01.1 Murmur  History:        Patient has no prior history of Echocardiogram examinations.                 Stroke; Risk Factors:Hypertension and Dyslipidemia.  Sonographer:    Jonelle Sidle Dance Referring Phys: [id number] [place]  1. Left ventricular ejection fraction, by estimation, is 65 to 70%. The left ventricle has normal function. The left ventricle has no regional wall motion abnormalities. Left ventricular diastolic parameters are consistent with Grade I diastolic dysfunction (impaired relaxation). Elevated left ventricular end-diastolic pressure.  2. Right ventricular systolic function is normal. The right ventricular size is normal. There is normal pulmonary artery systolic pressure.  3. Left atrial size was mildly dilated.  4. The mitral valve is normal in structure. No evidence of mitral valve regurgitation. No evidence of mitral stenosis. Severe mitral annular calcification.  5. Partial fusion of the left and non-coronary cusps. The aortic valve is calcified. Aortic valve regurgitation is mild to moderate. Mild aortic valve stenosis.  6. The inferior vena cava is normal in size with greater than 50% respiratory variability, suggesting right atrial pressure of 3 mmHg. FINDINGS  Left Ventricle: Left ventricular ejection fraction, by estimation, is 65 to 70%. The left ventricle has normal function. The left ventricle has no regional wall motion abnormalities. The left ventricular internal cavity size was normal in size. There is  no left ventricular hypertrophy. Left ventricular diastolic parameters are consistent with Grade I diastolic dysfunction (impaired relaxation). Elevated left  ventricular end-diastolic pressure. Right Ventricle: The right ventricular size is normal. No increase in right ventricular wall thickness. Right ventricular systolic function is normal. There is normal pulmonary artery systolic pressure. The tricuspid regurgitant velocity is 2.55 m/s, and  with an assumed right atrial pressure of 3 mmHg, the estimated right ventricular systolic pressure is 29.7 mmHg.  Left Atrium: Left atrial size was mildly dilated. Right Atrium: Right atrial size was normal in size. Pericardium: There is no evidence of pericardial effusion. Mitral Valve: The mitral valve is normal in structure. Severe mitral annular calcification. No evidence of mitral valve regurgitation. No evidence of mitral valve stenosis. MV peak gradient, 8.6 mmHg. The mean mitral valve gradient is 4.0 mmHg. Tricuspid Valve: The tricuspid valve is normal in structure. Tricuspid valve regurgitation is trivial. No evidence of tricuspid stenosis. Aortic Valve: Partial fusion of the left and non-coronary cusps. The aortic valve is calcified. Aortic valve regurgitation is mild to moderate. Mild aortic stenosis is present. Aortic valve mean gradient measures 15.0 mmHg. Aortic valve peak gradient measures 25.8 mmHg. Aortic valve area, by VTI measures 1.81 cm. Pulmonic Valve: The pulmonic valve was normal in structure. Pulmonic valve regurgitation is not visualized. No evidence of pulmonic stenosis. Aorta: The aortic root is normal in size and structure. Venous: The inferior vena cava is normal in size with greater than 50% respiratory variability, suggesting right atrial pressure of 3 mmHg. IAS/Shunts: No atrial level shunt detected by color flow Doppler.  LEFT VENTRICLE PLAX 2D LVIDd:         4.90 cm  Diastology LVIDs:         3.00 cm  LV e' medial:    5.47 cm/s LV PW:         1.00 cm  LV E/e' medial:  23.9 LV IVS:        1.00 cm  LV e' lateral:   6.84 cm/s LVOT diam:     2.00 cm  LV E/e' lateral: 19.2 LV SV:         101 LV SV  Index:   49 LVOT Area:     3.14 cm  RIGHT VENTRICLE             IVC RV Basal diam:  2.90 cm     IVC diam: 1.90 cm RV S prime:     16.30 cm/s TAPSE (M-mode): 2.5 cm LEFT ATRIUM             Index       RIGHT ATRIUM           Index LA diam:        3.60 cm 1.75 cm/m  RA Area:     15.80 cm LA Vol (A2C):   77.6 ml 37.63 ml/m RA Volume:   36.50 ml  17.70 ml/m LA Vol (A4C):   58.7 ml 28.47 ml/m LA Biplane Vol: 68.3 ml 33.12 ml/m  AORTIC VALVE AV Area (Vmax):    1.76 cm AV Area (Vmean):   1.82 cm AV Area (VTI):     1.81 cm AV Vmax:           254.00 cm/s AV Vmean:          162.800 cm/s AV VTI:            0.559 m AV Peak Grad:      25.8 mmHg AV Mean Grad:      15.0 mmHg LVOT Vmax:         142.50 cm/s LVOT Vmean:        94.350 cm/s LVOT VTI:          0.322 m LVOT/AV VTI ratio: 0.58  AORTA Ao Root diam: 3.30 cm Ao Asc diam:  2.80 cm MITRAL VALVE                TRICUSPID  VALVE MV Area (PHT): 1.79 cm     TR Peak grad:   26.0 mmHg MV Area VTI:   1.97 cm     TR Vmax:        255.00 cm/s MV Peak grad:  8.6 mmHg MV Mean grad:  4.0 mmHg     SHUNTS MV Vmax:       1.47 m/s     Systemic VTI:  0.32 m MV Vmean:      95.7 cm/s    Systemic Diam: 2.00 cm MV Decel Time: 423 msec MV E velocity: 131.00 cm/s MV A velocity: 143.00 cm/s MV E/A ratio:  0.92 Skeet Latch MD Electronically signed by Skeet Latch MD Signature Date/Time: 01/08/2021/4:56:24 PM    Final    CT RENAL STONE STUDY  Result Date: 01/09/2021 CLINICAL DATA:  85 year old female with acute renal failure. EXAM: CT ABDOMEN AND PELVIS WITHOUT CONTRAST TECHNIQUE: Multidetector CT imaging of the abdomen and pelvis was performed following the standard protocol without IV contrast. COMPARISON:  CT of the pelvis dated 04/19/2018. FINDINGS: Evaluation of this exam is limited in the absence of intravenous contrast. Lower chest: Several scattered pulmonary nodules at the right lung base measure up to 4 mm. There are bibasilar linear atelectasis/scarring. Calcification of  the mitral annulus. No intra-abdominal free air or free fluid. Hepatobiliary: The liver is unremarkable. No intrahepatic biliary dilatation. No calcified gallstone or pericholecystic fluid. Pancreas: Unremarkable. No pancreatic ductal dilatation or surrounding inflammatory changes. Spleen: Normal in size without focal abnormality. Adrenals/Urinary Tract: The adrenal glands unremarkable. Mild bilateral renal parenchyma atrophy. There is mild left hydronephroureter. No stone identified. There is a 15 mm right renal upper pole hypodense lesion, likely a cyst. There is no hydronephrosis or nephrolithiasis on the right. The urinary bladder is unremarkable. Stomach/Bowel: There is a small hiatal hernia. There is no bowel obstruction or active inflammation. There is sigmoid diverticulosis without active inflammatory changes. The appendix is normal. Vascular/Lymphatic: Advanced aortoiliac atherosclerotic disease. The IVC is unremarkable no portal venous gas. There is no adenopathy. There is mild haziness of the mesentery with multiple top-normal lymph nodes with a "misty mesentery" appearance. This finding is nonspecific but may be related to underlying inflammatory/infectious etiology. Reproductive: The uterus is anteverted and grossly unremarkable. No adnexal masses. Other: None Musculoskeletal: Osteopenia with multilevel degenerative changes. Bilateral L5 pars defects with grade 1 L5-S1 anterolisthesis. No acute osseous pathology. IMPRESSION: 1. Mild left hydronephroureter. No stone identified. Findings may be related to a recently passed left renal calculus or UTI. Correlation with urinalysis recommended. 2. Sigmoid diverticulosis. No bowel obstruction. Normal appendix. 3. Aortic Atherosclerosis (ICD10-I70.0). Electronically Signed   By: Anner Crete M.D.   On: 01/09/2021 22:26      Subjective: Patient does not have any complaints at this time.  No abdominal pain, nausea or vomiting.  No shortness of  breath.  Discharge Exam: Vitals:   01/10/21 1637 01/10/21 2116 01/11/21 0122 01/11/21 0510  BP: (!) 124/55 130/69 (!) 127/54 (!) 115/56  Pulse: 68 64 63 60  Resp: 16 16 16 16   Temp: 98 F (36.7 C) 97.9 F (36.6 C) (!) 97.5 F (36.4 C) (!) 97.5 F (36.4 C)  TempSrc: Oral Oral Oral Oral  SpO2: 93% 97% 94% 94%  Weight:      Height:        General: Pt is alert, awake, not in acute distress Cardiovascular: RRR, S1/S2 +, no rubs, no gallops Respiratory: CTA bilaterally, no wheezing, no rhonchi Abdominal: Soft, NT,  ND, bowel sounds + Extremities: no edema, no cyanosis    The results of significant diagnostics from this hospitalization (including imaging, microbiology, ancillary and laboratory) are listed below for reference.     Microbiology: Recent Results (from the past 240 hour(s))  SARS CORONAVIRUS 2 (TAT 6-24 HRS) Nasopharyngeal Nasopharyngeal Swab     Status: None   Collection Time: 01/09/21  3:54 AM   Specimen: Nasopharyngeal Swab  Result Value Ref Range Status   SARS Coronavirus 2 NEGATIVE NEGATIVE Final    Comment: (NOTE) SARS-CoV-2 target nucleic acids are NOT DETECTED.  The SARS-CoV-2 RNA is generally detectable in upper and lower respiratory specimens during the acute phase of infection. Negative results do not preclude SARS-CoV-2 infection, do not rule out co-infections with other pathogens, and should not be used as the sole basis for treatment or other patient management decisions. Negative results must be combined with clinical observations, patient history, and epidemiological information. The expected result is Negative.  Fact Sheet for Patients: SugarRoll.be  Fact Sheet for Healthcare Providers: https://www.woods-mathews.com/  This test is not yet approved or cleared by the [place] FDA and  has been authorized for detection and/or diagnosis of SARS-CoV-2 by FDA under an Emergency Use Authorization  (EUA). This EUA will remain  in effect (meaning this test can be used) for the duration of the COVID-19 declaration under Se ction 564(b)(1) of the Act, 21 U.S.C. section 360bbb-3(b)(1), unless the authorization is terminated or revoked sooner.  Performed at [facility], [place] [address], [place], [place] [address]   Resp Panel by RT-PCR (Flu A&B, Covid) Nasopharyngeal Swab     Status: None   Collection Time: 01/09/21  7:08 AM   Specimen: Nasopharyngeal Swab; Nasopharyngeal(NP) swabs in vial transport medium  Result Value Ref Range Status   SARS Coronavirus 2 by RT PCR NEGATIVE NEGATIVE Final    Comment: (NOTE) SARS-CoV-2 target nucleic acids are NOT DETECTED.  The SARS-CoV-2 RNA is generally detectable in upper respiratory specimens during the acute phase of infection. The lowest concentration of SARS-CoV-2 viral copies this assay can detect is 138 copies/mL. A negative result does not preclude SARS-Cov-2 infection and should not be used as the sole basis for treatment or other patient management decisions. A negative result may occur with  improper specimen collection/handling, submission of specimen other than nasopharyngeal swab, presence of viral mutation(s) within the areas targeted by this assay, and inadequate number of viral copies(<138 copies/mL). A negative result must be combined with clinical observations, patient history, and epidemiological information. The expected result is Negative.  Fact Sheet for Patients:  EntrepreneurPulse.com.au  Fact Sheet for Healthcare Providers:  IncredibleEmployment.be  This test is no t yet approved or cleared by the [place] FDA and  has been authorized for detection and/or diagnosis of SARS-CoV-2 by FDA under an Emergency Use Authorization (EUA). This EUA will remain  in effect (meaning this test can be used) for the duration of the COVID-19 declaration under Section 564(b)(1) of the  Act, 21 U.S.C.section 360bbb-3(b)(1), unless the authorization is terminated  or revoked sooner.       Influenza A by PCR NEGATIVE NEGATIVE Final   Influenza B by PCR NEGATIVE NEGATIVE Final    Comment: (NOTE) The Xpert Xpress SARS-CoV-2/FLU/RSV plus assay is intended as an aid in the diagnosis of influenza from Nasopharyngeal swab specimens and should not be used as a sole basis for treatment. Nasal washings and aspirates are unacceptable for Xpert Xpress SARS-CoV-2/FLU/RSV testing.  Fact Sheet  for Patients: EntrepreneurPulse.com.au  Fact Sheet for Healthcare Providers: IncredibleEmployment.be  This test is not yet approved or cleared by the [place] FDA and has been authorized for detection and/or diagnosis of SARS-CoV-2 by FDA under an Emergency Use Authorization (EUA). This EUA will remain in effect (meaning this test can be used) for the duration of the COVID-19 declaration under Section 564(b)(1) of the Act, 21 U.S.C. section 360bbb-3(b)(1), unless the authorization is terminated or revoked.  Performed at [facility], [place] [address] [place], [place] [address]      Labs: BNP (last 3 results) No results for input(s): BNP in the last 8760 hours. Basic Metabolic Panel: Recent Labs  Lab 01/07/21 1200 01/08/21 0444 01/08/21 2239 01/09/21 0343 01/10/21 0339 01/11/21 0322  NA 137 136  --  137 139 139  K 4.8 5.2* 4.3 4.1 4.3 4.0  CL 105 108  --  106 102 106  CO2 20* 18*  --  23 27 27   GLUCOSE 102* 126*  --  101* 81 85  BUN 37* 42*  --  39* 34* 25*  CREATININE 1.85* 2.30*  --  2.58* 2.06* 1.71*  CALCIUM 8.7* 8.5*  --  7.8* 8.5* 8.3*  PHOS  --   --   --  4.0 3.3  --    Liver Function Tests: Recent Labs  Lab 01/07/21 1200 01/09/21 0343 01/10/21 0339  AST 17  --   --   ALT 11  --   --   ALKPHOS 67  --   --   BILITOT 0.4  --   --   PROT 6.1*  --   --   ALBUMIN 3.2* 2.6* 2.9*   No results for input(s): LIPASE,  AMYLASE in the last 168 hours. No results for input(s): AMMONIA in the last 168 hours. CBC: Recent Labs  Lab 01/06/21 0943 01/07/21 1200 01/08/21 0645 01/09/21 0343 01/10/21 0339  WBC 6.8 6.9 12.0* 10.5 9.2  NEUTROABS 4,862  --   --   --   --   HGB 7.2* 7.0* 10.6* 8.8* 9.4*  HCT 23.8* 23.5* 33.1* 27.0* 29.5*  MCV 93.7 94.8 89.5 88.2 88.9  PLT 242 254 245 188 208   Cardiac Enzymes: No results for input(s): CKTOTAL, CKMB, CKMBINDEX, TROPONINI in the last 168 hours. BNP: Invalid input(s): POCBNP CBG: No results for input(s): GLUCAP in the last 168 hours. D-Dimer No results for input(s): DDIMER in the last 72 hours. Hgb A1c No results for input(s): HGBA1C in the last 72 hours. Lipid Profile No results for input(s): CHOL, HDL, LDLCALC, TRIG, CHOLHDL, LDLDIRECT in the last 72 hours. Thyroid function studies No results for input(s): TSH, T4TOTAL, T3FREE, THYROIDAB in the last 72 hours.  Invalid input(s): FREET3 Anemia work up No results for input(s): VITAMINB12, FOLATE, FERRITIN, TIBC, IRON, RETICCTPCT in the last 72 hours. Urinalysis    Component Value Date/Time   COLORURINE AMBER (A) 08/13/2015 1107   APPEARANCEUR CLEAR 08/13/2015 1107   LABSPEC 1.024 08/13/2015 1107   PHURINE 5.0 08/13/2015 1107   GLUCOSEU NEGATIVE 08/13/2015 1107   GLUCOSEU NEGATIVE 11/09/2012 1537   HGBUR NEGATIVE 08/13/2015 1107   BILIRUBINUR LARGE (A) 08/13/2015 1107   KETONESUR 15 (A) 08/13/2015 1107   PROTEINUR NEGATIVE 08/13/2015 1107   UROBILINOGEN 1.0 05/04/2015 1505   NITRITE POSITIVE (A) 08/13/2015 1107   LEUKOCYTESUR SMALL (A) 08/13/2015 1107   Sepsis Labs Invalid input(s): PROCALCITONIN,  WBC,  LACTICIDVEN Microbiology Recent Results (from the past 240 hour(s))  SARS  CORONAVIRUS 2 (TAT 6-24 HRS) Nasopharyngeal Nasopharyngeal Swab     Status: None   Collection Time: 01/09/21  3:54 AM   Specimen: Nasopharyngeal Swab  Result Value Ref Range Status   SARS Coronavirus 2 NEGATIVE  NEGATIVE Final    Comment: (NOTE) SARS-CoV-2 target nucleic acids are NOT DETECTED.  The SARS-CoV-2 RNA is generally detectable in upper and lower respiratory specimens during the acute phase of infection. Negative results do not preclude SARS-CoV-2 infection, do not rule out co-infections with other pathogens, and should not be used as the sole basis for treatment or other patient management decisions. Negative results must be combined with clinical observations, patient history, and epidemiological information. The expected result is Negative.  Fact Sheet for Patients: SugarRoll.be  Fact Sheet for Healthcare Providers: https://www.woods-mathews.com/  This test is not yet approved or cleared by the [place] FDA and  has been authorized for detection and/or diagnosis of SARS-CoV-2 by FDA under an Emergency Use Authorization (EUA). This EUA will remain  in effect (meaning this test can be used) for the duration of the COVID-19 declaration under Se ction 564(b)(1) of the Act, 21 U.S.C. section 360bbb-3(b)(1), unless the authorization is terminated or revoked sooner.  Performed at [facility], [place] [address] [place], [place] [address]   Resp Panel by RT-PCR (Flu A&B, Covid) Nasopharyngeal Swab     Status: None   Collection Time: 01/09/21  7:08 AM   Specimen: Nasopharyngeal Swab; Nasopharyngeal(NP) swabs in vial transport medium  Result Value Ref Range Status   SARS Coronavirus 2 by RT PCR NEGATIVE NEGATIVE Final    Comment: (NOTE) SARS-CoV-2 target nucleic acids are NOT DETECTED.  The SARS-CoV-2 RNA is generally detectable in upper respiratory specimens during the acute phase of infection. The lowest concentration of SARS-CoV-2 viral copies this assay can detect is 138 copies/mL. A negative result does not preclude SARS-Cov-2 infection and should not be used as the sole basis for treatment or other patient management  decisions. A negative result may occur with  improper specimen collection/handling, submission of specimen other than nasopharyngeal swab, presence of viral mutation(s) within the areas targeted by this assay, and inadequate number of viral copies(<138 copies/mL). A negative result must be combined with clinical observations, patient history, and epidemiological information. The expected result is Negative.  Fact Sheet for Patients:  EntrepreneurPulse.com.au  Fact Sheet for Healthcare Providers:  IncredibleEmployment.be  This test is no t yet approved or cleared by the [place] FDA and  has been authorized for detection and/or diagnosis of SARS-CoV-2 by FDA under an Emergency Use Authorization (EUA). This EUA will remain  in effect (meaning this test can be used) for the duration of the COVID-19 declaration under Section 564(b)(1) of the Act, 21 U.S.C.section 360bbb-3(b)(1), unless the authorization is terminated  or revoked sooner.       Influenza A by PCR NEGATIVE NEGATIVE Final   Influenza B by PCR NEGATIVE NEGATIVE Final    Comment: (NOTE) The Xpert Xpress SARS-CoV-2/FLU/RSV plus assay is intended as an aid in the diagnosis of influenza from Nasopharyngeal swab specimens and should not be used as a sole basis for treatment. Nasal washings and aspirates are unacceptable for Xpert Xpress SARS-CoV-2/FLU/RSV testing.  Fact Sheet for Patients: EntrepreneurPulse.com.au  Fact Sheet for Healthcare Providers: IncredibleEmployment.be  This test is not yet approved or cleared by the [place] FDA and has been authorized for detection and/or diagnosis of SARS-CoV-2 by FDA under an Emergency Use Authorization (EUA). This EUA will  remain in effect (meaning this test can be used) for the duration of the COVID-19 declaration under Section 564(b)(1) of the Act, 21 U.S.C. section 360bbb-3(b)(1), unless the  authorization is terminated or revoked.  Performed at [facility], [place] [address], [place], [place] [address]      Time coordinating discharge: 10mins  SIGNED:   Kathie Dike, MD  [facility] 01/11/2021, 10:33 AM   If 7PM-7AM, please contact night-coverage [email or web address]

## 2021-01-11 NOTE — Evaluation (Signed)
Occupational Therapy Evaluation Patient Details Name: Carol Page MRN: [id number] DOB: Feb 18, 1936 Today's Date: 01/11/2021    History of Present Illness 85 y.o. female presented to ED 01/07/21 on direction of her PCP after drop in her hemoglobin noted in blood work. In ED pt received iron infusion and 2 units of PRBC. Scheduled for EGD and colonoscopy.  PMH: Cameron ulcer, HTN, remote history of CVA on Plavix, HLD, hemorrhoids, chronic urinary incontinence, anxiety/depression, morbid obesity,   Clinical Impression   Patient admitted for the diagnosis above.  PTA she lives with her family, who can provide assist as needed.  She has all needed DME, and HH PT has been recommended.  If [facility] PT sees a [facility] OT need, they can refer as needed.  She is probably close to her baseline for ADL and toileting performance, but does admit to being a little unsteady on her feet.  She is scheduled for d/c this afternoon, OT will defer to the next level of care for post acute needs.      Follow Up Recommendations  No OT follow up    Equipment Recommendations  None recommended by OT    Recommendations for Other Services       Precautions / Restrictions Precautions Precautions: Fall Restrictions Weight Bearing Restrictions: No      Mobility Bed Mobility   Bed Mobility: Supine to Sit;Sit to Supine     Supine to sit: HOB elevated;Min assist Sit to supine: Min assist     Patient Response: Cooperative;Impulsive  Transfers                      Balance Overall balance assessment: Needs assistance Sitting-balance support: Feet supported;No upper extremity supported Sitting balance-Leahy Scale: Good     Standing balance support: Bilateral upper extremity supported Standing balance-Leahy Scale: Poor Standing balance comment: requires UE support for steadying               High Level Balance Comments: able to side step to [facility]           ADL either performed or assessed with  clinical judgement   ADL Overall ADL's : At baseline                                       General ADL Comments: needs assist with LB ADL and back.     Vision Baseline Vision/History: Wears glasses Patient Visual Report: No change from baseline       Perception     Praxis      Pertinent Vitals/Pain Pain Assessment: No/denies pain     Hand Dominance Right   Extremity/Trunk Assessment Upper Extremity Assessment Upper Extremity Assessment: Generalized weakness   Lower Extremity Assessment Lower Extremity Assessment: Defer to PT evaluation       Communication Communication Communication: No difficulties   Cognition Arousal/Alertness: Awake/alert Behavior During Therapy: WFL for tasks assessed/performed Overall Cognitive Status: Within Functional Limits for tasks assessed                                 General Comments: decreased ST memeory and a little impulsive.   General Comments                  Home Living Family/patient expects to be discharged to:: Private residence Living Arrangements: Children Available  Help at Discharge: Family;Available 24 hours/day Type of Home: House Home Access: Stairs to enter   Entrance Stairs-Rails: None Home Layout: One level     Bathroom Shower/Tub: Teacher, early years/pre: Standard Bathroom Accessibility: Yes How Accessible: Accessible via walker Home Equipment: Bedside commode;Tub bench;Walker - 4 wheels          Prior Functioning/Environment Level of Independence: Needs assistance  Gait / Transfers Assistance Needed: Per chart:  ambulates household distances with Rollator, mobilizes in community with w/c ADL's / Homemaking Assistance Needed: daughter assists with tub transfers and bathing, and provides IADL and community mobility.            OT Problem List: Decreased activity tolerance;Impaired balance (sitting and/or standing);Decreased safety awareness       OT Treatment/Interventions:      OT Goals(Current goals can be found in the care plan section) Acute Rehab OT Goals Patient Stated Goal: Hoping to return home this afternoon OT Goal Formulation: With patient Time For Goal Achievement: 01/11/21 Potential to Achieve Goals: Fair  OT Frequency:     Barriers to D/C:  None noted          Co-evaluation              AM-PAC OT "6 Clicks" Daily Activity     Outcome Measure Help from another person eating meals?: None Help from another person taking care of personal grooming?: None Help from another person toileting, which includes using toliet, bedpan, or urinal?: A Little Help from another person bathing (including washing, rinsing, drying)?: A Lot Help from another person to put on and taking off regular upper body clothing?: A Little Help from another person to put on and taking off regular lower body clothing?: A Lot 6 Click Score: 18   End of Session Equipment Utilized During Treatment: Rolling walker  Activity Tolerance: Patient tolerated treatment well Patient left: in bed;with call bell/phone within reach  OT Visit Diagnosis: Unsteadiness on feet (R26.81);History of falling (Z91.81);Muscle weakness (generalized) (M62.81)                Time: 7121-9758 OT Time Calculation (min): 15 min Charges:  OT General Charges $OT Visit: 1 Visit OT Evaluation $OT Eval Moderate Complexity: 1 Mod  01/11/2021  Rich, OTR/L  Acute Rehabilitation Services  Office:  [phone]   Metta Clines 01/11/2021, 11:04 AM

## 2021-01-11 NOTE — Progress Notes (Signed)
DISCHARGE NOTE HOME Carol Page to be discharged Home per MD order. Discussed prescriptions and follow up appointments with the patient. Prescriptions given to patient; medication list explained in detail. Patient verbalized understanding.  Skin clean, dry and intact without evidence of skin break down, no evidence of skin tears noted. IV catheter discontinued intact. Site without signs and symptoms of complications. Dressing and pressure applied. Pt denies pain at the site currently. No complaints noted.  Patient free of lines, drains, and wounds.   An After Visit Summary (AVS) was printed and given to the patient. Patient escorted via wheelchair, and discharged home via private auto.  Mikki Santee, RN

## 2021-01-11 NOTE — Plan of Care (Signed)
  Problem: Clinical Measurements: Goal: Ability to maintain clinical measurements within normal limits will improve Outcome: Progressing Goal: Diagnostic test results will improve Outcome: Progressing Goal: Respiratory complications will improve Outcome: Progressing   Problem: Activity: Goal: Risk for activity intolerance will decrease Outcome: Progressing   

## 2021-01-11 NOTE — TOC Transition Note (Signed)
Transition of Care [facility]) - CM/SW Discharge Note   Patient Details  Name: Martina Brodbeck MRN: [id number] Date of Birth: Nov 18, 1935  Transition of Care [facility]) CM/SW Contact:  Bartholomew Crews, RN Phone Number: [phone] 01/11/2021, 11:44 AM   Clinical Narrative:     Spoke with patient on her room phone [phone] to discuss plans to transition home. Patient agreeable to [facility] PT. Choice offered. Alvis Lemmings accepted referral. No further TOC needs identified.   Final next level of care: [place] Barriers to Discharge: No Barriers Identified   Patient Goals and CMS Choice Patient states their goals for this hospitalization and ongoing recovery are:: return home with daughter CMS Medicare.gov Compare Post Acute Care list provided to:: Patient Choice offered to / list presented to : Patient  Discharge Placement                       Discharge Plan and Services   Discharge Planning Services: CM Consult            DME Arranged: N/A DME Agency: NA       HH Arranged: PT HH Agency: [place] Date [place]: 01/11/21 Time [place]: 2244 Representative spoke with at [place]: [place] ([place]) Interventions     Readmission Risk Interventions No flowsheet data found.

## 2021-01-13 ENCOUNTER — Encounter (HOSPITAL_COMMUNITY): Payer: Self-pay | Admitting: Internal Medicine

## 2021-01-13 LAB — SURGICAL PATHOLOGY

## 2021-01-15 ENCOUNTER — Ambulatory Visit: Payer: Self-pay | Admitting: *Deleted

## 2021-01-15 ENCOUNTER — Emergency Department (HOSPITAL_COMMUNITY)
Admission: EM | Admit: 2021-01-15 | Discharge: 2021-01-15 | Disposition: A | Discharge status: Home / Self Care | Payer: Medicare Other | Attending: Emergency Medicine | Admitting: Emergency Medicine

## 2021-01-15 ENCOUNTER — Encounter (HOSPITAL_COMMUNITY): Payer: Self-pay | Admitting: Emergency Medicine

## 2021-01-15 DIAGNOSIS — N183 Chronic kidney disease, stage 3 unspecified: Secondary | ICD-10-CM | POA: Diagnosis not present

## 2021-01-15 DIAGNOSIS — Z79899 Other long term (current) drug therapy: Secondary | ICD-10-CM | POA: Insufficient documentation

## 2021-01-15 DIAGNOSIS — I129 Hypertensive chronic kidney disease with stage 1 through stage 4 chronic kidney disease, or unspecified chronic kidney disease: Secondary | ICD-10-CM | POA: Diagnosis not present

## 2021-01-15 DIAGNOSIS — Z8522 Personal history of malignant neoplasm of nasal cavities, middle ear, and accessory sinuses: Secondary | ICD-10-CM | POA: Insufficient documentation

## 2021-01-15 DIAGNOSIS — I10 Essential (primary) hypertension: Secondary | ICD-10-CM | POA: Diagnosis not present

## 2021-01-15 DIAGNOSIS — Z7902 Long term (current) use of antithrombotics/antiplatelets: Secondary | ICD-10-CM | POA: Insufficient documentation

## 2021-01-15 DIAGNOSIS — I959 Hypotension, unspecified: Secondary | ICD-10-CM | POA: Diagnosis not present

## 2021-01-15 DIAGNOSIS — R42 Dizziness and giddiness: Secondary | ICD-10-CM | POA: Diagnosis present

## 2021-01-15 LAB — CBC WITH DIFFERENTIAL/PLATELET
Abs Immature Granulocytes: 0.05 10*3/uL (ref 0.00–0.07)
Basophils Absolute: 0.1 10*3/uL (ref 0.0–0.1)
Basophils Relative: 1 %
Eosinophils Absolute: 0.4 10*3/uL (ref 0.0–0.5)
Eosinophils Relative: 4 %
HCT: 34.6 % — ABNORMAL LOW (ref 36.0–46.0)
Hemoglobin: 10.5 g/dL — ABNORMAL LOW (ref 12.0–15.0)
Immature Granulocytes: 1 %
Lymphocytes Relative: 17 %
Lymphs Abs: 1.7 10*3/uL (ref 0.7–4.0)
MCH: 28.5 pg (ref 26.0–34.0)
MCHC: 30.3 g/dL (ref 30.0–36.0)
MCV: 93.8 fL (ref 80.0–100.0)
Monocytes Absolute: 1.1 10*3/uL — ABNORMAL HIGH (ref 0.1–1.0)
Monocytes Relative: 11 %
Neutro Abs: 6.5 10*3/uL (ref 1.7–7.7)
Neutrophils Relative %: 66 %
Platelets: 202 10*3/uL (ref 150–400)
RBC: 3.69 MIL/uL — ABNORMAL LOW (ref 3.87–5.11)
RDW: 14.7 % (ref 11.5–15.5)
WBC: 9.8 10*3/uL (ref 4.0–10.5)
nRBC: 0 % (ref 0.0–0.2)

## 2021-01-15 LAB — COMPREHENSIVE METABOLIC PANEL
ALT: 11 U/L (ref 0–44)
AST: 15 U/L (ref 15–41)
Albumin: 3.2 g/dL — ABNORMAL LOW (ref 3.5–5.0)
Alkaline Phosphatase: 77 U/L (ref 38–126)
Anion gap: 8 (ref 5–15)
BUN: 19 mg/dL (ref 8–23)
CO2: 26 mmol/L (ref 22–32)
Calcium: 8.5 mg/dL — ABNORMAL LOW (ref 8.9–10.3)
Chloride: 107 mmol/L (ref 98–111)
Creatinine, Ser: 1.59 mg/dL — ABNORMAL HIGH (ref 0.44–1.00)
GFR, Estimated: 32 mL/min — ABNORMAL LOW (ref >60)
Glucose, Bld: 80 mg/dL (ref 70–99)
Potassium: 4.5 mmol/L (ref 3.5–5.1)
Sodium: 141 mmol/L (ref 135–145)
Total Bilirubin: 0.2 mg/dL — ABNORMAL LOW (ref 0.3–1.2)
Total Protein: 6.3 g/dL — ABNORMAL LOW (ref 6.5–8.1)

## 2021-01-15 NOTE — ED Notes (Signed)
Pt alert and oriented  Skin warm and dry pt keeps saying that she is ready to go home

## 2021-01-15 NOTE — Telephone Encounter (Signed)
Called received from Herbert Deaner, PT with [facility]. PT f/u with patient from recent discharge from hospital . Patient noted with elevated B/P at beginning of session. Patient up moving and B/P elevated higher, asymptomatic and EMS notified by PT. Appt scheduled for tomorrow. Have not cancelled at this time. See PT notes for B/P results and encounter with patient.

## 2021-01-15 NOTE — Telephone Encounter (Signed)
Answer Assessment - Initial Assessment Questions 1. REASON FOR CALL or QUESTION: "What is your reason for calling today?" or "How can I best help you?" or "What question do you have that I can help answer?"     Carol Page, PT notifying PCP patient has been transported to Hospital vis EMS due to elevated B/P  Protocols used: Information Only Call - No Triage-A-AH

## 2021-01-15 NOTE — ED Notes (Signed)
Daughter- Carol Page [phone] for updates

## 2021-01-15 NOTE — ED Triage Notes (Signed)
Pt here from home with c/o htn while walking around no cp or sob  b/p back down to 120/46 after resting

## 2021-01-15 NOTE — ED Provider Notes (Signed)
[facility] EMERGENCY DEPARTMENT Provider Note   CSN: [id number] Arrival date & time: 01/15/21  1423     History Chief Complaint  Patient presents with   Hypertension    Carol Page is a 85 y.o. female.  The history is provided by the patient.  Hypertension This is a recurrent problem. The problem occurs constantly. The problem has not changed since onset.Pertinent negatives include no chest pain, no abdominal pain, no headaches and no shortness of breath. Associated symptoms comments: Lightheaded episode while being evaluated by PT today since recent hospital discharge following GI bleed, restarted plavix two days ago . The symptoms are aggravated by walking. Nothing relieves the symptoms. She has tried nothing for the symptoms. The treatment provided no relief.      Past Medical History:  Diagnosis Date   Allergy    Arthritis    Cellulitis of left leg    Depression    History of blood transfusion    Hyperlipidemia    Hypertension    Pre-diabetes    Proctitis    Skin cancer of nose    Stroke [facility])    Ulcer    Urinary incontinence     Patient Active Problem List   Diagnosis Date Noted   DNR (do not resuscitate) 01/11/2021   GAVE (gastric antral vascular ectasia)    CKD (chronic kidney disease) stage 3, GFR 30-59 ml/min (HCC) 01/08/2021   Obesity, Class III, BMI 40-49.9 (morbid obesity) ([place]) 01/08/2021   GI bleed 01/07/2021   Absolute anemia    AKI (acute kidney injury) ([place]) 08/13/2015   Anxiety and depression 10/06/2013   Urinary incontinence 10/06/2013   Chronic leg pain 10/06/2013   History of CVA (cerebrovascular accident) 10/06/2013   Essential hypertension 11/09/2012   HLD (hyperlipidemia) 11/09/2012   Pre-diabetes 11/09/2012   Seizure disorder ([place]) 11/09/2012    Past Surgical History:  Procedure Laterality Date   BILATERAL OOPHORECTOMY  06/2012   ovarian cysts   BIOPSY  01/10/2021   Procedure: BIOPSY;  Surgeon: Gatha Mayer, MD;  Location: [facility];  Service: Endoscopy;;   COLONOSCOPY WITH PROPOFOL N/A 01/10/2021   Procedure: COLONOSCOPY WITH PROPOFOL;  Surgeon: Gatha Mayer, MD;  Location: [facility] ENDOSCOPY;  Service: Endoscopy;  Laterality: N/A;   ESOPHAGOGASTRODUODENOSCOPY (EGD) WITH PROPOFOL N/A 01/10/2021   Procedure: ESOPHAGOGASTRODUODENOSCOPY (EGD) WITH PROPOFOL;  Surgeon: Gatha Mayer, MD;  Location: [place];  Service: Endoscopy;  Laterality: N/A;   SKIN CANCER EXCISION  2007   reomved from [place]       OB History   No obstetric history on file.     Family History  Problem Relation Age of Onset   Heart disease Other        Parent   Cancer Maternal Uncle     Social History   Tobacco Use   Smoking status: Never   Smokeless tobacco: Never  Substance Use Topics   Alcohol use: No   Drug use: No    Home Medications Prior to Admission medications   Medication Sig Start Date End Date Taking? Authorizing Provider  atorvastatin (LIPITOR) 80 MG tablet Take 1 tablet by mouth once daily 09/24/20   Jearld Fenton, NP  bisacodyl (DULCOLAX) 5 MG EC tablet Take 2 tablets (10 mg total) by mouth daily as needed for moderate constipation. 04/24/18   Domenic Polite, MD  CALCIUM PO Take 1 tablet by mouth daily.  [provider]  cholecalciferol (VITAMIN D) 1000 units tablet Take 1,000 Units by mouth daily.    [provider]  clopidogrel (PLAVIX) 75 MG tablet Take 1 tablet (75 mg total) by mouth every evening. Resume on 6/27 01/11/21   Kathie Dike, MD  diltiazem [facility] CD) 180 MG 24 hr capsule Take 1 capsule by mouth once daily 10/11/20   Jearld Fenton, NP  escitalopram (LEXAPRO) 10 MG tablet Take 1 tablet by mouth once daily 12/17/20   Jearld Fenton, NP  gabapentin (NEURONTIN) 300 MG capsule Take 1 capsule by mouth twice daily 09/24/20   Jearld Fenton, NP  hydrochlorothiazide (MICROZIDE) 12.5 MG capsule Take 1 capsule by mouth once  daily 09/24/20   Jearld Fenton, NP  Javier Docker Oil 500 MG CAPS Take 500 mg by mouth in the morning and at bedtime.    [provider]  loratadine (CLARITIN) 10 MG tablet Take 10 mg by mouth daily.    [provider]  pantoprazole (PROTONIX) 40 MG tablet Take 1 tablet (40 mg total) by mouth daily. 01/11/21   Kathie Dike, MD  phenytoin (DILANTIN) 50 MG tablet Take 2 tablets by mouth once daily 09/09/20   Jearld Fenton, NP  polyethylene glycol (MIRALAX / GLYCOLAX) 17 g packet Take 17 g by mouth daily as needed for mild constipation. 01/11/21   Kathie Dike, MD  potassium chloride SA (KLOR-CON) 20 MEQ tablet TAKE 1  BY MOUTH ONCE DAILY 12/10/20   Jearld Fenton, NP  traZODone (DESYREL) 50 MG tablet TAKE 3 TABLETS BY MOUTH AT BEDTIME 10/23/20   Jearld Fenton, NP  vitamin B-12 (CYANOCOBALAMIN) 1000 MCG tablet Take 1,000 mcg by mouth daily.    [provider]    Allergies    Codeine and Ether  Review of Systems   Review of Systems  Constitutional:  Negative for chills, fatigue and fever.  HENT:  Negative for ear pain and sore throat.   Eyes:  Negative for pain and visual disturbance.  Respiratory:  Negative for cough and shortness of breath.   Cardiovascular:  Negative for chest pain and palpitations.  Gastrointestinal:  Negative for abdominal pain, diarrhea, nausea and vomiting.  Genitourinary:  Negative for dysuria and hematuria.  Musculoskeletal:  Negative for arthralgias and back pain.  Skin:  Negative for color change and rash.  Neurological:  Positive for light-headedness. Negative for dizziness, tremors, seizures, syncope, facial asymmetry, speech difficulty, weakness, numbness and headaches.  All other systems reviewed and are negative.  Physical Exam Updated Vital Signs ED Triage Vitals  Enc Vitals Group     BP 01/15/21 1446 (!) 146/52     Pulse Rate 01/15/21 1446 65     Resp 01/15/21 1446 18     Temp 01/15/21 1446 98.6 F (37 C)     Temp Source  01/15/21 1446 Oral     SpO2 01/15/21 1446 96 %     Weight --      Height --      Head Circumference --      Peak Flow --      Pain Score 01/15/21 1420 0     Pain Loc --      Pain Edu? --      Excl. in [place]? --      Physical Exam Vitals and nursing note reviewed.  Constitutional:      General: She is not in acute distress.    Appearance: She is well-developed. She is  not ill-appearing.  HENT:     Head: Normocephalic and atraumatic.     Nose: Nose normal.     Mouth/Throat:     Mouth: Mucous membranes are moist.  Eyes:     Extraocular Movements: Extraocular movements intact.     Conjunctiva/sclera: Conjunctivae normal.     Pupils: Pupils are equal, round, and reactive to light.  Cardiovascular:     Rate and Rhythm: Normal rate and regular rhythm.     Pulses: Normal pulses.     Heart sounds: Normal heart sounds. No murmur heard. Pulmonary:     Effort: Pulmonary effort is normal. No respiratory distress.     Breath sounds: Normal breath sounds.  Abdominal:     Palpations: Abdomen is soft.     Tenderness: There is no abdominal tenderness.  Musculoskeletal:     Cervical back: Normal range of motion and neck supple.  Skin:    General: Skin is warm and dry.     Capillary Refill: Capillary refill takes less than 2 seconds.  Neurological:     General: No focal deficit present.     Mental Status: She is alert and oriented to person, place, and time.     Cranial Nerves: No cranial nerve deficit.     Sensory: No sensory deficit.     Motor: No weakness.     Coordination: Coordination normal.    ED Results / Procedures / Treatments   Labs (all labs ordered are listed, but only abnormal results are displayed) Labs Reviewed  CBC WITH DIFFERENTIAL/PLATELET - Abnormal; Notable for the following components:      Result Value   RBC 3.69 (*)    Hemoglobin 10.5 (*)    HCT 34.6 (*)    Monocytes Absolute 1.1 (*)    All other components within normal limits  COMPREHENSIVE METABOLIC  PANEL - Abnormal; Notable for the following components:   Creatinine, Ser 1.59 (*)    Calcium 8.5 (*)    Total Protein 6.3 (*)    Albumin 3.2 (*)    Total Bilirubin 0.2 (*)    GFR, Estimated 32 (*)    All other components within normal limits    EKG EKG shows normal sinus rhythm.  No ischemic changes.  Radiology No results found.  Procedures Procedures   Medications Ordered in ED Medications - No data to display  ED Course  I have reviewed the triage vital signs and the nursing notes.  Pertinent labs & imaging results that were available during my care of the patient were reviewed by me and considered in my medical decision making (see chart for details).    MDM Rules/Calculators/A&P                          Carol Page is here for evaluation of hypertension.  Recently discharged from the hospital with GI bleed.  Had kidney injury and stopped her lisinopril in the interim.  Had evaluation by PT today at home.  She has not been very ambulatory since her hospital stay per her daughter.  She had a lot of difficulty walking in with her walker.  Physical therapist checked her blood pressure and it was elevated and they sent her for evaluation.  She is not having any chest pain or shortness of breath.  She was weak and lightheaded while she was walking.  She has not had any black stools.  Overall she is neurologically intact.  Blood pressure upon  arrival here is 146/52.  She is not having any chest pain or stroke symptoms.  Given her recent GI bleed we will recheck her CBC and BMP.  If kidney function is improved we will likely have her restart her lisinopril, she is supposed to see her primary care doctor tomorrow to discuss restarting that medication.  Lab work is overall unremarkable.  Hemoglobin stable.  Creatinine improved to 1.59.  Overall suspect she is having some physical deconditioning from recent hospital stay.  Home physical therapy is in place.  Blood pressure has been  overall unremarkable here.  She has follow-up with primary care doctor tomorrow.  Discharged in good condition.  This chart was dictated using voice recognition software.  Despite best efforts to proofread,  errors can occur which can change the documentation meaning.   Final Clinical Impression(s) / ED Diagnoses Final diagnoses:  Hypertension, unspecified type    Rx / DC Orders ED Discharge Orders     None        Lennice Sites, DO 01/15/21 1820

## 2021-01-15 NOTE — Discharge Instructions (Addendum)
Lab work today is unremarkable.  Hemoglobin is stable.  No concern for GI bleed.  Blood pressure has been unremarkable here.  Follow-up with your primary care doctor tomorrow.  Suspect that you will be able to restart your blood pressure medicine.  Kidney function is improving as creatinine is now 1.59.  Please continue to work with physical therapy.  Make sure you are using your walker at all time and have an extra assistance at home.

## 2021-01-16 ENCOUNTER — Encounter: Payer: Self-pay | Admitting: Internal Medicine

## 2021-01-16 ENCOUNTER — Ambulatory Visit (INDEPENDENT_AMBULATORY_CARE_PROVIDER_SITE_OTHER): Payer: Medicare Other | Admitting: Internal Medicine

## 2021-01-16 ENCOUNTER — Other Ambulatory Visit: Payer: Self-pay

## 2021-01-16 VITALS — BP 139/96 | HR 73 | Ht 62.0 in

## 2021-01-16 DIAGNOSIS — I700 Atherosclerosis of aorta: Secondary | ICD-10-CM | POA: Diagnosis not present

## 2021-01-16 DIAGNOSIS — K31811 Angiodysplasia of stomach and duodenum with bleeding: Secondary | ICD-10-CM | POA: Diagnosis not present

## 2021-01-16 DIAGNOSIS — K922 Gastrointestinal hemorrhage, unspecified: Secondary | ICD-10-CM

## 2021-01-16 DIAGNOSIS — D649 Anemia, unspecified: Secondary | ICD-10-CM | POA: Diagnosis not present

## 2021-01-16 DIAGNOSIS — I10 Essential (primary) hypertension: Secondary | ICD-10-CM | POA: Diagnosis not present

## 2021-01-16 DIAGNOSIS — N179 Acute kidney failure, unspecified: Secondary | ICD-10-CM | POA: Diagnosis not present

## 2021-01-16 NOTE — Progress Notes (Signed)
She has not  Subjective:    Patient ID: Carol Page, female    DOB: July 31, 1935, 85 y.o.   MRN: [id number]  HPI  Pt presents to the clinic today for hospital follow up. She was advised to go to the ER by me on 01/07/21 for symptomatic anemia. Her intial H/H was 7/23.5. She was transfused PRBC. GI was consulted. She had an upper GI which showed gastric antral vascular ectasia which was treated with radiofrequency ablation. She was continued on her PPI. She developed AKI. She was treated with IV fluids. Renal ultrasound showed mild hydronephrosis. CT renal stone did not show any obstructing lesion. Her Lisinopril was d/c'd but she was restarted on her HCTZ prior to discharge. She was discharged 01/11/21, advised to follow up with her PCP in 1 week. She had been working with home health PT, who noticed her BP was elevated. EMS was called. She was evaluated in the ER yesterday. Blood pressure was < 150/90. They did a CBC and BMET which showed H/H of 10.6/32.5, creatinine 1.56 and GFR of 32. Since discharge, she reports she has been doing much better.  She is less fatigued.  Noticed any more blood in her stool.  She has been working with physical therapy.  Review of Systems     Past Medical History:  Diagnosis Date   Allergy    Arthritis    Cellulitis of left leg    Depression    History of blood transfusion    Hyperlipidemia    Hypertension    Pre-diabetes    Proctitis    Skin cancer of nose    Stroke (HCC)    Ulcer    Urinary incontinence     Current Outpatient Medications  Medication Sig Dispense Refill   atorvastatin (LIPITOR) 80 MG tablet Take 1 tablet by mouth once daily 90 tablet 3   bisacodyl (DULCOLAX) 5 MG EC tablet Take 2 tablets (10 mg total) by mouth daily as needed for moderate constipation. 30 tablet 0   CALCIUM PO Take 1 tablet by mouth daily.     cholecalciferol (VITAMIN D) 1000 units tablet Take 1,000 Units by mouth daily.     clopidogrel (PLAVIX) 75 MG tablet Take 1  tablet (75 mg total) by mouth every evening. Resume on 6/27 90 tablet 0   diltiazem (CARDIZEM CD) 180 MG 24 hr capsule Take 1 capsule by mouth once daily 90 capsule 3   escitalopram (LEXAPRO) 10 MG tablet Take 1 tablet by mouth once daily 90 tablet 0   gabapentin (NEURONTIN) 300 MG capsule Take 1 capsule by mouth twice daily 180 capsule 3   hydrochlorothiazide (MICROZIDE) 12.5 MG capsule Take 1 capsule by mouth once daily 90 capsule 3   Krill Oil 500 MG CAPS Take 500 mg by mouth in the morning and at bedtime.     loratadine (CLARITIN) 10 MG tablet Take 10 mg by mouth daily.     pantoprazole (PROTONIX) 40 MG tablet Take 1 tablet (40 mg total) by mouth daily. 30 tablet 0   phenytoin (DILANTIN) 50 MG tablet Take 2 tablets by mouth once daily 180 tablet 1   polyethylene glycol (MIRALAX / GLYCOLAX) 17 g packet Take 17 g by mouth daily as needed for mild constipation. 14 each 0   potassium chloride SA (KLOR-CON) 20 MEQ tablet TAKE 1  BY MOUTH ONCE DAILY 90 tablet 0   traZODone (DESYREL) 50 MG tablet TAKE 3 TABLETS BY MOUTH AT BEDTIME 270 tablet 2  vitamin B-12 (CYANOCOBALAMIN) 1000 MCG tablet Take 1,000 mcg by mouth daily.     No current facility-administered medications for this visit.    Allergies  Allergen Reactions   Codeine Nausea And Vomiting   Ether Nausea And Vomiting    Family History  Problem Relation Age of Onset   Heart disease Other        Parent   Cancer Maternal Uncle     Social History   Socioeconomic History   Marital status: Widowed    Spouse name: Not on file   Number of children: 4   Years of education: 12   Highest education level: Not on file  Occupational History   Occupation: Retired  Tobacco Use   Smoking status: Never   Smokeless tobacco: Never  Substance and Sexual Activity   Alcohol use: No   Drug use: No   Sexual activity: Never  Other Topics Concern   Not on file  Social History Narrative   Regular exercise-no   Caffeine Use-yes   Social  Determinants of Health   Financial Resource Strain: Not on file  Food Insecurity: Not on file  Transportation Needs: Not on file  Physical Activity: Not on file  Stress: Not on file  Social Connections: Not on file  Intimate Partner Violence: Not on file     Constitutional: Denies fever, malaise, fatigue, headache or abrupt weight changes.  HEENT: Denies eye pain, eye redness, ear pain, ringing in the ears, wax buildup, runny nose, nasal congestion, bloody nose, or sore throat. Respiratory: Denies difficulty breathing, shortness of breath, cough or sputum production.   Cardiovascular: Denies chest pain, chest tightness, palpitations or swelling in the hands or feet.  Gastrointestinal: Denies abdominal pain, bloating, constipation, diarrhea or blood in the stool.  GU: Denies urgency, frequency, pain with urination, burning sensation, blood in urine, odor or discharge. Musculoskeletal: Pt reports difficulty with gait. Denies decrease in range of motion, muscle pain or joint pain and swelling.  Skin: Denies redness, rashes, lesions or ulcercations.  Neurological: Pt reports difficulty with balance. Denies dizziness, difficulty with memory, difficulty with speech or problems with coordination.  Psych: Pt has a history of anxiety and depression. Denies SI/HI.  No other specific complaints in a complete review of systems (except as listed in HPI above).  Objective:   Physical Exam BP (!) 139/96   Pulse 73   Ht 5\' 2"  (1.575 m)   SpO2 93%   BMI 43.71 kg/m   Wt Readings from Last 3 Encounters:  01/07/21 238 lb 15.7 oz (108.4 kg)  08/28/20 239 lb (108.4 kg)  03/24/19 246 lb (111.6 kg)    General: Appears tired stated age, obese, chronically ill-appearing, in NAD. Skin: Warm, dry and intact.  HEENT: Head: normal shape and size; Eyes: sclera white and EOMs intact;  Cardiovascular: Normal rate and rhythm. S1,S2 noted.  Murmur noted.  1+ nonpitting BLE edema.  Pulmonary/Chest: Normal  effort and positive vesicular breath sounds. No respiratory distress. No wheezes, rales or ronchi noted.  Abdomen: Soft and nontender. Normal bowel sounds. No distention or masses noted.  Musculoskeletal: In wheelchair. Neurological: Alert and oriented.  Psychiatric: Mood and affect flat.  She does exhibit some self-loathing attitudes.  Judgment and thought content normal.    BMET    Component Value Date/Time   NA 141 01/15/2021 1511   K 4.5 01/15/2021 1511   CL 107 01/15/2021 1511   CO2 26 01/15/2021 1511   GLUCOSE 80 01/15/2021 1511  BUN 19 01/15/2021 1511   CREATININE 1.59 (H) 01/15/2021 1511   CREATININE 1.39 (H) 03/24/2019 1557   CALCIUM 8.5 (L) 01/15/2021 1511   GFRNONAA 32 (L) 01/15/2021 1511   GFRAA 56 (L) 04/26/2018 0417    Lipid Panel     Component Value Date/Time   CHOL 121 08/28/2020 1419   TRIG 89.0 08/28/2020 1419   HDL 43.10 08/28/2020 1419   CHOLHDL 3 08/28/2020 1419   VLDL 17.8 08/28/2020 1419   LDLCALC 60 08/28/2020 1419   LDLCALC 75 03/24/2019 1557    CBC    Component Value Date/Time   WBC 9.8 01/15/2021 1511   RBC 3.69 (L) 01/15/2021 1511   HGB 10.5 (L) 01/15/2021 1511   HCT 34.6 (L) 01/15/2021 1511   PLT 202 01/15/2021 1511   MCV 93.8 01/15/2021 1511   MCH 28.5 01/15/2021 1511   MCHC 30.3 01/15/2021 1511   RDW 14.7 01/15/2021 1511   LYMPHSABS 1.7 01/15/2021 1511   MONOABS 1.1 (H) 01/15/2021 1511   EOSABS 0.4 01/15/2021 1511   BASOSABS 0.1 01/15/2021 1511    Hgb A1C Lab Results  Component Value Date   HGBA1C 5.3 08/28/2020          Assessment & Plan:   Hospital Follow up for Symptomatic Anemia, GI Bleed, GAVE, AKI and HTN:  Hospital notes, labs and imaging reviewed We will have her restart Lisinopril CBC and BMET from yesterday reviewed, no need to repeat today Encouraged adequate water intake Continue to work with home health/PT  RTC in 1 month for follow up and repeat labs Webb Silversmith, NP This visit occurred during the  SARS-CoV-2 public health emergency.  Safety protocols were in place, including screening questions prior to the visit, additional usage of staff PPE, and extensive cleaning of exam room while observing appropriate contact time as indicated for disinfecting solutions.

## 2021-01-16 NOTE — Telephone Encounter (Signed)
Noted, will discuss at upcoming follow up

## 2021-01-17 DIAGNOSIS — I700 Atherosclerosis of aorta: Secondary | ICD-10-CM | POA: Insufficient documentation

## 2021-01-17 NOTE — Patient Instructions (Signed)
Gastrointestinal Bleeding Gastrointestinal (GI) bleeding is bleeding somewhere along the path that food travels through the body (digestive tract). This path is anywhere between the mouth and the opening of the butt (anus). You may have blood in your poop (stool) or have black poop. If you throw up (vomit), there may be blood in it. This condition can be mild, serious, or even life-threatening. If you have alot of bleeding, you may need to stay in the hospital. What are the causes? This condition may be caused by: Irritation and swelling of the esophagus (esophagitis). The esophagus is part of the body that moves food from your mouth to your stomach. Swollen veins in the butt (hemorrhoids). Areas of painful tearing in the opening of the butt (anal fissures). These are often caused by passing hard poop. Pouches that form on the colon over time (diverticulosis). Irritation and swelling (diverticulitis) in areas where pouches have formed on the colon. Growths (polyps) or cancer. Colon cancer often starts out as growths that are not cancer. Irritation of the stomach lining (gastritis). Sores (ulcers) in the stomach. What increases the risk? You are more likely to develop this condition if you: Have a certain type of infection in your stomach (Helicobacter pylori infection). Take certain medicines. Smoke. Drink alcohol. What are the signs or symptoms? Common symptoms of this condition include: Throwing up (vomiting) material that has bright red blood in it. It may look like coffee grounds. Changes in your poop. The poop may: Have red blood in it. Be black, look like tar, and smell stronger than normal. Be red. Pain or cramping in the belly (abdomen). How is this treated? Treatment for this condition depends on the cause of the bleeding. For example: Sometimes, the bleeding can be stopped during a procedure that is done to find the problem (endoscopy or colonoscopy). Medicines can be used  to: Help control irritation, swelling, or infection. Reduce acid in your stomach. Certain problems can be treated with: Creams. Medicines that are put in the butt (suppositories). Warm baths. Surgery is sometimes needed. If you lose a lot of blood, you may need a blood transfusion. If bleeding is mild, you may be allowed to go home. If there is a lot ofbleeding, you will need to stay in the hospital. Follow these instructions at home:  Take over-the-counter and prescription medicines only as told by your doctor. Eat foods that have a lot of fiber in them. These foods include beans, whole grains, and fresh fruits and vegetables. You can also try eating 1-3 prunes each day. Drink enough fluid to keep your pee (urine) pale yellow. Keep all follow-up visits as told by your doctor. This is important. Contact a doctor if: Your symptoms do not get better. Get help right away if: Your bleeding does not stop. You feel dizzy or you pass out (faint). You feel weak. You have very bad cramps in your back or belly. You pass large clumps of blood (clots) in your poop. Your symptoms are getting worse. You have chest pain or fast heartbeats. Summary GI bleeding is bleeding somewhere along the path that food travels through the body (digestive tract). This bleeding can be caused by many things. Treatment depends on the cause of the bleeding. Take medicines only as told by your doctor. Keep all follow-up visits as told by your doctor. This is important. This information is not intended to replace advice given to you by your health care provider. Make sure you discuss any questions you have with  your healthcare provider. Document Revised: 02/16/2018 Document Reviewed: 02/16/2018 Elsevier Patient Education  [place].

## 2021-01-22 ENCOUNTER — Encounter: Payer: Self-pay | Admitting: Internal Medicine

## 2021-02-10 ENCOUNTER — Other Ambulatory Visit: Payer: Self-pay

## 2021-02-10 MED ORDER — PANTOPRAZOLE SODIUM 40 MG PO TBEC: 40.0000 mg | DELAYED_RELEASE_TABLET | Freq: Every day | ORAL | 2 refills | Status: DC

## 2021-02-17 ENCOUNTER — Other Ambulatory Visit: Payer: Self-pay

## 2021-02-17 ENCOUNTER — Ambulatory Visit (INDEPENDENT_AMBULATORY_CARE_PROVIDER_SITE_OTHER): Payer: Medicare Other | Admitting: Internal Medicine

## 2021-02-17 ENCOUNTER — Telehealth: Payer: Self-pay

## 2021-02-17 ENCOUNTER — Encounter: Payer: Self-pay | Admitting: Internal Medicine

## 2021-02-17 VITALS — BP 104/34 | HR 68 | Temp 98.6°F | Resp 18 | Ht 62.0 in | Wt 241.0 lb

## 2021-02-17 DIAGNOSIS — D500 Iron deficiency anemia secondary to blood loss (chronic): Secondary | ICD-10-CM

## 2021-02-17 DIAGNOSIS — N1832 Chronic kidney disease, stage 3b: Secondary | ICD-10-CM | POA: Diagnosis not present

## 2021-02-17 DIAGNOSIS — I10 Essential (primary) hypertension: Secondary | ICD-10-CM | POA: Diagnosis not present

## 2021-02-17 DIAGNOSIS — K31819 Angiodysplasia of stomach and duodenum without bleeding: Secondary | ICD-10-CM

## 2021-02-17 LAB — CBC WITH DIFFERENTIAL/PLATELET
Absolute Monocytes: 894 cells/uL (ref 200–950)
Basophils Absolute: 26 cells/uL (ref 0–200)
Basophils Relative: 0.3 %
Eosinophils Absolute: 112 cells/uL (ref 15–500)
Eosinophils Relative: 1.3 %
HCT: 27.5 % — ABNORMAL LOW (ref 35.0–45.0)
Hemoglobin: 8.7 g/dL — ABNORMAL LOW (ref 11.7–15.5)
Lymphs Abs: 826 cells/uL — ABNORMAL LOW (ref 850–3900)
MCH: 29.5 pg (ref 27.0–33.0)
MCHC: 31.6 g/dL — ABNORMAL LOW (ref 32.0–36.0)
MCV: 93.2 fL (ref 80.0–100.0)
MPV: 11.2 fL (ref 7.5–12.5)
Monocytes Relative: 10.4 %
Neutro Abs: 6742 cells/uL (ref 1500–7800)
Neutrophils Relative %: 78.4 %
Platelets: 207 10*3/uL (ref 140–400)
RBC: 2.95 10*6/uL — ABNORMAL LOW (ref 3.80–5.10)
RDW: 16.3 % — ABNORMAL HIGH (ref 11.0–15.0)
Total Lymphocyte: 9.6 %
WBC: 8.6 10*3/uL (ref 3.8–10.8)

## 2021-02-17 LAB — BASIC METABOLIC PANEL WITH GFR
BUN/Creatinine Ratio: 20 (calc) (ref 6–22)
BUN: 37 mg/dL — ABNORMAL HIGH (ref 7–25)
CO2: 24 mmol/L (ref 20–32)
Calcium: 8.8 mg/dL (ref 8.6–10.4)
Chloride: 105 mmol/L (ref 98–110)
Creat: 1.81 mg/dL — ABNORMAL HIGH (ref 0.60–0.95)
Glucose, Bld: 97 mg/dL (ref 65–139)
Potassium: 5.1 mmol/L (ref 3.5–5.3)
Sodium: 141 mmol/L (ref 135–146)
eGFR: 27 mL/min/{1.73_m2} — ABNORMAL LOW (ref >=60)

## 2021-02-17 NOTE — Progress Notes (Signed)
Subjective:    Patient ID: Carol Page, female    DOB: 06-26-1936, 85 y.o.   MRN: [id number]  HPI  Pt presents to the clinic today for follow up of HTN, CKD and Anemia.  HTN: Her BP today is 104/34. She restarted her Lisinopril as prescribed. She is taking Potassium and HCTZ as well. ECG from 12/2020 reviewed.  IDA: Her last H/H was 10.5/34.6, 12/2020. She is not currently taking oral iron. She is on Plavix. She is not following with hematology.  CKD: Her last creatinine was 1.59, GFR 32, 12/2020. She is on Lisinopril for renal protection. She does not follow with nephrology.  Review of Systems  Past Medical History:  Diagnosis Date   Allergy    Arthritis    Cellulitis of left leg    Depression    History of blood transfusion    Hyperlipidemia    Hypertension    Pre-diabetes    Proctitis    Skin cancer of nose    Stroke (HCC)    Ulcer    Urinary incontinence     Current Outpatient Medications  Medication Sig Dispense Refill   atorvastatin (LIPITOR) 80 MG tablet Take 1 tablet by mouth once daily 90 tablet 3   bisacodyl (DULCOLAX) 5 MG EC tablet Take 2 tablets (10 mg total) by mouth daily as needed for moderate constipation. 30 tablet 0   CALCIUM PO Take 1 tablet by mouth daily.     cholecalciferol (VITAMIN D) 1000 units tablet Take 1,000 Units by mouth daily.     clopidogrel (PLAVIX) 75 MG tablet Take 1 tablet (75 mg total) by mouth every evening. Resume on 6/27 90 tablet 0   diltiazem (CARDIZEM CD) 180 MG 24 hr capsule Take 1 capsule by mouth once daily 90 capsule 3   escitalopram (LEXAPRO) 10 MG tablet Take 1 tablet by mouth once daily 90 tablet 0   gabapentin (NEURONTIN) 300 MG capsule Take 1 capsule by mouth twice daily 180 capsule 3   hydrochlorothiazide (MICROZIDE) 12.5 MG capsule Take 1 capsule by mouth once daily 90 capsule 3   Krill Oil 500 MG CAPS Take 500 mg by mouth in the morning and at bedtime.     loratadine (CLARITIN) 10 MG tablet Take 10 mg by mouth  daily.     pantoprazole (PROTONIX) 40 MG tablet Take 1 tablet (40 mg total) by mouth daily. 30 tablet 2   polyethylene glycol (MIRALAX / GLYCOLAX) 17 g packet Take 17 g by mouth daily as needed for mild constipation. 14 each 0   potassium chloride SA (KLOR-CON) 20 MEQ tablet TAKE 1  BY MOUTH ONCE DAILY 90 tablet 0   traZODone (DESYREL) 50 MG tablet TAKE 3 TABLETS BY MOUTH AT BEDTIME 270 tablet 2   vitamin B-12 (CYANOCOBALAMIN) 1000 MCG tablet Take 1,000 mcg by mouth daily.     No current facility-administered medications for this visit.    Allergies  Allergen Reactions   Codeine Nausea And Vomiting   Ether Nausea And Vomiting    Family History  Problem Relation Age of Onset   Heart disease Other        Parent   Cancer Maternal Uncle     Social History   Socioeconomic History   Marital status: Widowed    Spouse name: Not on file   Number of children: 4   Years of education: 63   Highest education level: Not on file  Occupational History   Occupation: Retired  Tobacco  Use   Smoking status: Never   Smokeless tobacco: Never  Substance and Sexual Activity   Alcohol use: No   Drug use: No   Sexual activity: Never  Other Topics Concern   Not on file  Social History Narrative   Regular exercise-no   Caffeine Use-yes   Social Determinants of Health   Financial Resource Strain: Not on file  Food Insecurity: Not on file  Transportation Needs: Not on file  Physical Activity: Not on file  Stress: Not on file  Social Connections: Not on file  Intimate Partner Violence: Not on file     Constitutional: Denies fever, malaise, fatigue, headache or abrupt weight changes.  Respiratory: Denies difficulty breathing, shortness of breath, cough or sputum production.   Cardiovascular: Denies chest pain, chest tightness, palpitations or swelling in the hands or feet.  Gastrointestinal: Denies abdominal pain, bloating, constipation, diarrhea or blood in the stool.  GU: Denies  urgency, frequency, pain with urination, burning sensation, blood in urine, odor or discharge. Skin: Denies redness, rashes, lesions or ulcercations.  Neurological: Denies dizziness, difficulty with memory, difficulty with speech or problems with balance and coordination.    No other specific complaints in a complete review of systems (except as listed in HPI above).      Objective:   Physical Exam  BP (!) 104/34 (BP Location: Left Arm, Patient Position: Sitting, Cuff Size: Large)   Pulse 68   Temp 98.6 F (37 C) (Temporal)   Resp 18   Ht 5\' 2"  (1.575 m)   Wt 241 lb (109.3 kg)   SpO2 98%   BMI 44.08 kg/m   Wt Readings from Last 3 Encounters:  01/07/21 238 lb 15.7 oz (108.4 kg)  08/28/20 239 lb (108.4 kg)  03/24/19 246 lb (111.6 kg)    General: Appears her stated age, chronically ill appearing in NAD. Skin: Warm, dry and intact.  HEENT: Head: normal shape and size; Eyes: sclera white and EOMs intact;  Cardiovascular: Normal rate and rhythm. S1,S2 noted.  No murmur, rubs or gallops noted. Trace bilateral BLE edema noted. No carotid bruits noted. Pulmonary/Chest: Normal effort and positive vesicular breath sounds. No respiratory distress. No wheezes, rales or ronchi noted.  Abdomen: Soft and nontender.  Musculoskeletal: In wheelchair. Neurological: Alert and oriented.  Psychiatric: Mood and affect flat. Behavior is normal. Judgment and thought content normal.    BMET    Component Value Date/Time   NA 141 01/15/2021 1511   K 4.5 01/15/2021 1511   CL 107 01/15/2021 1511   CO2 26 01/15/2021 1511   GLUCOSE 80 01/15/2021 1511   BUN 19 01/15/2021 1511   CREATININE 1.59 (H) 01/15/2021 1511   CREATININE 1.39 (H) 03/24/2019 1557   CALCIUM 8.5 (L) 01/15/2021 1511   GFRNONAA 32 (L) 01/15/2021 1511   GFRAA 56 (L) 04/26/2018 0417    Lipid Panel     Component Value Date/Time   CHOL 121 08/28/2020 1419   TRIG 89.0 08/28/2020 1419   HDL 43.10 08/28/2020 1419   CHOLHDL 3  08/28/2020 1419   VLDL 17.8 08/28/2020 1419   LDLCALC 60 08/28/2020 1419   LDLCALC 75 03/24/2019 1557    CBC    Component Value Date/Time   WBC 9.8 01/15/2021 1511   RBC 3.69 (L) 01/15/2021 1511   HGB 10.5 (L) 01/15/2021 1511   HCT 34.6 (L) 01/15/2021 1511   PLT 202 01/15/2021 1511   MCV 93.8 01/15/2021 1511   MCH 28.5 01/15/2021 1511   MCHC  30.3 01/15/2021 1511   RDW 14.7 01/15/2021 1511   LYMPHSABS 1.7 01/15/2021 1511   MONOABS 1.1 (H) 01/15/2021 1511   EOSABS 0.4 01/15/2021 1511   BASOSABS 0.1 01/15/2021 1511    Hgb A1C Lab Results  Component Value Date   HGBA1C 5.3 08/28/2020           Assessment & Plan:   Webb Silversmith, NP This visit occurred during the SARS-CoV-2 public health emergency.  Safety protocols were in place, including screening questions prior to the visit, additional usage of staff PPE, and extensive cleaning of exam room while observing appropriate contact time as indicated for disinfecting solutions.

## 2021-02-17 NOTE — Telephone Encounter (Signed)
Patient is asking for Lisinopril 5mg  to called into pharm.  He reports pill is very small to cut.

## 2021-02-18 ENCOUNTER — Encounter: Payer: Self-pay | Admitting: Internal Medicine

## 2021-02-18 DIAGNOSIS — D509 Iron deficiency anemia, unspecified: Secondary | ICD-10-CM | POA: Insufficient documentation

## 2021-02-18 MED ORDER — LISINOPRIL 5 MG PO TABS: 5.0000 mg | ORAL_TABLET | Freq: Every day | ORAL | 1 refills | Status: DC

## 2021-02-18 NOTE — Telephone Encounter (Signed)
done

## 2021-02-18 NOTE — Addendum Note (Signed)
Addended by: Jearld Fenton on: 02/18/2021 07:38 AM   Modules accepted: Orders

## 2021-02-18 NOTE — Assessment & Plan Note (Signed)
BMET today On Lisinopril for renal protection Referral to nephrology for further evaluation

## 2021-02-18 NOTE — Assessment & Plan Note (Signed)
BP low Decrease Lisinopril to 5 mg daily Continue potassium and HCTZ BMET today

## 2021-02-18 NOTE — Assessment & Plan Note (Signed)
Urgent referral to hematology placed

## 2021-02-18 NOTE — Progress Notes (Signed)
Referral to hematology, gastroenterology and nephrology placed

## 2021-02-18 NOTE — Patient Instructions (Signed)
Chronic Kidney Disease, Adult Chronic kidney disease is when lasting damage happens to the kidneys slowly over a long time. The kidneys help to: Make pee (urine). Make hormones. Keep the right amount of fluids and chemicals in the body. Most often, this disease does not go away. You must take steps to help keep the kidney damage from getting worse. If steps are not taken, the kidneys might stop working forever. What are the causes? Diabetes. High blood pressure. Diseases that affect the heart and blood vessels. Other kidney diseases. Diseases of the body's disease-fighting system. A problem with the flow of pee. Infections of the organs that make pee, store it, and take it out of the body. Swelling or irritation of your blood vessels. What increases the risk? Getting older. Having someone in your family who has kidney disease or kidney failure. Having a disease caused by genes. Taking medicines often that harm the kidneys. Being near or having contact with harmful substances. Being very overweight. Using tobacco now or in the past. What are the signs or symptoms? Feeling very tired. Having a swollen face, legs, ankles, or feet. Feeling like you may vomit or vomiting. Not feeling hungry. Being confused or not able to focus. Twitches and cramps in the leg muscles or other muscles. Dry, itchy skin. A taste of metal in your mouth. Making less pee, or making more pee. Shortness of breath. Trouble sleeping. You may also become anemic or get weak bones. Anemic means there is not enough red blood cells or hemoglobin in your blood. You may get symptoms slowly. You may not notice them until the kidney damage gets very bad. How is this treated? Often, there is no cure for this disease. Treatment can help with symptoms and help keep the disease from getting worse. You may need to: Avoid alcohol. Avoid foods that are high in salt, potassium, phosphorous, and protein. Take medicines for  symptoms and to help control other conditions. Have dialysis. This treatment gets harmful waste out of your body. Treat other problems that cause your kidney disease or make it worse. Follow these instructions at home: Medicines Take over-the-counter and prescription medicines only as told by your doctor. Do not take any new medicines, vitamins, or supplements unless your doctor says it is okay. Lifestyle  Do not smoke or use any products that contain nicotine or tobacco. If you need help quitting, ask your doctor. If you drink alcohol: Limit how much you use to: 0-1 drink a day for women who are not pregnant. 0-2 drinks a day for men. Know how much alcohol is in your drink. In the [place] one drink equals one 12 oz bottle of beer (355 mL), one 5 oz glass of wine (148 mL), or one 1 oz glass of hard liquor (44 mL). Stay at a healthy weight. If you need help losing weight, ask your doctor. General instructions  Follow instructions from your doctor about what you cannot eat or drink. Track your blood pressure at home. Tell your doctor about any changes. If you have diabetes, track your blood sugar. Exercise at least 30 minutes a day, 5 days a week. Keep your shots (vaccinations) up to date. Keep all follow-up visits. Where to find more information American Association of Kidney Patients: www.aakp.org National Kidney Foundation: www.kidney.org American Kidney Fund: www.akfinc.org Life Options: www.lifeoptions.org Kidney School: www.kidneyschool.org Contact a doctor if: Your symptoms get worse. You get new symptoms. Get help right away if: You get symptoms of end-stage kidney disease. These   include: Headaches. Losing feeling in your hands or feet. Easy bruising. Having hiccups often. Chest pain. Shortness of breath. Lack of menstrual periods, in women. You have a fever. You make less pee than normal. You have pain or you bleed when you pee or poop. These symptoms may be an  emergency. Get help right away. Call your local emergency services (911 in the [place] Do not wait to see if the symptoms will go away. Do not drive yourself to the hospital. Summary Chronic kidney disease is when lasting damage happens to the kidneys slowly over a long time. Causes of this disease include diabetes and high blood pressure. Often, there is no cure for this disease. Treatment can help symptoms and help keep the disease from getting worse. Treatment may involve lifestyle changes, medicines, and dialysis. This information is not intended to replace advice given to you by your health care provider. Make sure you discuss any questions you have with your health care provider. Document Revised: 10/11/2019 Document Reviewed: 10/11/2019 Elsevier Patient Education  2022 [employer]  

## 2021-02-19 ENCOUNTER — Telehealth: Payer: Self-pay | Admitting: Hematology and Oncology

## 2021-02-19 NOTE — Telephone Encounter (Signed)
Received a new hem referral from Webb Silversmith, NP for Carol Page. Carol Page has been scheduled to see Dr. Alvy Bimler on 8/12 at 1pm. Appt date and time has been given to the pt's daughter. Aware to arrive 30 minutes early.

## 2021-02-20 ENCOUNTER — Ambulatory Visit: Payer: Medicare Other | Admitting: Internal Medicine

## 2021-02-28 ENCOUNTER — Other Ambulatory Visit: Payer: Self-pay

## 2021-02-28 ENCOUNTER — Encounter (HOSPITAL_COMMUNITY): Payer: Self-pay | Admitting: Emergency Medicine

## 2021-02-28 ENCOUNTER — Emergency Department (HOSPITAL_COMMUNITY)
Admission: EM | Admit: 2021-02-28 | Discharge: 2021-02-28 | Disposition: A | Discharge status: Home / Self Care | Payer: Medicare Other | Attending: Emergency Medicine | Admitting: Emergency Medicine

## 2021-02-28 ENCOUNTER — Emergency Department (HOSPITAL_COMMUNITY): Payer: Medicare Other

## 2021-02-28 ENCOUNTER — Inpatient Hospital Stay: Payer: Medicare Other | Admitting: Hematology and Oncology

## 2021-02-28 DIAGNOSIS — Z85828 Personal history of other malignant neoplasm of skin: Secondary | ICD-10-CM | POA: Insufficient documentation

## 2021-02-28 DIAGNOSIS — W1830XA Fall on same level, unspecified, initial encounter: Secondary | ICD-10-CM | POA: Insufficient documentation

## 2021-02-28 DIAGNOSIS — W19XXXA Unspecified fall, initial encounter: Secondary | ICD-10-CM

## 2021-02-28 DIAGNOSIS — R42 Dizziness and giddiness: Secondary | ICD-10-CM | POA: Diagnosis not present

## 2021-02-28 DIAGNOSIS — M79605 Pain in left leg: Secondary | ICD-10-CM | POA: Diagnosis not present

## 2021-02-28 DIAGNOSIS — Y92009 Unspecified place in unspecified non-institutional (private) residence as the place of occurrence of the external cause: Secondary | ICD-10-CM | POA: Insufficient documentation

## 2021-02-28 DIAGNOSIS — S59912A Unspecified injury of left forearm, initial encounter: Secondary | ICD-10-CM | POA: Diagnosis present

## 2021-02-28 DIAGNOSIS — S0990XA Unspecified injury of head, initial encounter: Secondary | ICD-10-CM | POA: Diagnosis not present

## 2021-02-28 DIAGNOSIS — Z79899 Other long term (current) drug therapy: Secondary | ICD-10-CM | POA: Insufficient documentation

## 2021-02-28 DIAGNOSIS — M25462 Effusion, left knee: Secondary | ICD-10-CM | POA: Diagnosis not present

## 2021-02-28 DIAGNOSIS — Y9301 Activity, walking, marching and hiking: Secondary | ICD-10-CM | POA: Insufficient documentation

## 2021-02-28 DIAGNOSIS — I959 Hypotension, unspecified: Secondary | ICD-10-CM | POA: Diagnosis not present

## 2021-02-28 DIAGNOSIS — S5012XA Contusion of left forearm, initial encounter: Secondary | ICD-10-CM | POA: Diagnosis not present

## 2021-02-28 DIAGNOSIS — N183 Chronic kidney disease, stage 3 unspecified: Secondary | ICD-10-CM | POA: Diagnosis not present

## 2021-02-28 DIAGNOSIS — S80919A Unspecified superficial injury of unspecified knee, initial encounter: Secondary | ICD-10-CM | POA: Diagnosis not present

## 2021-02-28 DIAGNOSIS — I129 Hypertensive chronic kidney disease with stage 1 through stage 4 chronic kidney disease, or unspecified chronic kidney disease: Secondary | ICD-10-CM | POA: Diagnosis not present

## 2021-02-28 DIAGNOSIS — M25562 Pain in left knee: Secondary | ICD-10-CM | POA: Diagnosis not present

## 2021-02-28 DIAGNOSIS — I10 Essential (primary) hypertension: Secondary | ICD-10-CM | POA: Diagnosis not present

## 2021-02-28 DIAGNOSIS — Z043 Encounter for examination and observation following other accident: Secondary | ICD-10-CM | POA: Diagnosis not present

## 2021-02-28 DIAGNOSIS — R0902 Hypoxemia: Secondary | ICD-10-CM | POA: Diagnosis not present

## 2021-02-28 LAB — BASIC METABOLIC PANEL
Anion gap: 7 (ref 5–15)
BUN: 45 mg/dL — ABNORMAL HIGH (ref 8–23)
CO2: 27 mmol/L (ref 22–32)
Calcium: 9.2 mg/dL (ref 8.9–10.3)
Chloride: 107 mmol/L (ref 98–111)
Creatinine, Ser: 1.63 mg/dL — ABNORMAL HIGH (ref 0.44–1.00)
GFR, Estimated: 31 mL/min — ABNORMAL LOW (ref >60)
Glucose, Bld: 90 mg/dL (ref 70–99)
Potassium: 5.3 mmol/L — ABNORMAL HIGH (ref 3.5–5.1)
Sodium: 141 mmol/L (ref 135–145)

## 2021-02-28 LAB — CBC WITH DIFFERENTIAL/PLATELET
Abs Immature Granulocytes: 0.02 10*3/uL (ref 0.00–0.07)
Basophils Absolute: 0 10*3/uL (ref 0.0–0.1)
Basophils Relative: 0 %
Eosinophils Absolute: 0.1 10*3/uL (ref 0.0–0.5)
Eosinophils Relative: 1 %
HCT: 29.1 % — ABNORMAL LOW (ref 36.0–46.0)
Hemoglobin: 9.1 g/dL — ABNORMAL LOW (ref 12.0–15.0)
Immature Granulocytes: 0 %
Lymphocytes Relative: 14 %
Lymphs Abs: 1.1 10*3/uL (ref 0.7–4.0)
MCH: 29.4 pg (ref 26.0–34.0)
MCHC: 31.3 g/dL (ref 30.0–36.0)
MCV: 93.9 fL (ref 80.0–100.0)
Monocytes Absolute: 0.9 10*3/uL (ref 0.1–1.0)
Monocytes Relative: 11 %
Neutro Abs: 5.7 10*3/uL (ref 1.7–7.7)
Neutrophils Relative %: 74 %
Platelets: 191 10*3/uL (ref 150–400)
RBC: 3.1 MIL/uL — ABNORMAL LOW (ref 3.87–5.11)
RDW: 17.2 % — ABNORMAL HIGH (ref 11.5–15.5)
WBC: 7.8 10*3/uL (ref 4.0–10.5)
nRBC: 0 % (ref 0.0–0.2)

## 2021-02-28 MED ORDER — ACETAMINOPHEN 325 MG PO TABS
650.0000 mg | ORAL_TABLET | Freq: Once | ORAL | Status: AC
  Administered 2021-02-28: 650 mg via ORAL
  Filled 2021-02-28: qty 2

## 2021-02-28 NOTE — ED Provider Notes (Signed)
[name] DEPT Provider Note   CSN: [id number] Arrival date & time: 02/28/21  1221     History Chief Complaint  Patient presents with   Carol Page is a 85 y.o. female.  HPI 85 year old female presents with left leg injury.  2 days ago she was walking in the house and got lightheaded and fell backwards.  Hit her head and has a small bump.  She is also on Plavix.  She does not think she lost consciousness.  She is also having left forearm pain and left leg pain.  She can walk on her leg but it hurts the more she walks.  Feels like her leg is swollen.  She has taken medicine for pain at home but cannot member which 1.  She states that she has been having issues with this lightheadedness and falls for a while and this is unchanged from before.  Past Medical History:  Diagnosis Date   Allergy    Arthritis    Cellulitis of left leg    Depression    History of blood transfusion    Hyperlipidemia    Hypertension    Pre-diabetes    Proctitis    Skin cancer of nose    Stroke [facility])    Ulcer    Urinary incontinence     Patient Active Problem List   Diagnosis Date Noted   Iron deficiency anemia 02/18/2021   Morbid obesity ([place]) 01/17/2021   Aortic atherosclerosis ([place]) 01/17/2021   CKD (chronic kidney disease) stage 3, GFR 30-59 ml/min (HCC) 01/08/2021   Obesity, Class III, BMI 40-49.9 (morbid obesity) ([place]) 01/08/2021   Anxiety and depression 10/06/2013   Urinary incontinence 10/06/2013   Chronic leg pain 10/06/2013   History of CVA (cerebrovascular accident) 10/06/2013   Essential hypertension 11/09/2012   HLD (hyperlipidemia) 11/09/2012   Pre-diabetes 11/09/2012   Seizure disorder ([place]) 11/09/2012    Past Surgical History:  Procedure Laterality Date   BILATERAL OOPHORECTOMY  06/2012   ovarian cysts   BIOPSY  01/10/2021   Procedure: BIOPSY;  Surgeon: Gatha Mayer, MD;  Location: [place];  Service: Endoscopy;;    COLONOSCOPY WITH PROPOFOL N/A 01/10/2021   Procedure: COLONOSCOPY WITH PROPOFOL;  Surgeon: Gatha Mayer, MD;  [facility];  Service: Endoscopy;  Laterality: N/A;   ESOPHAGOGASTRODUODENOSCOPY (EGD) WITH PROPOFOL N/A 01/10/2021   Procedure: ESOPHAGOGASTRODUODENOSCOPY (EGD) WITH PROPOFOL;  Surgeon: Gatha Mayer, MD;  Location: [place];  Service: Endoscopy;  Laterality: N/A;   SKIN CANCER EXCISION  2007   reomved from [place]       OB History   No obstetric history on file.     Family History  Problem Relation Age of Onset   Heart disease Other        Parent   Cancer Maternal Uncle     Social History   Tobacco Use   Smoking status: Never   Smokeless tobacco: Never  Substance Use Topics   Alcohol use: No   Drug use: No    Home Medications Prior to Admission medications   Medication Sig Start Date End Date Taking? Authorizing Provider  atorvastatin (LIPITOR) 80 MG tablet Take 1 tablet by mouth once daily Patient taking differently: Take 80 mg by mouth daily. 09/24/20  Yes Baity, Coralie Keens, NP  CALCIUM PO Take 1 tablet by mouth daily.   Yes [provider]  cholecalciferol (VITAMIN D)  1000 units tablet Take 1,000 Units by mouth daily.   Yes [provider]  clopidogrel (PLAVIX) 75 MG tablet Take 1 tablet (75 mg total) by mouth every evening. Resume on 6/27 01/11/21  Yes Kathie Dike, MD  diclofenac Sodium (VOLTAREN) 1 % GEL Apply 2 g topically 4 (four) times daily as needed (leg pain).   Yes [provider]  diltiazem (CARDIZEM CD) 180 MG 24 hr capsule Take 1 capsule by mouth once daily Patient taking differently: Take 180 mg by mouth daily. 10/11/20  Yes Jearld Fenton, NP  escitalopram (LEXAPRO) 10 MG tablet Take 1 tablet by mouth once daily Patient taking differently: Take 10 mg by mouth daily. 12/17/20  Yes Jearld Fenton, NP  gabapentin (NEURONTIN) 300 MG capsule Take 1 capsule by mouth twice  daily Patient taking differently: Take 300 mg by mouth 2 (two) times daily. 09/24/20  Yes Baity, Coralie Keens, NP  hydrochlorothiazide (MICROZIDE) 12.5 MG capsule Take 1 capsule by mouth once daily Patient taking differently: Take 12.5 mg by mouth daily. 09/24/20  Yes Baity, Coralie Keens, NP  Krill Oil 500 MG CAPS Take 500 mg by mouth in the morning and at bedtime.   Yes [provider]  lidocaine (LIDODERM) 5 % Place 1 patch onto the skin daily as needed (leg pain). Remove & Discard patch within 12 hours or as directed by MD   Yes [provider]  lisinopril (ZESTRIL) 5 MG tablet Take 1 tablet (5 mg total) by mouth daily. 02/18/21  Yes Jearld Fenton, NP  loratadine (CLARITIN) 10 MG tablet Take 10 mg by mouth daily.   Yes [provider]  pantoprazole (PROTONIX) 40 MG tablet Take 1 tablet (40 mg total) by mouth daily. 02/10/21  Yes Baity, Coralie Keens, NP  polyethylene glycol (MIRALAX / GLYCOLAX) 17 g packet Take 17 g by mouth daily as needed for mild constipation. 01/11/21  Yes Kathie Dike, MD  potassium chloride SA (KLOR-CON) 20 MEQ tablet TAKE 1  BY MOUTH ONCE DAILY Patient taking differently: Take 20 mEq by mouth daily. 12/10/20  Yes Jearld Fenton, NP  traZODone (DESYREL) 50 MG tablet TAKE 3 TABLETS BY MOUTH AT BEDTIME Patient taking differently: Take 150 mg by mouth at bedtime. 10/23/20  Yes Baity, Coralie Keens, NP  vitamin B-12 (CYANOCOBALAMIN) 1000 MCG tablet Take 1,000 mcg by mouth daily.   Yes [provider]    Allergies    Codeine and Ether  Review of Systems   Review of Systems  Constitutional:  Negative for fever.  Gastrointestinal:  Negative for vomiting.  Musculoskeletal:  Positive for arthralgias. Negative for neck pain.  Neurological:  Positive for headaches (mild). Negative for weakness.  All other systems reviewed and are negative.  Physical Exam Updated Vital Signs BP (!) 155/97   Pulse 79   Temp 99.2 F (37.3 C) (Oral)   Resp 20   Ht 5\' 2"   (1.575 m)   Wt 108.9 kg   SpO2 94%   BMI 43.90 kg/m   Physical Exam Vitals and nursing note reviewed.  Constitutional:      Appearance: She is well-developed.  HENT:     Head: Normocephalic and atraumatic.     Right Ear: External ear normal.     Left Ear: External ear normal.     Nose: Nose normal.  Eyes:     General:        Right eye: No discharge.        Left  eye: No discharge.  Cardiovascular:     Rate and Rhythm: Normal rate and regular rhythm.     Pulses:          Radial pulses are 2+ on the left side.       Dorsalis pedis pulses are 2+ on the left side.     Heart sounds: Murmur heard.  Pulmonary:     Effort: Pulmonary effort is normal.     Breath sounds: Normal breath sounds.  Abdominal:     Palpations: Abdomen is soft.     Tenderness: There is no abdominal tenderness.  Musculoskeletal:     Left forearm: Swelling (mild ecchymosis) and tenderness present.     Left wrist: No deformity or tenderness. Normal range of motion.     Left hand: No swelling or tenderness.     Cervical back: No tenderness.     Left knee: Normal range of motion. Tenderness (inferior) present.     Left lower leg: Tenderness present.     Left ankle: No tenderness. Normal range of motion.     Left foot: Normal range of motion. No tenderness.  Skin:    General: Skin is warm and dry.  Neurological:     Mental Status: She is alert and oriented to person, place, and time.     Comments: Awake, alert, oriented to person, place, time. No slurred speech. 5/5 strength in all 4 extremities  Psychiatric:        Mood and Affect: Mood is not anxious.    ED Results / Procedures / Treatments   Labs (all labs ordered are listed, but only abnormal results are displayed) Labs Reviewed  BASIC METABOLIC PANEL  CBC WITH DIFFERENTIAL/PLATELET    EKG EKG Interpretation  Date/Time:  Friday February 28 2021 13:52:17 EDT Ventricular Rate:  61 PR Interval:  200 QRS Duration: 86 QT Interval:  426 QTC  Calculation: 428 R Axis:   53 Text Interpretation: Normal sinus rhythm  no acute ST/T changes Confirmed by Sherwood Gambler [phone]) on 02/28/2021 2:15:51 PM  Radiology DG Forearm Left  Result Date: 02/28/2021 CLINICAL DATA:  Recent fall EXAM: LEFT FOREARM - 2 VIEW COMPARISON:  None. FINDINGS: There is no evidence of fracture or other focal bone lesions in the forearm. Alignment is unremarkable. The joint spaces are preserved. Soft tissues are unremarkable. Please note that these views are incomplete for full evaluation of the wrist. IMPRESSION: Negative for fracture or dislocation in the forearm. Electronically Signed   By: Merilyn Baba M.D.   On: 02/28/2021 14:04   DG Tibia/Fibula Left  Result Date: 02/28/2021 CLINICAL DATA:  Fall EXAM: LEFT TIBIA AND FIBULA - 2 VIEW; LEFT KNEE - COMPLETE 4+ VIEW COMPARISON:  None. FINDINGS: Knee: There is no acute fracture or dislocation. Alignment is normal. There is moderate medial and mild lateral tibiofemoral joint space narrowing with mild subchondral sclerosis and osteophytosis, consistent with osteoarthritis. There is a trace suprapatellar effusion. The soft tissues are otherwise unremarkable. Tibia/fibula: There is no acute fracture or dislocation. Alignment is normal. Vascular calcifications are noted. The soft tissues are otherwise unremarkable. IMPRESSION: No acute fracture or dislocation.  Trace suprapatellar effusion. Electronically Signed   By: Valetta Mole M.D.   On: 02/28/2021 14:04   CT HEAD WO CONTRAST (5MM)  Result Date: 02/28/2021 CLINICAL DATA:  Head trauma EXAM: CT HEAD WITHOUT CONTRAST TECHNIQUE: Contiguous axial images were obtained from the base of the skull through the vertex without intravenous contrast. COMPARISON:  Brain MRI 04/26/2018,  CT head 04/21/2018 FINDINGS: Brain: There is no acute intracranial hemorrhage, extra-axial fluid collection, or infarct. There is moderate parenchymal volume loss with commensurate enlargement of the  ventricular system. Confluent hypodensity throughout the subcortical and periventricular white matter likely reflects sequela of chronic white matter microangiopathy. No mass lesion is identified. There is no midline shift. Vascular: There is calcification of the bilateral cavernous ICAs. Skull: Normal. Negative for fracture or focal lesion. Sinuses/Orbits: The imaged paranasal sinuses are clear. The mastoid air cells are clear. The globes and orbits are unremarkable. Other: None. IMPRESSION: 1. No acute intracranial hemorrhage or calvarial fracture. 2. Global parenchymal volume loss and chronic white matter microangiopathy, similar to 2019. Electronically Signed   By: Valetta Mole M.D.   On: 02/28/2021 14:10   DG Knee Complete 4 Views Left  Result Date: 02/28/2021 CLINICAL DATA:  Fall EXAM: LEFT TIBIA AND FIBULA - 2 VIEW; LEFT KNEE - COMPLETE 4+ VIEW COMPARISON:  None. FINDINGS: Knee: There is no acute fracture or dislocation. Alignment is normal. There is moderate medial and mild lateral tibiofemoral joint space narrowing with mild subchondral sclerosis and osteophytosis, consistent with osteoarthritis. There is a trace suprapatellar effusion. The soft tissues are otherwise unremarkable. Tibia/fibula: There is no acute fracture or dislocation. Alignment is normal. Vascular calcifications are noted. The soft tissues are otherwise unremarkable. IMPRESSION: No acute fracture or dislocation.  Trace suprapatellar effusion. Electronically Signed   By: Valetta Mole M.D.   On: 02/28/2021 14:04    Procedures Procedures   Medications Ordered in ED Medications  acetaminophen (TYLENOL) tablet 650 mg (has no administration in time range)    ED Course  I have reviewed the triage vital signs and the nursing notes.  Pertinent labs & imaging results that were available during my care of the patient were reviewed by me and considered in my medical decision making (see chart for details).    MDM  Rules/Calculators/A&P                           Patient has no obvious fractures.  Given her head injury with Plavix use a CT was obtained and is also negative.  Given her recent history of GI bleeding, CBC and BMP will be obtained though currently are not drawn by nursing staff.  Her vitals are stable but she will need to at least rule out that she has new anemia.  However the patient tells me that this dizziness has been a chronic problem and the son-in-law endorses that she has been having falls often, which is one of the reason they live with her.  Care to Dr. Darl Householder. Final Clinical Impression(s) / ED Diagnoses Final diagnoses:  None    Rx / DC Orders ED Discharge Orders     None        Sherwood Gambler, MD 02/28/21 [phone]

## 2021-02-28 NOTE — ED Notes (Signed)
Pt on bedpan.

## 2021-02-28 NOTE — ED Triage Notes (Signed)
Patient BIBA from home, EMS reports patient fell 2 days ago and has since had left knee pain w/ weight bearing and difficulty ambulating. She also reports left wrist pain. Denies hitting head, no LOC. Patient on plavix. Hx CVA, HTN, seizures  VS WDL

## 2021-02-28 NOTE — Discharge Instructions (Addendum)
Take Tylenol for pain.  See your doctor for follow-up.    You do not have any fractures today.   Please walk with assistance and use a walker  Return to ER if you have another fall, headache, vomiting, dizziness

## 2021-02-28 NOTE — ED Provider Notes (Signed)
  Physical Exam  BP (!) 155/97   Pulse 79   Temp 99.2 F (37.3 C) (Oral)   Resp 20   Ht 5\' 2"  (1.575 m)   Wt 108.9 kg   SpO2 94%   BMI 43.90 kg/m   Physical Exam  ED Course/Procedures     Procedures  MDM  Care assumed at 3 PM.  Patient is here with fall.  Patient was supposed to see hematology today for repeat CBC as well.  Denies any blood in the stool.  CT head neck and x-rays were negative.  Signout pending CBC and BMP  4:50 PM CBC showed hemoglobin 9.1 which is stable.  BMP showed creatinine of 1.6 which is baseline.  Potassium is 5.3 which is likely hemolyzed.  Patient was able to ambulate with assistance.  Patient has a walker and commode at home.  Stable for discharge       Drenda Freeze, MD 02/28/21 1650

## 2021-02-28 NOTE — ED Notes (Signed)
Blue top tube sent to lab. 

## 2021-02-28 NOTE — ED Notes (Signed)
Pt in CT. Will obtain EKG when pt returns.

## 2021-03-16 ENCOUNTER — Telehealth: Payer: Self-pay | Admitting: Internal Medicine

## 2021-03-17 NOTE — Telephone Encounter (Signed)
Requested medication (s) are due for refill today: Yes  Requested medication (s) are on the active medication list: Yes  Last refill:  12/10/20  Future visit scheduled: No  Notes to clinic:  Pt. Needs labs.    Requested Prescriptions  Pending Prescriptions Disp Refills   potassium chloride SA (KLOR-CON) 20 MEQ tablet [Pharmacy Med Name: Potassium Chloride Crys ER 20 MEQ Oral Tablet Extended Release] 90 tablet 0    Sig: TAKE 1  BY MOUTH ONCE DAILY     Endocrinology:  Minerals - Potassium Supplementation Failed - 03/16/2021  9:50 PM      Failed - K in normal range and within 360 days    Potassium  Date Value Ref Range Status  02/28/2021 5.3 (H) 3.5 - 5.1 mmol/L Final          Failed - Cr in normal range and within 360 days    Creat  Date Value Ref Range Status  02/17/2021 1.81 (H) 0.60 - 0.95 mg/dL Final   Creatinine, Ser  Date Value Ref Range Status  02/28/2021 1.63 (H) 0.44 - 1.00 mg/dL Final          Passed - Valid encounter within last 12 months    Recent Outpatient Visits           4 weeks ago Iron deficiency anemia due to chronic blood loss   [place], Coralie Keens, NP   2 months ago Symptomatic anemia   [facility] [place], Coralie Keens, [place]

## 2021-03-17 NOTE — Telephone Encounter (Signed)
Potassium was elevated at her recent ER visit.  This needs to be rechecked before potassium is refilled

## 2021-03-18 ENCOUNTER — Other Ambulatory Visit: Payer: Self-pay | Admitting: Internal Medicine

## 2021-03-18 MED ORDER — POTASSIUM CHLORIDE CRYS ER 20 MEQ PO TBCR: EXTENDED_RELEASE_TABLET | ORAL | 0 refills | Status: DC

## 2021-03-18 NOTE — Telephone Encounter (Signed)
Pt's son in law wants to speak with nurse, has questions concerning pt's potassium levels.   Best contact: [phone]

## 2021-03-19 ENCOUNTER — Other Ambulatory Visit: Payer: Self-pay

## 2021-03-19 ENCOUNTER — Other Ambulatory Visit: Payer: Self-pay | Admitting: Internal Medicine

## 2021-03-19 DIAGNOSIS — E875 Hyperkalemia: Secondary | ICD-10-CM

## 2021-03-19 NOTE — Telephone Encounter (Signed)
noted 

## 2021-03-19 NOTE — Telephone Encounter (Signed)
I spoke with the patient daughter and scheduled an appt for a lab only on Friday to get her Potassium checked.

## 2021-03-20 ENCOUNTER — Other Ambulatory Visit: Payer: Self-pay

## 2021-03-20 DIAGNOSIS — E875 Hyperkalemia: Secondary | ICD-10-CM

## 2021-03-21 ENCOUNTER — Other Ambulatory Visit: Payer: Medicare Other

## 2021-03-21 ENCOUNTER — Other Ambulatory Visit: Payer: Self-pay

## 2021-03-21 DIAGNOSIS — E875 Hyperkalemia: Secondary | ICD-10-CM | POA: Diagnosis not present

## 2021-03-21 DIAGNOSIS — D649 Anemia, unspecified: Secondary | ICD-10-CM

## 2021-03-22 LAB — COMPLETE METABOLIC PANEL WITH GFR
AG Ratio: 1.5 (calc) (ref 1.0–2.5)
ALT: 12 U/L (ref 6–29)
AST: 14 U/L (ref 10–35)
Albumin: 4 g/dL (ref 3.6–5.1)
Alkaline phosphatase (APISO): 78 U/L (ref 37–153)
BUN/Creatinine Ratio: 19 (calc) (ref 6–22)
BUN: 36 mg/dL — ABNORMAL HIGH (ref 7–25)
CO2: 26 mmol/L (ref 20–32)
Calcium: 9.1 mg/dL (ref 8.6–10.4)
Chloride: 106 mmol/L (ref 98–110)
Creat: 1.87 mg/dL — ABNORMAL HIGH (ref 0.60–0.95)
Globulin: 2.6 g/dL (calc) (ref 1.9–3.7)
Glucose, Bld: 169 mg/dL — ABNORMAL HIGH (ref 65–139)
Potassium: 4.4 mmol/L (ref 3.5–5.3)
Sodium: 142 mmol/L (ref 135–146)
Total Bilirubin: 0.4 mg/dL (ref 0.2–1.2)
Total Protein: 6.6 g/dL (ref 6.1–8.1)
eGFR: 26 mL/min/{1.73_m2} — ABNORMAL LOW (ref >=60)

## 2021-03-23 ENCOUNTER — Other Ambulatory Visit: Payer: Self-pay | Admitting: Internal Medicine

## 2021-03-23 ENCOUNTER — Other Ambulatory Visit: Payer: Self-pay

## 2021-03-23 NOTE — Progress Notes (Signed)
Lexapro accidentally refilled- end date is today 03/23/21.

## 2021-03-23 NOTE — Telephone Encounter (Signed)
Requested Prescriptions  Pending Prescriptions Disp Refills  . escitalopram (LEXAPRO) 10 MG tablet [Pharmacy Med Name: Escitalopram Oxalate 10 MG Oral Tablet] 90 tablet 0    Sig: Take 1 tablet by mouth once daily     Psychiatry:  Antidepressants - SSRI Passed - 03/23/2021 12:02 PM      Passed - Completed PHQ-2 or PHQ-9 in the last 360 days      Passed - Valid encounter within last 6 months    Recent Outpatient Visits          1 month ago Iron deficiency anemia due to chronic blood loss   [place], [place], NP   2 months ago Symptomatic anemia   [facility] [place], [place], NP             . clopidogrel (PLAVIX) 75 MG tablet [Pharmacy Med Name: Clopidogrel Bisulfate 75 MG Oral Tablet] 90 tablet     Sig: Take 1 tablet by mouth in the evening     Hematology: Antiplatelets - clopidogrel Failed - 03/23/2021 12:02 PM      Failed - Evaluate AST, ALT within 2 months of therapy initiation.      Failed - HCT in normal range and within 180 days    HCT  Date Value Ref Range Status  02/28/2021 29.1 (L) 36.0 - 46.0 % Final         Failed - HGB in normal range and within 180 days    Hemoglobin  Date Value Ref Range Status  02/28/2021 9.1 (L) 12.0 - 15.0 g/dL Final         Passed - ALT in normal range and within 360 days    ALT  Date Value Ref Range Status  03/21/2021 12 6 - 29 U/L Final         Passed - AST in normal range and within 360 days    AST  Date Value Ref Range Status  03/21/2021 14 10 - 35 U/L Final         Passed - PLT in normal range and within 180 days    Platelets  Date Value Ref Range Status  02/28/2021 191 150 - 400 K/uL Final         Passed - Valid encounter within last 6 months    Recent Outpatient Visits          1 month ago Iron deficiency anemia due to chronic blood loss   [place], Coralie Keens, NP   2 months ago Symptomatic anemia   [facility] [place], Coralie Keens, [place]

## 2021-03-23 NOTE — Telephone Encounter (Signed)
Prescriber not at this practice.

## 2021-03-25 ENCOUNTER — Other Ambulatory Visit: Payer: Self-pay | Admitting: Internal Medicine

## 2021-03-26 ENCOUNTER — Other Ambulatory Visit: Payer: Self-pay | Admitting: Internal Medicine

## 2021-03-26 DIAGNOSIS — N184 Chronic kidney disease, stage 4 (severe): Secondary | ICD-10-CM | POA: Insufficient documentation

## 2021-03-26 MED ORDER — ESCITALOPRAM OXALATE 10 MG PO TABS: 10.0000 mg | ORAL_TABLET | Freq: Every day | ORAL | 0 refills | Status: DC

## 2021-03-26 NOTE — Progress Notes (Signed)
She needs to continue Lexapro

## 2021-03-26 NOTE — Telephone Encounter (Signed)
Requested medications are due for refill today.  Unknown  Requested medications are on the active medications list.  yes  Last refill. 01/11/2021  Future visit scheduled.   no  Notes to clinic.  Rx was signed by Dr. Roderic Palau.

## 2021-03-26 NOTE — Progress Notes (Signed)
Referral to nephrology placed.

## 2021-03-27 ENCOUNTER — Inpatient Hospital Stay: Payer: Medicare Other | Attending: Hematology and Oncology | Admitting: Oncology

## 2021-03-27 ENCOUNTER — Inpatient Hospital Stay: Payer: Medicare Other

## 2021-03-27 ENCOUNTER — Other Ambulatory Visit: Payer: Self-pay

## 2021-03-27 VITALS — BP 153/75 | HR 85 | Temp 99.1°F | Resp 16 | Wt 234.3 lb

## 2021-03-27 DIAGNOSIS — Z90722 Acquired absence of ovaries, bilateral: Secondary | ICD-10-CM | POA: Diagnosis not present

## 2021-03-27 DIAGNOSIS — Z8673 Personal history of transient ischemic attack (TIA), and cerebral infarction without residual deficits: Secondary | ICD-10-CM | POA: Insufficient documentation

## 2021-03-27 DIAGNOSIS — N189 Chronic kidney disease, unspecified: Secondary | ICD-10-CM | POA: Insufficient documentation

## 2021-03-27 DIAGNOSIS — I10 Essential (primary) hypertension: Secondary | ICD-10-CM | POA: Diagnosis not present

## 2021-03-27 DIAGNOSIS — D508 Other iron deficiency anemias: Secondary | ICD-10-CM | POA: Diagnosis not present

## 2021-03-27 DIAGNOSIS — Z85828 Personal history of other malignant neoplasm of skin: Secondary | ICD-10-CM | POA: Insufficient documentation

## 2021-03-27 DIAGNOSIS — Z79899 Other long term (current) drug therapy: Secondary | ICD-10-CM | POA: Insufficient documentation

## 2021-03-27 LAB — CBC WITH DIFFERENTIAL/PLATELET
Abs Immature Granulocytes: 0.02 10*3/uL (ref 0.00–0.07)
Basophils Absolute: 0 10*3/uL (ref 0.0–0.1)
Basophils Relative: 0 %
Eosinophils Absolute: 0.2 10*3/uL (ref 0.0–0.5)
Eosinophils Relative: 3 %
HCT: 30.2 % — ABNORMAL LOW (ref 36.0–46.0)
Hemoglobin: 9.7 g/dL — ABNORMAL LOW (ref 12.0–15.0)
Immature Granulocytes: 0 %
Lymphocytes Relative: 17 %
Lymphs Abs: 1.2 10*3/uL (ref 0.7–4.0)
MCH: 29.9 pg (ref 26.0–34.0)
MCHC: 32.1 g/dL (ref 30.0–36.0)
MCV: 93.2 fL (ref 80.0–100.0)
Monocytes Absolute: 0.6 10*3/uL (ref 0.1–1.0)
Monocytes Relative: 9 %
Neutro Abs: 4.9 10*3/uL (ref 1.7–7.7)
Neutrophils Relative %: 71 %
Platelets: 171 10*3/uL (ref 150–400)
RBC: 3.24 MIL/uL — ABNORMAL LOW (ref 3.87–5.11)
RDW: 16.1 % — ABNORMAL HIGH (ref 11.5–15.5)
WBC: 6.8 10*3/uL (ref 4.0–10.5)
nRBC: 0 % (ref 0.0–0.2)

## 2021-03-27 LAB — IRON AND TIBC
Iron: 41 ug/dL (ref 41–142)
Saturation Ratios: 12 % — ABNORMAL LOW (ref 21–57)
TIBC: 338 ug/dL (ref 236–444)
UIBC: 296 ug/dL (ref 120–384)

## 2021-03-27 LAB — FERRITIN: Ferritin: 5 ng/mL — ABNORMAL LOW (ref 11–307)

## 2021-03-27 NOTE — Progress Notes (Signed)
Reason for the request:    Anemia  HPI: I was asked by Webb Silversmith, NP to evaluate Carol Page for evaluation of anemia.  She is an 85 year old woman with history of hypertension diabetes who was hospitalized in June 2022 with symptomatic anemia and GI bleeding.  At that time her work-up showed a gastric antral vascular ectasia on endoscopy.  She received packed red cell transfusion as well as 200 mg of Venofer.  Her hemoglobin improved from 7.0 up to 10.6.  Laboratory data on February 28, 2021 showed a hemoglobin of 9.1 with MCV of 93.9 and normal platelet count and white cell count.  Differential is normal.  Iron studies obtained on January 06, 2021 showed iron level 22 and ferritin of 8.  Clinically, he reports no major complaints although she is not very reliable historian for her family.  She denies any shortness of breath or difficulty breathing.  She is normal reporting any change in the color of her stool.  No hematochezia, melena or hemoptysis.  She does not report any headaches, blurry vision, syncope or seizures. Does not report any fevers, chills or sweats.  Does not report any cough, wheezing or hemoptysis.  Does not report any chest pain, palpitation, orthopnea or leg edema.  Does not report any nausea, vomiting or abdominal pain.  Does not report any constipation or diarrhea.  Does not report any skeletal complaints.    Does not report frequency, urgency or hematuria.  Does not report any skin rashes or lesions. Does not report any heat or cold intolerance.  Does not report any lymphadenopathy or petechiae.  Does not report any anxiety or depression.  Remaining review of systems is negative.     Past Medical History:  Diagnosis Date   Allergy    Arthritis    Cellulitis of left leg    Depression    History of blood transfusion    Hyperlipidemia    Hypertension    Pre-diabetes    Proctitis    Skin cancer of nose    Stroke [facility])    Ulcer    Urinary incontinence   :   Past Surgical  History:  Procedure Laterality Date   BILATERAL OOPHORECTOMY  06/2012   ovarian cysts   BIOPSY  01/10/2021   Procedure: BIOPSY;  Surgeon: Gatha Mayer, MD;  Location: [place];  Service: Endoscopy;;   COLONOSCOPY WITH PROPOFOL N/A 01/10/2021   Procedure: COLONOSCOPY WITH PROPOFOL;  Surgeon: Gatha Mayer, MD;  Location: [facility] ENDOSCOPY;  Service: Endoscopy;  Laterality: N/A;   ESOPHAGOGASTRODUODENOSCOPY (EGD) WITH PROPOFOL N/A 01/10/2021   Procedure: ESOPHAGOGASTRODUODENOSCOPY (EGD) WITH PROPOFOL;  Surgeon: Gatha Mayer, MD;  Location: [place];  Service: Endoscopy;  Laterality: N/A;   SKIN CANCER EXCISION  2007   reomved from nose   TONSILLECTOMY  1944   TUBAL LIGATION    :   Current Outpatient Medications:    atorvastatin (LIPITOR) 80 MG tablet, Take 1 tablet by mouth once daily (Patient taking differently: Take 80 mg by mouth daily.), Disp: 90 tablet, Rfl: 3   CALCIUM PO, Take 1 tablet by mouth daily., Disp: , Rfl:    cholecalciferol (VITAMIN D) 1000 units tablet, Take 1,000 Units by mouth daily., Disp: , Rfl:    clopidogrel (PLAVIX) 75 MG tablet, Take 1 tablet by mouth in the evening, Disp: 90 tablet, Rfl: 0   diclofenac Sodium (VOLTAREN) 1 % GEL, Apply 2 g topically 4 (four) times daily as needed (leg pain)., Disp: ,  Rfl:    diltiazem (CARDIZEM CD) 180 MG 24 hr capsule, Take 1 capsule by mouth once daily (Patient taking differently: Take 180 mg by mouth daily.), Disp: 90 capsule, Rfl: 3   escitalopram (LEXAPRO) 10 MG tablet, Take 1 tablet (10 mg total) by mouth daily., Disp: 90 tablet, Rfl: 0   gabapentin (NEURONTIN) 300 MG capsule, Take 1 capsule by mouth twice daily (Patient taking differently: Take 300 mg by mouth 2 (two) times daily.), Disp: 180 capsule, Rfl: 3   hydrochlorothiazide (MICROZIDE) 12.5 MG capsule, Take 1 capsule by mouth once daily (Patient taking differently: Take 12.5 mg by mouth daily.), Disp: 90 capsule, Rfl: 3   Krill Oil 500 MG CAPS, Take 500 mg by  mouth in the morning and at bedtime., Disp: , Rfl:    lidocaine (LIDODERM) 5 %, Place 1 patch onto the skin daily as needed (leg pain). Remove & Discard patch within 12 hours or as directed by MD, Disp: , Rfl:    lisinopril (ZESTRIL) 5 MG tablet, Take 1 tablet (5 mg total) by mouth daily., Disp: 90 tablet, Rfl: 1   loratadine (CLARITIN) 10 MG tablet, Take 10 mg by mouth daily., Disp: , Rfl:    pantoprazole (PROTONIX) 40 MG tablet, Take 1 tablet (40 mg total) by mouth daily., Disp: 30 tablet, Rfl: 2   polyethylene glycol (MIRALAX / GLYCOLAX) 17 g packet, Take 17 g by mouth daily as needed for mild constipation., Disp: 14 each, Rfl: 0   potassium chloride SA (KLOR-CON) 20 MEQ tablet, TAKE 1  BY MOUTH ONCE DAILY, Disp: 90 tablet, Rfl: 0   traZODone (DESYREL) 50 MG tablet, TAKE 3 TABLETS BY MOUTH AT BEDTIME (Patient taking differently: Take 150 mg by mouth at bedtime.), Disp: 270 tablet, Rfl: 2   vitamin B-12 (CYANOCOBALAMIN) 1000 MCG tablet, Take 1,000 mcg by mouth daily., Disp: , Rfl: :   Allergies  Allergen Reactions   Codeine Nausea And Vomiting   Ether Nausea And Vomiting  :   Family History  Problem Relation Age of Onset   Heart disease Other        Parent   Cancer Maternal Uncle   :   Social History   Socioeconomic History   Marital status: Widowed    Spouse name: Not on file   Number of children: 4   Years of education: 12   Highest education level: Not on file  Occupational History   Occupation: Retired  Tobacco Use   Smoking status: Never   Smokeless tobacco: Never  Substance and Sexual Activity   Alcohol use: No   Drug use: No   Sexual activity: Never  Other Topics Concern   Not on file  Social History Narrative   Regular exercise-no   Caffeine Use-yes   Social Determinants of Health   Financial Resource Strain: Not on file  Food Insecurity: Not on file  Transportation Needs: Not on file  Physical Activity: Not on file  Stress: Not on file  Social  Connections: Not on file  Intimate Partner Violence: Not on file  :  Pertinent items are noted in HPI.  Exam:  General appearance: alert and cooperative appeared without distress. Head: atraumatic without any abnormalities. Eyes: conjunctivae/corneas clear. PERRL.  Sclera anicteric. Throat: lips, mucosa, and tongue normal; without oral thrush or ulcers. Resp: clear to auscultation bilaterally without rhonchi, wheezes or dullness to percussion. Cardio: regular rate and rhythm, S1, S2 normal, no murmur, click, rub or gallop GI: soft, non-tender; bowel sounds  normal; no masses,  no organomegaly Skin: Skin color, texture, turgor normal. No rashes or lesions Lymph nodes: Cervical, supraclavicular, and axillary nodes normal. Neurologic: Grossly normal without any motor, sensory or deep tendon reflexes. Musculoskeletal: No joint deformity or effusion.     Assessment and Plan:   85 year old woman with:  1.  Iron deficiency anemia noted in June 2022 after presenting with GI bleed.  She presented with a hemoglobin of 7, iron level of 22 and ferritin of 8.  He received packed red cell transfusion as well as Venna for 200 mg intravenously.  Her hemoglobin improved to 9.1 recently.  Management options moving forward were discussed.  She does not appear to have ongoing GI bleed at this time and has received reasonable amount of iron between transfusion and benefit.  He is not on any iron supplements at this time.  Restarting oral iron replacement versus intravenous iron versus taking multivitamins were discussed.  Risks and benefits of all these approaches were discussed at this time.  I do not feel the need for urgent intravenous iron infusion at this time.  Oral iron therapy can cause constipation and dyspepsia which concerning for her.  After discussion today, I recommended updating laboratory data including iron studies and give the final recommendations accordingly.  Daily oral iron replacement  with adequate stool softeners could be reasonable if she is deficient.  2.  Chronic renal insufficiency: Creatinine clearance remains low around 26 cc/min.  This could also be contributing to her anemia growth factor support could be considered in the future.  2.  Follow-up: I am happy to see her in the future as needed.  45  minutes were dedicated to this visit. The time was spent on reviewing laboratory data, discussing treatment options, discussing differential diagnosis and answering questions regarding future plan.    A copy of this consult has been forwarded to the requesting provider

## 2021-03-28 ENCOUNTER — Telehealth: Payer: Self-pay | Admitting: *Deleted

## 2021-03-28 NOTE — Telephone Encounter (Signed)
Contacted patient's daughter with results message from Dr. Alen Blew. Daughter verbalized understanding.Encouraged to contact office for further questions or concerns.

## 2021-03-28 NOTE — Telephone Encounter (Signed)
-----   Message from Wyatt Portela, MD sent at 03/28/2021  8:57 AM EDT ----- Please let her (or her family) know the iron level is improving. She soul benefit from oral iron supplement. Once every other day over the counter supplement is fine if she can take it.

## 2021-04-30 ENCOUNTER — Encounter: Payer: Self-pay | Admitting: Internal Medicine

## 2021-04-30 ENCOUNTER — Other Ambulatory Visit: Payer: Self-pay

## 2021-04-30 ENCOUNTER — Ambulatory Visit (INDEPENDENT_AMBULATORY_CARE_PROVIDER_SITE_OTHER): Payer: Medicare Other | Admitting: Internal Medicine

## 2021-04-30 VITALS — BP 104/47 | HR 74 | Temp 97.3°F | Resp 18 | Ht 62.0 in | Wt 232.6 lb

## 2021-04-30 DIAGNOSIS — F32A Depression, unspecified: Secondary | ICD-10-CM

## 2021-04-30 DIAGNOSIS — R400 Somnolence: Secondary | ICD-10-CM

## 2021-04-30 DIAGNOSIS — F419 Anxiety disorder, unspecified: Secondary | ICD-10-CM | POA: Diagnosis not present

## 2021-04-30 DIAGNOSIS — E559 Vitamin D deficiency, unspecified: Secondary | ICD-10-CM | POA: Diagnosis not present

## 2021-04-30 DIAGNOSIS — R531 Weakness: Secondary | ICD-10-CM

## 2021-04-30 DIAGNOSIS — E538 Deficiency of other specified B group vitamins: Secondary | ICD-10-CM

## 2021-04-30 DIAGNOSIS — Z23 Encounter for immunization: Secondary | ICD-10-CM

## 2021-04-30 MED ORDER — TRAZODONE HCL 50 MG PO TABS: 100.0000 mg | ORAL_TABLET | Freq: Every day | ORAL | 0 refills | Status: DC

## 2021-04-30 MED ORDER — BUPROPION HCL ER (SR) 100 MG PO TB12: 100.0000 mg | ORAL_TABLET | Freq: Two times a day (BID) | ORAL | 0 refills | Status: DC

## 2021-04-30 NOTE — Progress Notes (Signed)
Subjective:    Patient ID: Carol Page, female    DOB: 1935/07/24, 85 y.o.   MRN: [id number]  HPI  Patient presents the clinic today with complaint of "feeling lazy all the time".  She reports she sleeps well at night.  Her son and mom reports that she naps a lot during the day also.  Her appetite is good however her daughter reports she really mostly snacks on sweets and drinks Ginger Ale all day.  She does not really have a set routine anymore like she has had in the past.  Her son will reports that she isolates herself in her room and does not come out to the living room to socialize with them which is a change.  She reports she likes being alone.  She denies any overt anxiety or depression but has had chronic dysthymia managed on Escitalopram which her daughter feels like should be increased at this time.  Carol Page is dependent upon her daughter and son-in-law for care which they provided for the last 8 years.  They bring her breakfast and medicine in bed.  She can walk only a few steps with a walker.  She mostly stands and pivots to get on the bedside commode and back to the bed.  Her daughter reports she does not shower often and they are having more trouble getting her out of the house to doctors appointments.  Review of Systems     Past Medical History:  Diagnosis Date   Allergy    Arthritis    Cellulitis of left leg    Depression    History of blood transfusion    Hyperlipidemia    Hypertension    Pre-diabetes    Proctitis    Skin cancer of nose    Stroke (HCC)    Ulcer    Urinary incontinence     Current Outpatient Medications  Medication Sig Dispense Refill   atorvastatin (LIPITOR) 80 MG tablet Take 1 tablet by mouth once daily (Patient taking differently: Take 80 mg by mouth daily.) 90 tablet 3   CALCIUM PO Take 1 tablet by mouth daily.     cholecalciferol (VITAMIN D) 1000 units tablet Take 1,000 Units by mouth daily.     clopidogrel (PLAVIX) 75 MG tablet Take 1  tablet by mouth in the evening 90 tablet 0   diclofenac Sodium (VOLTAREN) 1 % GEL Apply 2 g topically 4 (four) times daily as needed (leg pain).     diltiazem (CARDIZEM CD) 180 MG 24 hr capsule Take 1 capsule by mouth once daily (Patient taking differently: Take 180 mg by mouth daily.) 90 capsule 3   escitalopram (LEXAPRO) 10 MG tablet Take 1 tablet (10 mg total) by mouth daily. 90 tablet 0   gabapentin (NEURONTIN) 300 MG capsule Take 1 capsule by mouth twice daily (Patient taking differently: Take 300 mg by mouth 2 (two) times daily.) 180 capsule 3   hydrochlorothiazide (MICROZIDE) 12.5 MG capsule Take 1 capsule by mouth once daily (Patient taking differently: Take 12.5 mg by mouth daily.) 90 capsule 3   Krill Oil 500 MG CAPS Take 500 mg by mouth in the morning and at bedtime.     lidocaine (LIDODERM) 5 % Place 1 patch onto the skin daily as needed (leg pain). Remove & Discard patch within 12 hours or as directed by MD     lisinopril (ZESTRIL) 5 MG tablet Take 1 tablet (5 mg total) by mouth daily. 90 tablet 1  loratadine (CLARITIN) 10 MG tablet Take 10 mg by mouth daily.     pantoprazole (PROTONIX) 40 MG tablet Take 1 tablet (40 mg total) by mouth daily. 30 tablet 2   polyethylene glycol (MIRALAX / GLYCOLAX) 17 g packet Take 17 g by mouth daily as needed for mild constipation. 14 each 0   potassium chloride SA (KLOR-CON) 20 MEQ tablet TAKE 1  BY MOUTH ONCE DAILY 90 tablet 0   traZODone (DESYREL) 50 MG tablet TAKE 3 TABLETS BY MOUTH AT BEDTIME (Patient taking differently: Take 150 mg by mouth at bedtime.) 270 tablet 2   vitamin B-12 (CYANOCOBALAMIN) 1000 MCG tablet Take 1,000 mcg by mouth daily.     No current facility-administered medications for this visit.    Allergies  Allergen Reactions   Codeine Nausea And Vomiting   Ether Nausea And Vomiting    Family History  Problem Relation Age of Onset   Heart disease Other        Parent   Cancer Maternal Uncle     Social History    Socioeconomic History   Marital status: Widowed    Spouse name: Not on file   Number of children: 4   Years of education: 12   Highest education level: Not on file  Occupational History   Occupation: Retired  Tobacco Use   Smoking status: Never   Smokeless tobacco: Never  Substance and Sexual Activity   Alcohol use: No   Drug use: No   Sexual activity: Never  Other Topics Concern   Not on file  Social History Narrative   Regular exercise-no   Caffeine Use-yes   Social Determinants of Health   Financial Resource Strain: Not on file  Food Insecurity: Not on file  Transportation Needs: Not on file  Physical Activity: Not on file  Stress: Not on file  Social Connections: Not on file  Intimate Partner Violence: Not on file     Constitutional: Denies fever, malaise, fatigue, headache or abrupt weight changes.  HEENT: Denies eye pain, eye redness, ear pain, ringing in the ears, wax buildup, runny nose, nasal congestion, bloody nose, or sore throat. Respiratory: Denies difficulty breathing, shortness of breath, cough or sputum production.   Cardiovascular: Patient has swelling in the legs.  Denies chest pain, chest tightness, palpitations or swelling in the hands.  Gastrointestinal: Denies abdominal pain, bloating, constipation, diarrhea or blood in the stool.  GU: Denies urgency, frequency, pain with urination, burning sensation, blood in urine, odor or discharge. Musculoskeletal: Patient reports bilateral leg pain, difficulty with gait.  Denies joint pain and swelling.  Skin: Denies redness, rashes, lesions or ulcercations.  Neurological: Patient reports difficulty with balance.  Denies dizziness, difficulty with memory, difficulty with speech or problems with coordination.  Psych: Patient has a history of anxiety and depression.  Denies SI/HI.  No other specific complaints in a complete review of systems (except as listed in HPI above).  Objective:   Physical Exam  BP  (!) 104/47 (BP Location: Right Arm, Patient Position: Sitting, Cuff Size: Large)   Pulse 74   Temp (!) 97.3 F (36.3 C) (Temporal)   Resp 18   Ht 5\' 2"  (1.575 m)   Wt 232 lb 9.6 oz (105.5 kg)   SpO2 97%   BMI 42.54 kg/m   Wt Readings from Last 3 Encounters:  03/27/21 234 lb 5 oz (106.3 kg)  02/28/21 240 lb (108.9 kg)  02/17/21 241 lb (109.3 kg)    General: Appears her stated  age, obese, in NAD. Skin: Warm, dry and intact.  Neck:  Neck supple, trachea midline. No masses, lumps or thyromegaly present.  Cardiovascular: Normal rate and rhythm. S1,S2 noted.  No murmur, rubs or gallops noted.  Pulmonary/Chest: Normal effort and positive vesicular breath sounds. No respiratory distress. No wheezes, rales or ronchi noted.  Musculoskeletal: In a wheelchair. Neurological: Alert and oriented.  Psychiatric: Mood and affect flat. Behavior is normal. Judgment and thought content normal.    BMET    Component Value Date/Time   NA 142 03/21/2021 1055   K 4.4 03/21/2021 1055   CL 106 03/21/2021 1055   CO2 26 03/21/2021 1055   GLUCOSE 169 (H) 03/21/2021 1055   BUN 36 (H) 03/21/2021 1055   CREATININE 1.87 (H) 03/21/2021 1055   CALCIUM 9.1 03/21/2021 1055   GFRNONAA 31 (L) 02/28/2021 1522   GFRAA 56 (L) 04/26/2018 0417    Lipid Panel     Component Value Date/Time   CHOL 121 08/28/2020 1419   TRIG 89.0 08/28/2020 1419   HDL 43.10 08/28/2020 1419   CHOLHDL 3 08/28/2020 1419   VLDL 17.8 08/28/2020 1419   LDLCALC 60 08/28/2020 1419   LDLCALC 75 03/24/2019 1557    CBC    Component Value Date/Time   WBC 6.8 03/27/2021 1421   RBC 3.24 (L) 03/27/2021 1421   HGB 9.7 (L) 03/27/2021 1421   HCT 30.2 (L) 03/27/2021 1421   PLT 171 03/27/2021 1421   MCV 93.2 03/27/2021 1421   MCH 29.9 03/27/2021 1421   MCHC 32.1 03/27/2021 1421   RDW 16.1 (H) 03/27/2021 1421   LYMPHSABS 1.2 03/27/2021 1421   MONOABS 0.6 03/27/2021 1421   EOSABS 0.2 03/27/2021 1421   BASOSABS 0.0 03/27/2021 1421     Hgb A1C Lab Results  Component Value Date   HGBA1C 5.3 08/28/2020           Assessment & Plan:   Generalized Weakness, Anxiety and Depression, Daytime Sleepiness:  We will check CBC, vitamin D and B12 I think she would benefit more from adding Wellbutrin 100 mg SR 2 times daily then increasing her Escitalopram to 20 mg daily Will decrease trazodone from 150 mg to 100 mg to see if that helps with her daytime sleepiness Could consider sleep study if she but would be willing to do this to rule out sleep apnea Update me in 1 month and let me know how she is doing  We will follow-up after labs with further recommendations and treatment plan Webb Silversmith, NP This visit occurred during the SARS-CoV-2 public health emergency.  Safety protocols were in place, including screening questions prior to the visit, additional usage of staff PPE, and extensive cleaning of exam room while observing appropriate contact time as indicated for disinfecting solutions.

## 2021-05-01 ENCOUNTER — Encounter: Payer: Self-pay | Admitting: Internal Medicine

## 2021-05-01 LAB — CBC WITH DIFFERENTIAL/PLATELET
Absolute Monocytes: 740 cells/uL (ref 200–950)
Basophils Absolute: 30 cells/uL (ref 0–200)
Basophils Relative: 0.4 %
Eosinophils Absolute: 170 cells/uL (ref 15–500)
Eosinophils Relative: 2.3 %
HCT: 35.4 % (ref 35.0–45.0)
Hemoglobin: 11.1 g/dL — ABNORMAL LOW (ref 11.7–15.5)
Lymphs Abs: 977 cells/uL (ref 850–3900)
MCH: 31.1 pg (ref 27.0–33.0)
MCHC: 31.4 g/dL — ABNORMAL LOW (ref 32.0–36.0)
MCV: 99.2 fL (ref 80.0–100.0)
MPV: 11.7 fL (ref 7.5–12.5)
Monocytes Relative: 10 %
Neutro Abs: 5483 cells/uL (ref 1500–7800)
Neutrophils Relative %: 74.1 %
Platelets: 184 10*3/uL (ref 140–400)
RBC: 3.57 10*6/uL — ABNORMAL LOW (ref 3.80–5.10)
RDW: 13.8 % (ref 11.0–15.0)
Total Lymphocyte: 13.2 %
WBC: 7.4 10*3/uL (ref 3.8–10.8)

## 2021-05-01 LAB — VITAMIN B12: Vitamin B-12: 1380 pg/mL — ABNORMAL HIGH (ref 200–1100)

## 2021-05-01 LAB — VITAMIN D 25 HYDROXY (VIT D DEFICIENCY, FRACTURES): Vit D, 25-Hydroxy: 52 ng/mL (ref 30–100)

## 2021-05-01 NOTE — Patient Instructions (Signed)
Weakness Weakness is a lack of strength. You may feel weak all over your body (generalized), or you may feel weak in one part of your body (focal). There are many potential causes of weakness. Sometimes, the cause of your weakness may not be known. Some causes of weakness can be serious, so it isimportant to see your doctor. Follow these instructions at home: Activity Rest as needed. Try to get enough sleep. Most adults need 7-8 hours of sleep each night. Talk to your doctor about how much sleep you need each night. Do exercises, such as arm curls and leg raises, for 30 minutes at least 2 days a week or as told by your doctor. Think about working with a physical therapist or trainer to help you get stronger. General instructions  Take over-the-counter and prescription medicines only as told by your doctor. Eat a healthy, well-balanced diet. This includes: Proteins to build muscles, such as lean meats and fish. Fresh fruits and vegetables. Carbohydrates to boost energy, such as whole grains. Drink enough fluid to keep your pee (urine) pale yellow. Keep all follow-up visits as told by your doctor. This is important.  Contact a doctor if: Your weakness does not get better or it gets worse. Your weakness affects your ability to: Think clearly. Do your normal daily activities. Get help right away if you: Have sudden weakness on one side of your face or body. Have chest pain. Have trouble breathing or shortness of breath. Have problems with your vision. Have trouble talking or swallowing. Have trouble standing or walking. Are light-headed. Pass out (lose consciousness). Summary Weakness is a lack of strength. You may feel weak all over your body or just in one part of your body. There are many potential causes of weakness. Sometimes, the cause of your weakness may not be known. Rest as needed, and try to get enough sleep. Most adults need 7-8 hours of sleep each night. Eat a healthy,  well-balanced diet. This information is not intended to replace advice given to you by your health care provider. Make sure you discuss any questions you have with your healthcare provider. Document Revised: 02/09/2018 Document Reviewed: 02/09/2018 Elsevier Patient Education  2022 [employer] Inc.  

## 2021-05-03 ENCOUNTER — Other Ambulatory Visit: Payer: Self-pay | Admitting: Internal Medicine

## 2021-05-03 NOTE — Telephone Encounter (Signed)
Requested Prescriptions  Pending Prescriptions Disp Refills  . pantoprazole (PROTONIX) 40 MG tablet [Pharmacy Med Name: Pantoprazole Sodium 40 MG Oral Tablet Delayed Release] 30 tablet 0    Sig: Take 1 tablet by mouth once daily     Gastroenterology: Proton Pump Inhibitors Passed - 05/03/2021 12:33 PM      Passed - Valid encounter within last 12 months    Recent Outpatient Visits          3 days ago Weakness   [facility] [place], [place] W, NP   2 months ago Iron deficiency anemia due to chronic blood loss   [place], Coralie Keens, NP   3 months ago Symptomatic anemia   [facility] [place], Coralie Keens, [place]

## 2021-05-31 ENCOUNTER — Other Ambulatory Visit: Payer: Self-pay | Admitting: Internal Medicine

## 2021-05-31 NOTE — Telephone Encounter (Signed)
Requested Prescriptions  Pending Prescriptions Disp Refills  . pantoprazole (PROTONIX) 40 MG tablet [Pharmacy Med Name: Pantoprazole Sodium 40 MG Oral Tablet Delayed Release] 30 tablet 0    Sig: Take 1 tablet by mouth once daily     Gastroenterology: Proton Pump Inhibitors Passed - 05/31/2021 10:23 AM      Passed - Valid encounter within last 12 months    Recent Outpatient Visits          1 month ago Weakness   [facility] [place], [place] W, NP   3 months ago Iron deficiency anemia due to chronic blood loss   [place], Coralie Keens, NP   4 months ago Symptomatic anemia   [facility] [place], Coralie Keens, [place]

## 2021-06-04 ENCOUNTER — Encounter (HOSPITAL_COMMUNITY): Payer: Self-pay

## 2021-06-04 ENCOUNTER — Emergency Department (HOSPITAL_COMMUNITY): Payer: Medicare Other

## 2021-06-04 ENCOUNTER — Inpatient Hospital Stay (HOSPITAL_COMMUNITY)
Admission: EM | Admit: 2021-06-04 | Discharge: 2021-06-13 | DRG: 682 | Disposition: A | Discharge status: Skilled Nursing Facility | Payer: Medicare Other | Attending: Obstetrics and Gynecology | Admitting: Obstetrics and Gynecology

## 2021-06-04 DIAGNOSIS — R531 Weakness: Secondary | ICD-10-CM | POA: Diagnosis not present

## 2021-06-04 DIAGNOSIS — N179 Acute kidney failure, unspecified: Secondary | ICD-10-CM | POA: Diagnosis not present

## 2021-06-04 DIAGNOSIS — J1282 Pneumonia due to coronavirus disease 2019: Secondary | ICD-10-CM | POA: Diagnosis present

## 2021-06-04 DIAGNOSIS — Z6841 Body Mass Index (BMI) 40.0 and over, adult: Secondary | ICD-10-CM | POA: Diagnosis not present

## 2021-06-04 DIAGNOSIS — R338 Other retention of urine: Secondary | ICD-10-CM

## 2021-06-04 DIAGNOSIS — M1990 Unspecified osteoarthritis, unspecified site: Secondary | ICD-10-CM | POA: Diagnosis not present

## 2021-06-04 DIAGNOSIS — E860 Dehydration: Secondary | ICD-10-CM | POA: Diagnosis present

## 2021-06-04 DIAGNOSIS — F0393 Unspecified dementia, unspecified severity, with mood disturbance: Secondary | ICD-10-CM | POA: Diagnosis present

## 2021-06-04 DIAGNOSIS — G9341 Metabolic encephalopathy: Secondary | ICD-10-CM | POA: Diagnosis present

## 2021-06-04 DIAGNOSIS — I129 Hypertensive chronic kidney disease with stage 1 through stage 4 chronic kidney disease, or unspecified chronic kidney disease: Secondary | ICD-10-CM | POA: Diagnosis present

## 2021-06-04 DIAGNOSIS — R404 Transient alteration of awareness: Secondary | ICD-10-CM | POA: Diagnosis not present

## 2021-06-04 DIAGNOSIS — D631 Anemia in chronic kidney disease: Secondary | ICD-10-CM | POA: Diagnosis present

## 2021-06-04 DIAGNOSIS — M6282 Rhabdomyolysis: Secondary | ICD-10-CM | POA: Diagnosis present

## 2021-06-04 DIAGNOSIS — F419 Anxiety disorder, unspecified: Secondary | ICD-10-CM | POA: Diagnosis present

## 2021-06-04 DIAGNOSIS — M6281 Muscle weakness (generalized): Secondary | ICD-10-CM | POA: Diagnosis not present

## 2021-06-04 DIAGNOSIS — R5381 Other malaise: Secondary | ICD-10-CM | POA: Diagnosis present

## 2021-06-04 DIAGNOSIS — I10 Essential (primary) hypertension: Secondary | ICD-10-CM | POA: Diagnosis not present

## 2021-06-04 DIAGNOSIS — I69398 Other sequelae of cerebral infarction: Secondary | ICD-10-CM | POA: Diagnosis not present

## 2021-06-04 DIAGNOSIS — R7303 Prediabetes: Secondary | ICD-10-CM | POA: Diagnosis present

## 2021-06-04 DIAGNOSIS — Z87440 Personal history of urinary (tract) infections: Secondary | ICD-10-CM

## 2021-06-04 DIAGNOSIS — Z66 Do not resuscitate: Secondary | ICD-10-CM | POA: Diagnosis present

## 2021-06-04 DIAGNOSIS — R1312 Dysphagia, oropharyngeal phase: Secondary | ICD-10-CM | POA: Diagnosis not present

## 2021-06-04 DIAGNOSIS — R159 Full incontinence of feces: Secondary | ICD-10-CM | POA: Diagnosis present

## 2021-06-04 DIAGNOSIS — U071 COVID-19: Secondary | ICD-10-CM | POA: Diagnosis present

## 2021-06-04 DIAGNOSIS — Z515 Encounter for palliative care: Secondary | ICD-10-CM | POA: Diagnosis not present

## 2021-06-04 DIAGNOSIS — Z85828 Personal history of other malignant neoplasm of skin: Secondary | ICD-10-CM | POA: Diagnosis not present

## 2021-06-04 DIAGNOSIS — R059 Cough, unspecified: Secondary | ICD-10-CM | POA: Diagnosis not present

## 2021-06-04 DIAGNOSIS — E875 Hyperkalemia: Secondary | ICD-10-CM | POA: Diagnosis present

## 2021-06-04 DIAGNOSIS — Z7401 Bed confinement status: Secondary | ICD-10-CM | POA: Diagnosis not present

## 2021-06-04 DIAGNOSIS — Z7902 Long term (current) use of antithrombotics/antiplatelets: Secondary | ICD-10-CM

## 2021-06-04 DIAGNOSIS — R0902 Hypoxemia: Secondary | ICD-10-CM | POA: Diagnosis not present

## 2021-06-04 DIAGNOSIS — I959 Hypotension, unspecified: Secondary | ICD-10-CM | POA: Diagnosis not present

## 2021-06-04 DIAGNOSIS — E785 Hyperlipidemia, unspecified: Secondary | ICD-10-CM | POA: Diagnosis present

## 2021-06-04 DIAGNOSIS — N1832 Chronic kidney disease, stage 3b: Secondary | ICD-10-CM | POA: Diagnosis not present

## 2021-06-04 DIAGNOSIS — R339 Retention of urine, unspecified: Secondary | ICD-10-CM | POA: Diagnosis present

## 2021-06-04 DIAGNOSIS — D509 Iron deficiency anemia, unspecified: Secondary | ICD-10-CM | POA: Diagnosis present

## 2021-06-04 DIAGNOSIS — Z885 Allergy status to narcotic agent status: Secondary | ICD-10-CM

## 2021-06-04 DIAGNOSIS — F0394 Unspecified dementia, unspecified severity, with anxiety: Secondary | ICD-10-CM | POA: Diagnosis present

## 2021-06-04 DIAGNOSIS — R0602 Shortness of breath: Secondary | ICD-10-CM | POA: Diagnosis not present

## 2021-06-04 DIAGNOSIS — F32A Depression, unspecified: Secondary | ICD-10-CM | POA: Diagnosis not present

## 2021-06-04 DIAGNOSIS — R41841 Cognitive communication deficit: Secondary | ICD-10-CM | POA: Diagnosis not present

## 2021-06-04 DIAGNOSIS — Z79899 Other long term (current) drug therapy: Secondary | ICD-10-CM

## 2021-06-04 DIAGNOSIS — R519 Headache, unspecified: Secondary | ICD-10-CM | POA: Diagnosis not present

## 2021-06-04 DIAGNOSIS — I69891 Dysphagia following other cerebrovascular disease: Secondary | ICD-10-CM | POA: Diagnosis not present

## 2021-06-04 DIAGNOSIS — Z884 Allergy status to anesthetic agent status: Secondary | ICD-10-CM

## 2021-06-04 DIAGNOSIS — I69828 Other speech and language deficits following other cerebrovascular disease: Secondary | ICD-10-CM | POA: Diagnosis not present

## 2021-06-04 DIAGNOSIS — R32 Unspecified urinary incontinence: Secondary | ICD-10-CM | POA: Diagnosis not present

## 2021-06-04 DIAGNOSIS — Z8673 Personal history of transient ischemic attack (TIA), and cerebral infarction without residual deficits: Secondary | ICD-10-CM

## 2021-06-04 DIAGNOSIS — Z90722 Acquired absence of ovaries, bilateral: Secondary | ICD-10-CM

## 2021-06-04 DIAGNOSIS — E669 Obesity, unspecified: Secondary | ICD-10-CM | POA: Diagnosis present

## 2021-06-04 DIAGNOSIS — R2689 Other abnormalities of gait and mobility: Secondary | ICD-10-CM | POA: Diagnosis not present

## 2021-06-04 DIAGNOSIS — N323 Diverticulum of bladder: Secondary | ICD-10-CM | POA: Diagnosis not present

## 2021-06-04 DIAGNOSIS — R4182 Altered mental status, unspecified: Secondary | ICD-10-CM | POA: Diagnosis not present

## 2021-06-04 DIAGNOSIS — N183 Chronic kidney disease, stage 3 unspecified: Secondary | ICD-10-CM | POA: Diagnosis present

## 2021-06-04 DIAGNOSIS — R410 Disorientation, unspecified: Secondary | ICD-10-CM | POA: Diagnosis not present

## 2021-06-04 DIAGNOSIS — Z7189 Other specified counseling: Secondary | ICD-10-CM | POA: Diagnosis not present

## 2021-06-04 LAB — URINALYSIS, ROUTINE W REFLEX MICROSCOPIC
Bilirubin Urine: NEGATIVE
Glucose, UA: NEGATIVE mg/dL
Ketones, ur: NEGATIVE mg/dL
Leukocytes,Ua: NEGATIVE
Nitrite: NEGATIVE
Protein, ur: NEGATIVE mg/dL
Specific Gravity, Urine: 1.014 (ref 1.005–1.030)
pH: 5 (ref 5.0–8.0)

## 2021-06-04 LAB — CBC WITH DIFFERENTIAL/PLATELET
Abs Immature Granulocytes: 0.06 10*3/uL (ref 0.00–0.07)
Basophils Absolute: 0 10*3/uL (ref 0.0–0.1)
Basophils Relative: 0 %
Eosinophils Absolute: 0.1 10*3/uL (ref 0.0–0.5)
Eosinophils Relative: 3 %
HCT: 34.1 % — ABNORMAL LOW (ref 36.0–46.0)
Hemoglobin: 11 g/dL — ABNORMAL LOW (ref 12.0–15.0)
Immature Granulocytes: 1 %
Lymphocytes Relative: 18 %
Lymphs Abs: 0.8 10*3/uL (ref 0.7–4.0)
MCH: 31.7 pg (ref 26.0–34.0)
MCHC: 32.3 g/dL (ref 30.0–36.0)
MCV: 98.3 fL (ref 80.0–100.0)
Monocytes Absolute: 0.5 10*3/uL (ref 0.1–1.0)
Monocytes Relative: 10 %
Neutro Abs: 3.2 10*3/uL (ref 1.7–7.7)
Neutrophils Relative %: 68 %
Platelets: 209 10*3/uL (ref 150–400)
RBC: 3.47 MIL/uL — ABNORMAL LOW (ref 3.87–5.11)
RDW: 14.7 % (ref 11.5–15.5)
WBC: 4.7 10*3/uL (ref 4.0–10.5)
nRBC: 0 % (ref 0.0–0.2)

## 2021-06-04 LAB — COMPREHENSIVE METABOLIC PANEL
ALT: 14 U/L (ref 0–44)
AST: 17 U/L (ref 15–41)
Albumin: 3.8 g/dL (ref 3.5–5.0)
Alkaline Phosphatase: 78 U/L (ref 38–126)
Anion gap: 6 (ref 5–15)
BUN: 74 mg/dL — ABNORMAL HIGH (ref 8–23)
CO2: 25 mmol/L (ref 22–32)
Calcium: 9.1 mg/dL (ref 8.9–10.3)
Chloride: 106 mmol/L (ref 98–111)
Creatinine, Ser: 2.38 mg/dL — ABNORMAL HIGH (ref 0.44–1.00)
GFR, Estimated: 20 mL/min — ABNORMAL LOW (ref >60)
Glucose, Bld: 108 mg/dL — ABNORMAL HIGH (ref 70–99)
Potassium: 5.5 mmol/L — ABNORMAL HIGH (ref 3.5–5.1)
Sodium: 137 mmol/L (ref 135–145)
Total Bilirubin: 0.5 mg/dL (ref 0.3–1.2)
Total Protein: 7.4 g/dL (ref 6.5–8.1)

## 2021-06-04 LAB — RESP PANEL BY RT-PCR (FLU A&B, COVID) ARPGX2
Influenza A by PCR: NEGATIVE
Influenza B by PCR: NEGATIVE
SARS Coronavirus 2 by RT PCR: POSITIVE — AB

## 2021-06-04 LAB — CK: Total CK: 319 U/L — ABNORMAL HIGH (ref 38–234)

## 2021-06-04 LAB — ETHANOL: Alcohol, Ethyl (B): <10 mg/dL (ref <10)

## 2021-06-04 MED ORDER — ACETAMINOPHEN 325 MG PO TABS
650.0000 mg | ORAL_TABLET | Freq: Four times a day (QID) | ORAL | Status: DC | PRN
  Administered 2021-06-04 – 2021-06-13 (×3): 650 mg via ORAL
  Filled 2021-06-04 (×3): qty 2

## 2021-06-04 MED ORDER — SODIUM ZIRCONIUM CYCLOSILICATE 5 G PO PACK
5.0000 g | PACK | Freq: Once | ORAL | Status: AC
  Administered 2021-06-04: 5 g via ORAL
  Filled 2021-06-04: qty 1

## 2021-06-04 MED ORDER — HYDRALAZINE HCL 50 MG PO TABS
50.0000 mg | ORAL_TABLET | Freq: Four times a day (QID) | ORAL | Status: DC | PRN
  Administered 2021-06-05 – 2021-06-12 (×9): 50 mg via ORAL
  Filled 2021-06-04 (×9): qty 1

## 2021-06-04 MED ORDER — CLOPIDOGREL BISULFATE 75 MG PO TABS
75.0000 mg | ORAL_TABLET | Freq: Every evening | ORAL | Status: DC
  Administered 2021-06-04 – 2021-06-12 (×9): 75 mg via ORAL
  Filled 2021-06-04 (×11): qty 1

## 2021-06-04 MED ORDER — DILTIAZEM HCL ER COATED BEADS 180 MG PO CP24
180.0000 mg | ORAL_CAPSULE | Freq: Every day | ORAL | Status: DC
  Administered 2021-06-05 – 2021-06-13 (×9): 180 mg via ORAL
  Filled 2021-06-04 (×9): qty 1

## 2021-06-04 MED ORDER — ONDANSETRON HCL 4 MG PO TABS: 4.0000 mg | ORAL_TABLET | Freq: Four times a day (QID) | ORAL | Status: DC | PRN

## 2021-06-04 MED ORDER — VITAMIN B-12 500 MCG PO TABS: 500.0000 ug | ORAL_TABLET | Freq: Every day | ORAL | Status: DC

## 2021-06-04 MED ORDER — VITAMIN B-12 1000 MCG PO TABS
500.0000 ug | ORAL_TABLET | Freq: Every day | ORAL | Status: DC
  Administered 2021-06-05 – 2021-06-13 (×9): 500 ug via ORAL
  Filled 2021-06-04 (×9): qty 1

## 2021-06-04 MED ORDER — SODIUM CHLORIDE 0.9 % IV BOLUS
1000.0000 mL | Freq: Once | INTRAVENOUS | Status: AC
  Administered 2021-06-04: 1000 mL via INTRAVENOUS

## 2021-06-04 MED ORDER — LORATADINE 10 MG PO TABS
10.0000 mg | ORAL_TABLET | Freq: Every day | ORAL | Status: DC
  Administered 2021-06-05 – 2021-06-13 (×9): 10 mg via ORAL
  Filled 2021-06-04 (×9): qty 1

## 2021-06-04 MED ORDER — TRAZODONE HCL 100 MG PO TABS
100.0000 mg | ORAL_TABLET | Freq: Every day | ORAL | Status: DC
  Administered 2021-06-04 – 2021-06-12 (×9): 100 mg via ORAL
  Filled 2021-06-04 (×9): qty 1

## 2021-06-04 MED ORDER — ONDANSETRON HCL 4 MG/2ML IJ SOLN
4.0000 mg | Freq: Four times a day (QID) | INTRAMUSCULAR | Status: DC | PRN
  Administered 2021-06-13: 4 mg via INTRAVENOUS
  Filled 2021-06-04: qty 2

## 2021-06-04 MED ORDER — POTASSIUM CHLORIDE CRYS ER 20 MEQ PO TBCR
20.0000 meq | EXTENDED_RELEASE_TABLET | Freq: Every day | ORAL | Status: DC
  Administered 2021-06-05: 10:00:00 20 meq via ORAL
  Filled 2021-06-04: qty 1

## 2021-06-04 MED ORDER — HYDROCHLOROTHIAZIDE 12.5 MG PO CAPS: 12.5000 mg | ORAL_CAPSULE | Freq: Every day | ORAL | Status: DC

## 2021-06-04 MED ORDER — LACTATED RINGERS IV SOLN: INTRAVENOUS | Status: DC

## 2021-06-04 MED ORDER — POLYETHYLENE GLYCOL 3350 17 G PO PACK
17.0000 g | PACK | Freq: Every day | ORAL | Status: DC | PRN
  Administered 2021-06-12: 17 g via ORAL
  Filled 2021-06-04: qty 1

## 2021-06-04 MED ORDER — GABAPENTIN 300 MG PO CAPS
300.0000 mg | ORAL_CAPSULE | Freq: Two times a day (BID) | ORAL | Status: DC
  Administered 2021-06-04 – 2021-06-06 (×4): 300 mg via ORAL
  Filled 2021-06-04 (×4): qty 1

## 2021-06-04 MED ORDER — PANTOPRAZOLE SODIUM 40 MG PO TBEC
40.0000 mg | DELAYED_RELEASE_TABLET | Freq: Every day | ORAL | Status: DC
  Administered 2021-06-05 – 2021-06-13 (×9): 40 mg via ORAL
  Filled 2021-06-04 (×9): qty 1

## 2021-06-04 MED ORDER — ATORVASTATIN CALCIUM 40 MG PO TABS
80.0000 mg | ORAL_TABLET | Freq: Every day | ORAL | Status: DC
  Administered 2021-06-04 – 2021-06-13 (×10): 80 mg via ORAL
  Filled 2021-06-04 (×10): qty 2

## 2021-06-04 MED ORDER — LISINOPRIL 10 MG PO TABS: 5.0000 mg | ORAL_TABLET | Freq: Every day | ORAL | Status: DC

## 2021-06-04 MED ORDER — ACETAMINOPHEN 650 MG RE SUPP: 650.0000 mg | Freq: Four times a day (QID) | RECTAL | Status: DC | PRN

## 2021-06-04 MED ORDER — ESCITALOPRAM OXALATE 10 MG PO TABS
10.0000 mg | ORAL_TABLET | Freq: Every day | ORAL | Status: DC
  Administered 2021-06-05 – 2021-06-13 (×9): 10 mg via ORAL
  Filled 2021-06-04 (×9): qty 1

## 2021-06-04 MED ORDER — ENOXAPARIN SODIUM 30 MG/0.3ML IJ SOSY
30.0000 mg | PREFILLED_SYRINGE | Freq: Every day | INTRAMUSCULAR | Status: DC
  Administered 2021-06-04 – 2021-06-09 (×6): 30 mg via SUBCUTANEOUS
  Filled 2021-06-04 (×6): qty 0.3

## 2021-06-04 MED ORDER — BUPROPION HCL ER (SR) 100 MG PO TB12
100.0000 mg | ORAL_TABLET | Freq: Two times a day (BID) | ORAL | Status: DC
  Administered 2021-06-04 – 2021-06-13 (×18): 100 mg via ORAL
  Filled 2021-06-04 (×20): qty 1

## 2021-06-04 NOTE — ED Triage Notes (Signed)
Pt presents to the ED via EMS from home for confusion and hallucinations. Per EMS, the pt lives at home with her daughter and is "seeing people that aren't there and talking to people that are not there." Per EMS, this has been ongoing for two days. Per EMS, pt has a past hx of stroke, EMS unable to provide date of this, however, over the past three weeks the pt has been progressively weaker. Per EMS, the pt has a hx of  UTI in the past.

## 2021-06-04 NOTE — ED Notes (Signed)
Per EDP, pt to be moved from [place] to ED exam room with cardiac monitoring availability. Charge nurse Hanley Seamen, RN., made aware. Pt remains in ED hallway stretcher d/t lack of available rooms. Pt placed on traveling cardiac monitor, attached to [employer] and EKG being obtained by ED tech at this time.

## 2021-06-04 NOTE — ED Provider Notes (Addendum)
[facility] DEPT Provider Note   CSN: [id number] Arrival date & time: 06/04/21  1258     History Chief Complaint  Patient presents with   Altered Mental Status    Carol Page is a 85 y.o. female.  HPI She presents for evaluation of confusion, coming from home where she lives with her daughter.  I reached her daughter Neoma Laming by telephone at 1:40 PM.  Neoma Laming states that her mother has been "weak and not getting out of bed for 3 weeks."  Her mother has frequently refused to stand and when she did, laid right back down.  Family members got her wheelchair to use however she will not use it.  In the last 3 days, the patient has been confused and hallucinating, "talking to people that are not there."  Prior to that she was able to converse and communicate but now does not do that.  Patient's daughter feels that she is "giving up."  She has not previously had this problem.  There are no other known problems including fever, cough, complaints of pain.  Patient is incontinent of bowels and urine, and is wearing a diaper, because she will not get up and use a toilet.  There are no other known active modifying factors.    Past Medical History:  Diagnosis Date   Allergy    Arthritis    Cellulitis of left leg    Depression    History of blood transfusion    Hyperlipidemia    Hypertension    Pre-diabetes    Proctitis    Skin cancer of nose    Stroke [facility])    Ulcer    Urinary incontinence     Patient Active Problem List   Diagnosis Date Noted   AKI (acute kidney injury) ([place]) 06/04/2021   CKD (chronic kidney disease) stage 4, GFR 15-29 ml/min (HCC) 03/26/2021   Iron deficiency anemia 02/18/2021   Morbid obesity ([place]) 01/17/2021   Aortic atherosclerosis ([place]) 01/17/2021   CKD (chronic kidney disease) stage 3, GFR 30-59 ml/min (HCC) 01/08/2021   Obesity, Class III, BMI 40-49.9 (morbid obesity) ([place]) 01/08/2021   Anxiety and depression 10/06/2013    Urinary incontinence 10/06/2013   Chronic leg pain 10/06/2013   History of CVA (cerebrovascular accident) 10/06/2013   Essential hypertension 11/09/2012   HLD (hyperlipidemia) 11/09/2012   Pre-diabetes 11/09/2012   Seizure disorder ([place]) 11/09/2012    Past Surgical History:  Procedure Laterality Date   BILATERAL OOPHORECTOMY  06/2012   ovarian cysts   BIOPSY  01/10/2021   Procedure: BIOPSY;  Surgeon: Gatha Mayer, MD;  Location: [place];  Service: Endoscopy;;   COLONOSCOPY WITH PROPOFOL N/A 01/10/2021   Procedure: COLONOSCOPY WITH PROPOFOL;  Surgeon: Gatha Mayer, MD;  Location: [facility] ENDOSCOPY;  Service: Endoscopy;  Laterality: N/A;   ESOPHAGOGASTRODUODENOSCOPY (EGD) WITH PROPOFOL N/A 01/10/2021   Procedure: ESOPHAGOGASTRODUODENOSCOPY (EGD) WITH PROPOFOL;  Surgeon: Gatha Mayer, MD;  Location: [facility];  Service: Endoscopy;  Laterality: N/A;   SKIN CANCER EXCISION  2007   reomved from [place]       OB History   No obstetric history on file.     Family History  Problem Relation Age of Onset   Heart disease Other        Parent   Cancer Maternal Uncle     Social History   Tobacco Use   Smoking status: Never   Smokeless tobacco: Never  Substance Use Topics   Alcohol use: No   Drug use: No    Home Medications Prior to Admission medications   Medication Sig Start Date End Date Taking? Authorizing Provider  atorvastatin (LIPITOR) 80 MG tablet Take 1 tablet by mouth once daily Patient taking differently: Take 80 mg by mouth daily. 09/24/20   Jearld Fenton, NP  buPROPion ER (WELLBUTRIN SR) 100 MG 12 hr tablet Take 1 tablet (100 mg total) by mouth 2 (two) times daily. 04/30/21   Jearld Fenton, NP  CALCIUM PO Take 1 tablet by mouth daily.    [provider]  cholecalciferol (VITAMIN D) 1000 units tablet Take 1,000 Units by mouth daily.    [provider]  clopidogrel (PLAVIX) 75 MG tablet Take 1 tablet by  mouth in the evening 03/26/21   Jearld Fenton, NP  diltiazem (CARDIZEM CD) 180 MG 24 hr capsule Take 1 capsule by mouth once daily Patient taking differently: Take 180 mg by mouth daily. 10/11/20   Jearld Fenton, NP  escitalopram (LEXAPRO) 10 MG tablet Take 1 tablet (10 mg total) by mouth daily. 03/26/21   Jearld Fenton, NP  gabapentin (NEURONTIN) 300 MG capsule Take 1 capsule by mouth twice daily Patient taking differently: Take 300 mg by mouth 2 (two) times daily. 09/24/20   Jearld Fenton, NP  hydrochlorothiazide (MICROZIDE) 12.5 MG capsule Take 1 capsule by mouth once daily Patient taking differently: Take 12.5 mg by mouth daily. 09/24/20   Jearld Fenton, NP  Krill Oil 500 MG CAPS Take 500 mg by mouth in the morning and at bedtime.    [provider]  lisinopril (ZESTRIL) 5 MG tablet Take 1 tablet (5 mg total) by mouth daily. 02/18/21   Jearld Fenton, NP  loratadine (CLARITIN) 10 MG tablet Take 10 mg by mouth daily.    [provider]  pantoprazole (PROTONIX) 40 MG tablet Take 1 tablet by mouth once daily 05/31/21   Jearld Fenton, NP  polyethylene glycol (MIRALAX / GLYCOLAX) 17 g packet Take 17 g by mouth daily as needed for mild constipation. 01/11/21   Kathie Dike, MD  potassium chloride SA (KLOR-CON) 20 MEQ tablet TAKE 1  BY MOUTH ONCE DAILY 03/18/21   Jearld Fenton, NP  traZODone (DESYREL) 50 MG tablet Take 2 tablets (100 mg total) by mouth at bedtime. 04/30/21   Jearld Fenton, NP  vitamin B-12 (CYANOCOBALAMIN) 1000 MCG tablet Take 1,000 mcg by mouth daily.    [provider]    Allergies    Codeine and Ether  Review of Systems   Review of Systems  Unable to perform ROS: Mental status change   Physical Exam Updated Vital Signs BP (!) 176/107 (BP Location: Left Arm)   Pulse 62   Temp 97.6 F (36.4 C) (Oral)   Resp 18   SpO2 97%   Physical Exam Vitals and nursing note reviewed.  Constitutional:      Appearance: She is well-developed. She  is obese. She is not ill-appearing.  HENT:     Head: Normocephalic and atraumatic.     Right Ear: External ear normal.     Left Ear: External ear normal.     Nose: No congestion or rhinorrhea.     Mouth/Throat:     Pharynx: No oropharyngeal exudate.  Eyes:     Conjunctiva/sclera: Conjunctivae normal.     Pupils: Pupils are equal, round, and reactive to light.  Neck:  Trachea: Phonation normal.  Cardiovascular:     Rate and Rhythm: Normal rate and regular rhythm.     Heart sounds: Normal heart sounds.  Pulmonary:     Effort: Pulmonary effort is normal.     Breath sounds: Normal breath sounds.  Abdominal:     General: There is no distension.     Palpations: Abdomen is soft.     Tenderness: There is no abdominal tenderness.  Musculoskeletal:        General: Swelling present. No tenderness. Normal range of motion.     Cervical back: Normal range of motion and neck supple.     Comments: She moves arms and legs minimally at request.  There is no asymmetry of tone.  Skin:    General: Skin is warm and dry.     Coloration: Skin is not jaundiced or pale.  Neurological:     General: No focal deficit present.     Mental Status: She is alert.     Cranial Nerves: No cranial nerve deficit.     Sensory: No sensory deficit.     Motor: No abnormal muscle tone.     Coordination: Coordination normal.     Comments: Disoriented.  Knows only that she lives in [place].  Psychiatric:        Attention and Perception: She is inattentive.        Mood and Affect: Mood is depressed.        Speech: She is communicative.        Behavior: Behavior is slowed. Behavior is not agitated, aggressive or hyperactive.        Cognition and Memory: Cognition is impaired.        Judgment: Judgment is inappropriate.    ED Results / Procedures / Treatments   Labs (all labs ordered are listed, but only abnormal results are displayed) Labs Reviewed  COMPREHENSIVE METABOLIC PANEL - Abnormal; Notable for  the following components:      Result Value   Potassium 5.5 (*)    Glucose, Bld 108 (*)    BUN 74 (*)    Creatinine, Ser 2.38 (*)    GFR, Estimated 20 (*)    All other components within normal limits  CBC WITH DIFFERENTIAL/PLATELET - Abnormal; Notable for the following components:   RBC 3.47 (*)    Hemoglobin 11.0 (*)    HCT 34.1 (*)    All other components within normal limits  URINALYSIS, ROUTINE W REFLEX MICROSCOPIC - Abnormal; Notable for the following components:   Hgb urine dipstick MODERATE (*)    Bacteria, UA RARE (*)    All other components within normal limits  RESP PANEL BY RT-PCR (FLU A&B, COVID) ARPGX2  ETHANOL    EKG None  Radiology CT Head Wo Contrast  Result Date: 06/04/2021 CLINICAL DATA:  Mental status change of unknown etiology. EXAM: CT HEAD WITHOUT CONTRAST TECHNIQUE: Contiguous axial images were obtained from the base of the skull through the vertex without intravenous contrast. COMPARISON:  04/21/2018 FINDINGS: Brain: No evidence of acute infarction, hemorrhage, hydrocephalus, extra-axial collection or mass lesion/mass effect. There is diffuse low-attenuation within the subcortical and periventricular white matter compatible with chronic microvascular disease. Vascular: No hyperdense vessel or unexpected calcification. Skull: Normal. Negative for fracture or focal lesion. Sinuses/Orbits: No acute finding. Other: None. IMPRESSION: 1. No acute intracranial abnormalities. 2. Chronic small vessel ischemic change. Electronically Signed   By: Kerby Moors M.D.   On: 06/04/2021 14:47    Procedures .Critical  Care Performed by: Daleen Bo, MD Authorized by: Daleen Bo, MD   Critical care provider statement:    Critical care time (minutes):  35   Critical care start time:  06/04/2021 1:20 PM   Critical care end time:  06/04/2021 3:11 PM   Critical care time was exclusive of:  Separately billable procedures and treating other patients   Critical care was  necessary to treat or prevent imminent or life-threatening deterioration of the following conditions:  Metabolic crisis   Critical care was time spent personally by me on the following activities:  Blood draw for specimens, development of treatment plan with patient or surrogate, discussions with consultants, evaluation of patient's response to treatment, examination of patient, ordering and performing treatments and interventions, ordering and review of laboratory studies, ordering and review of radiographic studies, pulse oximetry, re-evaluation of patient's condition and review of old charts   Medications Ordered in ED Medications  sodium zirconium cyclosilicate (LOKELMA) packet 5 g (has no administration in time range)  enoxaparin (LOVENOX) injection 30 mg (has no administration in time range)  acetaminophen (TYLENOL) tablet 650 mg (has no administration in time range)    Or  acetaminophen (TYLENOL) suppository 650 mg (has no administration in time range)  ondansetron (ZOFRAN) tablet 4 mg (has no administration in time range)    Or  ondansetron (ZOFRAN) injection 4 mg (has no administration in time range)  lactated ringers infusion (has no administration in time range)  sodium chloride 0.9 % bolus 1,000 mL (1,000 mLs Intravenous New Bag/Given 06/04/21 1503)    ED Course  I have reviewed the triage vital signs and the nursing notes.  Pertinent labs & imaging results that were available during my care of the patient were reviewed by me and considered in my medical decision making (see chart for details).  Clinical Course as of 06/04/21 1513  Wed Jun 04, 2021  1458 I informed the patient's daughter that she is being admitted.  Patient's daughter states that she is recovering from [place], currently. [EW]    Clinical Course User Index [EW] Daleen Bo, MD   MDM Rules/Calculators/A&P                            Patient Vitals for the past 24 hrs:  BP Temp Temp src Pulse Resp SpO2   06/04/21 1437 (!) 176/107 -- -- 62 18 97 %  06/04/21 1308 (!) 156/97 97.6 F (36.4 C) Oral 63 18 98 %  06/04/21 1303 -- -- -- -- -- 98 %    3:00 PM Reevaluation with update and discussion. After initial assessment and treatment, an updated evaluation reveals no change in clinical status. Daleen Bo   Medical Decision Making:  This patient is presenting for evaluation of malaise, weakness, confusion and hallucination, which does require a range of treatment options, and is a complaint that involves a high risk of morbidity and mortality. The differential diagnoses include acute infection, metabolic disorder, depression, psychosis, end-of-life. I decided to review old records, and in summary elderly female, not thriving well at home, requiring increasing assistance by family members.  She was sent here because they were concerned and needed to have her assessed.  She has a history of stroke, remotely with only short-term memory resulting from that.  Also history of hypertension, GAVE disorder causing intestinal bleeding, hypercholesterolemia.  She is DNR..  I obtained additional historical information from daughter Neoma Laming both telephone.  Clinical Laboratory Tests  Ordered, included CBC, Metabolic panel, Urinalysis, and alcohol level, viral panel . Review indicates normal except potassium high, glucose high, BUN high, creatinine high, GFR low, hemoglobin low. Radiologic Tests Ordered, included CT head.  I independently Visualized: Radiographic images, which show no acute abnormalities     Critical Interventions-clinical evaluation, laboratory testing, discussion with daughter, CT imaging, IV fluids, observation, reassessment  After These Interventions, the Patient was reevaluated and was found with progressive illness, both medical and psychiatric, and new evidence for acute kidney injury, with hyperkalemia.  Suspect depression involved in process with decreased oral intake as a secondary  issue.  CRITICAL CARE-yes Performed by: Daleen Bo  Nursing Notes Reviewed/ Care Coordinated Applicable Imaging Reviewed Interpretation of Laboratory Data incorporated into ED treatment  3:02 PM-Consult complete with hospitalist. Patient case explained and discussed.  He agrees to admit patient for further evaluation and treatment. Call ended at 3:10 PM   Final Clinical Impression(s) / ED Diagnoses Final diagnoses:  AKI (acute kidney injury) ([place])  Weakness  Depression, unspecified depression type  Malaise    Rx / DC Orders ED Discharge Orders     None        Daleen Bo, MD 06/04/21 1511    Daleen Bo, MD 06/04/21 1515

## 2021-06-04 NOTE — ED Notes (Signed)
Call received from pt daughter [name] [phone] requesting rtn call for pt status/updates. [employer]

## 2021-06-04 NOTE — H&P (Signed)
History and Physical    Carol Page IAX:[id number] DOB: [birth year] DOA: 06/04/2021  PCP: Jearld Fenton, NP  Patient coming from: Home.  I have personally briefly reviewed patient's old medical records in [place]  Chief Complaint: Confusion and hallucinations.  HPI: Carol Page is a 85 y.o. female with medical history significant of seasonal allergies, osteoarthritis, cellulitis of the left leg, depression, hyperlipidemia, hypertension, prediabetes, proctitis, nasal skin cancer, history of other non-hemorrhagic stroke, stage 3b CKD, urinary incontinence who is coming to the emergency department via EMS from home due to confusion and hallucinations for the past 2 to 3 days.  The patient's daughter also stated that he has been progressively weaker for the past 3 weeks.  He has a history of UTIs in the past.  She has not had much oral intake for the past 3 days.  She is able to answer simple questions and denied headache, sore throat, back, chest or abdominal pain.  She was diagnosed with COVID-19 last week.  ED Course: Initial vital signs temperature 97.6 F, pulse 63, respiration 18, BP 156/97 mmHg O2 sat 98% on room air.  The patient received 1000 mL of NS bolus and 5 g of Lokelma p.o.  Lab work: Urinalysis showed moderate hemoglobinuria and rare bacteria on microscopic examination.  CBC showed white count of 4.7, hemoglobin 11.0 g/dL and platelets 209.  CMP showed potassium of 5.5 minimal/L.  The rest of the electrolytes were normal.  Glucose 180, BUN 74 and creatinine 2.38 mg/dL.  Imaging: CT head without contrast did not show any acute abnormality.  There was chronic small vessel disease.  Please see images and full radiology report for further details.  Review of Systems: As per HPI otherwise all other systems reviewed and are negative.  Past Medical History:  Diagnosis Date   Allergy    Arthritis    Cellulitis of left leg    Depression    History of blood transfusion     Hyperlipidemia    Hypertension    Pre-diabetes    Proctitis    Skin cancer of nose    Stroke [facility])    Ulcer    Urinary incontinence    Past Surgical History:  Procedure Laterality Date   BILATERAL OOPHORECTOMY  06/2012   ovarian cysts   BIOPSY  01/10/2021   Procedure: BIOPSY;  Surgeon: Gatha Mayer, MD;  Location: [facility];  Service: Endoscopy;;   COLONOSCOPY WITH PROPOFOL N/A 01/10/2021   Procedure: COLONOSCOPY WITH PROPOFOL;  Surgeon: Gatha Mayer, MD;  Location: [facility] ENDOSCOPY;  Service: Endoscopy;  Laterality: N/A;   ESOPHAGOGASTRODUODENOSCOPY (EGD) WITH PROPOFOL N/A 01/10/2021   Procedure: ESOPHAGOGASTRODUODENOSCOPY (EGD) WITH PROPOFOL;  Surgeon: Gatha Mayer, MD;  Location: [facility];  Service: Endoscopy;  Laterality: N/A;   SKIN CANCER EXCISION  2007   reomved from [place] History  reports that she has never smoked. She has never used smokeless tobacco. She reports that she does not drink alcohol and does not use drugs.  Allergies  Allergen Reactions   Codeine Nausea And Vomiting   Ether Nausea And Vomiting   Family History  Problem Relation Age of Onset   Heart disease Other        Parent   Cancer Maternal Uncle    Prior to Admission medications   Medication Sig Start Date End Date Taking? Authorizing Provider  atorvastatin (LIPITOR) 80 MG tablet Take  1 tablet by mouth once daily Patient taking differently: Take 80 mg by mouth every evening. 09/24/20  Yes Jearld Fenton, NP  buPROPion ER [facility] SR) 100 MG 12 hr tablet Take 1 tablet (100 mg total) by mouth 2 (two) times daily. Patient taking differently: Take 100 mg by mouth in the morning and at bedtime. 04/30/21  Yes Baity, Coralie Keens, NP  CALCIUM PO Take 1 tablet by mouth daily.   Yes [provider]  Cholecalciferol (VITAMIN D-3) 25 MCG (1000 UT) CAPS Take 1,000 Units by mouth daily.   Yes [provider]  clopidogrel (PLAVIX) 75  MG tablet Take 1 tablet by mouth in the evening Patient taking differently: Take 75 mg by mouth every evening. 03/26/21  Yes Baity, Coralie Keens, NP  diltiazem (CARDIZEM CD) 180 MG 24 hr capsule Take 1 capsule by mouth once daily Patient taking differently: Take 180 mg by mouth daily. 10/11/20  Yes Jearld Fenton, NP  escitalopram (LEXAPRO) 10 MG tablet Take 1 tablet (10 mg total) by mouth daily. 03/26/21  Yes Jearld Fenton, NP  ferrous sulfate 324 (65 Fe) MG TBEC Take 324 mg by mouth See admin instructions. Take 324 mg (65 Fe) by mouth in the morning with breakfast on Monday, Wednesday, and Friday only   Yes [provider]  gabapentin (NEURONTIN) 300 MG capsule Take 1 capsule by mouth twice daily Patient taking differently: Take 300 mg by mouth in the morning and at bedtime. 09/24/20  Yes Baity, Coralie Keens, NP  hydrochlorothiazide (MICROZIDE) 12.5 MG capsule Take 1 capsule by mouth once daily Patient taking differently: Take 12.5 mg by mouth in the morning. 09/24/20  Yes Baity, Coralie Keens, NP  Krill Oil 500 MG CAPS Take 500 mg by mouth in the morning and at bedtime.   Yes [provider]  lisinopril (ZESTRIL) 5 MG tablet Take 1 tablet (5 mg total) by mouth daily. 02/18/21  Yes Jearld Fenton, NP  loratadine (CLARITIN) 10 MG tablet Take 10 mg by mouth daily.   Yes [provider]  pantoprazole (PROTONIX) 40 MG tablet Take 1 tablet by mouth once daily Patient taking differently: Take 40 mg by mouth daily before breakfast. 05/31/21  Yes Baity, Coralie Keens, NP  polyethylene glycol (MIRALAX / GLYCOLAX) 17 g packet Take 17 g by mouth daily as needed for mild constipation. Patient taking differently: Take 17 g by mouth daily as needed for mild constipation (MIX AS DIRECTED AND DRINK). 01/11/21  Yes Kathie Dike, MD  potassium chloride SA (KLOR-CON) 20 MEQ tablet TAKE 1  BY MOUTH ONCE DAILY Patient taking differently: Take 20 mEq by mouth in the morning. 03/18/21  Yes Jearld Fenton, NP   traZODone (DESYREL) 50 MG tablet Take 2 tablets (100 mg total) by mouth at bedtime. 04/30/21  Yes Baity, Coralie Keens, NP  vitamin B-12 (CYANOCOBALAMIN) 500 MCG tablet Take 500 mcg by mouth daily.   Yes [provider]   Physical Exam: Vitals:   06/04/21 1303 06/04/21 1308 06/04/21 1437  BP:  (!) 156/97 (!) 176/107  Pulse:  63 62  Resp:  18 18  Temp:  97.6 F (36.4 C)   TempSrc:  Oral   SpO2: 98% 98% 97%   Constitutional: Chronically ill-appearing.  NAD, calm, comfortable Eyes: PERRL, lids and conjunctivae normal ENMT: Mucous membranes and lips are dry.  Posterior pharynx clear of any exudate or lesions. Neck: normal, supple, no masses, no thyromegaly Respiratory: Clear to auscultation bilaterally, no  wheezing, no crackles. Normal respiratory effort. No accessory muscle use.  Cardiovascular: Regular rate and rhythm, no murmurs / rubs / gallops. No extremity edema. 2+ pedal pulses. No carotid bruits.  Abdomen: No distention.  Bowel sounds positive.  Soft, no tenderness, no masses palpated. No hepatosplenomegaly.  Musculoskeletal: Moderate generalized weakness.  No clubbing / cyanosis. Good ROM, no contractures. Normal muscle tone.  Skin: Decreased skin turgor.  No acute rashes, lesions, ulcers on limited dermatological examination. Neurologic: CN 2-12 grossly intact. Sensation intact, DTR normal.  Psychiatric: Alert, oriented x1.  She knows she is in a hospital but is confused as to which hospital she is in.  She knew it was 2022 but nothing else.  She was unable to elaborate to why she is in the hospital.  Labs on Admission: I have personally reviewed following labs and imaging studies  CBC: Recent Labs  Lab 06/04/21 1325  WBC 4.7  NEUTROABS 3.2  HGB 11.0*  HCT 34.1*  MCV 98.3  PLT 195   Basic Metabolic Panel: Recent Labs  Lab 06/04/21 1325  NA 137  K 5.5*  CL 106  CO2 25  GLUCOSE 108*  BUN 74*  CREATININE 2.38*  CALCIUM 9.1   GFR: CrCl cannot be calculated  (Unknown ideal weight.).  Liver Function Tests: Recent Labs  Lab 06/04/21 1325  AST 17  ALT 14  ALKPHOS 78  BILITOT 0.5  PROT 7.4  ALBUMIN 3.8    Urine analysis:    Component Value Date/Time   COLORURINE YELLOW 06/04/2021 1325   APPEARANCEUR CLEAR 06/04/2021 1325   LABSPEC 1.014 06/04/2021 1325   PHURINE 5.0 06/04/2021 1325   GLUCOSEU NEGATIVE 06/04/2021 1325        HGBUR MODERATE (A) 06/04/2021 1325   BILIRUBINUR NEGATIVE 06/04/2021 [place] 06/04/2021 1325   PROTEINUR NEGATIVE 06/04/2021 1325        NITRITE NEGATIVE 06/04/2021 1325   [place] 06/04/2021 1325    Radiological Exams on Admission: CT Head Wo Contrast  Result Date: 06/04/2021 CLINICAL DATA:  Mental status change of unknown etiology. EXAM: CT HEAD WITHOUT CONTRAST TECHNIQUE: Contiguous axial images were obtained from the base of the skull through the vertex without intravenous contrast. COMPARISON:  04/21/2018 FINDINGS: Brain: No evidence of acute infarction, hemorrhage, hydrocephalus, extra-axial collection or mass lesion/mass effect. There is diffuse low-attenuation within the subcortical and periventricular white matter compatible with chronic microvascular disease. Vascular: No hyperdense vessel or unexpected calcification. Skull: Normal. Negative for fracture or focal lesion. Sinuses/Orbits: No acute finding. Other: None. IMPRESSION: 1. No acute intracranial abnormalities. 2. Chronic small vessel ischemic change. Electronically Signed   By: Kerby Moors M.D.   On: 06/04/2021 14:47    EKG: Independently reviewed.  Vent. rate 67 BPM PR interval 156 ms QRS duration 88 ms QT/QTcB 450/475 ms P-R-T axes 93 42 77 Sinus rhythm with occasional PVCs Nonspecific ST and T wave abnormality Abnormal ECG  Assessment/Plan Principal Problem:   AKI (acute kidney injury) ([place]) Superimposed on   stage 3, GFR 30-59 ml/min (HCC)   CKD (chronic kidney  disease) Observation/telemetry. Continue IV fluids. Monitor intake and output. Avoid hypotension. Avoid nephrotoxins. Follow-up renal function electrolytes.  Active Problems:   Hyperkalemia Received Lokelma 5 g earlier. Continue IV fluids Follow-up potassium level.    Essential hypertension Diltiazem 180 mg p.o. daily. Holding diuretic and ACE inhibitor. Hydralazine 50 mg q 6hr PRN. Monitor BP, HR, renal function electrolytes.    HLD (hyperlipidemia) Continue atorvastatin 80 mg  p.o. daily.    Pre-diabetes CBG monitoring every 6 hours.    Anxiety and depression Continue bupropion ER 100 mg p.o. BID. Continue escitalopram 10 mg p.o. daily.   Continue trazodone 10 mg p.o. bedtime.    Iron deficiency anemia Monitor hematocrit hemoglobin.   DVT prophylaxis: Lovenox SQ. Code Status:   Full code. Family Communication:   Disposition Plan:   Patient is from:  Home.  Anticipated DC to:  TBD.  Anticipated DC date:  06/06/2021.  Anticipated DC barriers: Clinical status.  Consults called:   Admission status:  Observation/telemetry.  High severity after the patient presented with decreased oral intake for the past few days in the setting of visual hallucinations at home.  The patient will be admitted for IV rehydration, evaluation by psychiatry and transitional care team.  Severity of Illness:  Reubin Milan MD [facility]  How to contact the [facility] Attending or Consulting provider [place] or covering provider during after hours [place], for this patient?   Check the care team in [facility] and look for a) attending/consulting TRH provider listed and b) the [facility] team listed Log into [email or web address] and use [place]'s universal password to access. If you do not have the password, please contact the hospital operator. Locate the [facility] provider you are looking for under [facility] and page to a number that you can be directly reached. If you still have difficulty  reaching the provider, please page the [facility] (Director on Call) for the Hospitalists listed on amion for assistance.  06/04/2021, 5:05 PM   This document was prepared using [employer] and may contain some unintended transcription errors

## 2021-06-04 NOTE — ED Notes (Signed)
EDP at the bedside for eval

## 2021-06-05 ENCOUNTER — Other Ambulatory Visit: Payer: Self-pay

## 2021-06-05 DIAGNOSIS — M6281 Muscle weakness (generalized): Secondary | ICD-10-CM | POA: Diagnosis not present

## 2021-06-05 DIAGNOSIS — I10 Essential (primary) hypertension: Secondary | ICD-10-CM

## 2021-06-05 DIAGNOSIS — E785 Hyperlipidemia, unspecified: Secondary | ICD-10-CM | POA: Diagnosis present

## 2021-06-05 DIAGNOSIS — R7303 Prediabetes: Secondary | ICD-10-CM | POA: Diagnosis present

## 2021-06-05 DIAGNOSIS — R5381 Other malaise: Secondary | ICD-10-CM | POA: Diagnosis present

## 2021-06-05 DIAGNOSIS — N1832 Chronic kidney disease, stage 3b: Secondary | ICD-10-CM

## 2021-06-05 DIAGNOSIS — Z87440 Personal history of urinary (tract) infections: Secondary | ICD-10-CM | POA: Diagnosis not present

## 2021-06-05 DIAGNOSIS — F0393 Unspecified dementia, unspecified severity, with mood disturbance: Secondary | ICD-10-CM | POA: Diagnosis present

## 2021-06-05 DIAGNOSIS — N179 Acute kidney failure, unspecified: Secondary | ICD-10-CM | POA: Diagnosis present

## 2021-06-05 DIAGNOSIS — F419 Anxiety disorder, unspecified: Secondary | ICD-10-CM | POA: Diagnosis present

## 2021-06-05 DIAGNOSIS — I959 Hypotension, unspecified: Secondary | ICD-10-CM | POA: Diagnosis not present

## 2021-06-05 DIAGNOSIS — R0602 Shortness of breath: Secondary | ICD-10-CM | POA: Diagnosis not present

## 2021-06-05 DIAGNOSIS — E860 Dehydration: Secondary | ICD-10-CM | POA: Diagnosis present

## 2021-06-05 DIAGNOSIS — Z66 Do not resuscitate: Secondary | ICD-10-CM | POA: Diagnosis present

## 2021-06-05 DIAGNOSIS — R2689 Other abnormalities of gait and mobility: Secondary | ICD-10-CM | POA: Diagnosis not present

## 2021-06-05 DIAGNOSIS — R059 Cough, unspecified: Secondary | ICD-10-CM | POA: Diagnosis not present

## 2021-06-05 DIAGNOSIS — Z85828 Personal history of other malignant neoplasm of skin: Secondary | ICD-10-CM | POA: Diagnosis not present

## 2021-06-05 DIAGNOSIS — R339 Retention of urine, unspecified: Secondary | ICD-10-CM | POA: Diagnosis present

## 2021-06-05 DIAGNOSIS — Z7401 Bed confinement status: Secondary | ICD-10-CM | POA: Diagnosis not present

## 2021-06-05 DIAGNOSIS — F0394 Unspecified dementia, unspecified severity, with anxiety: Secondary | ICD-10-CM | POA: Diagnosis present

## 2021-06-05 DIAGNOSIS — R41841 Cognitive communication deficit: Secondary | ICD-10-CM | POA: Diagnosis not present

## 2021-06-05 DIAGNOSIS — M6282 Rhabdomyolysis: Secondary | ICD-10-CM | POA: Diagnosis present

## 2021-06-05 DIAGNOSIS — U071 COVID-19: Secondary | ICD-10-CM | POA: Diagnosis present

## 2021-06-05 DIAGNOSIS — Z6841 Body Mass Index (BMI) 40.0 and over, adult: Secondary | ICD-10-CM | POA: Diagnosis not present

## 2021-06-05 DIAGNOSIS — I129 Hypertensive chronic kidney disease with stage 1 through stage 4 chronic kidney disease, or unspecified chronic kidney disease: Secondary | ICD-10-CM | POA: Diagnosis present

## 2021-06-05 DIAGNOSIS — R32 Unspecified urinary incontinence: Secondary | ICD-10-CM | POA: Diagnosis not present

## 2021-06-05 DIAGNOSIS — G9341 Metabolic encephalopathy: Secondary | ICD-10-CM | POA: Diagnosis present

## 2021-06-05 DIAGNOSIS — N323 Diverticulum of bladder: Secondary | ICD-10-CM | POA: Diagnosis not present

## 2021-06-05 DIAGNOSIS — E669 Obesity, unspecified: Secondary | ICD-10-CM | POA: Diagnosis present

## 2021-06-05 DIAGNOSIS — D509 Iron deficiency anemia, unspecified: Secondary | ICD-10-CM | POA: Diagnosis present

## 2021-06-05 DIAGNOSIS — I69828 Other speech and language deficits following other cerebrovascular disease: Secondary | ICD-10-CM | POA: Diagnosis not present

## 2021-06-05 DIAGNOSIS — M1990 Unspecified osteoarthritis, unspecified site: Secondary | ICD-10-CM | POA: Diagnosis not present

## 2021-06-05 DIAGNOSIS — R1312 Dysphagia, oropharyngeal phase: Secondary | ICD-10-CM | POA: Diagnosis not present

## 2021-06-05 DIAGNOSIS — I69398 Other sequelae of cerebral infarction: Secondary | ICD-10-CM | POA: Diagnosis not present

## 2021-06-05 DIAGNOSIS — E875 Hyperkalemia: Secondary | ICD-10-CM | POA: Diagnosis present

## 2021-06-05 DIAGNOSIS — Z515 Encounter for palliative care: Secondary | ICD-10-CM | POA: Diagnosis not present

## 2021-06-05 DIAGNOSIS — Z7189 Other specified counseling: Secondary | ICD-10-CM | POA: Diagnosis not present

## 2021-06-05 DIAGNOSIS — R4182 Altered mental status, unspecified: Secondary | ICD-10-CM | POA: Diagnosis not present

## 2021-06-05 DIAGNOSIS — R0902 Hypoxemia: Secondary | ICD-10-CM | POA: Diagnosis not present

## 2021-06-05 DIAGNOSIS — D631 Anemia in chronic kidney disease: Secondary | ICD-10-CM | POA: Diagnosis present

## 2021-06-05 DIAGNOSIS — Z8673 Personal history of transient ischemic attack (TIA), and cerebral infarction without residual deficits: Secondary | ICD-10-CM | POA: Diagnosis not present

## 2021-06-05 DIAGNOSIS — J1282 Pneumonia due to coronavirus disease 2019: Secondary | ICD-10-CM | POA: Diagnosis present

## 2021-06-05 DIAGNOSIS — I69891 Dysphagia following other cerebrovascular disease: Secondary | ICD-10-CM | POA: Diagnosis not present

## 2021-06-05 DIAGNOSIS — F32A Depression, unspecified: Secondary | ICD-10-CM | POA: Diagnosis present

## 2021-06-05 LAB — BASIC METABOLIC PANEL
Anion gap: 5 (ref 5–15)
BUN: 59 mg/dL — ABNORMAL HIGH (ref 8–23)
CO2: 23 mmol/L (ref 22–32)
Calcium: 8.6 mg/dL — ABNORMAL LOW (ref 8.9–10.3)
Chloride: 107 mmol/L (ref 98–111)
Creatinine, Ser: 2.19 mg/dL — ABNORMAL HIGH (ref 0.44–1.00)
GFR, Estimated: 22 mL/min — ABNORMAL LOW (ref >60)
Glucose, Bld: 100 mg/dL — ABNORMAL HIGH (ref 70–99)
Potassium: 4.8 mmol/L (ref 3.5–5.1)
Sodium: 135 mmol/L (ref 135–145)

## 2021-06-05 LAB — CBC
HCT: 29.6 % — ABNORMAL LOW (ref 36.0–46.0)
Hemoglobin: 9.7 g/dL — ABNORMAL LOW (ref 12.0–15.0)
MCH: 31.9 pg (ref 26.0–34.0)
MCHC: 32.8 g/dL (ref 30.0–36.0)
MCV: 97.4 fL (ref 80.0–100.0)
Platelets: 201 10*3/uL (ref 150–400)
RBC: 3.04 MIL/uL — ABNORMAL LOW (ref 3.87–5.11)
RDW: 14.8 % (ref 11.5–15.5)
WBC: 5.3 10*3/uL (ref 4.0–10.5)
nRBC: 0 % (ref 0.0–0.2)

## 2021-06-05 MED ORDER — LACTATED RINGERS IV SOLN: INTRAVENOUS | Status: AC

## 2021-06-05 NOTE — Progress Notes (Signed)
PROGRESS NOTE    Carol Page  YBO:[id number] DOB: 1936/03/09 DOA: 06/04/2021 PCP: Jearld Fenton, NP    Chief Complaint  Patient presents with   Altered Mental Status    Brief Narrative:  Carol Page is a 85 y.o. female with medical history significant of seasonal allergies, osteoarthritis, cellulitis of the left leg, depression, hyperlipidemia, hypertension, prediabetes, proctitis, nasal skin cancer, history of other non-hemorrhagic stroke, stage 3b CKD, urinary incontinence who is coming to the emergency department via EMS from home due to confusion and hallucinations for the past 2 to 3 days. CT head without contrast did not show any acute abnormality.  There was chronic small vessel disease.    Assessment & Plan:   Principal Problem:   AKI (acute kidney injury) ([place]) Active Problems:   Essential hypertension   HLD (hyperlipidemia)   Pre-diabetes   Anxiety and depression   CKD (chronic kidney disease) stage 3, GFR 30-59 ml/min (HCC)   Iron deficiency anemia   Hyperkalemia   AKI: Secondary to poor oral intake/ dehydration.  Baseline creatinine between 1.3 to 1.5  Admitted with a creatinine of 2.38, started on IV fluids and creatinine has improved to 2.19.    Hyperkalemia:  Probably from dehydration:  Resolved with IV fluids.     Mild rhabdomyolysis:  Improvement in CK.    Anemia of chronic disease;  Baseline creatinine around 9.    Hypertension:  Well controlled.    Stage 3a CKD:    Anxiety and Depression:  Resume home meds.   Hyperlipidemia:  Resume statin.    Stroke:  Resume plavix and statin.    Mild acute metabolic encephalopathy : - probably from poor oral intake and dehydration and AKI.  - she is alert and answering simple questions.   COVID 19 infection  last week:  Pt denies any chest pain or sob.  No hypoxia.   DVT prophylaxis: (Lovenox/) Code Status: (DNR) Family Communication: (NONE AT BEDSIDE) Disposition:   Status  is: inpatient.   The patient will require care spanning > 2 midnights and should be moved to inpatient because: IV fluids.       Consultants:  None.   Procedures: none.   Antimicrobials:  Antibiotics Given (last 72 hours)     None         Subjective: No new complaints.   Objective: Vitals:   06/05/21 1000 06/05/21 1200 06/05/21 1330 06/05/21 1400  BP: (!) 144/102 (!) 157/130 (!) 143/89   Pulse: 69 71 73 72  Resp: 16 13 13 16   Temp:    97.7 F (36.5 C)  TempSrc:    Oral  SpO2: 96% 96% 97% 95%    Intake/Output Summary (Last 24 hours) at 06/05/2021 1415 Last data filed at 06/04/2021 1657 Gross per 24 hour  Intake 999 ml  Output --  Net 999 ml   There were no vitals filed for this visit.  Examination:  General exam: Chronically ill-appearing lady not in any kind of distress at this time. Respiratory system: Clear to auscultation. Respiratory effort normal. Cardiovascular system: S1 & S2 heard, RRR. No JVD,  No pedal edema. Gastrointestinal system: Abdomen is nondistended, soft and nontender.. Normal bowel sounds heard. Central nervous system: Alert , confused.  Extremities: Symmetric 5 x 5 power. Skin: No rashes, lesions or ulcers Psychiatry:  Mood & affect appropriate.     Data Reviewed: I have personally reviewed following labs and imaging studies  CBC: Recent Labs  Lab 06/04/21 1325 06/05/21 0455  WBC 4.7 5.3  NEUTROABS 3.2  --   HGB 11.0* 9.7*  HCT 34.1* 29.6*  MCV 98.3 97.4  PLT 209 485    Basic Metabolic Panel: Recent Labs  Lab 06/04/21 1325 06/05/21 0455  NA 137 135  K 5.5* 4.8  CL 106 107  CO2 25 23  GLUCOSE 108* 100*  BUN 74* 59*  CREATININE 2.38* 2.19*  CALCIUM 9.1 8.6*    GFR: CrCl cannot be calculated (Unknown ideal weight.).  Liver Function Tests: Recent Labs  Lab 06/04/21 1325  AST 17  ALT 14  ALKPHOS 78  BILITOT 0.5  PROT 7.4  ALBUMIN 3.8    CBG: No results for input(s): GLUCAP in the last 168  hours.   Recent Results (from the past 240 hour(s))  Resp Panel by RT-PCR (Flu A&B, Covid) Nasopharyngeal Swab     Status: Abnormal   Collection Time: 06/04/21  1:26 PM   Specimen: Nasopharyngeal Swab; Nasopharyngeal(NP) swabs in vial transport medium  Result Value Ref Range Status   SARS Coronavirus 2 by RT PCR POSITIVE (A) NEGATIVE Final    Comment: RESULT CALLED TO, READ BACK BY AND VERIFIED WITH: DANIEL,C. RN AT 1726 06/04/21 MULLINS,T (NOTE) SARS-CoV-2 target nucleic acids are DETECTED.  The SARS-CoV-2 RNA is generally detectable in upper respiratory specimens during the acute phase of infection. Positive results are indicative of the presence of the identified virus, but do not rule out bacterial infection or co-infection with other pathogens not detected by the test. Clinical correlation with patient history and other diagnostic information is necessary to determine patient infection status. The expected result is Negative.  Fact Sheet for Patients: EntrepreneurPulse.com.au  Fact Sheet for Healthcare Providers: IncredibleEmployment.be  This test is not yet approved or cleared by the [place] FDA and  has been authorized for detection and/or diagnosis of SARS-CoV-2 by FDA under an Emergency Use Authorization (EUA).  This EUA will remain in effect (meaning this test  can be used) for the duration of  the COVID-19 declaration under Section 564(b)(1) of the Act, 21 U.S.C. section 360bbb-3(b)(1), unless the authorization is terminated or revoked sooner.     Influenza A by PCR NEGATIVE NEGATIVE Final   Influenza B by PCR NEGATIVE NEGATIVE Final    Comment: (NOTE) The Xpert Xpress SARS-CoV-2/FLU/RSV plus assay is intended as an aid in the diagnosis of influenza from Nasopharyngeal swab specimens and should not be used as a sole basis for treatment. Nasal washings and aspirates are unacceptable for Xpert Xpress  SARS-CoV-2/FLU/RSV testing.  Fact Sheet for Patients: EntrepreneurPulse.com.au  Fact Sheet for Healthcare Providers: IncredibleEmployment.be  This test is not yet approved or cleared by the [place] FDA and has been authorized for detection and/or diagnosis of SARS-CoV-2 by FDA under an Emergency Use Authorization (EUA). This EUA will remain in effect (meaning this test can be used) for the duration of the COVID-19 declaration under Section 564(b)(1) of the Act, 21 U.S.C. section 360bbb-3(b)(1), unless the authorization is terminated or revoked.  Performed at [facility], [place] [address] [place], [place] [address]          Radiology Studies: CT Head Wo Contrast  Result Date: 06/04/2021 CLINICAL DATA:  Mental status change of unknown etiology. EXAM: CT HEAD WITHOUT CONTRAST TECHNIQUE: Contiguous axial images were obtained from the base of the skull through the vertex without intravenous contrast. COMPARISON:  04/21/2018 FINDINGS: Brain: No evidence of acute infarction, hemorrhage, hydrocephalus, extra-axial collection or mass lesion/mass effect. There is diffuse  low-attenuation within the subcortical and periventricular white matter compatible with chronic microvascular disease. Vascular: No hyperdense vessel or unexpected calcification. Skull: Normal. Negative for fracture or focal lesion. Sinuses/Orbits: No acute finding. Other: None. IMPRESSION: 1. No acute intracranial abnormalities. 2. Chronic small vessel ischemic change. Electronically Signed   By: Kerby Moors M.D.   On: 06/04/2021 14:47        Scheduled Meds:  atorvastatin  80 mg Oral Daily   buPROPion ER  100 mg Oral BID   clopidogrel  75 mg Oral QPM   diltiazem  180 mg Oral Daily   enoxaparin (LOVENOX) injection  30 mg Subcutaneous QHS   escitalopram  10 mg Oral Daily   gabapentin  300 mg Oral BID   loratadine  10 mg Oral Daily   pantoprazole  40 mg  Oral Daily   potassium chloride SA  20 mEq Oral Daily   traZODone  100 mg Oral QHS   vitamin B-12  500 mcg Oral Daily   Continuous Infusions:  lactated ringers 100 mL/hr at 06/05/21 0442     LOS: 0 days        Hosie Poisson, MD [facility]   To contact the attending provider between 7A-7P or the covering provider during after hours 7P-7A, please log into the web site [email or web address] and access using universal  password for that web site. If you do not have the password, please call the hospital operator.  06/05/2021, 2:15 PM

## 2021-06-05 NOTE — ED Notes (Signed)
Give pills slowly making sure pt swallows each time. Pt does pocket pills in her cheek and under her tongue.

## 2021-06-06 ENCOUNTER — Inpatient Hospital Stay (HOSPITAL_COMMUNITY): Payer: Medicare Other

## 2021-06-06 DIAGNOSIS — N1832 Chronic kidney disease, stage 3b: Secondary | ICD-10-CM | POA: Diagnosis not present

## 2021-06-06 DIAGNOSIS — I10 Essential (primary) hypertension: Secondary | ICD-10-CM | POA: Diagnosis not present

## 2021-06-06 DIAGNOSIS — N179 Acute kidney failure, unspecified: Secondary | ICD-10-CM | POA: Diagnosis not present

## 2021-06-06 LAB — CBC WITH DIFFERENTIAL/PLATELET
Abs Immature Granulocytes: 0.09 10*3/uL — ABNORMAL HIGH (ref 0.00–0.07)
Basophils Absolute: 0 10*3/uL (ref 0.0–0.1)
Basophils Relative: 0 %
Eosinophils Absolute: 0.1 10*3/uL (ref 0.0–0.5)
Eosinophils Relative: 2 %
HCT: 32.7 % — ABNORMAL LOW (ref 36.0–46.0)
Hemoglobin: 10.8 g/dL — ABNORMAL LOW (ref 12.0–15.0)
Immature Granulocytes: 1 %
Lymphocytes Relative: 11 %
Lymphs Abs: 1 10*3/uL (ref 0.7–4.0)
MCH: 32 pg (ref 26.0–34.0)
MCHC: 33 g/dL (ref 30.0–36.0)
MCV: 97 fL (ref 80.0–100.0)
Monocytes Absolute: 1.1 10*3/uL — ABNORMAL HIGH (ref 0.1–1.0)
Monocytes Relative: 13 %
Neutro Abs: 6.5 10*3/uL (ref 1.7–7.7)
Neutrophils Relative %: 73 %
Platelets: 210 10*3/uL (ref 150–400)
RBC: 3.37 MIL/uL — ABNORMAL LOW (ref 3.87–5.11)
RDW: 14.4 % (ref 11.5–15.5)
WBC: 8.8 10*3/uL (ref 4.0–10.5)
nRBC: 0 % (ref 0.0–0.2)

## 2021-06-06 LAB — BASIC METABOLIC PANEL
Anion gap: 10 (ref 5–15)
BUN: 58 mg/dL — ABNORMAL HIGH (ref 8–23)
CO2: 21 mmol/L — ABNORMAL LOW (ref 22–32)
Calcium: 9.2 mg/dL (ref 8.9–10.3)
Chloride: 108 mmol/L (ref 98–111)
Creatinine, Ser: 2.37 mg/dL — ABNORMAL HIGH (ref 0.44–1.00)
GFR, Estimated: 20 mL/min — ABNORMAL LOW (ref >60)
Glucose, Bld: 97 mg/dL (ref 70–99)
Potassium: 5.2 mmol/L — ABNORMAL HIGH (ref 3.5–5.1)
Sodium: 139 mmol/L (ref 135–145)

## 2021-06-06 LAB — FOLATE: Folate: 9.4 ng/mL (ref >5.9)

## 2021-06-06 LAB — VITAMIN B12: Vitamin B-12: 2989 pg/mL — ABNORMAL HIGH (ref 180–914)

## 2021-06-06 LAB — C-REACTIVE PROTEIN: CRP: 0.7 mg/dL (ref <1.0)

## 2021-06-06 LAB — TSH: TSH: 3.225 u[IU]/mL (ref 0.350–4.500)

## 2021-06-06 MED ORDER — DEXAMETHASONE SODIUM PHOSPHATE 10 MG/ML IJ SOLN
6.0000 mg | Freq: Every day | INTRAMUSCULAR | Status: DC
  Administered 2021-06-06 – 2021-06-09 (×4): 6 mg via INTRAVENOUS
  Filled 2021-06-06 (×4): qty 1

## 2021-06-06 MED ORDER — SODIUM ZIRCONIUM CYCLOSILICATE 10 G PO PACK
10.0000 g | PACK | Freq: Two times a day (BID) | ORAL | Status: DC
  Administered 2021-06-06 – 2021-06-07 (×3): 10 g via ORAL
  Filled 2021-06-06 (×4): qty 1

## 2021-06-06 MED ORDER — SODIUM CHLORIDE 0.9 % IV SOLN: INTRAVENOUS | Status: DC

## 2021-06-06 MED ORDER — LACTATED RINGERS IV SOLN: INTRAVENOUS | Status: DC

## 2021-06-06 NOTE — Progress Notes (Signed)
Bladder scan at 0515 >999.  In & Out cath at [facility] 1600cc

## 2021-06-06 NOTE — Evaluation (Signed)
Physical Therapy Evaluation Patient Details Name: Taleya Whitcher MRN: [id number] DOB: [birth year] Today's Date: 06/06/2021  History of Present Illness  85yo female who presented on 06/04/21 with increased confusion, hallucinations, and weakness over the past 2-3 days. Found to be Covid + and with AKI on CKD. PMH OA, cellulitis, HLD, HTN, pre-DM, CVA, CKD, urinary incontinence, skin CA  Clinical Impression   Patient received in bed, pleasantly confused and oriented to self only; no family available to provide PLOF details or history, so all information taken from prior charting. Needs MaxA to roll side to side as well as totalAx1 for repositioning with use of hospital bed features. No signs of heel sores or contractures- I am unsure what her recent PLOF really is, per RN family has relayed that she has recently been at the Max-TotalA level of care at home. Left in bed positioned to comfort. If family wishes for her to return home, she will need HHPT with 24/7A as well as DME as below; if family feels they cannot provide adequate care, she will need SNF.       Recommendations for follow up therapy are one component of a multi-disciplinary discharge planning process, led by the attending physician.  Recommendations may be updated based on patient status, additional functional criteria and insurance authorization.  Follow Up Recommendations Other (comment) (HHPT with 24/7A and DME as below if family agreeable; otherwise, will need SNF if familiy does not feel they can safely take her home)    Assistance Recommended at Discharge Frequent or constant Supervision/Assistance  Functional Status Assessment Patient has had a recent decline in their functional status and/or demonstrates limited ability to make significant improvements in function in a reasonable and predictable amount of time  Equipment Recommendations  BSC/3in1;Wheelchair (measurements PT);Wheelchair cushion (measurements PT);Hospital  bed;Other (comment) (hoyer lift and slings; bariatric WC and BSC)    Recommendations for Other Services       Precautions / Restrictions Precautions Precautions: Fall;Other (comment) Precaution Comments: incontinence, hx CVA, confused Restrictions Weight Bearing Restrictions: No      Mobility  Bed Mobility Overal bed mobility: Needs Assistance Bed Mobility: Rolling Rolling: Max assist         General bed mobility comments: heavy maxA to roll side to side in bed, only able to help minimally; needed totalA for repositioning even with use of bed features    Transfers                   General transfer comment: unable    Ambulation/Gait               General Gait Details: unable  Stairs            Wheelchair Mobility    Modified Rankin (Stroke Patients Only)       Balance                                             Pertinent Vitals/Pain Pain Assessment: Faces Faces Pain Scale: Hurts little more Pain Location: with activity Pain Descriptors / Indicators: Discomfort;Aching Pain Intervention(s): Limited activity within patient's tolerance;Monitored during session    Home Living Family/patient expects to be discharged to:: Private residence Living Arrangements: Children;Other (Comment) (lives with daughter) Available Help at Discharge: Family;Available 24 hours/day Type of Home: House Home Access: Stairs to enter Entrance Stairs-Rails: None Entrance Stairs-Number of Steps: 2  Home Layout: One level Home Equipment: BSC/3in1;Tub bench;Rollator (4 wheels) Additional Comments: all info taken from prior charting- no family available and patient A&Ox1    Prior Function               Mobility Comments: per report from RN, has been max-totalA for all aspects of care with family recently       Hand Dominance   Dominant Hand: Right    Extremity/Trunk Assessment   Upper Extremity Assessment Upper Extremity  Assessment: Generalized weakness    Lower Extremity Assessment Lower Extremity Assessment: Generalized weakness (3/5 at best with general functional assessment)    Cervical / Trunk Assessment Cervical / Trunk Assessment: Kyphotic  Communication   Communication: No difficulties  Cognition Arousal/Alertness: Awake/alert Behavior During Therapy: Flat affect Overall Cognitive Status: History of cognitive impairments - at baseline                                 General Comments: A&O to self only, very pleasantly confused but unable to provide hx/PLOF; no family available to clarify baseline status        General Comments General comments (skin integrity, edema, etc.): unable to safely get to EOB- will need +2 to safely progress mobility    Exercises     Assessment/Plan    PT Assessment Patient needs continued PT services  PT Problem List Decreased strength;Decreased cognition;Obesity;Decreased activity tolerance;Decreased safety awareness;Decreased balance;Decreased mobility;Decreased coordination       PT Treatment Interventions DME instruction;Balance training;Gait training;Functional mobility training;Patient/family education;Therapeutic activities;Therapeutic exercise    PT Goals (Current goals can be found in the Care Plan section)  Acute Rehab PT Goals PT Goal Formulation: Patient unable to participate in goal setting Time For Goal Achievement: 06/20/21    Frequency Min 2X/week   Barriers to discharge        Co-evaluation               AM-PAC PT "6 Clicks" Mobility  Outcome Measure Help needed turning from your back to your side while in a flat bed without using bedrails?: A Lot Help needed moving from lying on your back to sitting on the side of a flat bed without using bedrails?: Total Help needed moving to and from a bed to a chair (including a wheelchair)?: Total Help needed standing up from a chair using your arms (e.g., wheelchair or  bedside chair)?: Total Help needed to walk in hospital room?: Total Help needed climbing 3-5 steps with a railing? : Total 6 Click Score: 7    End of Session   Activity Tolerance: Patient tolerated treatment well Patient left: in bed;with call bell/phone within reach;with bed alarm set Nurse Communication: Mobility status PT Visit Diagnosis: Muscle weakness (generalized) (M62.81);Other abnormalities of gait and mobility (R26.89)    Time: 5681-2751 PT Time Calculation (min) (ACUTE ONLY): 20 min   Charges:   PT Evaluation $PT Eval Moderate Complexity: 1 Mod         Normagene Harvie U PT, DPT, PN2   Supplemental Physical Therapist [place]    Pager [phone] Acute Rehab Office [phone]

## 2021-06-06 NOTE — Progress Notes (Signed)
PROGRESS NOTE    Carol Page  TIW:[id number] DOB: 1936/03/21 DOA: 06/04/2021 PCP: Jearld Fenton, NP    Chief Complaint  Patient presents with   Altered Mental Status    Brief Narrative:  Carol Page is a 85 y.o. female with medical history significant of seasonal allergies, osteoarthritis, cellulitis of the left leg, depression, hyperlipidemia, hypertension, prediabetes, proctitis, nasal skin cancer, history of other non-hemorrhagic stroke, stage 3b CKD, urinary incontinence who is coming to the emergency department via EMS from home due to confusion and hallucinations for the past 2 to 3 days. CT head without contrast did not show any acute abnormality.  There was chronic small vessel disease.    Assessment & Plan:   Principal Problem:   AKI (acute kidney injury) ([facility]) Active Problems:   Essential hypertension   HLD (hyperlipidemia)   Pre-diabetes   Anxiety and depression   CKD (chronic kidney disease) stage 3, GFR 30-59 ml/min (HCC)   Iron deficiency anemia   Hyperkalemia   AKI: Secondary to poor oral intake/ dehydration.  Baseline creatinine between 1.3 to 1.5  Admitted with a creatinine of 2.38, started on IV fluids and creatinine has improved to 2.19, but back to 2.3 today.  Minimal oral intake despite encouragement.    Hyperkalemia:  Probably from dehydration:  Resolved with IV fluids.     Mild rhabdomyolysis:  Gently hydrate and repeat CPK in am.    Anemia of chronic disease;  Baseline creatinine around 9.    Hypertension:  Well controlled. BP parameters are optimal.    Stage 3a CKD:    Anxiety and Depression:  Resume home meds.   Hyperlipidemia:  Resume statin.    Stroke:  Resume plavix and statin.    Mild acute metabolic encephalopathy : - probably from poor oral intake and dehydration and AKI.  - she is alert and answering simple questions but very confused.    COVID 19 infection  last week:  Pt denies any chest pain or sob.   No hypoxia.CXR shows pneumonitis. Will add decadron to the regimen, check CRP.    DVT prophylaxis: (Lovenox) Code Status: (DNR) Family Communication: (NONE AT BEDSIDE), discussed with daughter over the phone.  Disposition:   Status is: inpatient.   The patient will require care spanning > 2 midnights and should be moved to inpatient because: IV fluids.       Consultants:  Palliative care consult.   Procedures: none.   Antimicrobials:  Antibiotics Given (last 72 hours)     None         Subjective: Appears comfortable.   Objective: Vitals:   06/05/21 2324 06/06/21 0218 06/06/21 0538 06/06/21 1454  BP: (!) 131/98 (!) 128/101 (!) 123/49 (!) 143/46  Pulse:  81 87 78  Resp:  20 20 16   Temp:  98.1 F (36.7 C) 98.1 F (36.7 C) 98.5 F (36.9 C)  TempSrc:  Oral Oral Oral  SpO2:  99% 98% 96%    Intake/Output Summary (Last 24 hours) at 06/06/2021 1648 Last data filed at 06/06/2021 1219 Gross per 24 hour  Intake 1841.74 ml  Output 1600 ml  Net 241.74 ml    There were no vitals filed for this visit.  Examination:  General exam: Appears calm and comfortable  Respiratory system: Clear to auscultation. Respiratory effort normal. Cardiovascular system: S1 & S2 heard, RRR. No JVD,  No pedal edema. Gastrointestinal system: Abdomen is nondistended, soft and nontender. Normal bowel sounds heard. Central nervous system: Alert and oriented.  No focal neurological deficits. Extremities: Symmetric 5 x 5 power. Skin: No rashes, lesions or ulcers Psychiatry:  Mood & affect appropriate.      Data Reviewed: I have personally reviewed following labs and imaging studies  CBC: Recent Labs  Lab 06/04/21 1325 06/05/21 0455 06/06/21 0448  WBC 4.7 5.3 8.8  NEUTROABS 3.2  --  6.5  HGB 11.0* 9.7* 10.8*  HCT 34.1* 29.6* 32.7*  MCV 98.3 97.4 97.0  PLT 209 201 210     Basic Metabolic Panel: Recent Labs  Lab 06/04/21 1325 06/05/21 0455 06/06/21 0448  NA 137 135 139   K 5.5* 4.8 5.2*  CL 106 107 108  CO2 25 23 21*  GLUCOSE 108* 100* 97  BUN 74* 59* 58*  CREATININE 2.38* 2.19* 2.37*  CALCIUM 9.1 8.6* 9.2     GFR: CrCl cannot be calculated (Unknown ideal weight.).  Liver Function Tests: Recent Labs  Lab 06/04/21 1325  AST 17  ALT 14  ALKPHOS 78  BILITOT 0.5  PROT 7.4  ALBUMIN 3.8     CBG: No results for input(s): GLUCAP in the last 168 hours.   Recent Results (from the past 240 hour(s))  Resp Panel by RT-PCR (Flu A&B, Covid) Nasopharyngeal Swab     Status: Abnormal   Collection Time: 06/04/21  1:26 PM   Specimen: Nasopharyngeal Swab; Nasopharyngeal(NP) swabs in vial transport medium  Result Value Ref Range Status   SARS Coronavirus 2 by RT PCR POSITIVE (A) NEGATIVE Final    Comment: RESULT CALLED TO, READ BACK BY AND VERIFIED WITH: DANIEL,C. RN AT 1726 06/04/21 MULLINS,T (NOTE) SARS-CoV-2 target nucleic acids are DETECTED.  The SARS-CoV-2 RNA is generally detectable in upper respiratory specimens during the acute phase of infection. Positive results are indicative of the presence of the identified virus, but do not rule out bacterial infection or co-infection with other pathogens not detected by the test. Clinical correlation with patient history and other diagnostic information is necessary to determine patient infection status. The expected result is Negative.  Fact Sheet for Patients: EntrepreneurPulse.com.au  Fact Sheet for Healthcare Providers: IncredibleEmployment.be  This test is not yet approved or cleared by the [place] FDA and  has been authorized for detection and/or diagnosis of SARS-CoV-2 by FDA under an Emergency Use Authorization (EUA).  This EUA will remain in effect (meaning this test  can be used) for the duration of  the COVID-19 declaration under Section 564(b)(1) of the Act, 21 U.S.C. section 360bbb-3(b)(1), unless the authorization is terminated or revoked  sooner.     Influenza A by PCR NEGATIVE NEGATIVE Final   Influenza B by PCR NEGATIVE NEGATIVE Final    Comment: (NOTE) The Xpert Xpress SARS-CoV-2/FLU/RSV plus assay is intended as an aid in the diagnosis of influenza from Nasopharyngeal swab specimens and should not be used as a sole basis for treatment. Nasal washings and aspirates are unacceptable for Xpert Xpress SARS-CoV-2/FLU/RSV testing.  Fact Sheet for Patients: EntrepreneurPulse.com.au  Fact Sheet for Healthcare Providers: IncredibleEmployment.be  This test is not yet approved or cleared by the [place] FDA and has been authorized for detection and/or diagnosis of SARS-CoV-2 by FDA under an Emergency Use Authorization (EUA). This EUA will remain in effect (meaning this test can be used) for the duration of the COVID-19 declaration under Section 564(b)(1) of the Act, 21 U.S.C. section 360bbb-3(b)(1), unless the authorization is terminated or revoked.  Performed at [facility], [place] [address] [place], [place] [address]  Radiology Studies: US RENAL  Result Date: 06/06/2021 CLINICAL DATA:  Acute kidney injury EXAM: RENAL / URINARY TRACT ULTRASOUND COMPLETE COMPARISON:  CT abdomen pelvis 04/19/2018, ultrasound 01/09/2021 FINDINGS: Right Kidney: Renal measurements: 8.1 x 4.8 x 3.5 cm = volume: 71 mL. Renal cortical thinning. Echogenicity increased. There is a 2.1 cm simple cystic lesion. No associated mural nodularity, vascularity, septation. No mass or hydronephrosis visualized. Left Kidney: Renal measurements: 9.8 x 4.4 x 5 = volume: 107 mL. Renal cortical thinning. Echogenicity increased. No mass or hydronephrosis visualized. Urinary bladder: Urinary bladder diverticula. Appears normal for degree of bladder distention. Prevoid volume of 673 mL. Other: None. IMPRESSION: 1. Urinary bladder diverticula. 2. Increased renal parenchymal echogenicity  suggestive of renal parenchymal disease bilaterally. Electronically Signed   By: Iven Finn M.D.   On: 06/06/2021 15:14   DG CHEST PORT 1 VIEW  Result Date: 06/06/2021 CLINICAL DATA:  Cough, COVID positive EXAM: PORTABLE CHEST 1 VIEW COMPARISON:  Previous studies including the examination of 01/07/2021 FINDINGS: There is poor inspiration. There are no signs of alveolar pulmonary edema. Increased markings are seen in both lower lung fields. Central pulmonary vessels are prominent without signs of alveolar pulmonary edema. There is no significant pleural effusion or pneumothorax. IMPRESSION: Poor inspiration. Increased markings are seen in both lower lung fields suggesting atelectasis/pneumonitis. Part of this finding may be due to poor inspiration. Central pulmonary vessels are prominent without signs of alveolar pulmonary edema. Electronically Signed   By: Elmer Picker M.D.   On: 06/06/2021 10:51        Scheduled Meds:  atorvastatin  80 mg Oral Daily   buPROPion ER  100 mg Oral BID   clopidogrel  75 mg Oral QPM   dexamethasone (DECADRON) injection  6 mg Intravenous Daily   diltiazem  180 mg Oral Daily   enoxaparin (LOVENOX) injection  30 mg Subcutaneous QHS   escitalopram  10 mg Oral Daily   loratadine  10 mg Oral Daily   pantoprazole  40 mg Oral Daily   sodium zirconium cyclosilicate  10 g Oral BID   traZODone  100 mg Oral QHS   vitamin B-12  500 mcg Oral Daily   Continuous Infusions:  lactated ringers 100 mL/hr at 06/06/21 1444     LOS: 1 day        Hosie Poisson, MD [facility]   To contact the attending provider between 7A-7P or the covering provider during after hours 7P-7A, please log into the web site [email or web address] and access using universal [place] password for that web site. If you do not have the password, please call the hospital operator.  06/06/2021, 4:48 PM

## 2021-06-06 NOTE — Plan of Care (Signed)
  Problem: Pain Managment: Goal: General experience of comfort will improve Outcome: Progressing   Problem: Skin Integrity: Goal: Risk for impaired skin integrity will decrease Outcome: Progressing   

## 2021-06-07 ENCOUNTER — Inpatient Hospital Stay (HOSPITAL_COMMUNITY): Payer: Medicare Other

## 2021-06-07 DIAGNOSIS — N1832 Chronic kidney disease, stage 3b: Secondary | ICD-10-CM | POA: Diagnosis not present

## 2021-06-07 DIAGNOSIS — I10 Essential (primary) hypertension: Secondary | ICD-10-CM | POA: Diagnosis not present

## 2021-06-07 DIAGNOSIS — N179 Acute kidney failure, unspecified: Secondary | ICD-10-CM | POA: Diagnosis not present

## 2021-06-07 LAB — BASIC METABOLIC PANEL
Anion gap: 7 (ref 5–15)
BUN: 52 mg/dL — ABNORMAL HIGH (ref 8–23)
CO2: 20 mmol/L — ABNORMAL LOW (ref 22–32)
Calcium: 8.5 mg/dL — ABNORMAL LOW (ref 8.9–10.3)
Chloride: 110 mmol/L (ref 98–111)
Creatinine, Ser: 1.93 mg/dL — ABNORMAL HIGH (ref 0.44–1.00)
GFR, Estimated: 25 mL/min — ABNORMAL LOW (ref >60)
Glucose, Bld: 125 mg/dL — ABNORMAL HIGH (ref 70–99)
Potassium: 4.8 mmol/L (ref 3.5–5.1)
Sodium: 137 mmol/L (ref 135–145)

## 2021-06-07 MED ORDER — LORAZEPAM 2 MG/ML IJ SOLN
1.0000 mg | Freq: Once | INTRAMUSCULAR | Status: AC
  Administered 2021-06-07: 2 mg via INTRAVENOUS
  Filled 2021-06-07: qty 1

## 2021-06-07 MED ORDER — CHLORHEXIDINE GLUCONATE CLOTH 2 % EX PADS
6.0000 | MEDICATED_PAD | Freq: Every day | CUTANEOUS | Status: DC
  Administered 2021-06-07 – 2021-06-13 (×7): 6 via TOPICAL

## 2021-06-07 NOTE — Progress Notes (Signed)
Bladder scan results show 556 mL of urine retained in the patient's bladder.  Pt has had multiple other instances of retention in the last 24 hours including the first instance where 1622mL was removed by I+O cath.  Pt confused and exhibiting agitation during bladder scanning and previous I+O caths.  Order was obtained from night coverage MD [place] for foley insertion.  Will place foley and continue to monitor.

## 2021-06-07 NOTE — Evaluation (Signed)
Occupational Therapy Evaluation Patient Details Name: Carol Page MRN: [id number] DOB: 11-28-1935 Today's Date: 06/07/2021   History of Present Illness 85yo female who presented on 06/04/21 with increased confusion, hallucinations, and weakness over the past 2-3 days. Found to be Covid + and with AKI on CKD. PMH OA, cellulitis, HLD, HTN, pre-DM, CVA, CKD, urinary incontinence, skin CA. Admitted with AKI and COVID.   Clinical Impression   Carol Page is an 85 year old woman who presents with generalized weakness, decreased activity tolerance, residual left LE weakness per patient report , impaired balance, and impaired cognition. Patient required mod assist for transfer to side of bed and mod x 2 for standing and transfer to recliner. Patient needing set up for UB ADLs and max - total assist for LB ADLs. Patient able to follow commands and make conversation - alert to self and place but not situation.  Patient will benefit from skilled OT services while in hospital to improve deficits and learn compensatory strategies as needed in order to return to reduce caregiver burden. Per chart review - as no family present and patient unable to provide - patient has been requiring max -total assist and mostly bed bound for the last three weeks. Since decline is fairly new recommend short term rehab at discharge.     Recommendations for follow up therapy are one component of a multi-disciplinary discharge planning process, led by the attending physician.  Recommendations may be updated based on patient status, additional functional criteria and insurance authorization.   Follow Up Recommendations  Skilled nursing-short term rehab (<3 hours/day)    Assistance Recommended at Discharge Frequent or constant Supervision/Assistance  Functional Status Assessment  Patient has had a recent decline in their functional status and demonstrates the ability to make significant improvements in function in a  reasonable and predictable amount of time.  Equipment Recommendations   (TBD)    Recommendations for Other Services       Precautions / Restrictions Precautions Precautions: Fall;Other (comment) Precaution Comments: incontinence, hx CVA, confused      Mobility Bed Mobility Overal bed mobility: Needs Assistance Bed Mobility: Supine to Sit     Supine to sit: HOB elevated;+2 for safety/equipment;Mod assist     General bed mobility comments: mod assist to transfer to side of bed with patient assisting using bed rail.    Transfers Overall transfer level: Needs assistance Equipment used: Rolling walker (2 wheels) Transfers: Sit to/from Stand;Bed to chair/wheelchair/BSC Sit to Stand: Mod assist;From elevated surface;+2 safety/equipment;+2 physical assistance Stand pivot transfers: Mod assist;+2 physical assistance;+2 safety/equipment;From elevated surface         General transfer comment: Mod x 2 to stand from bed with patient sitting down after first stand. Able to stand again and then take steps with therapit providing multimodal cues to initiate the steps - patient somewhat fearful.      Balance Overall balance assessment: Needs assistance Sitting-balance support: No upper extremity supported;Feet supported Sitting balance-Leahy Scale: Fair     Standing balance support: During functional activity;Bilateral upper extremity supported   Standing balance comment: reliant on walker and external support.                           ADL either performed or assessed with clinical judgement   ADL Overall ADL's : Needs assistance/impaired Eating/Feeding: Set up;Sitting   Grooming: Set up;Sitting   Upper Body Bathing: Set up;Sitting   Lower Body Bathing: Maximal assistance;Sit to/from stand;+2  for safety/equipment   Upper Body Dressing : Set up;Sitting   Lower Body Dressing: Total assistance;Sit to/from stand;+2 for physical assistance Lower Body Dressing  Details (indicate cue type and reason): to don socks, requiring +2 assistance Toilet Transfer: Moderate assistance;+2 for physical assistance;BSC/3in1;Stand-pivot Toilet Transfer Details (indicate cue type and reason): mod x 2 to transfer to side of bed Toileting- Clothing Manipulation and Hygiene: Total assistance;Sit to/from stand       Functional mobility during ADLs: Moderate assistance;+2 for physical assistance;+2 for safety/equipment;Rolling walker (2 wheels)       Vision   Vision Assessment?: No apparent visual deficits     Perception     Praxis      Pertinent Vitals/Pain Pain Assessment: No/denies pain     Hand Dominance Right   Extremity/Trunk Assessment Upper Extremity Assessment Upper Extremity Assessment: Overall WFL for tasks assessed;RUE deficits/detail;LUE deficits/detail RUE Deficits / Details: WFL ROM, grossly 5/5 strength RUE Sensation: WNL RUE Coordination: WNL LUE Deficits / Details: WFL ROM, grossly 5/5 strength LUE Sensation: WNL LUE Coordination: WNL   Lower Extremity Assessment Lower Extremity Assessment: Defer to PT evaluation       Communication Communication Communication: No difficulties   Cognition Arousal/Alertness: Awake/alert Behavior During Therapy: WFL for tasks assessed/performed Overall Cognitive Status: History of cognitive impairments - at baseline                                 General Comments: A&O to self only, very pleasantly confused but unable to provide hx/PLOF; no family available to clarify baseline status     General Comments       Exercises     Shoulder Instructions      Home Living Family/patient expects to be discharged to:: Private residence Living Arrangements: Children;Other (Comment) (lives with daughter) Available Help at Discharge: Family;Available 24 hours/day Type of Home: House Home Access: Stairs to enter [employer] of Steps: 2 Entrance Stairs-Rails: None Home  Layout: One level     Bathroom Shower/Tub: Teacher, early years/pre: Standard Bathroom Accessibility: Yes   Home Equipment: BSC/3in1;Tub bench;Rollator (4 wheels)   Additional Comments: all info taken from prior charting- no family available and patient A&Ox1      Prior Functioning/Environment               Mobility Comments: per report from RN, has been max-totalA for all aspects of care with family recently          OT Problem List: Decreased strength;Decreased activity tolerance;Impaired balance (sitting and/or standing);Decreased cognition;Decreased knowledge of use of DME or AE;Obesity      OT Treatment/Interventions: Self-care/ADL training;Therapeutic exercise;DME and/or AE instruction;Therapeutic activities;Balance training;Patient/family education    OT Goals(Current goals can be found in the care plan section) Acute Rehab OT Goals OT Goal Formulation: Patient unable to participate in goal setting Time For Goal Achievement: 06/21/21 Potential to Achieve Goals: Fair  OT Frequency: Min 2X/week   Barriers to D/C:            Co-evaluation              AM-PAC OT "6 Clicks" Daily Activity     Outcome Measure Help from another person eating meals?: A Little Help from another person taking care of personal grooming?: A Little Help from another person toileting, which includes using toliet, bedpan, or urinal?: Total Help from another person bathing (including washing, rinsing, drying)?: A Lot Help  from another person to put on and taking off regular upper body clothing?: A Little Help from another person to put on and taking off regular lower body clothing?: Total 6 Click Score: 13   End of Session Equipment Utilized During Treatment: Rolling walker (2 wheels);Gait belt Nurse Communication: Mobility status  Activity Tolerance: Patient tolerated treatment well Patient left: in chair;with call bell/phone within reach;with chair alarm set  OT  Visit Diagnosis: Unsteadiness on feet (R26.81);Muscle weakness (generalized) (M62.81)                Time: 8307-3543 OT Time Calculation (min): 18 min Charges:  OT General Charges $OT Visit: 1 Visit OT Evaluation $OT Eval Low Complexity: 1 Low  Cleaster Shiffer, OTR/L [place]  Office [phone] Pager: [place] 06/07/2021, 4:16 PM

## 2021-06-07 NOTE — Progress Notes (Signed)
PROGRESS NOTE    Carol Page  RFF:[id number] DOB: 08-13-1935 DOA: 06/04/2021 PCP: Jearld Fenton, NP    Chief Complaint  Patient presents with   Altered Mental Status    Brief Narrative:  Carol Page is a 85 y.o. female with medical history significant of seasonal allergies, osteoarthritis, cellulitis of the left leg, depression, hyperlipidemia, hypertension, prediabetes, proctitis, nasal skin cancer, history of other non-hemorrhagic stroke, stage 3b CKD, urinary incontinence who is coming to the emergency department via EMS from home due to confusion and hallucinations for the past 2 to 3 days. CT head without contrast did not show any acute abnormality.  There was chronic small vessel disease.    Assessment & Plan:   Principal Problem:   AKI (acute kidney injury) ([place]) Active Problems:   Essential hypertension   HLD (hyperlipidemia)   Pre-diabetes   Anxiety and depression   CKD (chronic kidney disease) stage 3, GFR 30-59 ml/min (HCC)   Iron deficiency anemia   Hyperkalemia   AKI: Secondary to poor oral intake/ dehydration.  Baseline creatinine between 1.3 to 1.5  Admitted with a creatinine of 2.38, started on IV fluids and creatinine has improved to 2.19,  TO 1.9 today.  Continue with gentle hydration.  US renal does not show any hydronephrosis.     Hyperkalemia:  Probably from dehydration:  Resolved with IV fluids and lokelma.     Mild rhabdomyolysis:  Gently hydrate    Anemia of chronic disease;  Baseline creatinine around 9.    Hypertension:  Well controlled. BP parameters are optimal.    Stage 3a CKD:    Anxiety and Depression:  Resume home meds.   Hyperlipidemia:  Resume statin.    Stroke:  Resume plavix and statin.    Mild acute metabolic encephalopathy : - probably from poor oral intake and dehydration and AKI.  - she is alert and answering simple questions but very confused.  - MRI brain without contrast ordered for further  evaluation.    COVID 19 infection  last week:  Pt denies any chest pain or sob.  No hypoxia.CXR shows pneumonitis. Will add decadron to the regimen, check CRP.    DVT prophylaxis: (Lovenox) Code Status: (DNR) Family Communication: (NONE AT BEDSIDE), discussed with daughter over the phone.  Disposition:   Status is: inpatient.   The patient will require care spanning > 2 midnights and should be moved to inpatient because: IV fluids.       Consultants:  Palliative care consult.   Procedures: none.   Antimicrobials:  Antibiotics Given (last 72 hours)     None         Subjective: No new complaints.   Objective: Vitals:   06/06/21 1454 06/06/21 2056 06/07/21 0647 06/07/21 0910  BP: (!) 143/46 (!) 141/86 (!) 152/64 (!) 143/79  Pulse: 78 85 77   Resp: 16 20 20 19   Temp: 98.5 F (36.9 C) 98.2 F (36.8 C) 98.2 F (36.8 C) 98.8 F (37.1 C)  TempSrc: Oral Oral Oral   SpO2: 96% 94% 97% 96%    Intake/Output Summary (Last 24 hours) at 06/07/2021 1826 Last data filed at 06/07/2021 1741 Gross per 24 hour  Intake 896.6 ml  Output 1200 ml  Net -303.4 ml    There were no vitals filed for this visit.  Examination:  General exam: Appears calm and comfortable  Respiratory system: Clear to auscultation. Respiratory effort normal. Cardiovascular system: S1 & S2 heard, RRR. No JVD, . No pedal edema.  Gastrointestinal system: Abdomen is nondistended, soft and nontender.  Normal bowel sounds heard. Central nervous system: Alert but confused.  Extremities: Symmetric 5 x 5 power. Skin: No rashes seen. Psychiatry: Mood & affect appropriate.       Data Reviewed: I have personally reviewed following labs and imaging studies  CBC: Recent Labs  Lab 06/04/21 1325 06/05/21 0455 06/06/21 0448  WBC 4.7 5.3 8.8  NEUTROABS 3.2  --  6.5  HGB 11.0* 9.7* 10.8*  HCT 34.1* 29.6* 32.7*  MCV 98.3 97.4 97.0  PLT 209 201 210     Basic Metabolic Panel: Recent Labs  Lab  06/04/21 1325 06/05/21 0455 06/06/21 0448 06/07/21 0518  NA 137 135 139 137  K 5.5* 4.8 5.2* 4.8  CL 106 107 108 110  CO2 25 23 21* 20*  GLUCOSE 108* 100* 97 125*  BUN 74* 59* 58* 52*  CREATININE 2.38* 2.19* 2.37* 1.93*  CALCIUM 9.1 8.6* 9.2 8.5*     GFR: CrCl cannot be calculated (Unknown ideal weight.).  Liver Function Tests: Recent Labs  Lab 06/04/21 1325  AST 17  ALT 14  ALKPHOS 78  BILITOT 0.5  PROT 7.4  ALBUMIN 3.8     CBG: No results for input(s): GLUCAP in the last 168 hours.   Recent Results (from the past 240 hour(s))  Resp Panel by RT-PCR (Flu A&B, Covid) Nasopharyngeal Swab     Status: Abnormal   Collection Time: 06/04/21  1:26 PM   Specimen: Nasopharyngeal Swab; Nasopharyngeal(NP) swabs in vial transport medium  Result Value Ref Range Status   SARS Coronavirus 2 by RT PCR POSITIVE (A) NEGATIVE Final    Comment: RESULT CALLED TO, READ BACK BY AND VERIFIED WITH: DANIEL,C. RN AT 1726 06/04/21 MULLINS,T (NOTE) SARS-CoV-2 target nucleic acids are DETECTED.  The SARS-CoV-2 RNA is generally detectable in upper respiratory specimens during the acute phase of infection. Positive results are indicative of the presence of the identified virus, but do not rule out bacterial infection or co-infection with other pathogens not detected by the test. Clinical correlation with patient history and other diagnostic information is necessary to determine patient infection status. The expected result is Negative.  Fact Sheet for Patients: EntrepreneurPulse.com.au  Fact Sheet for Healthcare Providers: IncredibleEmployment.be  This test is not yet approved or cleared by the [place] FDA and  has been authorized for detection and/or diagnosis of SARS-CoV-2 by FDA under an Emergency Use Authorization (EUA).  This EUA will remain in effect (meaning this test  can be used) for the duration of  the COVID-19 declaration under  Section 564(b)(1) of the Act, 21 U.S.C. section 360bbb-3(b)(1), unless the authorization is terminated or revoked sooner.     Influenza A by PCR NEGATIVE NEGATIVE Final   Influenza B by PCR NEGATIVE NEGATIVE Final    Comment: (NOTE) The Xpert Xpress SARS-CoV-2/FLU/RSV plus assay is intended as an aid in the diagnosis of influenza from Nasopharyngeal swab specimens and should not be used as a sole basis for treatment. Nasal washings and aspirates are unacceptable for Xpert Xpress SARS-CoV-2/FLU/RSV testing.  Fact Sheet for Patients: EntrepreneurPulse.com.au  Fact Sheet for Healthcare Providers: IncredibleEmployment.be  This test is not yet approved or cleared by the [place] FDA and has been authorized for detection and/or diagnosis of SARS-CoV-2 by FDA under an Emergency Use Authorization (EUA). This EUA will remain in effect (meaning this test can be used) for the duration of the COVID-19 declaration under Section 564(b)(1) of the Act, 21 U.S.C. section  360bbb-3(b)(1), unless the authorization is terminated or revoked.  Performed at [facility], [place] [address] [place], [place] [address]           Radiology Studies: US RENAL  Result Date: 06/06/2021 CLINICAL DATA:  Acute kidney injury EXAM: RENAL / URINARY TRACT ULTRASOUND COMPLETE COMPARISON:  CT abdomen pelvis 04/19/2018, ultrasound 01/09/2021 FINDINGS: Right Kidney: Renal measurements: 8.1 x 4.8 x 3.5 cm = volume: 71 mL. Renal cortical thinning. Echogenicity increased. There is a 2.1 cm simple cystic lesion. No associated mural nodularity, vascularity, septation. No mass or hydronephrosis visualized. Left Kidney: Renal measurements: 9.8 x 4.4 x 5 = volume: 107 mL. Renal cortical thinning. Echogenicity increased. No mass or hydronephrosis visualized. Urinary bladder: Urinary bladder diverticula. Appears normal for degree of bladder distention. Prevoid volume of  673 mL. Other: None. IMPRESSION: 1. Urinary bladder diverticula. 2. Increased renal parenchymal echogenicity suggestive of renal parenchymal disease bilaterally. Electronically Signed   By: Iven Finn M.D.   On: 06/06/2021 15:14   DG CHEST PORT 1 VIEW  Result Date: 06/06/2021 CLINICAL DATA:  Cough, COVID positive EXAM: PORTABLE CHEST 1 VIEW COMPARISON:  Previous studies including the examination of 01/07/2021 FINDINGS: There is poor inspiration. There are no signs of alveolar pulmonary edema. Increased markings are seen in both lower lung fields. Central pulmonary vessels are prominent without signs of alveolar pulmonary edema. There is no significant pleural effusion or pneumothorax. IMPRESSION: Poor inspiration. Increased markings are seen in both lower lung fields suggesting atelectasis/pneumonitis. Part of this finding may be due to poor inspiration. Central pulmonary vessels are prominent without signs of alveolar pulmonary edema. Electronically Signed   By: Elmer Picker M.D.   On: 06/06/2021 10:51        Scheduled Meds:  atorvastatin  80 mg Oral Daily   buPROPion ER  100 mg Oral BID   Chlorhexidine Gluconate Cloth  6 each Topical Daily   clopidogrel  75 mg Oral QPM   dexamethasone (DECADRON) injection  6 mg Intravenous Daily   diltiazem  180 mg Oral Daily   enoxaparin (LOVENOX) injection  30 mg Subcutaneous QHS   escitalopram  10 mg Oral Daily   loratadine  10 mg Oral Daily   pantoprazole  40 mg Oral Daily   traZODone  100 mg Oral QHS   vitamin B-12  500 mcg Oral Daily   Continuous Infusions:  sodium chloride 75 mL/hr at 06/07/21 0909     LOS: 2 days        Hosie Poisson, MD [facility]   To contact the attending provider between 7A-7P or the covering provider during after hours 7P-7A, please log into the web site [email or web address] and access using universal [place] password for that web site. If you do not have the password, please call the hospital  operator.  06/07/2021, 6:26 PM

## 2021-06-07 NOTE — Progress Notes (Addendum)
   06/07/21 2056  Vitals  Temp 98 F (36.7 C)  Temp Source Oral  BP (!) 166/57  MAP (mmHg) 86  BP Location Left Arm  BP Method Automatic  Patient Position (if appropriate) Lying  Pulse Rate 80  Pulse Rate Source Monitor  Resp 16  MEWS COLOR  MEWS Score Color Green  Oxygen Therapy  SpO2 94 %  O2 Device Room Air  MEWS Score  MEWS Temp 0  MEWS Systolic 0  MEWS Pulse 0  MEWS RR 0  MEWS LOC 0  MEWS Score 0  Notified on call [place], waiting for reply. Continue to monitor and assess.  Replied no new orders at this time.

## 2021-06-08 ENCOUNTER — Other Ambulatory Visit: Payer: Self-pay

## 2021-06-08 DIAGNOSIS — Z7189 Other specified counseling: Secondary | ICD-10-CM

## 2021-06-08 DIAGNOSIS — N1832 Chronic kidney disease, stage 3b: Secondary | ICD-10-CM | POA: Diagnosis not present

## 2021-06-08 DIAGNOSIS — N179 Acute kidney failure, unspecified: Secondary | ICD-10-CM | POA: Diagnosis not present

## 2021-06-08 DIAGNOSIS — Z515 Encounter for palliative care: Secondary | ICD-10-CM

## 2021-06-08 DIAGNOSIS — I10 Essential (primary) hypertension: Secondary | ICD-10-CM | POA: Diagnosis not present

## 2021-06-08 DIAGNOSIS — G9341 Metabolic encephalopathy: Secondary | ICD-10-CM

## 2021-06-08 LAB — BASIC METABOLIC PANEL
Anion gap: 5 (ref 5–15)
BUN: 46 mg/dL — ABNORMAL HIGH (ref 8–23)
CO2: 22 mmol/L (ref 22–32)
Calcium: 8.4 mg/dL — ABNORMAL LOW (ref 8.9–10.3)
Chloride: 110 mmol/L (ref 98–111)
Creatinine, Ser: 1.58 mg/dL — ABNORMAL HIGH (ref 0.44–1.00)
GFR, Estimated: 32 mL/min — ABNORMAL LOW (ref >60)
Glucose, Bld: 119 mg/dL — ABNORMAL HIGH (ref 70–99)
Potassium: 3.9 mmol/L (ref 3.5–5.1)
Sodium: 137 mmol/L (ref 135–145)

## 2021-06-08 NOTE — Progress Notes (Signed)
PROGRESS NOTE    Carol Page  YBO:[id number] DOB: [birth year] DOA: 06/04/2021 PCP: Jearld Fenton, NP    Chief Complaint  Patient presents with   Altered Mental Status    Brief Narrative:  Carol Page is a 85 y.o. female with medical history significant of seasonal allergies, osteoarthritis, cellulitis of the left leg, depression, hyperlipidemia, hypertension, prediabetes, proctitis, nasal skin cancer, history of other non-hemorrhagic stroke, stage 3b CKD, urinary incontinence who is coming to the emergency department via EMS from home due to confusion and hallucinations for the past 2 to 3 days. CT head without contrast did not show any acute abnormality.  There was chronic small vessel disease.    Assessment & Plan:   Principal Problem:   AKI (acute kidney injury) ([place]) Active Problems:   Essential hypertension   HLD (hyperlipidemia)   Pre-diabetes   Anxiety and depression   CKD (chronic kidney disease) stage 3, GFR 30-59 ml/min (HCC)   Iron deficiency anemia   Hyperkalemia   AKI: Secondary to poor oral intake/ dehydration.  Baseline creatinine between 1.3 to 1.5  Admitted with a creatinine of 2.38, started on IV fluids and creatinine has improved to 2.19,  to 1.9. repeat BMP in am.  Continue with gentle hydration.  US renal does not show any hydronephrosis.     Hyperkalemia:  Probably from dehydration:  Resolved with IV fluids and lokelma.     Mild rhabdomyolysis:  Gently hydrate    Anemia of chronic disease;  Baseline creatinine around 9.    Hypertension:  Well controlled. BP parameters are optimal.    Stage 3a CKD:  Baseline creatinine at 1.36.     Anxiety and Depression:  Resume home meds.   Hyperlipidemia:  Resume statin.    Stroke:  Resume plavix and statin.    Mild acute metabolic encephalopathy : - probably from poor oral intake and dehydration and AKI.  - she is alert and answering simple questions but very confused.  - MRI  brain without contrast ordered for further evaluation.    COVID 19 infection  last week:  Pt denies any chest pain or sob.  No hypoxia.CXR shows pneumonitis. Will add decadron to the regimen,  CRP wnl.    DVT prophylaxis: (Lovenox) Code Status: (DNR) Family Communication: (NONE AT BEDSIDE), discussed with daughter over the phone.  Disposition:   Status is: inpatient.   The patient will require care spanning > 2 midnights and should be moved to inpatient because: IV fluids.       Consultants:  Palliative care consult.   Procedures: none.   Antimicrobials:  Antibiotics Given (last 72 hours)     None       Subjective: No new complaints.   Objective: Vitals:   06/07/21 0910 06/07/21 2056 06/08/21 0533 06/08/21 0843  BP: (!) 143/79 (!) 166/57 (!) 155/64 (!) 158/67  Pulse:  80 76   Resp: 19 16 16    Temp: 98.8 F (37.1 C) 98 F (36.7 C) 97.6 F (36.4 C)   TempSrc:  Oral Oral   SpO2: 96% 94% 98%     Intake/Output Summary (Last 24 hours) at 06/08/2021 2778 Last data filed at 06/07/2021 2100 Gross per 24 hour  Intake 490 ml  Output --  Net 490 ml    There were no vitals filed for this visit.  Examination:  General exam: Appears calm and comfortable  Respiratory system: Clear to auscultation. Respiratory effort normal. Cardiovascular system: S1 & S2 heard, RRR. No JVD,  No pedal edema. Gastrointestinal system: Abdomen is nondistended, soft and nontender. Normal bowel sounds heard. Central nervous system: Alert and oriented to person only.  Extremities: Symmetric 5 x 5 power. Skin: No rashes, lesions or ulcers Psychiatry: Mood & affect appropriate.       Data Reviewed: I have personally reviewed following labs and imaging studies  CBC: Recent Labs  Lab 06/04/21 1325 06/05/21 0455 06/06/21 0448  WBC 4.7 5.3 8.8  NEUTROABS 3.2  --  6.5  HGB 11.0* 9.7* 10.8*  HCT 34.1* 29.6* 32.7*  MCV 98.3 97.4 97.0  PLT 209 201 210     Basic Metabolic  Panel: Recent Labs  Lab 06/04/21 1325 06/05/21 0455 06/06/21 0448 06/07/21 0518  NA 137 135 139 137  K 5.5* 4.8 5.2* 4.8  CL 106 107 108 110  CO2 25 23 21* 20*  GLUCOSE 108* 100* 97 125*  BUN 74* 59* 58* 52*  CREATININE 2.38* 2.19* 2.37* 1.93*  CALCIUM 9.1 8.6* 9.2 8.5*     GFR: CrCl cannot be calculated (Unknown ideal weight.).  Liver Function Tests: Recent Labs  Lab 06/04/21 1325  AST 17  ALT 14  ALKPHOS 78  BILITOT 0.5  PROT 7.4  ALBUMIN 3.8     CBG: No results for input(s): GLUCAP in the last 168 hours.   Recent Results (from the past 240 hour(s))  Resp Panel by RT-PCR (Flu A&B, Covid) Nasopharyngeal Swab     Status: Abnormal   Collection Time: 06/04/21  1:26 PM   Specimen: Nasopharyngeal Swab; Nasopharyngeal(NP) swabs in vial transport medium  Result Value Ref Range Status   SARS Coronavirus 2 by RT PCR POSITIVE (A) NEGATIVE Final    Comment: RESULT CALLED TO, READ BACK BY AND VERIFIED WITH: DANIEL,C. RN AT 1726 06/04/21 MULLINS,T (NOTE) SARS-CoV-2 target nucleic acids are DETECTED.  The SARS-CoV-2 RNA is generally detectable in upper respiratory specimens during the acute phase of infection. Positive results are indicative of the presence of the identified virus, but do not rule out bacterial infection or co-infection with other pathogens not detected by the test. Clinical correlation with patient history and other diagnostic information is necessary to determine patient infection status. The expected result is Negative.  Fact Sheet for Patients: EntrepreneurPulse.com.au  Fact Sheet for Healthcare Providers: IncredibleEmployment.be  This test is not yet approved or cleared by the [place] FDA and  has been authorized for detection and/or diagnosis of SARS-CoV-2 by FDA under an Emergency Use Authorization (EUA).  This EUA will remain in effect (meaning this test  can be used) for the duration of  the  COVID-19 declaration under Section 564(b)(1) of the Act, 21 U.S.C. section 360bbb-3(b)(1), unless the authorization is terminated or revoked sooner.     Influenza A by PCR NEGATIVE NEGATIVE Final   Influenza B by PCR NEGATIVE NEGATIVE Final    Comment: (NOTE) The Xpert Xpress SARS-CoV-2/FLU/RSV plus assay is intended as an aid in the diagnosis of influenza from Nasopharyngeal swab specimens and should not be used as a sole basis for treatment. Nasal washings and aspirates are unacceptable for Xpert Xpress SARS-CoV-2/FLU/RSV testing.  Fact Sheet for Patients: EntrepreneurPulse.com.au  Fact Sheet for Healthcare Providers: IncredibleEmployment.be  This test is not yet approved or cleared by the [place] FDA and has been authorized for detection and/or diagnosis of SARS-CoV-2 by FDA under an Emergency Use Authorization (EUA). This EUA will remain in effect (meaning this test can be used) for the duration of the COVID-19 declaration under Section  564(b)(1) of the Act, 21 U.S.C. section 360bbb-3(b)(1), unless the authorization is terminated or revoked.  Performed at [facility], [place] [address] [place], [place] [address]           Radiology Studies: US RENAL  Result Date: 06/06/2021 CLINICAL DATA:  Acute kidney injury EXAM: RENAL / URINARY TRACT ULTRASOUND COMPLETE COMPARISON:  CT abdomen pelvis 04/19/2018, ultrasound 01/09/2021 FINDINGS: Right Kidney: Renal measurements: 8.1 x 4.8 x 3.5 cm = volume: 71 mL. Renal cortical thinning. Echogenicity increased. There is a 2.1 cm simple cystic lesion. No associated mural nodularity, vascularity, septation. No mass or hydronephrosis visualized. Left Kidney: Renal measurements: 9.8 x 4.4 x 5 = volume: 107 mL. Renal cortical thinning. Echogenicity increased. No mass or hydronephrosis visualized. Urinary bladder: Urinary bladder diverticula. Appears normal for degree of bladder  distention. Prevoid volume of 673 mL. Other: None. IMPRESSION: 1. Urinary bladder diverticula. 2. Increased renal parenchymal echogenicity suggestive of renal parenchymal disease bilaterally. Electronically Signed   By: Iven Finn M.D.   On: 06/06/2021 15:14   DG CHEST PORT 1 VIEW  Result Date: 06/06/2021 CLINICAL DATA:  Cough, COVID positive EXAM: PORTABLE CHEST 1 VIEW COMPARISON:  Previous studies including the examination of 01/07/2021 FINDINGS: There is poor inspiration. There are no signs of alveolar pulmonary edema. Increased markings are seen in both lower lung fields. Central pulmonary vessels are prominent without signs of alveolar pulmonary edema. There is no significant pleural effusion or pneumothorax. IMPRESSION: Poor inspiration. Increased markings are seen in both lower lung fields suggesting atelectasis/pneumonitis. Part of this finding may be due to poor inspiration. Central pulmonary vessels are prominent without signs of alveolar pulmonary edema. Electronically Signed   By: Elmer Picker M.D.   On: 06/06/2021 10:51        Scheduled Meds:  atorvastatin  80 mg Oral Daily   buPROPion ER  100 mg Oral BID   Chlorhexidine Gluconate Cloth  6 each Topical Daily   clopidogrel  75 mg Oral QPM   dexamethasone (DECADRON) injection  6 mg Intravenous Daily   diltiazem  180 mg Oral Daily   enoxaparin (LOVENOX) injection  30 mg Subcutaneous QHS   escitalopram  10 mg Oral Daily   loratadine  10 mg Oral Daily   pantoprazole  40 mg Oral Daily   traZODone  100 mg Oral QHS   vitamin B-12  500 mcg Oral Daily   Continuous Infusions:  sodium chloride 75 mL/hr at 06/08/21 0012     LOS: 3 days        Hosie Poisson, MD [facility]   To contact the attending provider between 7A-7P or the covering provider during after hours 7P-7A, please log into the web site [email or web address] and access using universal [place] password for that web site. If you do not have the  password, please call the hospital operator.  06/08/2021, 9:03 AM

## 2021-06-09 DIAGNOSIS — F32A Depression, unspecified: Secondary | ICD-10-CM

## 2021-06-09 LAB — BASIC METABOLIC PANEL
Anion gap: 6 (ref 5–15)
BUN: 45 mg/dL — ABNORMAL HIGH (ref 8–23)
CO2: 21 mmol/L — ABNORMAL LOW (ref 22–32)
Calcium: 8.4 mg/dL — ABNORMAL LOW (ref 8.9–10.3)
Chloride: 112 mmol/L — ABNORMAL HIGH (ref 98–111)
Creatinine, Ser: 1.41 mg/dL — ABNORMAL HIGH (ref 0.44–1.00)
GFR, Estimated: 37 mL/min — ABNORMAL LOW (ref >60)
Glucose, Bld: 95 mg/dL (ref 70–99)
Potassium: 4 mmol/L (ref 3.5–5.1)
Sodium: 139 mmol/L (ref 135–145)

## 2021-06-09 MED ORDER — TAMSULOSIN HCL 0.4 MG PO CAPS
0.4000 mg | ORAL_CAPSULE | Freq: Every day | ORAL | Status: DC
  Administered 2021-06-09 – 2021-06-13 (×5): 0.4 mg via ORAL
  Filled 2021-06-09 (×5): qty 1

## 2021-06-09 NOTE — Progress Notes (Signed)
PROGRESS NOTE    Carol Page  BOF:[id number] DOB: 1936/07/10 DOA: 06/04/2021 PCP: Jearld Fenton, NP    Chief Complaint  Patient presents with   Altered Mental Status    Brief Narrative:  Carol Page is a 85 y.o. female with medical history significant of seasonal allergies, osteoarthritis, cellulitis of the left leg, depression, hyperlipidemia, hypertension, prediabetes, proctitis, nasal skin cancer, history of other non-hemorrhagic stroke, stage 3b CKD, urinary incontinence who is coming to the emergency department via EMS from home due to confusion and hallucinations for the past 2 to 3 days. CT head without contrast did not show any acute abnormality.  There was chronic small vessel disease. MRI of the brain without contrast did not show any acute stroke.  Pt continues to be confused, probably worsening of her dementia. Palliative care consulted after speaking with her daughter.    Assessment & Plan:   Principal Problem:   AKI (acute kidney injury) ([place]) Active Problems:   Essential hypertension   HLD (hyperlipidemia)   Pre-diabetes   Anxiety and depression   CKD (chronic kidney disease) stage 3, GFR 30-59 ml/min (HCC)   Iron deficiency anemia   Hyperkalemia   AKI: Secondary to poor oral intake/ dehydration.  Baseline creatinine between 1.3 to 1.5  Admitted with a creatinine of 2.38, started on IV fluids and creatinine has improved to 2.19,  to 1.9 to 1.4 today.  US renal does not show any hydronephrosis.      Acute Urinary retention:  Foley catheter placed after 3 in and out cath insertion.  She was started on flomax and voiding trial today.    Hyperkalemia:  Probably from dehydration:  Resolved with IV fluids and lokelma.     Mild rhabdomyolysis:  Gently hydrate    Anemia of chronic disease;  Baseline creatinine around 9.    Hypertension:  Suboptimally controlled. Resume cardizem 180 mg daily.  Restart the patient on lisinopril 5 mg daily.    Anemia of chronic disease:  Hemoglobin stable around 10.  Continue to monitor.    Stage 3a CKD:  Baseline creatinine at 1.3.     Anxiety and Depression:  Resume home meds.   Hyperlipidemia:  Resume statin.    Prior history of Stroke:  Resume plavix and statin.    Mild acute metabolic encephalopathy in the setting of underlying dementia.  - probably from poor oral intake and dehydration and AKI.  - she is alert and answering simple questions but very confused.  - MRI brain without contrast ordered , negative for acute stroke.  - VITAMIN B 12 elevated at 2989. TSH wnl. Folate wnl.  - she will outpatient follow up with neurology and psychiatry for hallucinations.    COVID 19 infection  last week:  Pt denies any chest pain or sob.  No hypoxia.CXR shows pneumonitis. Pt received 5 days of decadron.  CRP wnl.    DVT prophylaxis: (Lovenox) Code Status: (DNR) Family Communication: (NONE AT BEDSIDE), discussed with daughter over the phone.  Disposition:   Status is: inpatient.   The patient will require care spanning > 2 midnights and should be moved to inpatient because: unsafe d/c plan.       Consultants:  Palliative care consult.   Procedures: none.   Antimicrobials:  Antibiotics Given (last 72 hours)     None       Subjective: Confused.   Objective: Vitals:   06/08/21 0843 06/08/21 1353 06/08/21 2108 06/09/21 0657  BP: (!) 158/67 (!) 129/112 Marland Kitchen)  180/79 (!) 163/70  Pulse:  76 77 76  Resp:  17 16 16   Temp:  98 F (36.7 C) 98.3 F (36.8 C) (!) 97.4 F (36.3 C)  TempSrc:  Oral Oral Oral  SpO2:  96% 95% 96%    Intake/Output Summary (Last 24 hours) at 06/09/2021 1502 Last data filed at 06/09/2021 1000 Gross per 24 hour  Intake --  Output 1350 ml  Net -1350 ml    There were no vitals filed for this visit.  Examination:  General exam: Appears calm and comfortable  Respiratory system: Clear to auscultation. Respiratory effort  normal. Cardiovascular system: S1 & S2 heard, RRR. No JVD, No pedal edema. Gastrointestinal system: Abdomen is nondistended, soft and nontender. . Normal bowel sounds heard. Central nervous system: Alert , but confused.  Extremities: Symmetric 5 x 5 power. Skin: No rashes, lesions or ulcers Psychiatry:  Mood & affect appropriate.        Data Reviewed: I have personally reviewed following labs and imaging studies  CBC: Recent Labs  Lab 06/04/21 1325 06/05/21 0455 06/06/21 0448  WBC 4.7 5.3 8.8  NEUTROABS 3.2  --  6.5  HGB 11.0* 9.7* 10.8*  HCT 34.1* 29.6* 32.7*  MCV 98.3 97.4 97.0  PLT 209 201 210     Basic Metabolic Panel: Recent Labs  Lab 06/05/21 0455 06/06/21 0448 06/07/21 0518 06/08/21 0909 06/09/21 0908  NA 135 139 137 137 139  K 4.8 5.2* 4.8 3.9 4.0  CL 107 108 110 110 112*  CO2 23 21* 20* 22 21*  GLUCOSE 100* 97 125* 119* 95  BUN 59* 58* 52* 46* 45*  CREATININE 2.19* 2.37* 1.93* 1.58* 1.41*  CALCIUM 8.6* 9.2 8.5* 8.4* 8.4*     GFR: CrCl cannot be calculated (Unknown ideal weight.).  Liver Function Tests: Recent Labs  Lab 06/04/21 1325  AST 17  ALT 14  ALKPHOS 78  BILITOT 0.5  PROT 7.4  ALBUMIN 3.8     CBG: No results for input(s): GLUCAP in the last 168 hours.   Recent Results (from the past 240 hour(s))  Resp Panel by RT-PCR (Flu A&B, Covid) Nasopharyngeal Swab     Status: Abnormal   Collection Time: 06/04/21  1:26 PM   Specimen: Nasopharyngeal Swab; Nasopharyngeal(NP) swabs in vial transport medium  Result Value Ref Range Status   SARS Coronavirus 2 by RT PCR POSITIVE (A) NEGATIVE Final    Comment: RESULT CALLED TO, READ BACK BY AND VERIFIED WITH: DANIEL,C. RN AT 1726 06/04/21 MULLINS,T (NOTE) SARS-CoV-2 target nucleic acids are DETECTED.  The SARS-CoV-2 RNA is generally detectable in upper respiratory specimens during the acute phase of infection. Positive results are indicative of the presence of the identified virus, but do  not rule out bacterial infection or co-infection with other pathogens not detected by the test. Clinical correlation with patient history and other diagnostic information is necessary to determine patient infection status. The expected result is Negative.  Fact Sheet for Patients: EntrepreneurPulse.com.au  Fact Sheet for Healthcare Providers: IncredibleEmployment.be  This test is not yet approved or cleared by the [place] FDA and  has been authorized for detection and/or diagnosis of SARS-CoV-2 by FDA under an Emergency Use Authorization (EUA).  This EUA will remain in effect (meaning this test  can be used) for the duration of  the COVID-19 declaration under Section 564(b)(1) of the Act, 21 U.S.C. section 360bbb-3(b)(1), unless the authorization is terminated or revoked sooner.     Influenza A by PCR NEGATIVE  NEGATIVE Final   Influenza B by PCR NEGATIVE NEGATIVE Final    Comment: (NOTE) The Xpert Xpress SARS-CoV-2/FLU/RSV plus assay is intended as an aid in the diagnosis of influenza from Nasopharyngeal swab specimens and should not be used as a sole basis for treatment. Nasal washings and aspirates are unacceptable for Xpert Xpress SARS-CoV-2/FLU/RSV testing.  Fact Sheet for Patients: EntrepreneurPulse.com.au  Fact Sheet for Healthcare Providers: IncredibleEmployment.be  This test is not yet approved or cleared by the [place] FDA and has been authorized for detection and/or diagnosis of SARS-CoV-2 by FDA under an Emergency Use Authorization (EUA). This EUA will remain in effect (meaning this test can be used) for the duration of the COVID-19 declaration under Section 564(b)(1) of the Act, 21 U.S.C. section 360bbb-3(b)(1), unless the authorization is terminated or revoked.  Performed at [facility], [place] [address], [place], [place] [address]           Radiology  Studies: MR BRAIN WO CONTRAST  Result Date: 06/08/2021 CLINICAL DATA:  This study identified missing a report at 9:47 am on 06/08/2021. 85 year old female with unexplained altered mental status. EXAM: MRI HEAD WITHOUT CONTRAST TECHNIQUE: Multiplanar, multiecho pulse sequences of the brain and surrounding structures were obtained without intravenous contrast. COMPARISON:  Head CTs 06/04/2021 and earlier.  Brain MRI 04/26/2018. FINDINGS: Study is moderately degraded by motion artifact despite repeated imaging attempts. Brain: No restricted diffusion to suggest acute infarction. No midline shift, mass effect, evidence of mass lesion, ventriculomegaly, extra-axial collection or acute intracranial hemorrhage. Cervicomedullary junction and pituitary are within normal limits. Cerebral volume not significantly changed since 2019. Patchy and confluent bilateral cerebral white matter T2 and FLAIR hyperintensity also appears stable. Small perivascular spaces versus chronic lacunar infarcts in the bilateral globus pallidus. No definite cortical encephalomalacia or chronic cerebral blood products. Vascular: Major intracranial vascular flow voids appear stable. Skull and upper cervical spine: Grossly stable. No acute osseous abnormality identified. Sinuses/Orbits: Orbits appear stable, negative. Mild paranasal sinus inflammation including a small right sphenoid sinus fluid level, new since 2019. Other: But mild bilateral mastoid air cell effusions are chronic and not significantly changed since 2019. Grossly negative visible scalp and face. IMPRESSION: 1. Moderately degraded by motion despite repeated imaging attempts. 2. No acute intracranial abnormality, grossly stable noncontrast MRI appearance of the brain since 2019. Electronically Signed   By: Genevie Ann M.D.   On: 06/08/2021 09:51        Scheduled Meds:  atorvastatin  80 mg Oral Daily   buPROPion ER  100 mg Oral BID   Chlorhexidine Gluconate Cloth  6 each Topical  Daily   clopidogrel  75 mg Oral QPM   diltiazem  180 mg Oral Daily   enoxaparin (LOVENOX) injection  30 mg Subcutaneous QHS   escitalopram  10 mg Oral Daily   loratadine  10 mg Oral Daily   pantoprazole  40 mg Oral Daily   tamsulosin  0.4 mg Oral Daily   traZODone  100 mg Oral QHS   vitamin B-12  500 mcg Oral Daily   Continuous Infusions:     LOS: 4 days        Hosie Poisson, MD [facility]   To contact the attending provider between 7A-7P or the covering provider during after hours 7P-7A, please log into the web site [email or web address] and access using universal [place] password for that web site. If you do not have the password, please call the hospital operator.  06/09/2021, 3:02 PM

## 2021-06-09 NOTE — NC FL2 (Signed)
[place] LEVEL OF CARE SCREENING TOOL     IDENTIFICATION  Patient Name: Carol Page Birthdate: Nov 21, 1935 Sex: female Admission Date (Current Location): 06/04/2021  [facility] and [place] Number:  Herbalist and Address:  [facility],  [place], [place]      Provider Number: [id number]  Attending Physician Name and Address:  Hosie Poisson, MD  Relative Name and Phone Number:  [name] (Daughter)   [phone]    Current Level of Care: Hospital Recommended Level of Care: [place] Prior Approval Number:    Date Approved/Denied:   PASRR Number: [id number]  Discharge Plan: SNF    Current Diagnoses: Patient Active Problem List   Diagnosis Date Noted   AKI (acute kidney injury) ([place]) 06/04/2021   Hyperkalemia    CKD (chronic kidney disease) stage 4, GFR 15-29 ml/min ([place]) 03/26/2021   Iron deficiency anemia 02/18/2021   Morbid obesity ([place]) 01/17/2021   Aortic atherosclerosis ([place]) 01/17/2021   CKD (chronic kidney disease) stage 3, GFR 30-59 ml/min ([place]) 01/08/2021   Obesity, Class III, BMI 40-49.9 (morbid obesity) ([place]) 01/08/2021   Anxiety and depression 10/06/2013   Urinary incontinence 10/06/2013   Chronic leg pain 10/06/2013   History of CVA (cerebrovascular accident) 10/06/2013   Essential hypertension 11/09/2012   HLD (hyperlipidemia) 11/09/2012   Pre-diabetes 11/09/2012   Seizure disorder ([place]) 11/09/2012    Orientation RESPIRATION BLADDER Height & Weight     Self, Place  Normal Incontinent Weight:   Height:     BEHAVIORAL SYMPTOMS/MOOD NEUROLOGICAL BOWEL NUTRITION STATUS      Incontinent Diet (see d/c summary)  AMBULATORY STATUS COMMUNICATION OF NEEDS Skin   Extensive Assist Verbally Normal                       Personal Care Assistance Level of Assistance  Bathing, Feeding, Dressing Bathing Assistance: Maximum assistance Feeding assistance: Limited assistance Dressing  Assistance: Maximum assistance     Functional Limitations Info  Sight, Hearing, Speech Sight Info: Adequate Hearing Info: Impaired Speech Info: Adequate    SPECIAL CARE FACTORS FREQUENCY  PT (By licensed PT), OT (By licensed OT)     PT Frequency: 5X/W OT Frequency: 5X/W            Contractures Contractures Info: Not present    Additional Factors Info  Code Status, Allergies Code Status Info: DNR Allergies Info: Codeine, Ether           Current Medications (06/09/2021):  This is the current hospital active medication list Current Facility-Administered Medications  Medication Dose Route Frequency Provider Last Rate Last Admin   acetaminophen (TYLENOL) tablet 650 mg  650 mg Oral Q6H PRN Reubin Milan, MD   650 mg at 06/08/21 2145   Or   acetaminophen (TYLENOL) suppository 650 mg  650 mg Rectal Q6H PRN Reubin Milan, MD       atorvastatin (LIPITOR) tablet 80 mg  80 mg Oral Daily Reubin Milan, MD   80 mg at 06/09/21 1036   buPROPion ER [facility] SR) 12 hr tablet 100 mg  100 mg Oral BID Reubin Milan, MD   100 mg at 06/08/21 2145   Chlorhexidine Gluconate Cloth 2 % PADS 6 each  6 each Topical Daily Hosie Poisson, MD   6 each at 06/09/21 1037   clopidogrel (PLAVIX) tablet 75 mg  75 mg Oral QPM Reubin Milan, MD   75 mg at 06/08/21 1754  dexamethasone (DECADRON) injection 6 mg  6 mg Intravenous Daily Hosie Poisson, MD   6 mg at 06/09/21 1037   diltiazem (CARDIZEM CD) 24 hr capsule 180 mg  180 mg Oral Daily Reubin Milan, MD   180 mg at 06/09/21 1036   enoxaparin (LOVENOX) injection 30 mg  30 mg Subcutaneous QHS Reubin Milan, MD   30 mg at 06/08/21 2144   escitalopram (LEXAPRO) tablet 10 mg  10 mg Oral Daily Reubin Milan, MD   10 mg at 06/09/21 1037   hydrALAZINE (APRESOLINE) tablet 50 mg  50 mg Oral Q6H PRN Reubin Milan, MD   50 mg at 06/09/21 0220   loratadine (CLARITIN) tablet 10 mg  10 mg Oral Daily Reubin Milan, MD   10 mg at 06/09/21 1037   ondansetron (ZOFRAN) tablet 4 mg  4 mg Oral Q6H PRN Reubin Milan, MD       Or   ondansetron [facility]) injection 4 mg  4 mg Intravenous Q6H PRN Reubin Milan, MD       pantoprazole (PROTONIX) EC tablet 40 mg  40 mg Oral Daily Reubin Milan, MD   40 mg at 06/09/21 1037   polyethylene glycol (MIRALAX / GLYCOLAX) packet 17 g  17 g Oral Daily PRN Reubin Milan, MD       tamsulosin [facility]) capsule 0.4 mg  0.4 mg Oral Daily Hosie Poisson, MD       traZODone (DESYREL) tablet 100 mg  100 mg Oral QHS Reubin Milan, MD   100 mg at 06/08/21 2145   vitamin B-12 (CYANOCOBALAMIN) tablet 500 mcg  500 mcg Oral Daily Reubin Milan, MD   500 mcg at 06/09/21 1036     Discharge Medications: Please see discharge summary for a list of discharge medications.  Relevant Imaging Results:  Relevant Lab Results:   Additional Information SS#[id number] [place], [place]

## 2021-06-09 NOTE — Care Management Important Message (Signed)
Important Message  Patient Details IM Letter placed in Patients room. Name: Carol Page MRN: [id number] Date of Birth: [birth year]   Medicare Important Message Given:  Yes     Kerin Salen 06/09/2021, 10:47 AM

## 2021-06-09 NOTE — TOC Initial Note (Signed)
Transition of Care [facility]) - Initial/Assessment Note    Patient Details  Name: Carol Page MRN: [id number] Date of Birth: 1936-01-13  [facility]) CM/SW Contact:    Carol Mage, LCSW Phone Number: 06/09/2021, 3:57 PM  Clinical Narrative:  Spoke with daughter as patient is currently confused.  Ms Carol Page states Ms Carol Page has been living with she and husband for the past 8 years, was ambulatory and doing all ADLs until a month ago or so. Was using DME and bedside commode. Ms Carol Page believes her current illness may be exacerbated by depression.  She was seen years ago by a psychiatrist for depression, but current anti-depressants  are being prescribed by PCP. Spoke with daughter about applying for MCD as a payor source for LTC if her current condition does not improve.  Bed search initiated. Ms Carol Page is positive for COVID, currently asymptomatic, can come off precautions 10 days from diagnosis on 11/25.  TOC will continue to follow during the course of hospitalization.                Expected Discharge Plan: Skilled Nursing Facility Barriers to Discharge: SNF Pending bed offer   Patient Goals and CMS Choice     Choice offered to / list presented to : Adult Children  Expected Discharge Plan and Services Expected Discharge Plan: [place]   Discharge Planning Services: CM Consult Post Acute Care Choice: [place] Living arrangements for the past 2 months: Single Family Home                                      Prior Living Arrangements/Services Living arrangements for the past 2 months: Single Family Home Lives with:: Adult Children Patient language and need for interpreter reviewed:: Yes        Need for Family Participation in Patient Care: Yes (Comment) Care giver support system in place?: Yes (comment) Current home services: DME Criminal Activity/Legal Involvement Pertinent to Current Situation/Hospitalization: No - Comment as  needed  Activities of Daily Living Home Assistive Devices/Equipment: Bedside commode/3-in-1, Walker (specify type), Other (Comment), Eyeglasses (4 wheeled walker, tub bench, tub/shower unit, standard height toilet) ADL Screening (condition at time of admission) Patient's cognitive ability adequate to safely complete daily activities?: No (patient with confusion and hallucinations) Is the patient deaf or have difficulty hearing?: No Does the patient have difficulty seeing, even when wearing glasses/contacts?: No Does the patient have difficulty concentrating, remembering, or making decisions?: Yes (patient with confusion and hallucinations) Patient able to express need for assistance with ADLs?: Yes Does the patient have difficulty dressing or bathing?: Yes (secondary to worsening weakness) Independently performs ADLs?: No (secondary to worsening weakness) Communication: Independent Dressing (OT): Needs assistance Is this a change from baseline?: Change from baseline, expected to last >3 days Grooming: Needs assistance Is this a change from baseline?: Change from baseline, expected to last >3 days Feeding: Needs assistance Is this a change from baseline?: Change from baseline, expected to last >3 days Bathing: Needs assistance Is this a change from baseline?: Change from baseline, expected to last >3 days Toileting: Needs assistance Is this a change from baseline?: Change from baseline, expected to last >3days In/Out Bed: Needs assistance Is this a change from baseline?: Change from baseline, expected to last >3 days Walks in Home: Needs assistance Is this a change from baseline?: Change from baseline, expected to last >3 days  Does the patient have difficulty walking or climbing stairs?: Yes (secondary to worsening weakness) Weakness of Legs: Both Weakness of Arms/Hands: None  Permission Sought/Granted Permission sought to share information with : Family Supports Permission granted to  share information with : No  Share Information with NAME: Carol Page (Daughter)   [phone]           Emotional Assessment       Orientation: : Oriented to Self Alcohol / Substance Use: Not Applicable Psych Involvement: No (comment)  Admission diagnosis:  Malaise [R53.81] Weakness [R53.1] AKI (acute kidney injury) ([place]) [N17.9] Depression, unspecified depression type [F32.A] Patient Active Problem List   Diagnosis Date Noted   AKI (acute kidney injury) ([place]) 06/04/2021   Hyperkalemia    CKD (chronic kidney disease) stage 4, GFR 15-29 ml/min (HCC) 03/26/2021   Iron deficiency anemia 02/18/2021   Morbid obesity ([place]) 01/17/2021   Aortic atherosclerosis ([place]) 01/17/2021   CKD (chronic kidney disease) stage 3, GFR 30-59 ml/min (H[facility]) 01/08/2021   Obesity, Class III, BMI 40-49.9 (morbid obesity) ([place]) 01/08/2021   Anxiety and depression 10/06/2013   Urinary incontinence 10/06/2013   Chronic leg pain 10/06/2013   History of CVA (cerebrovascular accident) 10/06/2013   Essential hypertension 11/09/2012   HLD (hyperlipidemia) 11/09/2012   Pre-diabetes 11/09/2012   Seizure disorder ([place]) 11/09/2012   PCP:  Carol Fenton, NP Pharmacy:   [facility] ([facility] [place] [address]  ([place]) [place] [address] Phone: [phone] Fax: [phone]     Social Determinants of Health (SDOH) Interventions    Readmission Risk Interventions No flowsheet data found.

## 2021-06-09 NOTE — Consult Note (Signed)
Consultation Note Date: 06/09/2021   Patient Name: Carol Page  DOB: [birth year]  MRN: [id number]  Age / Sex: 85 y.o., female  PCP: Jearld Fenton, NP Referring Physician: Hosie Poisson, MD  Reason for Consultation: Establishing goals of care  HPI/Patient Profile: 85 y.o. female  with past medical history of Allergies, osteoarthritis, cellulitis, depression, hyperlipidemia, hypertension, prediabetes, proctitis, nasal skin cancer, nonhemorrhagic stroke, CKD, urinary incontinence admitted on 06/04/2021 with confusion and hallucinations.  CT and MRI were unremarkable for acute process but showed chronic small vessel disease.  She is COVID-positive.  Mental status has not improved and she continues to have hallucinations, including bugs and other people in her room.  Palliative consulted for goals of care.  Clinical Assessment and Goals of Care: Palliative care consult received.  Chart reviewed including personal review of pertinent labs and imaging.  I saw and examined Ms. Bruun today.  She was lying in bed in no distress but had complaints of seeing bugs in the room.  She did not appear to be frightened by them, but she did asked me several times to clear them out.  She is able to tell me that she lives at home with her daughter and she denies any current complaints.  She does not have insight into her situation.  NEXT OF KIN: [name] [phone]    SUMMARY OF RECOMMENDATIONS   - DNR/DNI Standard delirium management (adapted from NICE guidelines 2011 for prevention of delirium):  Provide continuity of care when possible (avoid frequent changing of surroundings and staff).  Frequent reorientation to time with:  A clock should always be visible.  Make sure Calendar/white board is updated.  Lights on/blinds open during the day and off/closed at night.  Encourage frequent family visits.  Monitor for  and treat dehydration/constipation.  Optimize oxygen saturation.  Avoid catheters and IV's when possible and look for/treat infections.  Encourage early mobility.  Assess and treat pain.  Ensure adequate nutrition and functioning dentures.  Address reversible causes of hearing and visual impairment:  Use pocket talker if hearing aids are unavailable.  Avoid sleep disturbance (normalize sleep/wake cycle).  Minimize disturbances and consider NOT obtaining vitals at night if possible.  Review Medications to avoid polypharmacy and avoid deliriogenic medications when possible:  Benzodiazepines.  Dihydropyridines.  Antihistamines.  Anticholinergics.  (Possibly avoid: H2 blockers, tricyclic antidepressants, antiparkinson medications, steroids, NSAID's).  - Recommend continuing focus on modifiable factors above regarding confusion.  Would consider tapering steroids as soon as possible. - Call placed but I was unable to reach her daughter today.  Left number on voicemail requesting return call.  Code Status/Advance Care Planning: DNR  Psycho-social/Spiritual:  Desire for further Chaplaincy support: Did not assess today Additional Recommendations: Caregiving  Support/Resources  Prognosis:  Unable to determine  Discharge Planning: To Be Determined      Primary Diagnoses: Present on Admission:  AKI (acute kidney injury) ([place])  Anxiety and depression  CKD (chronic kidney disease) stage 3, GFR 30-59 ml/min (HCC)  Essential hypertension  HLD (hyperlipidemia)  Iron deficiency anemia  Pre-diabetes  Hyperkalemia   I have reviewed the medical record, interviewed the patient and family, and examined the patient. The following aspects are pertinent.  Past Medical History:  Diagnosis Date   Allergy    Arthritis    Cellulitis of left leg    Depression    History of blood transfusion    Hyperlipidemia    Hypertension    Pre-diabetes    Proctitis    Skin cancer of nose    Stroke  [facility])    Ulcer    Urinary incontinence    Social History   Socioeconomic History   Marital status: Widowed    Spouse name: Not on file   Number of children: 4   Years of education: 12   Highest education level: Not on file  Occupational History   Occupation: Retired  Tobacco Use   Smoking status: Never   Smokeless tobacco: Never  Substance and Sexual Activity   Alcohol use: No   Drug use: No   Sexual activity: Never  Other Topics Concern   Not on file  Social History Narrative   Regular exercise-no   Caffeine Use-yes   Social Determinants of Health   Financial Resource Strain: Not on file  Food Insecurity: Not on file  Transportation Needs: Not on file  Physical Activity: Not on file  Stress: Not on file  Social Connections: Not on file   Family History  Problem Relation Age of Onset   Heart disease Other        Parent   Cancer Maternal Uncle    Scheduled Meds:  atorvastatin  80 mg Oral Daily   buPROPion ER  100 mg Oral BID   Chlorhexidine Gluconate Cloth  6 each Topical Daily   clopidogrel  75 mg Oral QPM   dexamethasone (DECADRON) injection  6 mg Intravenous Daily   diltiazem  180 mg Oral Daily   enoxaparin (LOVENOX) injection  30 mg Subcutaneous QHS   escitalopram  10 mg Oral Daily   loratadine  10 mg Oral Daily   pantoprazole  40 mg Oral Daily   tamsulosin  0.4 mg Oral Daily   traZODone  100 mg Oral QHS   vitamin B-12  500 mcg Oral Daily   Continuous Infusions: PRN Meds:.acetaminophen **OR** acetaminophen, hydrALAZINE, ondansetron **OR** ondansetron (ZOFRAN) IV, polyethylene glycol Medications Prior to Admission:  Prior to Admission medications   Medication Sig Start Date End Date Taking? Authorizing Provider  atorvastatin (LIPITOR) 80 MG tablet Take 1 tablet by mouth once daily Patient taking differently: Take 80 mg by mouth every evening. 09/24/20  Yes Jearld Fenton, NP  buPROPion ER [facility] SR) 100 MG 12 hr tablet Take 1 tablet (100 mg total)  by mouth 2 (two) times daily. Patient taking differently: Take 100 mg by mouth in the morning and at bedtime. 04/30/21  Yes Baity, Coralie Keens, NP  CALCIUM PO Take 1 tablet by mouth daily.   Yes [provider]  Cholecalciferol (VITAMIN D-3) 25 MCG (1000 UT) CAPS Take 1,000 Units by mouth daily.   Yes [provider]  clopidogrel (PLAVIX) 75 MG tablet Take 1 tablet by mouth in the evening Patient taking differently: Take 75 mg by mouth every evening. 03/26/21  Yes Baity, Coralie Keens, NP  diltiazem (CARDIZEM CD) 180 MG 24 hr capsule Take 1 capsule by mouth once daily Patient taking differently: Take 180 mg by mouth daily. 10/11/20  Yes Jearld Fenton, NP  escitalopram (  LEXAPRO) 10 MG tablet Take 1 tablet (10 mg total) by mouth daily. 03/26/21  Yes Jearld Fenton, NP  ferrous sulfate 324 (65 Fe) MG TBEC Take 324 mg by mouth See admin instructions. Take 324 mg (65 Fe) by mouth in the morning with breakfast on Monday, Wednesday, and Friday only   Yes [provider]  gabapentin (NEURONTIN) 300 MG capsule Take 1 capsule by mouth twice daily Patient taking differently: Take 300 mg by mouth in the morning and at bedtime. 09/24/20  Yes Baity, Coralie Keens, NP  hydrochlorothiazide (MICROZIDE) 12.5 MG capsule Take 1 capsule by mouth once daily Patient taking differently: Take 12.5 mg by mouth in the morning. 09/24/20  Yes Baity, Coralie Keens, NP  Krill Oil 500 MG CAPS Take 500 mg by mouth in the morning and at bedtime.   Yes [provider]  lisinopril (ZESTRIL) 5 MG tablet Take 1 tablet (5 mg total) by mouth daily. 02/18/21  Yes Jearld Fenton, NP  loratadine (CLARITIN) 10 MG tablet Take 10 mg by mouth daily.   Yes [provider]  pantoprazole (PROTONIX) 40 MG tablet Take 1 tablet by mouth once daily Patient taking differently: Take 40 mg by mouth daily before breakfast. 05/31/21  Yes Baity, Coralie Keens, NP  polyethylene glycol (MIRALAX / GLYCOLAX) 17 g packet Take 17 g by mouth daily  as needed for mild constipation. Patient taking differently: Take 17 g by mouth daily as needed for mild constipation (MIX AS DIRECTED AND DRINK). 01/11/21  Yes Kathie Dike, MD  potassium chloride SA (KLOR-CON) 20 MEQ tablet TAKE 1  BY MOUTH ONCE DAILY Patient taking differently: Take 20 mEq by mouth in the morning. 03/18/21  Yes Jearld Fenton, NP  traZODone (DESYREL) 50 MG tablet Take 2 tablets (100 mg total) by mouth at bedtime. 04/30/21  Yes Baity, Coralie Keens, NP  vitamin B-12 (CYANOCOBALAMIN) 500 MCG tablet Take 500 mcg by mouth daily.   Yes [provider]   Allergies  Allergen Reactions   Codeine Nausea And Vomiting   Ether Nausea And Vomiting   Review of Systems  Neurological:  Positive for weakness.  Psychiatric/Behavioral:  Positive for sleep disturbance.    Physical Exam General: Alert, awake, in no acute distress.  Oriented only to person.  Seeing things that are not there (reports having bugs all throughout her room) HEENT: No bruits, no goiter, no JVD Heart: Regular rate and rhythm. No murmur appreciated. Lungs: Good air movement, clear Abdomen: Soft, nontender, nondistended, positive bowel sounds.   Ext: No significant edema Skin: Warm and dry Neuro: Grossly intact, nonfocal.   Vital Signs: BP (!) 163/70 (BP Location: Left Arm)   Pulse 76   Temp (!) 97.4 F (36.3 C) (Oral)   Resp 16   SpO2 96%  Pain Scale: PAINAD   Pain Score: 0-No pain   SpO2: SpO2: 96 % O2 Device:SpO2: 96 % O2 Flow Rate: .   IO: Intake/output summary:  Intake/Output Summary (Last 24 hours) at 06/09/2021 1126 Last data filed at 06/09/2021 1000 Gross per 24 hour  Intake 120 ml  Output 1350 ml  Net -1230 ml    LBM: Last BM Date: 06/06/21 Baseline Weight:   Most recent weight:       Palliative Assessment/Data:   Flowsheet Rows    Flowsheet Row Most Recent Value  Intake Tab   Referral Department Hospitalist  Unit at Time of Referral Med/Surg Unit  Palliative Care  Primary Diagnosis Sepsis/Infectious Disease  Date Notified 06/06/21  Palliative Care Type Return patient Palliative Care  Reason for referral Clarify Goals of Care  Date of Admission 06/04/21  Date first seen by Palliative Care 06/08/21  # of days Palliative referral response time 2 Day(s)  # of days IP prior to Palliative referral 2  Clinical Assessment   Palliative Performance Scale Score 60%  Psychosocial & Spiritual Assessment   Palliative Care Outcomes   Patient/Family meeting held? No       Time In: 1300 Time Out: 1345 Time Total: 45 minutes Greater than 50%  of this time was spent counseling and coordinating care related to the above assessment and plan.  Signed by: Micheline Rough, MD   Please contact Palliative Medicine Team phone at [phone] for questions and concerns.  For individual provider: See Shea Evans

## 2021-06-09 NOTE — Progress Notes (Signed)
Daily Progress Note   Patient Name: Carol Page       Date: 06/09/2021 DOB: 11-01-1935  Age: 85 y.o. MRN#: [id number] Attending Physician: Hosie Poisson, MD Primary Care Physician: Jearld Fenton, NP Admit Date: 06/04/2021  Reason for Consultation/Follow-up: Establishing goals of care  Subjective: I saw Ms. Carol Page today.  She remains confused.  I called and spoke with her daughter, Carol Page.  Carol Page and I discussed the continued decline her mother has been having in her nutrition, cognition, and functional status.  She tells me that her mother has been talking about dying and feels as though she is "giving up."  Carol Page and I discussed that the hospital can be useful as long as her mother is getting well enough from care she receives at the hospital to enjoy his time at home, but there is going to come a time in the future where she may be better served to plan on bringing care to her rather repeated trips to the hospital. We discussed hospice as a tool that may be beneficial in this goal when she reaches a point where we are trying to fix problems that are not fixable.  She is in agreement that a good plan would be to plan to transition to skilled facility for trial of rehab as they have previously been arranging. She has done well with rehabbing in the past. If she does well and continues to thrive, I encouraged they continue with this plan. If, however she is unable to regain function and she continues to decline, I recommended that she be followed by outpatient palliative care to help determine if she may be better served by focusing her care on staying at home with support of organization such as hospice.   Length of Stay: 4  Current Medications: Scheduled Meds:   atorvastatin  80 mg Oral  Daily   buPROPion ER  100 mg Oral BID   Chlorhexidine Gluconate Cloth  6 each Topical Daily   clopidogrel  75 mg Oral QPM   diltiazem  180 mg Oral Daily   enoxaparin (LOVENOX) injection  30 mg Subcutaneous QHS   escitalopram  10 mg Oral Daily   loratadine  10 mg Oral Daily   pantoprazole  40 mg Oral Daily   tamsulosin  0.4 mg Oral Daily   traZODone  100 mg Oral QHS   vitamin B-12  500 mcg Oral Daily    Continuous Infusions:   PRN Meds: acetaminophen **OR** acetaminophen, hydrALAZINE, ondansetron **OR** ondansetron (ZOFRAN) IV, polyethylene glycol  Physical Exam       General: Alert, awake, in no acute distress.  Oriented only to person.   HEENT: No bruits, no goiter, no JVD Heart: Regular rate and rhythm. No murmur appreciated. Lungs: Good air movement, clear Abdomen: Soft, nontender, nondistended, positive bowel sounds.   Ext: No significant edema Skin: Warm and dry Neuro: Grossly intact, nonfocal.    Vital Signs: BP (!) 179/62   Pulse 82   Temp 98.3 F (36.8 C) (Oral)   Resp 20   SpO2 94%  SpO2: SpO2: 94 % O2 Device: O2 Device: Room Air O2 Flow Rate:    Intake/output summary:  Intake/Output Summary (Last 24 hours) at 06/09/2021 2256 Last data filed at 06/09/2021 1000 Gross per 24 hour  Intake --  Output 1350 ml  Net -1350 ml   LBM: Last BM Date: 06/06/21 Baseline Weight:   Most recent weight:         Palliative Assessment/Data:    Flowsheet Rows    Flowsheet Row Most Recent Value  Intake Tab   Referral Department Hospitalist  Unit at Time of Referral Med/Surg Unit  Palliative Care Primary Diagnosis Sepsis/Infectious Disease  Date Notified 06/06/21  Palliative Care Type Return patient Palliative Care  Reason for referral Clarify Goals of Care  Date of Admission 06/04/21  Date first seen by Palliative Care 06/08/21  # of days Palliative referral response time 2 Day(s)  # of days IP prior to Palliative referral 2  Clinical Assessment   Palliative  Performance Scale Score 60%  Psychosocial & Spiritual Assessment   Palliative Care Outcomes   Patient/Family meeting held? No       Patient Active Problem List   Diagnosis Date Noted   AKI (acute kidney injury) ([place]) 06/04/2021   Hyperkalemia    CKD (chronic kidney disease) stage 4, GFR 15-29 ml/min ([place]) 03/26/2021   Iron deficiency anemia 02/18/2021   Morbid obesity ([place]) 01/17/2021   Aortic atherosclerosis ([place]) 01/17/2021   CKD (chronic kidney disease) stage 3, GFR 30-59 ml/min ([facility]) 01/08/2021   Obesity, Class III, BMI 40-49.9 (morbid obesity) ([place]) 01/08/2021   Anxiety and depression 10/06/2013   Urinary incontinence 10/06/2013   Chronic leg pain 10/06/2013   History of CVA (cerebrovascular accident) 10/06/2013   Essential hypertension 11/09/2012   HLD (hyperlipidemia) 11/09/2012   Pre-diabetes 11/09/2012   Seizure disorder ([place]) 11/09/2012    Palliative Care Assessment & Plan    Recommendations/Plan: DNR/DNI Plan for trial of rehab Recommend palliative care to follow at skilled facility  Goals of Care and Additional Recommendations: Limitations on Scope of Treatment: Avoid Hospitalization  Code Status:    Code Status Orders  (From admission, onward)           Start     Ordered   06/04/21 1513  Do not attempt resuscitation (DNR)  Continuous       Question Answer Comment  In the event of cardiac or respiratory ARREST Do not call a "code blue"   In the event of cardiac or respiratory ARREST Do not perform Intubation, CPR, defibrillation or ACLS   In the event of cardiac or respiratory ARREST Use medication by any route, position, wound care, and other measures to relive pain and suffering. May use oxygen, suction and manual treatment of airway obstruction  as needed for comfort.      06/04/21 1512           Code Status History     Date Active Date Inactive Code Status Order ID Comments User Context   01/09/2021 1358 01/11/2021 1851 DNR [id number]   Kathie Dike, MD Inpatient   01/07/2021 1557 01/09/2021 1358 Full Code [id number]  Lequita Halt, MD ED   04/20/2018 0009 04/26/2018 2038 DNR [id number]  Shelbie Proctor, MD Inpatient   08/13/2015 1614 08/23/2015 1855 DNR [id number]  Samella Parr, NP Inpatient   04/16/2014 1547 04/17/2014 1720 Full Code [id number]  Debbe Odea, MD ED   05/22/2013 2011 05/24/2013 1904 Full Code [id number]  Kinnie Feil, MD Inpatient       Prognosis:  Unable to determine  Discharge Planning: [facility] [place] for rehab with Palliative care service follow-up  Care plan was discussed with daughter  Thank you for allowing the Palliative Medicine Team to assist in the care of this patient.       Total Time 40 Prolonged Time Billed  no       Greater than 50%  of this time was spent counseling and coordinating care related to the above assessment and plan.  Micheline Rough, MD  Please contact Palliative Medicine Team phone at [phone] for questions and concerns.

## 2021-06-10 NOTE — Progress Notes (Signed)
PROGRESS NOTE    Carol Page  ZYS:[id number] DOB: February 20, 1936 DOA: 06/04/2021 PCP: Jearld Fenton, NP    Brief Narrative:Carol Page is a 85 y.o. female with medical history significant of seasonal allergies, osteoarthritis, cellulitis of the left leg, depression, hyperlipidemia, hypertension, prediabetes, proctitis, nasal skin cancer, history of other non-hemorrhagic stroke, stage 3b CKD, urinary incontinence who is coming to the emergency department via EMS from home due to confusion and hallucinations for the past 2 to 3 days. CT head without contrast did not show any acute abnormality.  There was chronic small vessel disease. MRI of the brain without contrast did not show any acute stroke.  Assessment & Plan:   Principal Problem:   AKI (acute kidney injury) ([place]) Active Problems:   Essential hypertension   HLD (hyperlipidemia)   Pre-diabetes   Anxiety and depression   CKD (chronic kidney disease) stage 3, GFR 30-59 ml/min ([facility])   Iron deficiency anemia   Hyperkalemia  AKI on CKD stage III a Secondary to poor oral intake/ dehydration.  Baseline creatinine between 1.3 to 1.5  Admitted with a creatinine of 2.38, started on IV fluids and creatinine has improved to 2.19,  to 1.9 to 1.4   US renal does not show any hydronephrosis.      Mild acute metabolic encephalopathy in the setting of underlying dementia.  - probably from poor oral intake and dehydration and AKI.  - she is alert and answering simple questions but very confused.  - MRI brain without contrast ordered , negative for acute stroke.  - VITAMIN B 12 elevated at 2989. TSH wnl. Folate wnl.    Acute Urinary retention:  Foley catheter placed after 3 in and out cath insertion.  She was started on flomax    Hyperkalemia: Potassium 4.0. Probably from dehydration:  Resolved with IV fluids and lokelma.    Mild rhabdomyolysis: Resolved with hydration.   Hypertension: Blood pressure 168/59.  Cardizem and lisinopril  restarted.  Follow-up closely.   Anemia of chronic disease:  Hemoglobin stable around 10.  Continue to monitor.     Anxiety and Depression: On Lexapro and Wellbutrin.  It looks like she was also started on trazodone 100 mg nightly at bedtime on 06/04/2021    Hyperlipidemia: Continue Lipitor   Prior history of Stroke: Continue Plavix and statin  COVID 19 infection  last week:  Pt denies any chest pain or sob.  No hypoxia.CXR shows pneumonitis. Pt received 5 days of decadron.  CRP wnl.   Estimated body mass index is 42.54 kg/m as calculated from the following:   Height as of 04/30/21: 5\' 2"  (1.575 m).   Weight as of 04/30/21: 105.5 kg.  Antimicrobials:  Anti-infectives (From admission, onward)    None        Subjective: Patient appears confused when I walked into the room and asked her how she is doing she said she wants to dance  Seen by palliative care recommend being DNR/DNI and plan for trial of rehabilitation.  Objective: Vitals:   06/10/21 0348 06/10/21 0633 06/10/21 1036 06/10/21 1300  BP: (!) 183/99 (!) 174/58 (!) 175/71 (!) 168/59  Pulse: 83 83  83  Resp: 20   16  Temp: 97.8 F (36.6 C)   97.8 F (36.6 C)  TempSrc: Axillary   Oral  SpO2: 93% 97%  96%    Intake/Output Summary (Last 24 hours) at 06/10/2021 1604 Last data filed at 06/10/2021 0349 Gross per 24 hour  Intake --  Output  650 ml  Net -650 ml   There were no vitals filed for this visit.  Examination: Foley in place  General exam: Appears calm and comfortable  Respiratory system: Clear to auscultation. Respiratory effort normal. Cardiovascular system: S1 & S2 heard, RRR. No JVD, murmurs, rubs, gallops or clicks.  Trace pedal edema. Gastrointestinal system: Abdomen is nondistended, soft and nontender. No organomegaly or masses felt. Normal bowel sounds heard. Central nervous system: Alert and oriented. No focal neurological deficits. Extremities: Trace pedal edema. Skin: No rashes, lesions  or ulcers Psychiatry: Unable to assess    Data Reviewed: I have personally reviewed following labs and imaging studies  CBC: Recent Labs  Lab 06/04/21 1325 06/05/21 0455 06/06/21 0448  WBC 4.7 5.3 8.8  NEUTROABS 3.2  --  6.5  HGB 11.0* 9.7* 10.8*  HCT 34.1* 29.6* 32.7*  MCV 98.3 97.4 97.0  PLT 209 201 270   Basic Metabolic Panel: Recent Labs  Lab 06/05/21 0455 06/06/21 0448 06/07/21 0518 06/08/21 0909 06/09/21 0908  NA 135 139 137 137 139  K 4.8 5.2* 4.8 3.9 4.0  CL 107 108 110 110 112*  CO2 23 21* 20* 22 21*  GLUCOSE 100* 97 125* 119* 95  BUN 59* 58* 52* 46* 45*  CREATININE 2.19* 2.37* 1.93* 1.58* 1.41*  CALCIUM 8.6* 9.2 8.5* 8.4* 8.4*   GFR: CrCl cannot be calculated (Unknown ideal weight.). Liver Function Tests: Recent Labs  Lab 06/04/21 1325  AST 17  ALT 14  ALKPHOS 78  BILITOT 0.5  PROT 7.4  ALBUMIN 3.8   No results for input(s): LIPASE, AMYLASE in the last 168 hours. No results for input(s): AMMONIA in the last 168 hours. Coagulation Profile: No results for input(s): INR, PROTIME in the last 168 hours. Cardiac Enzymes: Recent Labs  Lab 06/04/21 1325  CKTOTAL 319*   BNP (last 3 results) No results for input(s): PROBNP in the last 8760 hours. HbA1C: No results for input(s): HGBA1C in the last 72 hours. CBG: No results for input(s): GLUCAP in the last 168 hours. Lipid Profile: No results for input(s): CHOL, HDL, LDLCALC, TRIG, CHOLHDL, LDLDIRECT in the last 72 hours. Thyroid Function Tests: No results for input(s): TSH, T4TOTAL, FREET4, T3FREE, THYROIDAB in the last 72 hours. Anemia Panel: No results for input(s): VITAMINB12, FOLATE, FERRITIN, TIBC, IRON, RETICCTPCT in the last 72 hours. Sepsis Labs: No results for input(s): PROCALCITON, LATICACIDVEN in the last 168 hours.  Recent Results (from the past 240 hour(s))  Resp Panel by RT-PCR (Flu A&B, Covid) Nasopharyngeal Swab     Status: Abnormal   Collection Time: 06/04/21  1:26 PM    Specimen: Nasopharyngeal Swab; Nasopharyngeal(NP) swabs in vial transport medium  Result Value Ref Range Status   SARS Coronavirus 2 by RT PCR POSITIVE (A) NEGATIVE Final    Comment: RESULT CALLED TO, READ BACK BY AND VERIFIED WITH: DANIEL,C. RN AT 1726 06/04/21 MULLINS,T (NOTE) SARS-CoV-2 target nucleic acids are DETECTED.  The SARS-CoV-2 RNA is generally detectable in upper respiratory specimens during the acute phase of infection. Positive results are indicative of the presence of the identified virus, but do not rule out bacterial infection or co-infection with other pathogens not detected by the test. Clinical correlation with patient history and other diagnostic information is necessary to determine patient infection status. The expected result is Negative.  Fact Sheet for Patients: EntrepreneurPulse.com.au  Fact Sheet for Healthcare Providers: IncredibleEmployment.be  This test is not yet approved or cleared by the [place] FDA and  has  been authorized for detection and/or diagnosis of SARS-CoV-2 by FDA under an Emergency Use Authorization (EUA).  This EUA will remain in effect (meaning this test  can be used) for the duration of  the COVID-19 declaration under Section 564(b)(1) of the Act, 21 U.S.C. section 360bbb-3(b)(1), unless the authorization is terminated or revoked sooner.     Influenza A by PCR NEGATIVE NEGATIVE Final   Influenza B by PCR NEGATIVE NEGATIVE Final    Comment: (NOTE) The Xpert Xpress SARS-CoV-2/FLU/RSV plus assay is intended as an aid in the diagnosis of influenza from Nasopharyngeal swab specimens and should not be used as a sole basis for treatment. Nasal washings and aspirates are unacceptable for Xpert Xpress SARS-CoV-2/FLU/RSV testing.  Fact Sheet for Patients: EntrepreneurPulse.com.au  Fact Sheet for Healthcare Providers: IncredibleEmployment.be  This test is  not yet approved or cleared by the [place] FDA and has been authorized for detection and/or diagnosis of SARS-CoV-2 by FDA under an Emergency Use Authorization (EUA). This EUA will remain in effect (meaning this test can be used) for the duration of the COVID-19 declaration under Section 564(b)(1) of the Act, 21 U.S.C. section 360bbb-3(b)(1), unless the authorization is terminated or revoked.  Performed at [facility], [place] [address] [place],  [address]          Radiology Studies: No results found.      Scheduled Meds:  atorvastatin  80 mg Oral Daily   buPROPion ER  100 mg Oral BID   Chlorhexidine Gluconate Cloth  6 each Topical Daily   clopidogrel  75 mg Oral QPM   diltiazem  180 mg Oral Daily   enoxaparin (LOVENOX) injection  30 mg Subcutaneous QHS   escitalopram  10 mg Oral Daily   loratadine  10 mg Oral Daily   pantoprazole  40 mg Oral Daily   tamsulosin  0.4 mg Oral Daily   traZODone  100 mg Oral QHS   vitamin B-12  500 mcg Oral Daily   Continuous Infusions:   LOS: 5 days   Georgette Shell, MD 06/10/2021, 4:04 PM

## 2021-06-10 NOTE — Progress Notes (Signed)
Physical Therapy Treatment Patient Details Name: Carol Page MRN: [id number] DOB: Jun 19, 1936 Today's Date: 06/10/2021   History of Present Illness 85yo female who presented on 06/04/21 with increased confusion, hallucinations, and weakness over the past 2-3 days. Found to be Covid + and with AKI on CKD. PMH OA, cellulitis, HLD, HTN, pre-DM, CVA, CKD, urinary incontinence, skin CA    PT Comments    Max assist for bed mobility and for sit to stand. Attempted sit to stand with RW and with Stedy, however pt was unable to come to full upright standing position. Performed BUE/LE strengthening exercises in sitting at EOB. Pt pleasant and puts forth good effort.    Recommendations for follow up therapy are one component of a multi-disciplinary discharge planning process, led by the attending physician.  Recommendations may be updated based on patient status, additional functional criteria and insurance authorization.  Follow Up Recommendations  Skilled nursing-short term rehab (<3 hours/day)     Assistance Recommended at Discharge Frequent or constant Supervision/Assistance  Equipment Recommendations  BSC/3in1;Wheelchair (measurements PT);Wheelchair cushion (measurements PT);Hospital bed;Other (comment)    Recommendations for Other Services       Precautions / Restrictions Precautions Precautions: Fall;Other (comment) Precaution Comments: incontinence, hx CVA, confused Restrictions Weight Bearing Restrictions: No     Mobility  Bed Mobility Overal bed mobility: Needs Assistance Bed Mobility: Supine to Sit;Sit to Supine     Supine to sit: HOB elevated;+2 for safety/equipment;Max assist Sit to supine: Max assist;+2 for safety/equipment   General bed mobility comments: pt able to assist with advancing LLE and pulling up with LUE, max A to pivot hips and fully raise trunk    Transfers Overall transfer level: Needs assistance Equipment used: Rolling walker (2 wheels) Transfers:  Sit to/from Stand Sit to Stand: Max assist;From elevated surface           General transfer comment: attempted sit to stand with max A with RW, pt unable to come to full upright position (no +2 assist available), then attempted sit to stand with bariatric Stedy, pt not able to come to full upright position in order to lower flaps behind her. Assisted pt back to sitting on EOB then to supine. Transfer via Lift Equipment: Stedy  Ambulation/Gait                   Stairs             Wheelchair Mobility    Modified Rankin (Stroke Patients Only)       Balance Overall balance assessment: Needs assistance Sitting-balance support: No upper extremity supported;Feet supported Sitting balance-Leahy Scale: Fair     Standing balance support: During functional activity;Bilateral upper extremity supported Standing balance-Leahy Scale: Zero Standing balance comment: unable to come to full stand with max A and BUE support on RW and then on Stedy                            Cognition Arousal/Alertness: Awake/alert Behavior During Therapy: [facility] for tasks assessed/performed Overall Cognitive Status: History of cognitive impairments - at baseline                                 General Comments: A&O to self and to location, very pleasantly confused but unable to provide hx/PLOF;        Exercises General Exercises - Lower Extremity Long Arc Quad: AROM;Both;10 reps;Seated Shoulder  Exercises Shoulder Flexion: AAROM;10 reps;Both;Seated    General Comments        Pertinent Vitals/Pain Faces Pain Scale: No hurt    Home Living                          Prior Function            PT Goals (current goals can now be found in the care plan section) Acute Rehab PT Goals PT Goal Formulation: Patient unable to participate in goal setting Time For Goal Achievement: 06/20/21 Progress towards PT goals: Not progressing toward goals - comment  (weakness limiting progress)    Frequency    Min 2X/week      PT Plan Current plan remains appropriate    Co-evaluation              AM-PAC PT "6 Clicks" Mobility   Outcome Measure  Help needed turning from your back to your side while in a flat bed without using bedrails?: A Lot Help needed moving from lying on your back to sitting on the side of a flat bed without using bedrails?: Total Help needed moving to and from a bed to a chair (including a wheelchair)?: Total Help needed standing up from a chair using your arms (e.g., wheelchair or bedside chair)?: Total Help needed to walk in hospital room?: Total Help needed climbing 3-5 steps with a railing? : Total 6 Click Score: 7    End of Session Equipment Utilized During Treatment: Gait belt Activity Tolerance: Patient limited by fatigue Patient left: in bed;with call bell/phone within reach;with bed alarm set Nurse Communication: Mobility status;Need for lift equipment PT Visit Diagnosis: Muscle weakness (generalized) (M62.81);Other abnormalities of gait and mobility (R26.89)     Time: 9485-4627 PT Time Calculation (min) (ACUTE ONLY): 24 min  Charges:  $Therapeutic Exercise: 8-22 mins $Therapeutic Activity: 8-22 mins                     Blondell Reveal Kistler PT 06/10/2021  Acute Rehabilitation Services Pager [phone] Office [phone]

## 2021-06-11 LAB — COMPREHENSIVE METABOLIC PANEL
ALT: 20 U/L (ref 0–44)
AST: 17 U/L (ref 15–41)
Albumin: 2.9 g/dL — ABNORMAL LOW (ref 3.5–5.0)
Alkaline Phosphatase: 47 U/L (ref 38–126)
Anion gap: 6 (ref 5–15)
BUN: 37 mg/dL — ABNORMAL HIGH (ref 8–23)
CO2: 22 mmol/L (ref 22–32)
Calcium: 8.4 mg/dL — ABNORMAL LOW (ref 8.9–10.3)
Chloride: 113 mmol/L — ABNORMAL HIGH (ref 98–111)
Creatinine, Ser: 1.41 mg/dL — ABNORMAL HIGH (ref 0.44–1.00)
GFR, Estimated: 37 mL/min — ABNORMAL LOW (ref >60)
Glucose, Bld: 86 mg/dL (ref 70–99)
Potassium: 3.8 mmol/L (ref 3.5–5.1)
Sodium: 141 mmol/L (ref 135–145)
Total Bilirubin: 0.6 mg/dL (ref 0.3–1.2)
Total Protein: 5.4 g/dL — ABNORMAL LOW (ref 6.5–8.1)

## 2021-06-11 LAB — CBC
HCT: 28.3 % — ABNORMAL LOW (ref 36.0–46.0)
Hemoglobin: 9.4 g/dL — ABNORMAL LOW (ref 12.0–15.0)
MCH: 32.4 pg (ref 26.0–34.0)
MCHC: 33.2 g/dL (ref 30.0–36.0)
MCV: 97.6 fL (ref 80.0–100.0)
Platelets: 217 10*3/uL (ref 150–400)
RBC: 2.9 MIL/uL — ABNORMAL LOW (ref 3.87–5.11)
RDW: 15.2 % (ref 11.5–15.5)
WBC: 8.5 10*3/uL (ref 4.0–10.5)
nRBC: 0 % (ref 0.0–0.2)

## 2021-06-11 LAB — VITAMIN B1: Vitamin B1 (Thiamine): 132.2 nmol/L (ref 66.5–200.0)

## 2021-06-11 MED ORDER — LISINOPRIL 5 MG PO TABS
5.0000 mg | ORAL_TABLET | Freq: Every day | ORAL | Status: DC
  Administered 2021-06-11 – 2021-06-12 (×2): 5 mg via ORAL
  Filled 2021-06-11 (×2): qty 1

## 2021-06-11 NOTE — Progress Notes (Addendum)
PROGRESS NOTE    Carol Page  GDJ:[id number] DOB: 12-16-1935 DOA: 06/04/2021 PCP: Jearld Fenton, NP    Brief Narrative:Carol Page is a 85 y.o. female with medical history significant of seasonal allergies, osteoarthritis, cellulitis of the left leg, depression, hyperlipidemia, hypertension, prediabetes, proctitis, nasal skin cancer, history of other non-hemorrhagic stroke, stage 3b CKD, urinary incontinence who is coming to the emergency department via EMS from home due to confusion and hallucinations for the past 2 to 3 days. CT head without contrast did not show any acute abnormality.  There was chronic small vessel disease. MRI of the brain without contrast did not show any acute stroke.  Assessment & Plan:   Principal Problem:   AKI (acute kidney injury) ([place]) Active Problems:   Essential hypertension   HLD (hyperlipidemia)   Pre-diabetes   Anxiety and depression   CKD (chronic kidney disease) stage 3, GFR 30-59 ml/min (HCC)   Iron deficiency anemia   Hyperkalemia  AKI on CKD stage III a Secondary to poor oral intake/ dehydration.  Resolved with fluids, now off fluids US renal does not show any hydronephrosis.      Mild acute metabolic encephalopathy in the setting of underlying dementia.  - probably from poor oral intake and dehydration and AKI.  - she is alert and answering simple questions but very confused.  - MRI brain without contrast ordered , negative for acute stroke.  - VITAMIN B 12 elevated at 2989. TSH wnl. Folate wnl.  - for SNF, pending   Acute Urinary retention:  Foley catheter placed after 3 in and out cath insertion.  She was started on flomax. Will need outpatient urology f/u   Hyperkalemia: Potassium 4.0. Probably from dehydration:  Resolved with IV fluids and lokelma.    Mild rhabdomyolysis: Resolved with hydration.   Hypertension: Blood pressure 168/59.  Cardizem and lisinopril restarted.  Now wnl   Anemia of chronic disease:   Hemoglobin stable around 10.  Continue to monitor.     Anxiety and Depression: On Lexapro and Wellbutrin.  It looks like she was also started on trazodone 100 mg nightly at bedtime on 06/04/2021    Hyperlipidemia: Continue Lipitor   Prior history of Stroke: Continue Plavix and statin  COVID 19 infection  last week:  Pt denies any chest pain or sob.  No hypoxia.CXR shows pneumonitis. Pt received 5 days of decadron.  CRP wnl.   Family communication: daughter updated telephonically 11/23  Estimated body mass index is 42.54 kg/m as calculated from the following:   Height as of 04/30/21: 5\' 2"  (1.575 m).   Weight as of 04/30/21: 105.5 kg.  Antimicrobials:  Anti-infectives (From admission, onward)    None        Subjective: No complaints. Tolerating some diet.  Objective: Vitals:   06/10/21 1300 06/10/21 2143 06/11/21 0548 06/11/21 1127  BP: (!) 168/59 (!) 146/77 (!) 172/61 (!) 130/57  Pulse: 83 78 71 75  Resp: 16 16 16    Temp: 97.8 F (36.6 C) 98.1 F (36.7 C) 98.9 F (37.2 C)   TempSrc: Oral Axillary Oral   SpO2: 96% 95% 96%     Intake/Output Summary (Last 24 hours) at 06/11/2021 1229 Last data filed at 06/11/2021 0557 Gross per 24 hour  Intake 356 ml  Output 900 ml  Net -544 ml   There were no vitals filed for this visit.  Examination: Foley in place  General exam: Appears calm and comfortable  Respiratory system: Clear to auscultation. Respiratory effort  normal. Cardiovascular system: S1 & S2 heard, RRR. No JVD, murmurs, rubs, gallops or clicks.  Trace pedal edema. Gastrointestinal system: Abdomen is nondistended, soft and nontender. No organomegaly or masses felt. Normal bowel sounds heard. Central nervous system: Alert and oriented. No focal neurological deficits. Extremities: Trace pedal edema. Skin: No rashes, lesions or ulcers Psychiatry: Unable to assess. calm    Data Reviewed: I have personally reviewed following labs and imaging  studies  CBC: Recent Labs  Lab 06/04/21 1325 06/05/21 0455 06/06/21 0448 06/11/21 0503  WBC 4.7 5.3 8.8 8.5  NEUTROABS 3.2  --  6.5  --   HGB 11.0* 9.7* 10.8* 9.4*  HCT 34.1* 29.6* 32.7* 28.3*  MCV 98.3 97.4 97.0 97.6  PLT 209 201 210 161   Basic Metabolic Panel: Recent Labs  Lab 06/06/21 0448 06/07/21 0518 06/08/21 0909 06/09/21 0908 06/11/21 0503  NA 139 137 137 139 141  K 5.2* 4.8 3.9 4.0 3.8  CL 108 110 110 112* 113*  CO2 21* 20* 22 21* 22  GLUCOSE 97 125* 119* 95 86  BUN 58* 52* 46* 45* 37*  CREATININE 2.37* 1.93* 1.58* 1.41* 1.41*  CALCIUM 9.2 8.5* 8.4* 8.4* 8.4*   GFR: CrCl cannot be calculated (Unknown ideal weight.). Liver Function Tests: Recent Labs  Lab 06/04/21 1325 06/11/21 0503  AST 17 17  ALT 14 20  ALKPHOS 78 47  BILITOT 0.5 0.6  PROT 7.4 5.4*  ALBUMIN 3.8 2.9*   No results for input(s): LIPASE, AMYLASE in the last 168 hours. No results for input(s): AMMONIA in the last 168 hours. Coagulation Profile: No results for input(s): INR, PROTIME in the last 168 hours. Cardiac Enzymes: Recent Labs  Lab 06/04/21 1325  CKTOTAL 319*   BNP (last 3 results) No results for input(s): PROBNP in the last 8760 hours. HbA1C: No results for input(s): HGBA1C in the last 72 hours. CBG: No results for input(s): GLUCAP in the last 168 hours. Lipid Profile: No results for input(s): CHOL, HDL, LDLCALC, TRIG, CHOLHDL, LDLDIRECT in the last 72 hours. Thyroid Function Tests: No results for input(s): TSH, T4TOTAL, FREET4, T3FREE, THYROIDAB in the last 72 hours. Anemia Panel: No results for input(s): VITAMINB12, FOLATE, FERRITIN, TIBC, IRON, RETICCTPCT in the last 72 hours. Sepsis Labs: No results for input(s): PROCALCITON, LATICACIDVEN in the last 168 hours.  Recent Results (from the past 240 hour(s))  Resp Panel by RT-PCR (Flu A&B, Covid) Nasopharyngeal Swab     Status: Abnormal   Collection Time: 06/04/21  1:26 PM   Specimen: Nasopharyngeal Swab;  Nasopharyngeal(NP) swabs in vial transport medium  Result Value Ref Range Status   SARS Coronavirus 2 by RT PCR POSITIVE (A) NEGATIVE Final    Comment: RESULT CALLED TO, READ BACK BY AND VERIFIED WITH: DANIEL,C. RN AT 1726 06/04/21 MULLINS,T (NOTE) SARS-CoV-2 target nucleic acids are DETECTED.  The SARS-CoV-2 RNA is generally detectable in upper respiratory specimens during the acute phase of infection. Positive results are indicative of the presence of the identified virus, but do not rule out bacterial infection or co-infection with other pathogens not detected by the test. Clinical correlation with patient history and other diagnostic information is necessary to determine patient infection status. The expected result is Negative.  Fact Sheet for Patients: EntrepreneurPulse.com.au  Fact Sheet for Healthcare Providers: IncredibleEmployment.be  This test is not yet approved or cleared by the [place] FDA and  has been authorized for detection and/or diagnosis of SARS-CoV-2 by FDA under an Emergency Use Authorization (EUA).  This EUA will remain in effect (meaning this test  can be used) for the duration of  the COVID-19 declaration under Section 564(b)(1) of the Act, 21 U.S.C. section 360bbb-3(b)(1), unless the authorization is terminated or revoked sooner.     Influenza A by PCR NEGATIVE NEGATIVE Final   Influenza B by PCR NEGATIVE NEGATIVE Final    Comment: (NOTE) The Xpert Xpress SARS-CoV-2/FLU/RSV plus assay is intended as an aid in the diagnosis of influenza from Nasopharyngeal swab specimens and should not be used as a sole basis for treatment. Nasal washings and aspirates are unacceptable for Xpert Xpress SARS-CoV-2/FLU/RSV testing.  Fact Sheet for Patients: EntrepreneurPulse.com.au  Fact Sheet for Healthcare Providers: IncredibleEmployment.be  This test is not yet approved or cleared by  the [place] FDA and has been authorized for detection and/or diagnosis of SARS-CoV-2 by FDA under an Emergency Use Authorization (EUA). This EUA will remain in effect (meaning this test can be used) for the duration of the COVID-19 declaration under Section 564(b)(1) of the Act, 21 U.S.C. section 360bbb-3(b)(1), unless the authorization is terminated or revoked.  Performed at [facility], [place] [address], [place], [place] [address]          Radiology Studies: No results found.      Scheduled Meds:  atorvastatin  80 mg Oral Daily   buPROPion ER  100 mg Oral BID   Chlorhexidine Gluconate Cloth  6 each Topical Daily   clopidogrel  75 mg Oral QPM   diltiazem  180 mg Oral Daily   escitalopram  10 mg Oral Daily   lisinopril  5 mg Oral Daily   loratadine  10 mg Oral Daily   pantoprazole  40 mg Oral Daily   tamsulosin  0.4 mg Oral Daily   traZODone  100 mg Oral QHS   vitamin B-12  500 mcg Oral Daily   Continuous Infusions:   LOS: 6 days   Desma Maxim, MD 06/11/2021, 12:29 PM

## 2021-06-11 NOTE — Plan of Care (Signed)
  Problem: Activity: Goal: Risk for activity intolerance will decrease Outcome: Not Progressing   Problem: Nutrition: Goal: Adequate nutrition will be maintained Outcome: Not Progressing   Problem: Elimination: Goal: Will not experience complications related to urinary retention Outcome: Progressing

## 2021-06-11 NOTE — TOC Progression Note (Signed)
Transition of Care [facility]) - Progression Note    Patient Details  Name: Rashea Hoskie MRN: [id number] Date of Birth: [birth year]  [facility] CM/SW [place], [place] Phone Number: 06/11/2021, 10:17 AM  Clinical Narrative:   Family chooses [facility].  Patient can come off of precautions on Friday.  Nikki at [facility] is unsure if they will have a bed that day, but has her penciled in. TOC will continue to follow during the course of hospitalization.     Expected Discharge Plan: Skilled Nursing Facility Barriers to Discharge: Other (must enter comment) (has bed offer, possible bed availability Friday)  Expected Discharge Plan and Services Expected Discharge Plan: [place]   Discharge Planning Services: CM Consult Post Acute Care Choice: [place] Living arrangements for the past 2 months: Single Family Home                                       Social Determinants of Health (SDOH) Interventions    Readmission Risk Interventions No flowsheet data found.

## 2021-06-11 NOTE — Plan of Care (Signed)
  Problem: Education: Goal: Knowledge of General Education information will improve Description: Including pain rating scale, medication(s)/side effects and non-pharmacologic comfort measures Outcome: Progressing   Problem: Health Behavior/Discharge Planning: Goal: Ability to manage health-related needs will improve Outcome: Progressing   Problem: Clinical Measurements: Goal: Ability to maintain clinical measurements within normal limits will improve Outcome: Progressing Goal: Will remain free from infection Outcome: Progressing Goal: Diagnostic test results will improve Outcome: Progressing Goal: Cardiovascular complication will be avoided Outcome: Progressing   Problem: Activity: Goal: Risk for activity intolerance will decrease Outcome: Progressing   Problem: Nutrition: Goal: Adequate nutrition will be maintained Outcome: Progressing   Problem: Pain Managment: Goal: General experience of comfort will improve Outcome: Progressing   Problem: Safety: Goal: Ability to remain free from injury will improve Outcome: Progressing   Problem: Skin Integrity: Goal: Risk for impaired skin integrity will decrease Outcome: Progressing   Problem: Education: Goal: Knowledge of risk factors and measures for prevention of condition will improve Outcome: Progressing   Problem: Coping: Goal: Psychosocial and spiritual needs will be supported Outcome: Progressing   Problem: Respiratory: Goal: Will maintain a patent airway Outcome: Progressing Goal: Complications related to the disease process, condition or treatment will be avoided or minimized Outcome: Progressing

## 2021-06-12 MED ORDER — LIP MEDEX EX OINT
TOPICAL_OINTMENT | CUTANEOUS | Status: AC
  Administered 2021-06-12: 75
  Filled 2021-06-12: qty 7

## 2021-06-12 MED ORDER — LISINOPRIL 10 MG PO TABS
10.0000 mg | ORAL_TABLET | Freq: Every day | ORAL | Status: DC
  Administered 2021-06-13: 10 mg via ORAL
  Filled 2021-06-12: qty 1

## 2021-06-12 MED ORDER — SIMETHICONE 80 MG PO CHEW
80.0000 mg | CHEWABLE_TABLET | Freq: Once | ORAL | Status: AC
  Administered 2021-06-12: 80 mg via ORAL
  Filled 2021-06-12: qty 1

## 2021-06-12 NOTE — Progress Notes (Addendum)
PROGRESS NOTE    Carol Page  ZOX:[id number] DOB: 1936/03/06 DOA: 06/04/2021 PCP: Jearld Fenton, NP    Brief Narrative:Carol Page is a 85 y.o. female with medical history significant of seasonal allergies, osteoarthritis, cellulitis of the left leg, depression, hyperlipidemia, hypertension, prediabetes, proctitis, nasal skin cancer, history of other non-hemorrhagic stroke, stage 3b CKD, urinary incontinence who is coming to the emergency department via EMS from home due to confusion and hallucinations for the past 2 to 3 days. CT head without contrast did not show any acute abnormality.  There was chronic small vessel disease. MRI of the brain without contrast did not show any acute stroke.  Assessment & Plan:   Principal Problem:   AKI (acute kidney injury) ([place]) Active Problems:   Essential hypertension   HLD (hyperlipidemia)   Pre-diabetes   Anxiety and depression   CKD (chronic kidney disease) stage 3, GFR 30-59 ml/min (HCC)   Iron deficiency anemia   Hyperkalemia  AKI on CKD stage III a Secondary to poor oral intake/ dehydration.  Resolved with fluids, now off fluids US renal does not show any hydronephrosis.      Mild acute metabolic encephalopathy in the setting of underlying dementia.  - probably from poor oral intake and dehydration and AKI.  - she is alert and answering simple questions but very confused.  - MRI brain without contrast ordered , negative for acute stroke.  - VITAMIN B 12 elevated at 2989. TSH wnl. Folate wnl.  - for SNF, pending, possible d/c tomorrow   Acute Urinary retention:  Foley catheter placed after 3 in and out cath insertion.  She was started on flomax. Will need outpatient urology f/u   Hyperkalemia: Probably from dehydration:  Resolved with IV fluids and lokelma.    Mild rhabdomyolysis: Resolved with hydration.   Hypertension:   Cardizem and lisinopril restarted.  Remains elevated, will increase lisinopril to 10   Anemia of  chronic disease:  Hemoglobin stable around 10.  Continue to monitor.     Anxiety and Depression: On Lexapro and Wellbutrin.  It looks like she was also started on trazodone 100 mg nightly at bedtime on 06/04/2021    Hyperlipidemia: Continue Lipitor   Prior history of Stroke: Continue Plavix and statin  Hx CVA - cont plavix  COVID 19 infection  last week:  Pt denies any chest pain or sob.  No hypoxia.CXR shows pneumonitis. Pt received 5 days of decadron.  CRP wnl.   Family communication: daughter updated telephonically 11/24  Estimated body mass index is 42.54 kg/m as calculated from the following:   Height as of 04/30/21: 5\' 2"  (1.575 m).   Weight as of 04/30/21: 105.5 kg.  Antimicrobials:  Anti-infectives (From admission, onward)    None        Subjective: No complaints. Tolerating some diet. No cough  Objective: Vitals:   06/11/21 1316 06/11/21 2039 06/12/21 0417 06/12/21 0613  BP: (!) 147/60 (!) 157/48 (!) 153/53 (!) 161/78  Pulse: 75 73 75 87  Resp: 17 20 20    Temp: 97.8 F (36.6 C) 98.1 F (36.7 C) 97.6 F (36.4 C)   TempSrc: Oral Oral Oral   SpO2: 95% 93% 96%     Intake/Output Summary (Last 24 hours) at 06/12/2021 1402 Last data filed at 06/12/2021 0400 Gross per 24 hour  Intake 195 ml  Output 850 ml  Net -655 ml   There were no vitals filed for this visit.  Examination: Foley in place  General exam:  Appears calm and comfortable  Respiratory system: Clear to auscultation. Respiratory effort normal. Cardiovascular system: S1 & S2 heard, RRR. No JVD, murmurs, rubs, gallops or clicks.  No pedal edema. Gastrointestinal system: Abdomen is nondistended, soft and nontender. No organomegaly or masses felt. Normal bowel sounds heard. Central nervous system: Alert and oriented to person and place. No focal neurological deficits. Skin: No rashes, lesions or ulcers Psychiatry: Unable to assess. calm    Data Reviewed: I have personally reviewed  following labs and imaging studies  CBC: Recent Labs  Lab 06/06/21 0448 06/11/21 0503  WBC 8.8 8.5  NEUTROABS 6.5  --   HGB 10.8* 9.4*  HCT 32.7* 28.3*  MCV 97.0 97.6  PLT 210 269   Basic Metabolic Panel: Recent Labs  Lab 06/06/21 0448 06/07/21 0518 06/08/21 0909 06/09/21 0908 06/11/21 0503  NA 139 137 137 139 141  K 5.2* 4.8 3.9 4.0 3.8  CL 108 110 110 112* 113*  CO2 21* 20* 22 21* 22  GLUCOSE 97 125* 119* 95 86  BUN 58* 52* 46* 45* 37*  CREATININE 2.37* 1.93* 1.58* 1.41* 1.41*  CALCIUM 9.2 8.5* 8.4* 8.4* 8.4*   GFR: CrCl cannot be calculated (Unknown ideal weight.). Liver Function Tests: Recent Labs  Lab 06/11/21 0503  AST 17  ALT 20  ALKPHOS 47  BILITOT 0.6  PROT 5.4*  ALBUMIN 2.9*   No results for input(s): LIPASE, AMYLASE in the last 168 hours. No results for input(s): AMMONIA in the last 168 hours. Coagulation Profile: No results for input(s): INR, PROTIME in the last 168 hours. Cardiac Enzymes: No results for input(s): CKTOTAL, CKMB, CKMBINDEX, TROPONINI in the last 168 hours.  BNP (last 3 results) No results for input(s): PROBNP in the last 8760 hours. HbA1C: No results for input(s): HGBA1C in the last 72 hours. CBG: No results for input(s): GLUCAP in the last 168 hours. Lipid Profile: No results for input(s): CHOL, HDL, LDLCALC, TRIG, CHOLHDL, LDLDIRECT in the last 72 hours. Thyroid Function Tests: No results for input(s): TSH, T4TOTAL, FREET4, T3FREE, THYROIDAB in the last 72 hours. Anemia Panel: No results for input(s): VITAMINB12, FOLATE, FERRITIN, TIBC, IRON, RETICCTPCT in the last 72 hours. Sepsis Labs: No results for input(s): PROCALCITON, LATICACIDVEN in the last 168 hours.  Recent Results (from the past 240 hour(s))  Resp Panel by RT-PCR (Flu A&B, Covid) Nasopharyngeal Swab     Status: Abnormal   Collection Time: 06/04/21  1:26 PM   Specimen: Nasopharyngeal Swab; Nasopharyngeal(NP) swabs in vial transport medium  Result Value Ref  Range Status   SARS Coronavirus 2 by RT PCR POSITIVE (A) NEGATIVE Final    Comment: RESULT CALLED TO, READ BACK BY AND VERIFIED WITH: DANIEL,C. RN AT 1726 06/04/21 MULLINS,T (NOTE) SARS-CoV-2 target nucleic acids are DETECTED.  The SARS-CoV-2 RNA is generally detectable in upper respiratory specimens during the acute phase of infection. Positive results are indicative of the presence of the identified virus, but do not rule out bacterial infection or co-infection with other pathogens not detected by the test. Clinical correlation with patient history and other diagnostic information is necessary to determine patient infection status. The expected result is Negative.  Fact Sheet for Patients: EntrepreneurPulse.com.au  Fact Sheet for Healthcare Providers: IncredibleEmployment.be  This test is not yet approved or cleared by the [place] FDA and  has been authorized for detection and/or diagnosis of SARS-CoV-2 by FDA under an Emergency Use Authorization (EUA).  This EUA will remain in effect (meaning this test  can  be used) for the duration of  the COVID-19 declaration under Section 564(b)(1) of the Act, 21 U.S.C. section 360bbb-3(b)(1), unless the authorization is terminated or revoked sooner.     Influenza A by PCR NEGATIVE NEGATIVE Final   Influenza B by PCR NEGATIVE NEGATIVE Final    Comment: (NOTE) The Xpert Xpress SARS-CoV-2/FLU/RSV plus assay is intended as an aid in the diagnosis of influenza from Nasopharyngeal swab specimens and should not be used as a sole basis for treatment. Nasal washings and aspirates are unacceptable for Xpert Xpress SARS-CoV-2/FLU/RSV testing.  Fact Sheet for Patients: EntrepreneurPulse.com.au  Fact Sheet for Healthcare Providers: IncredibleEmployment.be  This test is not yet approved or cleared by the [place] FDA and has been authorized for detection and/or  diagnosis of SARS-CoV-2 by FDA under an Emergency Use Authorization (EUA). This EUA will remain in effect (meaning this test can be used) for the duration of the COVID-19 declaration under Section 564(b)(1) of the Act, 21 U.S.C. section 360bbb-3(b)(1), unless the authorization is terminated or revoked.  Performed at [facility], [place] [address] [place], [place] [address]          Radiology Studies: No results found.      Scheduled Meds:  atorvastatin  80 mg Oral Daily   buPROPion ER  100 mg Oral BID   Chlorhexidine Gluconate Cloth  6 each Topical Daily   clopidogrel  75 mg Oral QPM   diltiazem  180 mg Oral Daily   escitalopram  10 mg Oral Daily   lisinopril  5 mg Oral Daily   loratadine  10 mg Oral Daily   pantoprazole  40 mg Oral Daily   tamsulosin  0.4 mg Oral Daily   traZODone  100 mg Oral QHS   vitamin B-12  500 mcg Oral Daily   Continuous Infusions:   LOS: 7 days   Desma Maxim, MD 06/12/2021, 2:02 PM

## 2021-06-13 DIAGNOSIS — R1312 Dysphagia, oropharyngeal phase: Secondary | ICD-10-CM | POA: Diagnosis not present

## 2021-06-13 DIAGNOSIS — F0390 Unspecified dementia without behavioral disturbance: Secondary | ICD-10-CM | POA: Diagnosis not present

## 2021-06-13 DIAGNOSIS — R5381 Other malaise: Secondary | ICD-10-CM | POA: Diagnosis not present

## 2021-06-13 DIAGNOSIS — G9341 Metabolic encephalopathy: Secondary | ICD-10-CM | POA: Diagnosis not present

## 2021-06-13 DIAGNOSIS — N1832 Chronic kidney disease, stage 3b: Secondary | ICD-10-CM | POA: Diagnosis not present

## 2021-06-13 DIAGNOSIS — R0602 Shortness of breath: Secondary | ICD-10-CM | POA: Diagnosis not present

## 2021-06-13 DIAGNOSIS — E875 Hyperkalemia: Secondary | ICD-10-CM | POA: Diagnosis not present

## 2021-06-13 DIAGNOSIS — I69828 Other speech and language deficits following other cerebrovascular disease: Secondary | ICD-10-CM | POA: Diagnosis not present

## 2021-06-13 DIAGNOSIS — R7303 Prediabetes: Secondary | ICD-10-CM | POA: Diagnosis not present

## 2021-06-13 DIAGNOSIS — N179 Acute kidney failure, unspecified: Secondary | ICD-10-CM | POA: Diagnosis not present

## 2021-06-13 DIAGNOSIS — E785 Hyperlipidemia, unspecified: Secondary | ICD-10-CM | POA: Diagnosis not present

## 2021-06-13 DIAGNOSIS — M6281 Muscle weakness (generalized): Secondary | ICD-10-CM | POA: Diagnosis not present

## 2021-06-13 DIAGNOSIS — E46 Unspecified protein-calorie malnutrition: Secondary | ICD-10-CM | POA: Diagnosis not present

## 2021-06-13 DIAGNOSIS — D509 Iron deficiency anemia, unspecified: Secondary | ICD-10-CM | POA: Diagnosis not present

## 2021-06-13 DIAGNOSIS — R2689 Other abnormalities of gait and mobility: Secondary | ICD-10-CM | POA: Diagnosis not present

## 2021-06-13 DIAGNOSIS — R32 Unspecified urinary incontinence: Secondary | ICD-10-CM | POA: Diagnosis not present

## 2021-06-13 DIAGNOSIS — M1990 Unspecified osteoarthritis, unspecified site: Secondary | ICD-10-CM | POA: Diagnosis not present

## 2021-06-13 DIAGNOSIS — F03918 Unspecified dementia, unspecified severity, with other behavioral disturbance: Secondary | ICD-10-CM | POA: Diagnosis not present

## 2021-06-13 DIAGNOSIS — R0902 Hypoxemia: Secondary | ICD-10-CM | POA: Diagnosis not present

## 2021-06-13 DIAGNOSIS — I129 Hypertensive chronic kidney disease with stage 1 through stage 4 chronic kidney disease, or unspecified chronic kidney disease: Secondary | ICD-10-CM | POA: Diagnosis not present

## 2021-06-13 DIAGNOSIS — I69891 Dysphagia following other cerebrovascular disease: Secondary | ICD-10-CM | POA: Diagnosis not present

## 2021-06-13 DIAGNOSIS — R339 Retention of urine, unspecified: Secondary | ICD-10-CM | POA: Diagnosis not present

## 2021-06-13 DIAGNOSIS — I69398 Other sequelae of cerebral infarction: Secondary | ICD-10-CM | POA: Diagnosis not present

## 2021-06-13 DIAGNOSIS — R41841 Cognitive communication deficit: Secondary | ICD-10-CM | POA: Diagnosis not present

## 2021-06-13 DIAGNOSIS — Z7401 Bed confinement status: Secondary | ICD-10-CM | POA: Diagnosis not present

## 2021-06-13 DIAGNOSIS — F330 Major depressive disorder, recurrent, mild: Secondary | ICD-10-CM | POA: Diagnosis not present

## 2021-06-13 DIAGNOSIS — I959 Hypotension, unspecified: Secondary | ICD-10-CM | POA: Diagnosis not present

## 2021-06-13 LAB — CBC
HCT: 29.4 % — ABNORMAL LOW (ref 36.0–46.0)
Hemoglobin: 9.6 g/dL — ABNORMAL LOW (ref 12.0–15.0)
MCH: 32.1 pg (ref 26.0–34.0)
MCHC: 32.7 g/dL (ref 30.0–36.0)
MCV: 98.3 fL (ref 80.0–100.0)
Platelets: 193 10*3/uL (ref 150–400)
RBC: 2.99 MIL/uL — ABNORMAL LOW (ref 3.87–5.11)
RDW: 15.3 % (ref 11.5–15.5)
WBC: 13.6 10*3/uL — ABNORMAL HIGH (ref 4.0–10.5)
nRBC: 0 % (ref 0.0–0.2)

## 2021-06-13 LAB — BASIC METABOLIC PANEL
Anion gap: 5 (ref 5–15)
BUN: 31 mg/dL — ABNORMAL HIGH (ref 8–23)
CO2: 24 mmol/L (ref 22–32)
Calcium: 8.5 mg/dL — ABNORMAL LOW (ref 8.9–10.3)
Chloride: 112 mmol/L — ABNORMAL HIGH (ref 98–111)
Creatinine, Ser: 1.54 mg/dL — ABNORMAL HIGH (ref 0.44–1.00)
GFR, Estimated: 33 mL/min — ABNORMAL LOW (ref >60)
Glucose, Bld: 118 mg/dL — ABNORMAL HIGH (ref 70–99)
Potassium: 4.1 mmol/L (ref 3.5–5.1)
Sodium: 141 mmol/L (ref 135–145)

## 2021-06-13 MED ORDER — ALUM & MAG HYDROXIDE-SIMETH 200-200-20 MG/5ML PO SUSP
30.0000 mL | ORAL | Status: DC | PRN
  Administered 2021-06-13: 30 mL via ORAL
  Filled 2021-06-13: qty 30

## 2021-06-13 MED ORDER — LISINOPRIL 10 MG PO TABS
10.0000 mg | ORAL_TABLET | Freq: Every day | ORAL | Status: DC
Start: 2021-06-13 — End: 2023-03-23

## 2021-06-13 MED ORDER — GERHARDT'S BUTT CREAM
TOPICAL_CREAM | Freq: Three times a day (TID) | CUTANEOUS | Status: DC
  Administered 2021-06-13: 1 via TOPICAL
  Filled 2021-06-13: qty 1

## 2021-06-13 MED ORDER — TAMSULOSIN HCL 0.4 MG PO CAPS: 0.4000 mg | ORAL_CAPSULE | Freq: Every day | ORAL | Status: AC

## 2021-06-13 NOTE — Consult Note (Signed)
[facility] Nurse Consult Note: Patient receiving care in [facility]--patient is COVID +. Reason for Consult:excoriation to bilateral buttocks Wound type: MASD-IAD, confirmed by primary RN Kasia. Pressure Injury POA: Yes/No/NA Measurement: Wound bed: "excoriated" per primary RN Drainage (amount, consistency, odor)  Periwound: Dressing procedure/placement/frequency: Apply Gerhardt's butt cream to all reddened areas on the buttocks, sacrum, coccyx, labia AFTER washing with soap and water and patting dry.  Monitor the wound area(s) for worsening of condition such as: Signs/symptoms of infection,  Increase in size,  Development of or worsening of odor, Development of pain, or increased pain at the affected locations.  Notify the medical team if any of these develop.  Thank you for the consult.  Discussed plan of care with the bedside nurse.  [place] nurse will not follow at this time.  Please re-consult the [place] team if needed.  Val Riles, RN, MSN, CWOCN, CNS-BC, pager [phone]

## 2021-06-13 NOTE — Care Management Important Message (Signed)
Important Message  Patient Details IM Letter placed in Patients room. Name: Carol Page MRN: [id number] Date of Birth: [birth year]   Medicare Important Message Given:  Yes     Kerin Salen 06/13/2021, 12:36 PM

## 2021-06-13 NOTE — Discharge Summary (Signed)
Carol Page YPP:[id number] DOB: 12/06/1935 DOA: 06/04/2021  PCP: Jearld Fenton, NP  Admit date: 06/04/2021 Discharge date: 06/13/2021  Time spent: 45 minutes  Recommendations for Outpatient Follow-up:  Will need urology f/u in 1-2 weeks Check bmp in 1 week     Discharge Diagnoses:  Principal Problem:   AKI (acute kidney injury) ([place]) Active Problems:   Essential hypertension   HLD (hyperlipidemia)   Pre-diabetes   Anxiety and depression   CKD (chronic kidney disease) stage 3, GFR 30-59 ml/min (HCC)   Iron deficiency anemia   Hyperkalemia   Discharge Condition: stable  Diet recommendation: heart healthy  There were no vitals filed for this visit.  History of present illness:  Carol Page is a 85 y.o. female with medical history significant of seasonal allergies, osteoarthritis, cellulitis of the left leg, depression, hyperlipidemia, hypertension, prediabetes, proctitis, nasal skin cancer, history of other non-hemorrhagic stroke, stage 3b CKD, urinary incontinence who is coming to the emergency department via EMS from home due to confusion and hallucinations for the past 2 to 3 days.  The patient's daughter also stated that he has been progressively weaker for the past 3 weeks.  He has a history of UTIs in the past.  She has not had much oral intake for the past 3 days.  She is able to answer simple questions and denied headache, sore throat, back, chest or abdominal pain.  She was diagnosed with COVID-19 last week.  Hospital Course:  # COVID 19 infection   Asymptomatic. No hypoxia but CXR shows pneumonitis; pt received 5 days of decadron. Infection resolved, is now >10 days from start so OK to d/c precautions.   # Mild acute metabolic encephalopathy in the setting of underlying dementia.  Probably from poor oral intake and dehydration and AKI. MRI brain without contrast ordered, negative for acute stroke. B12 and tsh wnl. Appears to have returned to baseline - d/c to  snf  # Acute Urinary retention:  Foley catheter placed after 3 in and out cath insertion. She was started on flomax.  - Will need outpatient urology f/u   # Mild rhabdomyolysis: Resolved with hydration.   # Hypertension: hctz held and lisinopril increased to 10, will need bmp in 1 week  # Hx CVA - cont plavix     Procedures: none   Consultations: none  Discharge Exam: Vitals:   06/12/21 2150 06/13/21 0700  BP: (!) 151/52 (!) 138/48  Pulse: 78 86  Resp: (!) 22 (!) 22  Temp: 98.5 F (36.9 C) 98.5 F (36.9 C)  SpO2: 92% 96%    General exam: Appears calm and comfortable  Respiratory system: Clear to auscultation. Respiratory effort normal. Cardiovascular system: S1 & S2 heard, RRR. No JVD, murmurs, rubs, gallops or clicks.  No pedal edema. Gastrointestinal system: Abdomen is nondistended, soft and nontender. No organomegaly or masses felt. Normal bowel sounds heard. Central nervous system: Alert and oriented to person and place. No focal neurological deficits. Skin: No rashes, lesions or ulcers Psychiatry: Unable to assess. calm  Discharge Instructions   Discharge Instructions     Diet - low sodium heart healthy   Complete by: As directed    Increase activity slowly   Complete by: As directed       Allergies as of 06/13/2021       Reactions   Codeine Nausea And Vomiting   Ether Nausea And Vomiting        Medication List     STOP taking these  medications    gabapentin 300 MG capsule Commonly known as: NEURONTIN   hydrochlorothiazide 12.5 MG capsule Commonly known as: MICROZIDE   potassium chloride SA 20 MEQ tablet Commonly known as: KLOR-CON       TAKE these medications    atorvastatin 80 MG tablet Commonly known as: LIPITOR Take 1 tablet by mouth once daily What changed: when to take this   buPROPion ER 100 MG 12 hr tablet Commonly known as: Wellbutrin SR Take 1 tablet (100 mg total) by mouth 2 (two) times daily. What changed: when  to take this   CALCIUM PO Take 1 tablet by mouth daily.   clopidogrel 75 MG tablet Commonly known as: PLAVIX Take 1 tablet by mouth in the evening   diltiazem 180 MG 24 hr capsule Commonly known as: CARDIZEM CD Take 1 capsule by mouth once daily   escitalopram 10 MG tablet Commonly known as: LEXAPRO Take 1 tablet (10 mg total) by mouth daily.   ferrous sulfate 324 (65 Fe) MG Tbec Take 324 mg by mouth See admin instructions. Take 324 mg (65 Fe) by mouth in the morning with breakfast on Monday, Wednesday, and Friday only   Krill Oil 500 MG Caps Take 500 mg by mouth in the morning and at bedtime.   lisinopril 10 MG tablet Commonly known as: ZESTRIL Take 1 tablet (10 mg total) by mouth daily. What changed:  medication strength how much to take   loratadine 10 MG tablet Commonly known as: CLARITIN Take 10 mg by mouth daily.   pantoprazole 40 MG tablet Commonly known as: PROTONIX Take 1 tablet by mouth once daily What changed: when to take this   polyethylene glycol 17 g packet Commonly known as: MIRALAX / GLYCOLAX Take 17 g by mouth daily as needed for mild constipation. What changed: reasons to take this   tamsulosin 0.4 MG Caps capsule Commonly known as: FLOMAX Take 1 capsule (0.4 mg total) by mouth daily.   traZODone 50 MG tablet Commonly known as: DESYREL Take 2 tablets (100 mg total) by mouth at bedtime.   vitamin B-12 500 MCG tablet Commonly known as: CYANOCOBALAMIN Take 500 mcg by mouth daily.   Vitamin D-3 25 MCG (1000 UT) Caps Take 1,000 Units by mouth daily.       Allergies  Allergen Reactions   Codeine Nausea And Vomiting   Ether Nausea And Vomiting    Contact information for after-discharge care     Destination     [facility] Preferred SNF .   Service: Skilled Nursing Contact information: [address] [place] [phone]                      The results of significant  diagnostics from this hospitalization (including imaging, microbiology, ancillary and laboratory) are listed below for reference.    Significant Diagnostic Studies: CT Head Wo Contrast  Result Date: 06/04/2021 CLINICAL DATA:  Mental status change of unknown etiology. EXAM: CT HEAD WITHOUT CONTRAST TECHNIQUE: Contiguous axial images were obtained from the base of the skull through the vertex without intravenous contrast. COMPARISON:  04/21/2018 FINDINGS: Brain: No evidence of acute infarction, hemorrhage, hydrocephalus, extra-axial collection or mass lesion/mass effect. There is diffuse low-attenuation within the subcortical and periventricular white matter compatible with chronic microvascular disease. Vascular: No hyperdense vessel or unexpected calcification. Skull: Normal. Negative for fracture or focal lesion. Sinuses/Orbits: No acute finding. Other: None. IMPRESSION: 1. No acute intracranial abnormalities. 2.  Chronic small vessel ischemic change. Electronically Signed   By: Kerby Moors M.D.   On: 06/04/2021 14:47   MR BRAIN WO CONTRAST  Result Date: 06/08/2021 CLINICAL DATA:  This study identified missing a report at 9:47 am on 06/08/2021. 85 year old female with unexplained altered mental status. EXAM: MRI HEAD WITHOUT CONTRAST TECHNIQUE: Multiplanar, multiecho pulse sequences of the brain and surrounding structures were obtained without intravenous contrast. COMPARISON:  Head CTs 06/04/2021 and earlier.  Brain MRI 04/26/2018. FINDINGS: Study is moderately degraded by motion artifact despite repeated imaging attempts. Brain: No restricted diffusion to suggest acute infarction. No midline shift, mass effect, evidence of mass lesion, ventriculomegaly, extra-axial collection or acute intracranial hemorrhage. Cervicomedullary junction and pituitary are within normal limits. Cerebral volume not significantly changed since 2019. Patchy and confluent bilateral cerebral white matter T2 and FLAIR  hyperintensity also appears stable. Small perivascular spaces versus chronic lacunar infarcts in the bilateral globus pallidus. No definite cortical encephalomalacia or chronic cerebral blood products. Vascular: Major intracranial vascular flow voids appear stable. Skull and upper cervical spine: Grossly stable. No acute osseous abnormality identified. Sinuses/Orbits: Orbits appear stable, negative. Mild paranasal sinus inflammation including a small right sphenoid sinus fluid level, new since 2019. Other: But mild bilateral mastoid air cell effusions are chronic and not significantly changed since 2019. Grossly negative visible scalp and face. IMPRESSION: 1. Moderately degraded by motion despite repeated imaging attempts. 2. No acute intracranial abnormality, grossly stable noncontrast MRI appearance of the brain since 2019. Electronically Signed   By: Genevie Ann M.D.   On: 06/08/2021 09:51   US RENAL  Result Date: 06/06/2021 CLINICAL DATA:  Acute kidney injury EXAM: RENAL / URINARY TRACT ULTRASOUND COMPLETE COMPARISON:  CT abdomen pelvis 04/19/2018, ultrasound 01/09/2021 FINDINGS: Right Kidney: Renal measurements: 8.1 x 4.8 x 3.5 cm = volume: 71 mL. Renal cortical thinning. Echogenicity increased. There is a 2.1 cm simple cystic lesion. No associated mural nodularity, vascularity, septation. No mass or hydronephrosis visualized. Left Kidney: Renal measurements: 9.8 x 4.4 x 5 = volume: 107 mL. Renal cortical thinning. Echogenicity increased. No mass or hydronephrosis visualized. Urinary bladder: Urinary bladder diverticula. Appears normal for degree of bladder distention. Prevoid volume of 673 mL. Other: None. IMPRESSION: 1. Urinary bladder diverticula. 2. Increased renal parenchymal echogenicity suggestive of renal parenchymal disease bilaterally. Electronically Signed   By: Iven Finn M.D.   On: 06/06/2021 15:14   DG CHEST PORT 1 VIEW  Result Date: 06/06/2021 CLINICAL DATA:  Cough, COVID positive  EXAM: PORTABLE CHEST 1 VIEW COMPARISON:  Previous studies including the examination of 01/07/2021 FINDINGS: There is poor inspiration. There are no signs of alveolar pulmonary edema. Increased markings are seen in both lower lung fields. Central pulmonary vessels are prominent without signs of alveolar pulmonary edema. There is no significant pleural effusion or pneumothorax. IMPRESSION: Poor inspiration. Increased markings are seen in both lower lung fields suggesting atelectasis/pneumonitis. Part of this finding may be due to poor inspiration. Central pulmonary vessels are prominent without signs of alveolar pulmonary edema. Electronically Signed   By: Elmer Picker M.D.   On: 06/06/2021 10:51    Microbiology: Recent Results (from the past 240 hour(s))  Resp Panel by RT-PCR (Flu A&B, Covid) Nasopharyngeal Swab     Status: Abnormal   Collection Time: 06/04/21  1:26 PM   Specimen: Nasopharyngeal Swab; Nasopharyngeal(NP) swabs in vial transport medium  Result Value Ref Range Status   SARS Coronavirus 2 by RT PCR POSITIVE (A) NEGATIVE Final    Comment:  RESULT CALLED TO, READ BACK BY AND VERIFIED WITH: DANIEL,C. RN AT 0865 06/04/21 MULLINS,T (NOTE) SARS-CoV-2 target nucleic acids are DETECTED.  The SARS-CoV-2 RNA is generally detectable in upper respiratory specimens during the acute phase of infection. Positive results are indicative of the presence of the identified virus, but do not rule out bacterial infection or co-infection with other pathogens not detected by the test. Clinical correlation with patient history and other diagnostic information is necessary to determine patient infection status. The expected result is Negative.  Fact Sheet for Patients: EntrepreneurPulse.com.au  Fact Sheet for Healthcare Providers: IncredibleEmployment.be  This test is not yet approved or cleared by the [place] FDA and  has been authorized for detection  and/or diagnosis of SARS-CoV-2 by FDA under an Emergency Use Authorization (EUA).  This EUA will remain in effect (meaning this test  can be used) for the duration of  the COVID-19 declaration under Section 564(b)(1) of the Act, 21 U.S.C. section 360bbb-3(b)(1), unless the authorization is terminated or revoked sooner.     Influenza A by PCR NEGATIVE NEGATIVE Final   Influenza B by PCR NEGATIVE NEGATIVE Final    Comment: (NOTE) The Xpert Xpress SARS-CoV-2/FLU/RSV plus assay is intended as an aid in the diagnosis of influenza from Nasopharyngeal swab specimens and should not be used as a sole basis for treatment. Nasal washings and aspirates are unacceptable for Xpert Xpress SARS-CoV-2/FLU/RSV testing.  Fact Sheet for Patients: EntrepreneurPulse.com.au  Fact Sheet for Healthcare Providers: IncredibleEmployment.be  This test is not yet approved or cleared by the [place] FDA and has been authorized for detection and/or diagnosis of SARS-CoV-2 by FDA under an Emergency Use Authorization (EUA). This EUA will remain in effect (meaning this test can be used) for the duration of the COVID-19 declaration under Section 564(b)(1) of the Act, 21 U.S.C. section 360bbb-3(b)(1), unless the authorization is terminated or revoked.  Performed at [facility], [place] [address], [place], [place] [address]      Labs: Basic Metabolic Panel: Recent Labs  Lab 06/07/21 0518 06/08/21 0909 06/09/21 0908 06/11/21 0503 06/13/21 0452  NA 137 137 139 141 141  K 4.8 3.9 4.0 3.8 4.1  CL 110 110 112* 113* 112*  CO2 20* 22 21* 22 24  GLUCOSE 125* 119* 95 86 118*  BUN 52* 46* 45* 37* 31*  CREATININE 1.93* 1.58* 1.41* 1.41* 1.54*  CALCIUM 8.5* 8.4* 8.4* 8.4* 8.5*   Liver Function Tests: Recent Labs  Lab 06/11/21 0503  AST 17  ALT 20  ALKPHOS 47  BILITOT 0.6  PROT 5.4*  ALBUMIN 2.9*   No results for input(s): LIPASE, AMYLASE in the  last 168 hours. No results for input(s): AMMONIA in the last 168 hours. CBC: Recent Labs  Lab 06/11/21 0503 06/13/21 0452  WBC 8.5 13.6*  HGB 9.4* 9.6*  HCT 28.3* 29.4*  MCV 97.6 98.3  PLT 217 193   Cardiac Enzymes: No results for input(s): CKTOTAL, CKMB, CKMBINDEX, TROPONINI in the last 168 hours. BNP: BNP (last 3 results) No results for input(s): BNP in the last 8760 hours.  ProBNP (last 3 results) No results for input(s): PROBNP in the last 8760 hours.  CBG: No results for input(s): GLUCAP in the last 168 hours.     Signed:  Desma Maxim MD.  [facility] 06/13/2021, 10:35 AM

## 2021-06-13 NOTE — TOC Transition Note (Signed)
Transition of Care [facility]) - CM/SW Discharge Note   Patient Details  Name: Carol Page MRN: [id number] Date of Birth: 27-Nov-1935  [facility] CM/SW Contact:  Trish Mage, LCSW Phone Number: 06/13/2021, 10:41 AM   Clinical Narrative:   Patient who is stable for d/c will transfer to [facility] today.  Family alerted.  PTAR arranged.  Nursing, please call report to [phone]. TOC sign off.    Final next level of care: Skilled Nursing Facility Barriers to Discharge: Barriers Resolved   Patient Goals and CMS Choice     Choice offered to / list presented to : Adult Children  Discharge Placement                       Discharge Plan and Services   Discharge Planning Services: CM Consult Post Acute Care Choice: Skilled Nursing Facility                               Social Determinants of Health (SDOH) Interventions     Readmission Risk Interventions No flowsheet data found.

## 2021-06-13 NOTE — Progress Notes (Signed)
Pt discharged to [facility] via [facility]. RN called report to Therapist, sports at [employer]. Pt's Oxygen levels dropped to 81% when PTAR arrived and pt took a few deep breaths and oxygen increased to 94%. MD aware and at bedside. Discharge instructions and medication instructions placed in discharge packet.

## 2021-06-13 NOTE — Plan of Care (Signed)
  Problem: Education: Goal: Knowledge of General Education information will improve Description: Including pain rating scale, medication(s)/side effects and non-pharmacologic comfort measures Outcome: Progressing   Problem: Health Behavior/Discharge Planning: Goal: Ability to manage health-related needs will improve Outcome: Progressing   Problem: Clinical Measurements: Goal: Ability to maintain clinical measurements within normal limits will improve Outcome: Progressing Goal: Will remain free from infection Outcome: Progressing Goal: Diagnostic test results will improve Outcome: Progressing Goal: Cardiovascular complication will be avoided Outcome: Progressing   Problem: Activity: Goal: Risk for activity intolerance will decrease Outcome: Progressing   Problem: Nutrition: Goal: Adequate nutrition will be maintained Outcome: Progressing   Problem: Elimination: Goal: Will not experience complications related to bowel motility Outcome: Progressing   Problem: Pain Managment: Goal: General experience of comfort will improve Outcome: Progressing   Problem: Safety: Goal: Ability to remain free from injury will improve Outcome: Progressing   Problem: Skin Integrity: Goal: Risk for impaired skin integrity will decrease Outcome: Progressing   Problem: Education: Goal: Knowledge of risk factors and measures for prevention of condition will improve Outcome: Progressing   Problem: Coping: Goal: Psychosocial and spiritual needs will be supported Outcome: Progressing   Problem: Respiratory: Goal: Will maintain a patent airway Outcome: Progressing Goal: Complications related to the disease process, condition or treatment will be avoided or minimized Outcome: Progressing

## 2021-06-16 DIAGNOSIS — R5381 Other malaise: Secondary | ICD-10-CM | POA: Diagnosis not present

## 2021-06-16 DIAGNOSIS — F330 Major depressive disorder, recurrent, mild: Secondary | ICD-10-CM | POA: Diagnosis not present

## 2021-06-16 DIAGNOSIS — F0390 Unspecified dementia without behavioral disturbance: Secondary | ICD-10-CM | POA: Diagnosis not present

## 2021-06-16 DIAGNOSIS — E46 Unspecified protein-calorie malnutrition: Secondary | ICD-10-CM | POA: Diagnosis not present

## 2021-06-18 ENCOUNTER — Telehealth: Payer: Self-pay

## 2021-06-18 ENCOUNTER — Encounter: Payer: Self-pay | Admitting: *Deleted

## 2021-06-18 NOTE — Telephone Encounter (Signed)
Patients daughter called advising patient is currently in a rehab center  after being in the hospital and she will call once she is home to make an appt. Patients daughter was advised is she needed to be seen before then to call and make a new patient appt.

## 2021-06-19 DIAGNOSIS — R7303 Prediabetes: Secondary | ICD-10-CM | POA: Diagnosis not present

## 2021-06-19 DIAGNOSIS — E785 Hyperlipidemia, unspecified: Secondary | ICD-10-CM | POA: Diagnosis not present

## 2021-06-19 DIAGNOSIS — R32 Unspecified urinary incontinence: Secondary | ICD-10-CM | POA: Diagnosis not present

## 2021-06-19 DIAGNOSIS — M6281 Muscle weakness (generalized): Secondary | ICD-10-CM | POA: Diagnosis not present

## 2021-06-19 DIAGNOSIS — I129 Hypertensive chronic kidney disease with stage 1 through stage 4 chronic kidney disease, or unspecified chronic kidney disease: Secondary | ICD-10-CM | POA: Diagnosis not present

## 2021-06-19 DIAGNOSIS — R5381 Other malaise: Secondary | ICD-10-CM | POA: Diagnosis not present

## 2021-06-19 DIAGNOSIS — D509 Iron deficiency anemia, unspecified: Secondary | ICD-10-CM | POA: Diagnosis not present

## 2021-06-19 DIAGNOSIS — M1990 Unspecified osteoarthritis, unspecified site: Secondary | ICD-10-CM | POA: Diagnosis not present

## 2021-06-19 DIAGNOSIS — R2689 Other abnormalities of gait and mobility: Secondary | ICD-10-CM | POA: Diagnosis not present

## 2021-06-19 DIAGNOSIS — I69891 Dysphagia following other cerebrovascular disease: Secondary | ICD-10-CM | POA: Diagnosis not present

## 2021-06-19 DIAGNOSIS — F330 Major depressive disorder, recurrent, mild: Secondary | ICD-10-CM | POA: Diagnosis not present

## 2021-06-19 DIAGNOSIS — R41841 Cognitive communication deficit: Secondary | ICD-10-CM | POA: Diagnosis not present

## 2021-06-19 DIAGNOSIS — I69828 Other speech and language deficits following other cerebrovascular disease: Secondary | ICD-10-CM | POA: Diagnosis not present

## 2021-06-19 DIAGNOSIS — F03918 Unspecified dementia, unspecified severity, with other behavioral disturbance: Secondary | ICD-10-CM | POA: Diagnosis not present

## 2021-06-19 DIAGNOSIS — N179 Acute kidney failure, unspecified: Secondary | ICD-10-CM | POA: Diagnosis not present

## 2021-06-19 DIAGNOSIS — R339 Retention of urine, unspecified: Secondary | ICD-10-CM | POA: Diagnosis not present

## 2021-06-19 DIAGNOSIS — E875 Hyperkalemia: Secondary | ICD-10-CM | POA: Diagnosis not present

## 2021-06-19 DIAGNOSIS — N1832 Chronic kidney disease, stage 3b: Secondary | ICD-10-CM | POA: Diagnosis not present

## 2021-06-19 DIAGNOSIS — R1312 Dysphagia, oropharyngeal phase: Secondary | ICD-10-CM | POA: Diagnosis not present

## 2021-06-19 DIAGNOSIS — G9341 Metabolic encephalopathy: Secondary | ICD-10-CM | POA: Diagnosis not present

## 2021-06-19 DIAGNOSIS — I69398 Other sequelae of cerebral infarction: Secondary | ICD-10-CM | POA: Diagnosis not present

## 2021-06-27 DIAGNOSIS — F03918 Unspecified dementia, unspecified severity, with other behavioral disturbance: Secondary | ICD-10-CM | POA: Diagnosis not present

## 2021-06-27 DIAGNOSIS — F330 Major depressive disorder, recurrent, mild: Secondary | ICD-10-CM | POA: Diagnosis not present

## 2021-06-27 DIAGNOSIS — R339 Retention of urine, unspecified: Secondary | ICD-10-CM | POA: Diagnosis not present

## 2021-06-27 DIAGNOSIS — R5381 Other malaise: Secondary | ICD-10-CM | POA: Diagnosis not present

## 2021-07-01 ENCOUNTER — Other Ambulatory Visit: Payer: Self-pay | Admitting: *Deleted

## 2021-07-01 NOTE — Patient Outreach (Signed)
Member screened for potential [facility] care coordination services. Mrs. Stahl resides in [facility]  Communication sent to [facility] to inqure about transition plans.   Will continue to follow while member resides in SNF.    [name], MSN, RN,BSN [place] Acute Care Coordinator [phone] [facility]) [phone]  (Toll free office)

## 2021-07-02 ENCOUNTER — Other Ambulatory Visit: Payer: Self-pay | Admitting: *Deleted

## 2021-07-02 NOTE — Patient Outreach (Addendum)
[facility] Post- Acute Care Coordinator follow up. Member screened for potential [facility] Care Management needs. Mrs. Janssens resides in [facility].   Update received from [place] [place] [facility] SW indicating member likely to remain LTC. However, transition plans may change.   Will continue to follow transition plans and for potential [facility] services.    [name], MSN, RN,BSN [place] Acute Care Coordinator [phone] [facility]) [phone]  (Toll free office)

## 2021-07-03 DIAGNOSIS — N312 Flaccid neuropathic bladder, not elsewhere classified: Secondary | ICD-10-CM | POA: Diagnosis not present

## 2021-07-17 ENCOUNTER — Other Ambulatory Visit: Payer: Self-pay | Admitting: *Deleted

## 2021-07-17 NOTE — Patient Outreach (Signed)
Carol Page Coordinator follow up. Member screened for potential [name] Care Management needs.   Per [facility] Carol Page transitioned to private pay at [facility]. Previous SNF SW report indicated member will likely transition to long term care.  There are no identifiable [facility] Care Management needs at this time.   [name], MSN, RN,BSN [place] Acute Care Coordinator [phone] [facility]) [phone]  (Toll free office)

## 2021-07-22 DIAGNOSIS — E875 Hyperkalemia: Secondary | ICD-10-CM | POA: Diagnosis not present

## 2021-07-22 DIAGNOSIS — N179 Acute kidney failure, unspecified: Secondary | ICD-10-CM | POA: Diagnosis not present

## 2021-07-22 DIAGNOSIS — G9341 Metabolic encephalopathy: Secondary | ICD-10-CM | POA: Diagnosis not present

## 2021-07-22 DIAGNOSIS — I129 Hypertensive chronic kidney disease with stage 1 through stage 4 chronic kidney disease, or unspecified chronic kidney disease: Secondary | ICD-10-CM | POA: Diagnosis not present

## 2021-07-22 DIAGNOSIS — N1832 Chronic kidney disease, stage 3b: Secondary | ICD-10-CM | POA: Diagnosis not present

## 2021-07-31 DIAGNOSIS — F330 Major depressive disorder, recurrent, mild: Secondary | ICD-10-CM | POA: Diagnosis not present

## 2021-07-31 DIAGNOSIS — E46 Unspecified protein-calorie malnutrition: Secondary | ICD-10-CM | POA: Diagnosis not present

## 2021-07-31 DIAGNOSIS — N1832 Chronic kidney disease, stage 3b: Secondary | ICD-10-CM | POA: Diagnosis not present

## 2021-08-01 DIAGNOSIS — I10 Essential (primary) hypertension: Secondary | ICD-10-CM | POA: Diagnosis not present

## 2021-08-01 DIAGNOSIS — R7309 Other abnormal glucose: Secondary | ICD-10-CM | POA: Diagnosis not present

## 2021-08-13 DIAGNOSIS — N3941 Urge incontinence: Secondary | ICD-10-CM | POA: Diagnosis not present

## 2021-08-13 DIAGNOSIS — R3914 Feeling of incomplete bladder emptying: Secondary | ICD-10-CM | POA: Diagnosis not present

## 2021-08-13 DIAGNOSIS — N312 Flaccid neuropathic bladder, not elsewhere classified: Secondary | ICD-10-CM | POA: Diagnosis not present

## 2021-09-02 DIAGNOSIS — I2510 Atherosclerotic heart disease of native coronary artery without angina pectoris: Secondary | ICD-10-CM | POA: Diagnosis not present

## 2021-09-02 DIAGNOSIS — E785 Hyperlipidemia, unspecified: Secondary | ICD-10-CM | POA: Diagnosis not present

## 2021-09-02 DIAGNOSIS — I1310 Hypertensive heart and chronic kidney disease without heart failure, with stage 1 through stage 4 chronic kidney disease, or unspecified chronic kidney disease: Secondary | ICD-10-CM | POA: Diagnosis not present

## 2021-09-02 DIAGNOSIS — F339 Major depressive disorder, recurrent, unspecified: Secondary | ICD-10-CM | POA: Diagnosis not present

## 2021-09-19 DIAGNOSIS — E0590 Thyrotoxicosis, unspecified without thyrotoxic crisis or storm: Secondary | ICD-10-CM | POA: Diagnosis not present

## 2021-09-24 DIAGNOSIS — I129 Hypertensive chronic kidney disease with stage 1 through stage 4 chronic kidney disease, or unspecified chronic kidney disease: Secondary | ICD-10-CM | POA: Diagnosis not present

## 2021-09-24 DIAGNOSIS — R2681 Unsteadiness on feet: Secondary | ICD-10-CM | POA: Diagnosis not present

## 2021-09-24 DIAGNOSIS — R2689 Other abnormalities of gait and mobility: Secondary | ICD-10-CM | POA: Diagnosis not present

## 2021-09-24 DIAGNOSIS — M1990 Unspecified osteoarthritis, unspecified site: Secondary | ICD-10-CM | POA: Diagnosis not present

## 2021-09-24 DIAGNOSIS — M6281 Muscle weakness (generalized): Secondary | ICD-10-CM | POA: Diagnosis not present

## 2021-09-25 DIAGNOSIS — R918 Other nonspecific abnormal finding of lung field: Secondary | ICD-10-CM | POA: Diagnosis not present

## 2021-09-25 DIAGNOSIS — E039 Hypothyroidism, unspecified: Secondary | ICD-10-CM | POA: Diagnosis not present

## 2021-09-25 DIAGNOSIS — I129 Hypertensive chronic kidney disease with stage 1 through stage 4 chronic kidney disease, or unspecified chronic kidney disease: Secondary | ICD-10-CM | POA: Diagnosis not present

## 2021-09-25 DIAGNOSIS — R2681 Unsteadiness on feet: Secondary | ICD-10-CM | POA: Diagnosis not present

## 2021-09-25 DIAGNOSIS — M6281 Muscle weakness (generalized): Secondary | ICD-10-CM | POA: Diagnosis not present

## 2021-09-25 DIAGNOSIS — R059 Cough, unspecified: Secondary | ICD-10-CM | POA: Diagnosis not present

## 2021-09-25 DIAGNOSIS — M1990 Unspecified osteoarthritis, unspecified site: Secondary | ICD-10-CM | POA: Diagnosis not present

## 2021-09-25 DIAGNOSIS — R2689 Other abnormalities of gait and mobility: Secondary | ICD-10-CM | POA: Diagnosis not present

## 2021-09-26 DIAGNOSIS — R2681 Unsteadiness on feet: Secondary | ICD-10-CM | POA: Diagnosis not present

## 2021-09-26 DIAGNOSIS — M1990 Unspecified osteoarthritis, unspecified site: Secondary | ICD-10-CM | POA: Diagnosis not present

## 2021-09-26 DIAGNOSIS — R2689 Other abnormalities of gait and mobility: Secondary | ICD-10-CM | POA: Diagnosis not present

## 2021-09-26 DIAGNOSIS — M6281 Muscle weakness (generalized): Secondary | ICD-10-CM | POA: Diagnosis not present

## 2021-09-26 DIAGNOSIS — I129 Hypertensive chronic kidney disease with stage 1 through stage 4 chronic kidney disease, or unspecified chronic kidney disease: Secondary | ICD-10-CM | POA: Diagnosis not present

## 2021-09-27 DIAGNOSIS — I129 Hypertensive chronic kidney disease with stage 1 through stage 4 chronic kidney disease, or unspecified chronic kidney disease: Secondary | ICD-10-CM | POA: Diagnosis not present

## 2021-09-27 DIAGNOSIS — M6281 Muscle weakness (generalized): Secondary | ICD-10-CM | POA: Diagnosis not present

## 2021-09-27 DIAGNOSIS — R2681 Unsteadiness on feet: Secondary | ICD-10-CM | POA: Diagnosis not present

## 2021-09-27 DIAGNOSIS — M1990 Unspecified osteoarthritis, unspecified site: Secondary | ICD-10-CM | POA: Diagnosis not present

## 2021-09-27 DIAGNOSIS — R2689 Other abnormalities of gait and mobility: Secondary | ICD-10-CM | POA: Diagnosis not present

## 2021-09-29 DIAGNOSIS — R2681 Unsteadiness on feet: Secondary | ICD-10-CM | POA: Diagnosis not present

## 2021-09-29 DIAGNOSIS — M1990 Unspecified osteoarthritis, unspecified site: Secondary | ICD-10-CM | POA: Diagnosis not present

## 2021-09-29 DIAGNOSIS — M6281 Muscle weakness (generalized): Secondary | ICD-10-CM | POA: Diagnosis not present

## 2021-09-29 DIAGNOSIS — R2689 Other abnormalities of gait and mobility: Secondary | ICD-10-CM | POA: Diagnosis not present

## 2021-09-29 DIAGNOSIS — I129 Hypertensive chronic kidney disease with stage 1 through stage 4 chronic kidney disease, or unspecified chronic kidney disease: Secondary | ICD-10-CM | POA: Diagnosis not present

## 2021-09-30 DIAGNOSIS — R2681 Unsteadiness on feet: Secondary | ICD-10-CM | POA: Diagnosis not present

## 2021-09-30 DIAGNOSIS — R2689 Other abnormalities of gait and mobility: Secondary | ICD-10-CM | POA: Diagnosis not present

## 2021-09-30 DIAGNOSIS — M6281 Muscle weakness (generalized): Secondary | ICD-10-CM | POA: Diagnosis not present

## 2021-09-30 DIAGNOSIS — M1990 Unspecified osteoarthritis, unspecified site: Secondary | ICD-10-CM | POA: Diagnosis not present

## 2021-09-30 DIAGNOSIS — I129 Hypertensive chronic kidney disease with stage 1 through stage 4 chronic kidney disease, or unspecified chronic kidney disease: Secondary | ICD-10-CM | POA: Diagnosis not present

## 2021-10-01 DIAGNOSIS — R2689 Other abnormalities of gait and mobility: Secondary | ICD-10-CM | POA: Diagnosis not present

## 2021-10-01 DIAGNOSIS — M6281 Muscle weakness (generalized): Secondary | ICD-10-CM | POA: Diagnosis not present

## 2021-10-01 DIAGNOSIS — M1990 Unspecified osteoarthritis, unspecified site: Secondary | ICD-10-CM | POA: Diagnosis not present

## 2021-10-01 DIAGNOSIS — R2681 Unsteadiness on feet: Secondary | ICD-10-CM | POA: Diagnosis not present

## 2021-10-01 DIAGNOSIS — I129 Hypertensive chronic kidney disease with stage 1 through stage 4 chronic kidney disease, or unspecified chronic kidney disease: Secondary | ICD-10-CM | POA: Diagnosis not present

## 2021-10-02 DIAGNOSIS — R2681 Unsteadiness on feet: Secondary | ICD-10-CM | POA: Diagnosis not present

## 2021-10-02 DIAGNOSIS — M6281 Muscle weakness (generalized): Secondary | ICD-10-CM | POA: Diagnosis not present

## 2021-10-02 DIAGNOSIS — I129 Hypertensive chronic kidney disease with stage 1 through stage 4 chronic kidney disease, or unspecified chronic kidney disease: Secondary | ICD-10-CM | POA: Diagnosis not present

## 2021-10-02 DIAGNOSIS — R2689 Other abnormalities of gait and mobility: Secondary | ICD-10-CM | POA: Diagnosis not present

## 2021-10-02 DIAGNOSIS — M1990 Unspecified osteoarthritis, unspecified site: Secondary | ICD-10-CM | POA: Diagnosis not present

## 2021-10-03 DIAGNOSIS — I129 Hypertensive chronic kidney disease with stage 1 through stage 4 chronic kidney disease, or unspecified chronic kidney disease: Secondary | ICD-10-CM | POA: Diagnosis not present

## 2021-10-03 DIAGNOSIS — M6281 Muscle weakness (generalized): Secondary | ICD-10-CM | POA: Diagnosis not present

## 2021-10-03 DIAGNOSIS — M1990 Unspecified osteoarthritis, unspecified site: Secondary | ICD-10-CM | POA: Diagnosis not present

## 2021-10-03 DIAGNOSIS — R2689 Other abnormalities of gait and mobility: Secondary | ICD-10-CM | POA: Diagnosis not present

## 2021-10-03 DIAGNOSIS — R2681 Unsteadiness on feet: Secondary | ICD-10-CM | POA: Diagnosis not present

## 2021-10-04 DIAGNOSIS — M6281 Muscle weakness (generalized): Secondary | ICD-10-CM | POA: Diagnosis not present

## 2021-10-04 DIAGNOSIS — R2689 Other abnormalities of gait and mobility: Secondary | ICD-10-CM | POA: Diagnosis not present

## 2021-10-04 DIAGNOSIS — I129 Hypertensive chronic kidney disease with stage 1 through stage 4 chronic kidney disease, or unspecified chronic kidney disease: Secondary | ICD-10-CM | POA: Diagnosis not present

## 2021-10-04 DIAGNOSIS — M1990 Unspecified osteoarthritis, unspecified site: Secondary | ICD-10-CM | POA: Diagnosis not present

## 2021-10-04 DIAGNOSIS — R2681 Unsteadiness on feet: Secondary | ICD-10-CM | POA: Diagnosis not present

## 2021-10-05 DIAGNOSIS — I129 Hypertensive chronic kidney disease with stage 1 through stage 4 chronic kidney disease, or unspecified chronic kidney disease: Secondary | ICD-10-CM | POA: Diagnosis not present

## 2021-10-05 DIAGNOSIS — M6281 Muscle weakness (generalized): Secondary | ICD-10-CM | POA: Diagnosis not present

## 2021-10-05 DIAGNOSIS — R2689 Other abnormalities of gait and mobility: Secondary | ICD-10-CM | POA: Diagnosis not present

## 2021-10-05 DIAGNOSIS — R2681 Unsteadiness on feet: Secondary | ICD-10-CM | POA: Diagnosis not present

## 2021-10-05 DIAGNOSIS — M1990 Unspecified osteoarthritis, unspecified site: Secondary | ICD-10-CM | POA: Diagnosis not present

## 2021-10-06 DIAGNOSIS — M6281 Muscle weakness (generalized): Secondary | ICD-10-CM | POA: Diagnosis not present

## 2021-10-06 DIAGNOSIS — M1990 Unspecified osteoarthritis, unspecified site: Secondary | ICD-10-CM | POA: Diagnosis not present

## 2021-10-06 DIAGNOSIS — I129 Hypertensive chronic kidney disease with stage 1 through stage 4 chronic kidney disease, or unspecified chronic kidney disease: Secondary | ICD-10-CM | POA: Diagnosis not present

## 2021-10-06 DIAGNOSIS — R2689 Other abnormalities of gait and mobility: Secondary | ICD-10-CM | POA: Diagnosis not present

## 2021-10-06 DIAGNOSIS — R2681 Unsteadiness on feet: Secondary | ICD-10-CM | POA: Diagnosis not present

## 2021-10-07 DIAGNOSIS — M1990 Unspecified osteoarthritis, unspecified site: Secondary | ICD-10-CM | POA: Diagnosis not present

## 2021-10-07 DIAGNOSIS — M6281 Muscle weakness (generalized): Secondary | ICD-10-CM | POA: Diagnosis not present

## 2021-10-07 DIAGNOSIS — R2681 Unsteadiness on feet: Secondary | ICD-10-CM | POA: Diagnosis not present

## 2021-10-07 DIAGNOSIS — I129 Hypertensive chronic kidney disease with stage 1 through stage 4 chronic kidney disease, or unspecified chronic kidney disease: Secondary | ICD-10-CM | POA: Diagnosis not present

## 2021-10-07 DIAGNOSIS — R2689 Other abnormalities of gait and mobility: Secondary | ICD-10-CM | POA: Diagnosis not present

## 2021-10-08 DIAGNOSIS — I129 Hypertensive chronic kidney disease with stage 1 through stage 4 chronic kidney disease, or unspecified chronic kidney disease: Secondary | ICD-10-CM | POA: Diagnosis not present

## 2021-10-08 DIAGNOSIS — M6281 Muscle weakness (generalized): Secondary | ICD-10-CM | POA: Diagnosis not present

## 2021-10-08 DIAGNOSIS — M1990 Unspecified osteoarthritis, unspecified site: Secondary | ICD-10-CM | POA: Diagnosis not present

## 2021-10-08 DIAGNOSIS — R2689 Other abnormalities of gait and mobility: Secondary | ICD-10-CM | POA: Diagnosis not present

## 2021-10-08 DIAGNOSIS — R2681 Unsteadiness on feet: Secondary | ICD-10-CM | POA: Diagnosis not present

## 2021-10-09 DIAGNOSIS — R2681 Unsteadiness on feet: Secondary | ICD-10-CM | POA: Diagnosis not present

## 2021-10-09 DIAGNOSIS — M6281 Muscle weakness (generalized): Secondary | ICD-10-CM | POA: Diagnosis not present

## 2021-10-09 DIAGNOSIS — M1990 Unspecified osteoarthritis, unspecified site: Secondary | ICD-10-CM | POA: Diagnosis not present

## 2021-10-09 DIAGNOSIS — I129 Hypertensive chronic kidney disease with stage 1 through stage 4 chronic kidney disease, or unspecified chronic kidney disease: Secondary | ICD-10-CM | POA: Diagnosis not present

## 2021-10-09 DIAGNOSIS — R2689 Other abnormalities of gait and mobility: Secondary | ICD-10-CM | POA: Diagnosis not present

## 2021-10-10 DIAGNOSIS — I129 Hypertensive chronic kidney disease with stage 1 through stage 4 chronic kidney disease, or unspecified chronic kidney disease: Secondary | ICD-10-CM | POA: Diagnosis not present

## 2021-10-10 DIAGNOSIS — R2681 Unsteadiness on feet: Secondary | ICD-10-CM | POA: Diagnosis not present

## 2021-10-10 DIAGNOSIS — M6281 Muscle weakness (generalized): Secondary | ICD-10-CM | POA: Diagnosis not present

## 2021-10-10 DIAGNOSIS — M1990 Unspecified osteoarthritis, unspecified site: Secondary | ICD-10-CM | POA: Diagnosis not present

## 2021-10-10 DIAGNOSIS — R2689 Other abnormalities of gait and mobility: Secondary | ICD-10-CM | POA: Diagnosis not present

## 2021-10-11 DIAGNOSIS — M1990 Unspecified osteoarthritis, unspecified site: Secondary | ICD-10-CM | POA: Diagnosis not present

## 2021-10-11 DIAGNOSIS — R2689 Other abnormalities of gait and mobility: Secondary | ICD-10-CM | POA: Diagnosis not present

## 2021-10-11 DIAGNOSIS — R2681 Unsteadiness on feet: Secondary | ICD-10-CM | POA: Diagnosis not present

## 2021-10-11 DIAGNOSIS — I129 Hypertensive chronic kidney disease with stage 1 through stage 4 chronic kidney disease, or unspecified chronic kidney disease: Secondary | ICD-10-CM | POA: Diagnosis not present

## 2021-10-11 DIAGNOSIS — M6281 Muscle weakness (generalized): Secondary | ICD-10-CM | POA: Diagnosis not present

## 2021-10-13 DIAGNOSIS — M1990 Unspecified osteoarthritis, unspecified site: Secondary | ICD-10-CM | POA: Diagnosis not present

## 2021-10-13 DIAGNOSIS — I129 Hypertensive chronic kidney disease with stage 1 through stage 4 chronic kidney disease, or unspecified chronic kidney disease: Secondary | ICD-10-CM | POA: Diagnosis not present

## 2021-10-13 DIAGNOSIS — R2689 Other abnormalities of gait and mobility: Secondary | ICD-10-CM | POA: Diagnosis not present

## 2021-10-13 DIAGNOSIS — R2681 Unsteadiness on feet: Secondary | ICD-10-CM | POA: Diagnosis not present

## 2021-10-13 DIAGNOSIS — M6281 Muscle weakness (generalized): Secondary | ICD-10-CM | POA: Diagnosis not present

## 2021-10-14 DIAGNOSIS — M6281 Muscle weakness (generalized): Secondary | ICD-10-CM | POA: Diagnosis not present

## 2021-10-14 DIAGNOSIS — M1990 Unspecified osteoarthritis, unspecified site: Secondary | ICD-10-CM | POA: Diagnosis not present

## 2021-10-14 DIAGNOSIS — R2689 Other abnormalities of gait and mobility: Secondary | ICD-10-CM | POA: Diagnosis not present

## 2021-10-14 DIAGNOSIS — R2681 Unsteadiness on feet: Secondary | ICD-10-CM | POA: Diagnosis not present

## 2021-10-14 DIAGNOSIS — I129 Hypertensive chronic kidney disease with stage 1 through stage 4 chronic kidney disease, or unspecified chronic kidney disease: Secondary | ICD-10-CM | POA: Diagnosis not present

## 2021-10-15 DIAGNOSIS — M6281 Muscle weakness (generalized): Secondary | ICD-10-CM | POA: Diagnosis not present

## 2021-10-15 DIAGNOSIS — R2681 Unsteadiness on feet: Secondary | ICD-10-CM | POA: Diagnosis not present

## 2021-10-15 DIAGNOSIS — R2689 Other abnormalities of gait and mobility: Secondary | ICD-10-CM | POA: Diagnosis not present

## 2021-10-15 DIAGNOSIS — I129 Hypertensive chronic kidney disease with stage 1 through stage 4 chronic kidney disease, or unspecified chronic kidney disease: Secondary | ICD-10-CM | POA: Diagnosis not present

## 2021-10-15 DIAGNOSIS — M1990 Unspecified osteoarthritis, unspecified site: Secondary | ICD-10-CM | POA: Diagnosis not present

## 2021-10-16 DIAGNOSIS — I129 Hypertensive chronic kidney disease with stage 1 through stage 4 chronic kidney disease, or unspecified chronic kidney disease: Secondary | ICD-10-CM | POA: Diagnosis not present

## 2021-10-16 DIAGNOSIS — R2681 Unsteadiness on feet: Secondary | ICD-10-CM | POA: Diagnosis not present

## 2021-10-16 DIAGNOSIS — R2689 Other abnormalities of gait and mobility: Secondary | ICD-10-CM | POA: Diagnosis not present

## 2021-10-16 DIAGNOSIS — M1990 Unspecified osteoarthritis, unspecified site: Secondary | ICD-10-CM | POA: Diagnosis not present

## 2021-10-16 DIAGNOSIS — M6281 Muscle weakness (generalized): Secondary | ICD-10-CM | POA: Diagnosis not present

## 2021-10-17 DIAGNOSIS — R2681 Unsteadiness on feet: Secondary | ICD-10-CM | POA: Diagnosis not present

## 2021-10-17 DIAGNOSIS — M6281 Muscle weakness (generalized): Secondary | ICD-10-CM | POA: Diagnosis not present

## 2021-10-17 DIAGNOSIS — M1990 Unspecified osteoarthritis, unspecified site: Secondary | ICD-10-CM | POA: Diagnosis not present

## 2021-10-17 DIAGNOSIS — R2689 Other abnormalities of gait and mobility: Secondary | ICD-10-CM | POA: Diagnosis not present

## 2021-10-17 DIAGNOSIS — I129 Hypertensive chronic kidney disease with stage 1 through stage 4 chronic kidney disease, or unspecified chronic kidney disease: Secondary | ICD-10-CM | POA: Diagnosis not present

## 2021-10-18 DIAGNOSIS — M1990 Unspecified osteoarthritis, unspecified site: Secondary | ICD-10-CM | POA: Diagnosis not present

## 2021-10-18 DIAGNOSIS — M6281 Muscle weakness (generalized): Secondary | ICD-10-CM | POA: Diagnosis not present

## 2021-10-18 DIAGNOSIS — I129 Hypertensive chronic kidney disease with stage 1 through stage 4 chronic kidney disease, or unspecified chronic kidney disease: Secondary | ICD-10-CM | POA: Diagnosis not present

## 2021-10-18 DIAGNOSIS — R2681 Unsteadiness on feet: Secondary | ICD-10-CM | POA: Diagnosis not present

## 2021-10-18 DIAGNOSIS — R2689 Other abnormalities of gait and mobility: Secondary | ICD-10-CM | POA: Diagnosis not present

## 2021-10-19 DIAGNOSIS — M1990 Unspecified osteoarthritis, unspecified site: Secondary | ICD-10-CM | POA: Diagnosis not present

## 2021-10-19 DIAGNOSIS — R2689 Other abnormalities of gait and mobility: Secondary | ICD-10-CM | POA: Diagnosis not present

## 2021-10-19 DIAGNOSIS — I129 Hypertensive chronic kidney disease with stage 1 through stage 4 chronic kidney disease, or unspecified chronic kidney disease: Secondary | ICD-10-CM | POA: Diagnosis not present

## 2021-10-19 DIAGNOSIS — M6281 Muscle weakness (generalized): Secondary | ICD-10-CM | POA: Diagnosis not present

## 2021-10-19 DIAGNOSIS — R2681 Unsteadiness on feet: Secondary | ICD-10-CM | POA: Diagnosis not present

## 2021-10-20 DIAGNOSIS — M1990 Unspecified osteoarthritis, unspecified site: Secondary | ICD-10-CM | POA: Diagnosis not present

## 2021-10-20 DIAGNOSIS — I129 Hypertensive chronic kidney disease with stage 1 through stage 4 chronic kidney disease, or unspecified chronic kidney disease: Secondary | ICD-10-CM | POA: Diagnosis not present

## 2021-10-20 DIAGNOSIS — M6281 Muscle weakness (generalized): Secondary | ICD-10-CM | POA: Diagnosis not present

## 2021-10-20 DIAGNOSIS — R2681 Unsteadiness on feet: Secondary | ICD-10-CM | POA: Diagnosis not present

## 2021-10-20 DIAGNOSIS — R2689 Other abnormalities of gait and mobility: Secondary | ICD-10-CM | POA: Diagnosis not present

## 2021-10-21 DIAGNOSIS — R2689 Other abnormalities of gait and mobility: Secondary | ICD-10-CM | POA: Diagnosis not present

## 2021-10-21 DIAGNOSIS — M6281 Muscle weakness (generalized): Secondary | ICD-10-CM | POA: Diagnosis not present

## 2021-10-21 DIAGNOSIS — M1990 Unspecified osteoarthritis, unspecified site: Secondary | ICD-10-CM | POA: Diagnosis not present

## 2021-10-21 DIAGNOSIS — R2681 Unsteadiness on feet: Secondary | ICD-10-CM | POA: Diagnosis not present

## 2021-10-21 DIAGNOSIS — I129 Hypertensive chronic kidney disease with stage 1 through stage 4 chronic kidney disease, or unspecified chronic kidney disease: Secondary | ICD-10-CM | POA: Diagnosis not present

## 2021-10-22 DIAGNOSIS — I129 Hypertensive chronic kidney disease with stage 1 through stage 4 chronic kidney disease, or unspecified chronic kidney disease: Secondary | ICD-10-CM | POA: Diagnosis not present

## 2021-10-22 DIAGNOSIS — R2681 Unsteadiness on feet: Secondary | ICD-10-CM | POA: Diagnosis not present

## 2021-10-22 DIAGNOSIS — R2689 Other abnormalities of gait and mobility: Secondary | ICD-10-CM | POA: Diagnosis not present

## 2021-10-22 DIAGNOSIS — M6281 Muscle weakness (generalized): Secondary | ICD-10-CM | POA: Diagnosis not present

## 2021-10-22 DIAGNOSIS — M1990 Unspecified osteoarthritis, unspecified site: Secondary | ICD-10-CM | POA: Diagnosis not present

## 2021-10-23 DIAGNOSIS — I129 Hypertensive chronic kidney disease with stage 1 through stage 4 chronic kidney disease, or unspecified chronic kidney disease: Secondary | ICD-10-CM | POA: Diagnosis not present

## 2021-10-23 DIAGNOSIS — R2681 Unsteadiness on feet: Secondary | ICD-10-CM | POA: Diagnosis not present

## 2021-10-23 DIAGNOSIS — M1990 Unspecified osteoarthritis, unspecified site: Secondary | ICD-10-CM | POA: Diagnosis not present

## 2021-10-23 DIAGNOSIS — M6281 Muscle weakness (generalized): Secondary | ICD-10-CM | POA: Diagnosis not present

## 2021-10-23 DIAGNOSIS — R2689 Other abnormalities of gait and mobility: Secondary | ICD-10-CM | POA: Diagnosis not present

## 2021-10-24 DIAGNOSIS — R2681 Unsteadiness on feet: Secondary | ICD-10-CM | POA: Diagnosis not present

## 2021-10-24 DIAGNOSIS — I129 Hypertensive chronic kidney disease with stage 1 through stage 4 chronic kidney disease, or unspecified chronic kidney disease: Secondary | ICD-10-CM | POA: Diagnosis not present

## 2021-10-24 DIAGNOSIS — M1990 Unspecified osteoarthritis, unspecified site: Secondary | ICD-10-CM | POA: Diagnosis not present

## 2021-10-24 DIAGNOSIS — M6281 Muscle weakness (generalized): Secondary | ICD-10-CM | POA: Diagnosis not present

## 2021-10-24 DIAGNOSIS — R2689 Other abnormalities of gait and mobility: Secondary | ICD-10-CM | POA: Diagnosis not present

## 2021-11-11 DIAGNOSIS — E039 Hypothyroidism, unspecified: Secondary | ICD-10-CM | POA: Diagnosis not present

## 2021-11-11 DIAGNOSIS — F339 Major depressive disorder, recurrent, unspecified: Secondary | ICD-10-CM | POA: Diagnosis not present

## 2021-11-11 DIAGNOSIS — I2510 Atherosclerotic heart disease of native coronary artery without angina pectoris: Secondary | ICD-10-CM | POA: Diagnosis not present

## 2021-11-11 DIAGNOSIS — I1310 Hypertensive heart and chronic kidney disease without heart failure, with stage 1 through stage 4 chronic kidney disease, or unspecified chronic kidney disease: Secondary | ICD-10-CM | POA: Diagnosis not present

## 2022-01-16 DIAGNOSIS — D509 Iron deficiency anemia, unspecified: Secondary | ICD-10-CM | POA: Diagnosis not present

## 2022-01-16 DIAGNOSIS — I1310 Hypertensive heart and chronic kidney disease without heart failure, with stage 1 through stage 4 chronic kidney disease, or unspecified chronic kidney disease: Secondary | ICD-10-CM | POA: Diagnosis not present

## 2022-01-16 DIAGNOSIS — F32A Depression, unspecified: Secondary | ICD-10-CM | POA: Diagnosis not present

## 2022-01-16 DIAGNOSIS — E039 Hypothyroidism, unspecified: Secondary | ICD-10-CM | POA: Diagnosis not present

## 2022-02-16 DIAGNOSIS — E43 Unspecified severe protein-calorie malnutrition: Secondary | ICD-10-CM | POA: Diagnosis not present

## 2022-02-16 DIAGNOSIS — H5330 Unspecified disorder of binocular vision: Secondary | ICD-10-CM | POA: Diagnosis not present

## 2022-02-18 DIAGNOSIS — I129 Hypertensive chronic kidney disease with stage 1 through stage 4 chronic kidney disease, or unspecified chronic kidney disease: Secondary | ICD-10-CM | POA: Diagnosis not present

## 2022-02-18 DIAGNOSIS — E785 Hyperlipidemia, unspecified: Secondary | ICD-10-CM | POA: Diagnosis not present

## 2022-02-18 DIAGNOSIS — M6281 Muscle weakness (generalized): Secondary | ICD-10-CM | POA: Diagnosis not present

## 2022-02-18 DIAGNOSIS — N1832 Chronic kidney disease, stage 3b: Secondary | ICD-10-CM | POA: Diagnosis not present

## 2022-02-18 DIAGNOSIS — R2681 Unsteadiness on feet: Secondary | ICD-10-CM | POA: Diagnosis not present

## 2022-02-18 DIAGNOSIS — R2689 Other abnormalities of gait and mobility: Secondary | ICD-10-CM | POA: Diagnosis not present

## 2022-02-19 DIAGNOSIS — N1832 Chronic kidney disease, stage 3b: Secondary | ICD-10-CM | POA: Diagnosis not present

## 2022-02-19 DIAGNOSIS — R2689 Other abnormalities of gait and mobility: Secondary | ICD-10-CM | POA: Diagnosis not present

## 2022-02-19 DIAGNOSIS — E785 Hyperlipidemia, unspecified: Secondary | ICD-10-CM | POA: Diagnosis not present

## 2022-02-19 DIAGNOSIS — M6281 Muscle weakness (generalized): Secondary | ICD-10-CM | POA: Diagnosis not present

## 2022-02-19 DIAGNOSIS — I129 Hypertensive chronic kidney disease with stage 1 through stage 4 chronic kidney disease, or unspecified chronic kidney disease: Secondary | ICD-10-CM | POA: Diagnosis not present

## 2022-02-19 DIAGNOSIS — R2681 Unsteadiness on feet: Secondary | ICD-10-CM | POA: Diagnosis not present

## 2022-02-20 DIAGNOSIS — E785 Hyperlipidemia, unspecified: Secondary | ICD-10-CM | POA: Diagnosis not present

## 2022-02-20 DIAGNOSIS — R2681 Unsteadiness on feet: Secondary | ICD-10-CM | POA: Diagnosis not present

## 2022-02-20 DIAGNOSIS — M6281 Muscle weakness (generalized): Secondary | ICD-10-CM | POA: Diagnosis not present

## 2022-02-20 DIAGNOSIS — N1832 Chronic kidney disease, stage 3b: Secondary | ICD-10-CM | POA: Diagnosis not present

## 2022-02-20 DIAGNOSIS — R2689 Other abnormalities of gait and mobility: Secondary | ICD-10-CM | POA: Diagnosis not present

## 2022-02-20 DIAGNOSIS — I129 Hypertensive chronic kidney disease with stage 1 through stage 4 chronic kidney disease, or unspecified chronic kidney disease: Secondary | ICD-10-CM | POA: Diagnosis not present

## 2022-02-21 DIAGNOSIS — R2681 Unsteadiness on feet: Secondary | ICD-10-CM | POA: Diagnosis not present

## 2022-02-21 DIAGNOSIS — E785 Hyperlipidemia, unspecified: Secondary | ICD-10-CM | POA: Diagnosis not present

## 2022-02-21 DIAGNOSIS — I129 Hypertensive chronic kidney disease with stage 1 through stage 4 chronic kidney disease, or unspecified chronic kidney disease: Secondary | ICD-10-CM | POA: Diagnosis not present

## 2022-02-21 DIAGNOSIS — R2689 Other abnormalities of gait and mobility: Secondary | ICD-10-CM | POA: Diagnosis not present

## 2022-02-21 DIAGNOSIS — M6281 Muscle weakness (generalized): Secondary | ICD-10-CM | POA: Diagnosis not present

## 2022-02-21 DIAGNOSIS — N1832 Chronic kidney disease, stage 3b: Secondary | ICD-10-CM | POA: Diagnosis not present

## 2022-02-22 DIAGNOSIS — R2689 Other abnormalities of gait and mobility: Secondary | ICD-10-CM | POA: Diagnosis not present

## 2022-02-22 DIAGNOSIS — M6281 Muscle weakness (generalized): Secondary | ICD-10-CM | POA: Diagnosis not present

## 2022-02-22 DIAGNOSIS — E785 Hyperlipidemia, unspecified: Secondary | ICD-10-CM | POA: Diagnosis not present

## 2022-02-22 DIAGNOSIS — N1832 Chronic kidney disease, stage 3b: Secondary | ICD-10-CM | POA: Diagnosis not present

## 2022-02-22 DIAGNOSIS — I129 Hypertensive chronic kidney disease with stage 1 through stage 4 chronic kidney disease, or unspecified chronic kidney disease: Secondary | ICD-10-CM | POA: Diagnosis not present

## 2022-02-22 DIAGNOSIS — R2681 Unsteadiness on feet: Secondary | ICD-10-CM | POA: Diagnosis not present

## 2022-02-23 DIAGNOSIS — I129 Hypertensive chronic kidney disease with stage 1 through stage 4 chronic kidney disease, or unspecified chronic kidney disease: Secondary | ICD-10-CM | POA: Diagnosis not present

## 2022-02-23 DIAGNOSIS — N1832 Chronic kidney disease, stage 3b: Secondary | ICD-10-CM | POA: Diagnosis not present

## 2022-02-23 DIAGNOSIS — E785 Hyperlipidemia, unspecified: Secondary | ICD-10-CM | POA: Diagnosis not present

## 2022-02-23 DIAGNOSIS — M6281 Muscle weakness (generalized): Secondary | ICD-10-CM | POA: Diagnosis not present

## 2022-02-23 DIAGNOSIS — R2689 Other abnormalities of gait and mobility: Secondary | ICD-10-CM | POA: Diagnosis not present

## 2022-02-23 DIAGNOSIS — R2681 Unsteadiness on feet: Secondary | ICD-10-CM | POA: Diagnosis not present

## 2022-02-24 DIAGNOSIS — I129 Hypertensive chronic kidney disease with stage 1 through stage 4 chronic kidney disease, or unspecified chronic kidney disease: Secondary | ICD-10-CM | POA: Diagnosis not present

## 2022-02-24 DIAGNOSIS — E785 Hyperlipidemia, unspecified: Secondary | ICD-10-CM | POA: Diagnosis not present

## 2022-02-24 DIAGNOSIS — R2681 Unsteadiness on feet: Secondary | ICD-10-CM | POA: Diagnosis not present

## 2022-02-24 DIAGNOSIS — N1832 Chronic kidney disease, stage 3b: Secondary | ICD-10-CM | POA: Diagnosis not present

## 2022-02-24 DIAGNOSIS — R2689 Other abnormalities of gait and mobility: Secondary | ICD-10-CM | POA: Diagnosis not present

## 2022-02-24 DIAGNOSIS — M6281 Muscle weakness (generalized): Secondary | ICD-10-CM | POA: Diagnosis not present

## 2022-02-25 DIAGNOSIS — E785 Hyperlipidemia, unspecified: Secondary | ICD-10-CM | POA: Diagnosis not present

## 2022-02-25 DIAGNOSIS — R2689 Other abnormalities of gait and mobility: Secondary | ICD-10-CM | POA: Diagnosis not present

## 2022-02-25 DIAGNOSIS — M6281 Muscle weakness (generalized): Secondary | ICD-10-CM | POA: Diagnosis not present

## 2022-02-25 DIAGNOSIS — R2681 Unsteadiness on feet: Secondary | ICD-10-CM | POA: Diagnosis not present

## 2022-02-25 DIAGNOSIS — N1832 Chronic kidney disease, stage 3b: Secondary | ICD-10-CM | POA: Diagnosis not present

## 2022-02-25 DIAGNOSIS — I129 Hypertensive chronic kidney disease with stage 1 through stage 4 chronic kidney disease, or unspecified chronic kidney disease: Secondary | ICD-10-CM | POA: Diagnosis not present

## 2022-02-26 DIAGNOSIS — I129 Hypertensive chronic kidney disease with stage 1 through stage 4 chronic kidney disease, or unspecified chronic kidney disease: Secondary | ICD-10-CM | POA: Diagnosis not present

## 2022-02-26 DIAGNOSIS — R2689 Other abnormalities of gait and mobility: Secondary | ICD-10-CM | POA: Diagnosis not present

## 2022-02-26 DIAGNOSIS — E785 Hyperlipidemia, unspecified: Secondary | ICD-10-CM | POA: Diagnosis not present

## 2022-02-26 DIAGNOSIS — M6281 Muscle weakness (generalized): Secondary | ICD-10-CM | POA: Diagnosis not present

## 2022-02-26 DIAGNOSIS — N1832 Chronic kidney disease, stage 3b: Secondary | ICD-10-CM | POA: Diagnosis not present

## 2022-02-26 DIAGNOSIS — R2681 Unsteadiness on feet: Secondary | ICD-10-CM | POA: Diagnosis not present

## 2022-02-27 DIAGNOSIS — R2689 Other abnormalities of gait and mobility: Secondary | ICD-10-CM | POA: Diagnosis not present

## 2022-02-27 DIAGNOSIS — N1832 Chronic kidney disease, stage 3b: Secondary | ICD-10-CM | POA: Diagnosis not present

## 2022-02-27 DIAGNOSIS — E785 Hyperlipidemia, unspecified: Secondary | ICD-10-CM | POA: Diagnosis not present

## 2022-02-27 DIAGNOSIS — R2681 Unsteadiness on feet: Secondary | ICD-10-CM | POA: Diagnosis not present

## 2022-02-27 DIAGNOSIS — M6281 Muscle weakness (generalized): Secondary | ICD-10-CM | POA: Diagnosis not present

## 2022-02-27 DIAGNOSIS — I129 Hypertensive chronic kidney disease with stage 1 through stage 4 chronic kidney disease, or unspecified chronic kidney disease: Secondary | ICD-10-CM | POA: Diagnosis not present

## 2022-02-28 DIAGNOSIS — R2689 Other abnormalities of gait and mobility: Secondary | ICD-10-CM | POA: Diagnosis not present

## 2022-02-28 DIAGNOSIS — I129 Hypertensive chronic kidney disease with stage 1 through stage 4 chronic kidney disease, or unspecified chronic kidney disease: Secondary | ICD-10-CM | POA: Diagnosis not present

## 2022-02-28 DIAGNOSIS — R2681 Unsteadiness on feet: Secondary | ICD-10-CM | POA: Diagnosis not present

## 2022-02-28 DIAGNOSIS — N1832 Chronic kidney disease, stage 3b: Secondary | ICD-10-CM | POA: Diagnosis not present

## 2022-02-28 DIAGNOSIS — E785 Hyperlipidemia, unspecified: Secondary | ICD-10-CM | POA: Diagnosis not present

## 2022-02-28 DIAGNOSIS — M6281 Muscle weakness (generalized): Secondary | ICD-10-CM | POA: Diagnosis not present

## 2022-03-01 DIAGNOSIS — I129 Hypertensive chronic kidney disease with stage 1 through stage 4 chronic kidney disease, or unspecified chronic kidney disease: Secondary | ICD-10-CM | POA: Diagnosis not present

## 2022-03-01 DIAGNOSIS — R2681 Unsteadiness on feet: Secondary | ICD-10-CM | POA: Diagnosis not present

## 2022-03-01 DIAGNOSIS — N1832 Chronic kidney disease, stage 3b: Secondary | ICD-10-CM | POA: Diagnosis not present

## 2022-03-01 DIAGNOSIS — M6281 Muscle weakness (generalized): Secondary | ICD-10-CM | POA: Diagnosis not present

## 2022-03-01 DIAGNOSIS — R2689 Other abnormalities of gait and mobility: Secondary | ICD-10-CM | POA: Diagnosis not present

## 2022-03-01 DIAGNOSIS — E785 Hyperlipidemia, unspecified: Secondary | ICD-10-CM | POA: Diagnosis not present

## 2022-03-02 DIAGNOSIS — R2681 Unsteadiness on feet: Secondary | ICD-10-CM | POA: Diagnosis not present

## 2022-03-02 DIAGNOSIS — R2689 Other abnormalities of gait and mobility: Secondary | ICD-10-CM | POA: Diagnosis not present

## 2022-03-02 DIAGNOSIS — E785 Hyperlipidemia, unspecified: Secondary | ICD-10-CM | POA: Diagnosis not present

## 2022-03-02 DIAGNOSIS — M6281 Muscle weakness (generalized): Secondary | ICD-10-CM | POA: Diagnosis not present

## 2022-03-02 DIAGNOSIS — I129 Hypertensive chronic kidney disease with stage 1 through stage 4 chronic kidney disease, or unspecified chronic kidney disease: Secondary | ICD-10-CM | POA: Diagnosis not present

## 2022-03-02 DIAGNOSIS — N1832 Chronic kidney disease, stage 3b: Secondary | ICD-10-CM | POA: Diagnosis not present

## 2022-03-04 DIAGNOSIS — E785 Hyperlipidemia, unspecified: Secondary | ICD-10-CM | POA: Diagnosis not present

## 2022-03-04 DIAGNOSIS — R2681 Unsteadiness on feet: Secondary | ICD-10-CM | POA: Diagnosis not present

## 2022-03-04 DIAGNOSIS — I129 Hypertensive chronic kidney disease with stage 1 through stage 4 chronic kidney disease, or unspecified chronic kidney disease: Secondary | ICD-10-CM | POA: Diagnosis not present

## 2022-03-04 DIAGNOSIS — R2689 Other abnormalities of gait and mobility: Secondary | ICD-10-CM | POA: Diagnosis not present

## 2022-03-04 DIAGNOSIS — N1832 Chronic kidney disease, stage 3b: Secondary | ICD-10-CM | POA: Diagnosis not present

## 2022-03-04 DIAGNOSIS — M6281 Muscle weakness (generalized): Secondary | ICD-10-CM | POA: Diagnosis not present

## 2022-03-05 DIAGNOSIS — R2681 Unsteadiness on feet: Secondary | ICD-10-CM | POA: Diagnosis not present

## 2022-03-05 DIAGNOSIS — M6281 Muscle weakness (generalized): Secondary | ICD-10-CM | POA: Diagnosis not present

## 2022-03-05 DIAGNOSIS — E785 Hyperlipidemia, unspecified: Secondary | ICD-10-CM | POA: Diagnosis not present

## 2022-03-05 DIAGNOSIS — N1832 Chronic kidney disease, stage 3b: Secondary | ICD-10-CM | POA: Diagnosis not present

## 2022-03-05 DIAGNOSIS — R2689 Other abnormalities of gait and mobility: Secondary | ICD-10-CM | POA: Diagnosis not present

## 2022-03-05 DIAGNOSIS — I129 Hypertensive chronic kidney disease with stage 1 through stage 4 chronic kidney disease, or unspecified chronic kidney disease: Secondary | ICD-10-CM | POA: Diagnosis not present

## 2022-03-06 DIAGNOSIS — N1832 Chronic kidney disease, stage 3b: Secondary | ICD-10-CM | POA: Diagnosis not present

## 2022-03-06 DIAGNOSIS — M6281 Muscle weakness (generalized): Secondary | ICD-10-CM | POA: Diagnosis not present

## 2022-03-06 DIAGNOSIS — R2681 Unsteadiness on feet: Secondary | ICD-10-CM | POA: Diagnosis not present

## 2022-03-06 DIAGNOSIS — R2689 Other abnormalities of gait and mobility: Secondary | ICD-10-CM | POA: Diagnosis not present

## 2022-03-06 DIAGNOSIS — I129 Hypertensive chronic kidney disease with stage 1 through stage 4 chronic kidney disease, or unspecified chronic kidney disease: Secondary | ICD-10-CM | POA: Diagnosis not present

## 2022-03-06 DIAGNOSIS — E785 Hyperlipidemia, unspecified: Secondary | ICD-10-CM | POA: Diagnosis not present

## 2022-03-07 DIAGNOSIS — I129 Hypertensive chronic kidney disease with stage 1 through stage 4 chronic kidney disease, or unspecified chronic kidney disease: Secondary | ICD-10-CM | POA: Diagnosis not present

## 2022-03-07 DIAGNOSIS — N1832 Chronic kidney disease, stage 3b: Secondary | ICD-10-CM | POA: Diagnosis not present

## 2022-03-07 DIAGNOSIS — E785 Hyperlipidemia, unspecified: Secondary | ICD-10-CM | POA: Diagnosis not present

## 2022-03-07 DIAGNOSIS — R2681 Unsteadiness on feet: Secondary | ICD-10-CM | POA: Diagnosis not present

## 2022-03-07 DIAGNOSIS — R2689 Other abnormalities of gait and mobility: Secondary | ICD-10-CM | POA: Diagnosis not present

## 2022-03-07 DIAGNOSIS — M6281 Muscle weakness (generalized): Secondary | ICD-10-CM | POA: Diagnosis not present

## 2022-03-08 DIAGNOSIS — I129 Hypertensive chronic kidney disease with stage 1 through stage 4 chronic kidney disease, or unspecified chronic kidney disease: Secondary | ICD-10-CM | POA: Diagnosis not present

## 2022-03-08 DIAGNOSIS — R2689 Other abnormalities of gait and mobility: Secondary | ICD-10-CM | POA: Diagnosis not present

## 2022-03-08 DIAGNOSIS — M6281 Muscle weakness (generalized): Secondary | ICD-10-CM | POA: Diagnosis not present

## 2022-03-08 DIAGNOSIS — R2681 Unsteadiness on feet: Secondary | ICD-10-CM | POA: Diagnosis not present

## 2022-03-08 DIAGNOSIS — E785 Hyperlipidemia, unspecified: Secondary | ICD-10-CM | POA: Diagnosis not present

## 2022-03-08 DIAGNOSIS — N1832 Chronic kidney disease, stage 3b: Secondary | ICD-10-CM | POA: Diagnosis not present

## 2022-03-09 DIAGNOSIS — M6281 Muscle weakness (generalized): Secondary | ICD-10-CM | POA: Diagnosis not present

## 2022-03-09 DIAGNOSIS — N1832 Chronic kidney disease, stage 3b: Secondary | ICD-10-CM | POA: Diagnosis not present

## 2022-03-09 DIAGNOSIS — E785 Hyperlipidemia, unspecified: Secondary | ICD-10-CM | POA: Diagnosis not present

## 2022-03-09 DIAGNOSIS — R2689 Other abnormalities of gait and mobility: Secondary | ICD-10-CM | POA: Diagnosis not present

## 2022-03-09 DIAGNOSIS — I129 Hypertensive chronic kidney disease with stage 1 through stage 4 chronic kidney disease, or unspecified chronic kidney disease: Secondary | ICD-10-CM | POA: Diagnosis not present

## 2022-03-09 DIAGNOSIS — R2681 Unsteadiness on feet: Secondary | ICD-10-CM | POA: Diagnosis not present

## 2022-03-11 DIAGNOSIS — E785 Hyperlipidemia, unspecified: Secondary | ICD-10-CM | POA: Diagnosis not present

## 2022-03-11 DIAGNOSIS — M6281 Muscle weakness (generalized): Secondary | ICD-10-CM | POA: Diagnosis not present

## 2022-03-11 DIAGNOSIS — N1832 Chronic kidney disease, stage 3b: Secondary | ICD-10-CM | POA: Diagnosis not present

## 2022-03-11 DIAGNOSIS — I129 Hypertensive chronic kidney disease with stage 1 through stage 4 chronic kidney disease, or unspecified chronic kidney disease: Secondary | ICD-10-CM | POA: Diagnosis not present

## 2022-03-11 DIAGNOSIS — R2681 Unsteadiness on feet: Secondary | ICD-10-CM | POA: Diagnosis not present

## 2022-03-11 DIAGNOSIS — R2689 Other abnormalities of gait and mobility: Secondary | ICD-10-CM | POA: Diagnosis not present

## 2022-03-12 DIAGNOSIS — E785 Hyperlipidemia, unspecified: Secondary | ICD-10-CM | POA: Diagnosis not present

## 2022-03-12 DIAGNOSIS — R2681 Unsteadiness on feet: Secondary | ICD-10-CM | POA: Diagnosis not present

## 2022-03-12 DIAGNOSIS — N1832 Chronic kidney disease, stage 3b: Secondary | ICD-10-CM | POA: Diagnosis not present

## 2022-03-12 DIAGNOSIS — R2689 Other abnormalities of gait and mobility: Secondary | ICD-10-CM | POA: Diagnosis not present

## 2022-03-12 DIAGNOSIS — M6281 Muscle weakness (generalized): Secondary | ICD-10-CM | POA: Diagnosis not present

## 2022-03-12 DIAGNOSIS — I129 Hypertensive chronic kidney disease with stage 1 through stage 4 chronic kidney disease, or unspecified chronic kidney disease: Secondary | ICD-10-CM | POA: Diagnosis not present

## 2022-03-19 DIAGNOSIS — F339 Major depressive disorder, recurrent, unspecified: Secondary | ICD-10-CM | POA: Diagnosis not present

## 2022-03-19 DIAGNOSIS — I2510 Atherosclerotic heart disease of native coronary artery without angina pectoris: Secondary | ICD-10-CM | POA: Diagnosis not present

## 2022-03-19 DIAGNOSIS — E039 Hypothyroidism, unspecified: Secondary | ICD-10-CM | POA: Diagnosis not present

## 2022-03-19 DIAGNOSIS — I1310 Hypertensive heart and chronic kidney disease without heart failure, with stage 1 through stage 4 chronic kidney disease, or unspecified chronic kidney disease: Secondary | ICD-10-CM | POA: Diagnosis not present

## 2022-04-06 DIAGNOSIS — R1311 Dysphagia, oral phase: Secondary | ICD-10-CM | POA: Diagnosis not present

## 2022-04-09 DIAGNOSIS — R1311 Dysphagia, oral phase: Secondary | ICD-10-CM | POA: Diagnosis not present

## 2022-04-11 DIAGNOSIS — R1311 Dysphagia, oral phase: Secondary | ICD-10-CM | POA: Diagnosis not present

## 2022-04-13 DIAGNOSIS — R1311 Dysphagia, oral phase: Secondary | ICD-10-CM | POA: Diagnosis not present

## 2022-04-14 DIAGNOSIS — R1311 Dysphagia, oral phase: Secondary | ICD-10-CM | POA: Diagnosis not present

## 2022-04-15 DIAGNOSIS — R1311 Dysphagia, oral phase: Secondary | ICD-10-CM | POA: Diagnosis not present

## 2022-05-15 DIAGNOSIS — R519 Headache, unspecified: Secondary | ICD-10-CM | POA: Diagnosis not present

## 2022-05-15 DIAGNOSIS — E039 Hypothyroidism, unspecified: Secondary | ICD-10-CM | POA: Diagnosis not present

## 2022-05-15 DIAGNOSIS — I2510 Atherosclerotic heart disease of native coronary artery without angina pectoris: Secondary | ICD-10-CM | POA: Diagnosis not present

## 2022-05-15 DIAGNOSIS — I1310 Hypertensive heart and chronic kidney disease without heart failure, with stage 1 through stage 4 chronic kidney disease, or unspecified chronic kidney disease: Secondary | ICD-10-CM | POA: Diagnosis not present

## 2022-06-08 DIAGNOSIS — M79605 Pain in left leg: Secondary | ICD-10-CM | POA: Diagnosis not present

## 2022-06-10 DIAGNOSIS — U071 COVID-19: Secondary | ICD-10-CM | POA: Diagnosis not present

## 2022-06-10 DIAGNOSIS — R059 Cough, unspecified: Secondary | ICD-10-CM | POA: Diagnosis not present

## 2022-06-10 DIAGNOSIS — R0602 Shortness of breath: Secondary | ICD-10-CM | POA: Diagnosis not present

## 2022-06-30 DIAGNOSIS — E785 Hyperlipidemia, unspecified: Secondary | ICD-10-CM | POA: Diagnosis not present

## 2022-06-30 DIAGNOSIS — Z993 Dependence on wheelchair: Secondary | ICD-10-CM | POA: Diagnosis not present

## 2022-06-30 DIAGNOSIS — I129 Hypertensive chronic kidney disease with stage 1 through stage 4 chronic kidney disease, or unspecified chronic kidney disease: Secondary | ICD-10-CM | POA: Diagnosis not present

## 2022-06-30 DIAGNOSIS — R1312 Dysphagia, oropharyngeal phase: Secondary | ICD-10-CM | POA: Diagnosis not present

## 2022-06-30 DIAGNOSIS — M6281 Muscle weakness (generalized): Secondary | ICD-10-CM | POA: Diagnosis not present

## 2022-06-30 DIAGNOSIS — N1832 Chronic kidney disease, stage 3b: Secondary | ICD-10-CM | POA: Diagnosis not present

## 2022-07-01 DIAGNOSIS — E785 Hyperlipidemia, unspecified: Secondary | ICD-10-CM | POA: Diagnosis not present

## 2022-07-01 DIAGNOSIS — I129 Hypertensive chronic kidney disease with stage 1 through stage 4 chronic kidney disease, or unspecified chronic kidney disease: Secondary | ICD-10-CM | POA: Diagnosis not present

## 2022-07-01 DIAGNOSIS — R1312 Dysphagia, oropharyngeal phase: Secondary | ICD-10-CM | POA: Diagnosis not present

## 2022-07-01 DIAGNOSIS — M6281 Muscle weakness (generalized): Secondary | ICD-10-CM | POA: Diagnosis not present

## 2022-07-01 DIAGNOSIS — N1832 Chronic kidney disease, stage 3b: Secondary | ICD-10-CM | POA: Diagnosis not present

## 2022-07-01 DIAGNOSIS — Z993 Dependence on wheelchair: Secondary | ICD-10-CM | POA: Diagnosis not present

## 2022-07-02 DIAGNOSIS — E039 Hypothyroidism, unspecified: Secondary | ICD-10-CM | POA: Diagnosis not present

## 2022-07-02 DIAGNOSIS — I129 Hypertensive chronic kidney disease with stage 1 through stage 4 chronic kidney disease, or unspecified chronic kidney disease: Secondary | ICD-10-CM | POA: Diagnosis not present

## 2022-07-02 DIAGNOSIS — I1310 Hypertensive heart and chronic kidney disease without heart failure, with stage 1 through stage 4 chronic kidney disease, or unspecified chronic kidney disease: Secondary | ICD-10-CM | POA: Diagnosis not present

## 2022-07-02 DIAGNOSIS — M6281 Muscle weakness (generalized): Secondary | ICD-10-CM | POA: Diagnosis not present

## 2022-07-02 DIAGNOSIS — Z993 Dependence on wheelchair: Secondary | ICD-10-CM | POA: Diagnosis not present

## 2022-07-02 DIAGNOSIS — R1312 Dysphagia, oropharyngeal phase: Secondary | ICD-10-CM | POA: Diagnosis not present

## 2022-07-02 DIAGNOSIS — N1832 Chronic kidney disease, stage 3b: Secondary | ICD-10-CM | POA: Diagnosis not present

## 2022-07-02 DIAGNOSIS — I2510 Atherosclerotic heart disease of native coronary artery without angina pectoris: Secondary | ICD-10-CM | POA: Diagnosis not present

## 2022-07-02 DIAGNOSIS — E785 Hyperlipidemia, unspecified: Secondary | ICD-10-CM | POA: Diagnosis not present

## 2022-07-02 DIAGNOSIS — U071 COVID-19: Secondary | ICD-10-CM | POA: Diagnosis not present

## 2022-07-03 DIAGNOSIS — M6281 Muscle weakness (generalized): Secondary | ICD-10-CM | POA: Diagnosis not present

## 2022-07-03 DIAGNOSIS — N1832 Chronic kidney disease, stage 3b: Secondary | ICD-10-CM | POA: Diagnosis not present

## 2022-07-03 DIAGNOSIS — R1312 Dysphagia, oropharyngeal phase: Secondary | ICD-10-CM | POA: Diagnosis not present

## 2022-07-03 DIAGNOSIS — E785 Hyperlipidemia, unspecified: Secondary | ICD-10-CM | POA: Diagnosis not present

## 2022-07-03 DIAGNOSIS — I129 Hypertensive chronic kidney disease with stage 1 through stage 4 chronic kidney disease, or unspecified chronic kidney disease: Secondary | ICD-10-CM | POA: Diagnosis not present

## 2022-07-03 DIAGNOSIS — Z993 Dependence on wheelchair: Secondary | ICD-10-CM | POA: Diagnosis not present

## 2022-07-06 DIAGNOSIS — M6281 Muscle weakness (generalized): Secondary | ICD-10-CM | POA: Diagnosis not present

## 2022-07-06 DIAGNOSIS — R1312 Dysphagia, oropharyngeal phase: Secondary | ICD-10-CM | POA: Diagnosis not present

## 2022-07-06 DIAGNOSIS — I129 Hypertensive chronic kidney disease with stage 1 through stage 4 chronic kidney disease, or unspecified chronic kidney disease: Secondary | ICD-10-CM | POA: Diagnosis not present

## 2022-07-06 DIAGNOSIS — E785 Hyperlipidemia, unspecified: Secondary | ICD-10-CM | POA: Diagnosis not present

## 2022-07-06 DIAGNOSIS — Z993 Dependence on wheelchair: Secondary | ICD-10-CM | POA: Diagnosis not present

## 2022-07-06 DIAGNOSIS — N1832 Chronic kidney disease, stage 3b: Secondary | ICD-10-CM | POA: Diagnosis not present

## 2022-07-08 DIAGNOSIS — N1832 Chronic kidney disease, stage 3b: Secondary | ICD-10-CM | POA: Diagnosis not present

## 2022-07-08 DIAGNOSIS — I129 Hypertensive chronic kidney disease with stage 1 through stage 4 chronic kidney disease, or unspecified chronic kidney disease: Secondary | ICD-10-CM | POA: Diagnosis not present

## 2022-07-08 DIAGNOSIS — R1312 Dysphagia, oropharyngeal phase: Secondary | ICD-10-CM | POA: Diagnosis not present

## 2022-07-08 DIAGNOSIS — M6281 Muscle weakness (generalized): Secondary | ICD-10-CM | POA: Diagnosis not present

## 2022-07-08 DIAGNOSIS — Z993 Dependence on wheelchair: Secondary | ICD-10-CM | POA: Diagnosis not present

## 2022-07-08 DIAGNOSIS — E785 Hyperlipidemia, unspecified: Secondary | ICD-10-CM | POA: Diagnosis not present

## 2022-07-09 DIAGNOSIS — Z993 Dependence on wheelchair: Secondary | ICD-10-CM | POA: Diagnosis not present

## 2022-07-09 DIAGNOSIS — E785 Hyperlipidemia, unspecified: Secondary | ICD-10-CM | POA: Diagnosis not present

## 2022-07-09 DIAGNOSIS — I129 Hypertensive chronic kidney disease with stage 1 through stage 4 chronic kidney disease, or unspecified chronic kidney disease: Secondary | ICD-10-CM | POA: Diagnosis not present

## 2022-07-09 DIAGNOSIS — R1312 Dysphagia, oropharyngeal phase: Secondary | ICD-10-CM | POA: Diagnosis not present

## 2022-07-09 DIAGNOSIS — M6281 Muscle weakness (generalized): Secondary | ICD-10-CM | POA: Diagnosis not present

## 2022-07-09 DIAGNOSIS — N1832 Chronic kidney disease, stage 3b: Secondary | ICD-10-CM | POA: Diagnosis not present

## 2022-07-10 DIAGNOSIS — M6281 Muscle weakness (generalized): Secondary | ICD-10-CM | POA: Diagnosis not present

## 2022-07-10 DIAGNOSIS — Z993 Dependence on wheelchair: Secondary | ICD-10-CM | POA: Diagnosis not present

## 2022-07-10 DIAGNOSIS — N1832 Chronic kidney disease, stage 3b: Secondary | ICD-10-CM | POA: Diagnosis not present

## 2022-07-10 DIAGNOSIS — E785 Hyperlipidemia, unspecified: Secondary | ICD-10-CM | POA: Diagnosis not present

## 2022-07-10 DIAGNOSIS — I129 Hypertensive chronic kidney disease with stage 1 through stage 4 chronic kidney disease, or unspecified chronic kidney disease: Secondary | ICD-10-CM | POA: Diagnosis not present

## 2022-07-10 DIAGNOSIS — R1312 Dysphagia, oropharyngeal phase: Secondary | ICD-10-CM | POA: Diagnosis not present

## 2022-07-11 DIAGNOSIS — Z993 Dependence on wheelchair: Secondary | ICD-10-CM | POA: Diagnosis not present

## 2022-07-11 DIAGNOSIS — N1832 Chronic kidney disease, stage 3b: Secondary | ICD-10-CM | POA: Diagnosis not present

## 2022-07-11 DIAGNOSIS — E785 Hyperlipidemia, unspecified: Secondary | ICD-10-CM | POA: Diagnosis not present

## 2022-07-11 DIAGNOSIS — I129 Hypertensive chronic kidney disease with stage 1 through stage 4 chronic kidney disease, or unspecified chronic kidney disease: Secondary | ICD-10-CM | POA: Diagnosis not present

## 2022-07-11 DIAGNOSIS — R1312 Dysphagia, oropharyngeal phase: Secondary | ICD-10-CM | POA: Diagnosis not present

## 2022-07-11 DIAGNOSIS — M6281 Muscle weakness (generalized): Secondary | ICD-10-CM | POA: Diagnosis not present

## 2022-07-12 DIAGNOSIS — Z993 Dependence on wheelchair: Secondary | ICD-10-CM | POA: Diagnosis not present

## 2022-07-12 DIAGNOSIS — N1832 Chronic kidney disease, stage 3b: Secondary | ICD-10-CM | POA: Diagnosis not present

## 2022-07-12 DIAGNOSIS — R1312 Dysphagia, oropharyngeal phase: Secondary | ICD-10-CM | POA: Diagnosis not present

## 2022-07-12 DIAGNOSIS — M6281 Muscle weakness (generalized): Secondary | ICD-10-CM | POA: Diagnosis not present

## 2022-07-12 DIAGNOSIS — E785 Hyperlipidemia, unspecified: Secondary | ICD-10-CM | POA: Diagnosis not present

## 2022-07-12 DIAGNOSIS — I129 Hypertensive chronic kidney disease with stage 1 through stage 4 chronic kidney disease, or unspecified chronic kidney disease: Secondary | ICD-10-CM | POA: Diagnosis not present

## 2022-07-14 DIAGNOSIS — N1832 Chronic kidney disease, stage 3b: Secondary | ICD-10-CM | POA: Diagnosis not present

## 2022-07-14 DIAGNOSIS — R1312 Dysphagia, oropharyngeal phase: Secondary | ICD-10-CM | POA: Diagnosis not present

## 2022-07-14 DIAGNOSIS — M6281 Muscle weakness (generalized): Secondary | ICD-10-CM | POA: Diagnosis not present

## 2022-07-14 DIAGNOSIS — I129 Hypertensive chronic kidney disease with stage 1 through stage 4 chronic kidney disease, or unspecified chronic kidney disease: Secondary | ICD-10-CM | POA: Diagnosis not present

## 2022-07-14 DIAGNOSIS — Z993 Dependence on wheelchair: Secondary | ICD-10-CM | POA: Diagnosis not present

## 2022-07-14 DIAGNOSIS — E785 Hyperlipidemia, unspecified: Secondary | ICD-10-CM | POA: Diagnosis not present

## 2022-07-15 DIAGNOSIS — E785 Hyperlipidemia, unspecified: Secondary | ICD-10-CM | POA: Diagnosis not present

## 2022-07-15 DIAGNOSIS — N1832 Chronic kidney disease, stage 3b: Secondary | ICD-10-CM | POA: Diagnosis not present

## 2022-07-15 DIAGNOSIS — M6281 Muscle weakness (generalized): Secondary | ICD-10-CM | POA: Diagnosis not present

## 2022-07-15 DIAGNOSIS — Z993 Dependence on wheelchair: Secondary | ICD-10-CM | POA: Diagnosis not present

## 2022-07-15 DIAGNOSIS — R1312 Dysphagia, oropharyngeal phase: Secondary | ICD-10-CM | POA: Diagnosis not present

## 2022-07-15 DIAGNOSIS — I129 Hypertensive chronic kidney disease with stage 1 through stage 4 chronic kidney disease, or unspecified chronic kidney disease: Secondary | ICD-10-CM | POA: Diagnosis not present

## 2022-07-17 DIAGNOSIS — N1832 Chronic kidney disease, stage 3b: Secondary | ICD-10-CM | POA: Diagnosis not present

## 2022-07-17 DIAGNOSIS — E785 Hyperlipidemia, unspecified: Secondary | ICD-10-CM | POA: Diagnosis not present

## 2022-07-17 DIAGNOSIS — R1312 Dysphagia, oropharyngeal phase: Secondary | ICD-10-CM | POA: Diagnosis not present

## 2022-07-17 DIAGNOSIS — I129 Hypertensive chronic kidney disease with stage 1 through stage 4 chronic kidney disease, or unspecified chronic kidney disease: Secondary | ICD-10-CM | POA: Diagnosis not present

## 2022-07-17 DIAGNOSIS — Z993 Dependence on wheelchair: Secondary | ICD-10-CM | POA: Diagnosis not present

## 2022-07-17 DIAGNOSIS — M6281 Muscle weakness (generalized): Secondary | ICD-10-CM | POA: Diagnosis not present

## 2022-07-20 DIAGNOSIS — I129 Hypertensive chronic kidney disease with stage 1 through stage 4 chronic kidney disease, or unspecified chronic kidney disease: Secondary | ICD-10-CM | POA: Diagnosis not present

## 2022-07-20 DIAGNOSIS — Z993 Dependence on wheelchair: Secondary | ICD-10-CM | POA: Diagnosis not present

## 2022-07-20 DIAGNOSIS — N1832 Chronic kidney disease, stage 3b: Secondary | ICD-10-CM | POA: Diagnosis not present

## 2022-07-20 DIAGNOSIS — E785 Hyperlipidemia, unspecified: Secondary | ICD-10-CM | POA: Diagnosis not present

## 2022-07-20 DIAGNOSIS — M6281 Muscle weakness (generalized): Secondary | ICD-10-CM | POA: Diagnosis not present

## 2022-07-21 DIAGNOSIS — N1832 Chronic kidney disease, stage 3b: Secondary | ICD-10-CM | POA: Diagnosis not present

## 2022-07-21 DIAGNOSIS — I129 Hypertensive chronic kidney disease with stage 1 through stage 4 chronic kidney disease, or unspecified chronic kidney disease: Secondary | ICD-10-CM | POA: Diagnosis not present

## 2022-07-21 DIAGNOSIS — M6281 Muscle weakness (generalized): Secondary | ICD-10-CM | POA: Diagnosis not present

## 2022-07-21 DIAGNOSIS — E785 Hyperlipidemia, unspecified: Secondary | ICD-10-CM | POA: Diagnosis not present

## 2022-07-21 DIAGNOSIS — Z993 Dependence on wheelchair: Secondary | ICD-10-CM | POA: Diagnosis not present

## 2022-07-29 DIAGNOSIS — R609 Edema, unspecified: Secondary | ICD-10-CM | POA: Diagnosis not present

## 2022-09-09 DIAGNOSIS — E039 Hypothyroidism, unspecified: Secondary | ICD-10-CM | POA: Diagnosis not present

## 2022-09-09 DIAGNOSIS — F32A Depression, unspecified: Secondary | ICD-10-CM | POA: Diagnosis not present

## 2022-09-09 DIAGNOSIS — I1310 Hypertensive heart and chronic kidney disease without heart failure, with stage 1 through stage 4 chronic kidney disease, or unspecified chronic kidney disease: Secondary | ICD-10-CM | POA: Diagnosis not present

## 2022-09-09 DIAGNOSIS — E785 Hyperlipidemia, unspecified: Secondary | ICD-10-CM | POA: Diagnosis not present

## 2022-09-12 DIAGNOSIS — E039 Hypothyroidism, unspecified: Secondary | ICD-10-CM | POA: Diagnosis not present

## 2022-09-12 DIAGNOSIS — R7303 Prediabetes: Secondary | ICD-10-CM | POA: Diagnosis not present

## 2022-09-12 DIAGNOSIS — R7309 Other abnormal glucose: Secondary | ICD-10-CM | POA: Diagnosis not present

## 2022-09-12 DIAGNOSIS — I10 Essential (primary) hypertension: Secondary | ICD-10-CM | POA: Diagnosis not present

## 2022-09-12 DIAGNOSIS — E0590 Thyrotoxicosis, unspecified without thyrotoxic crisis or storm: Secondary | ICD-10-CM | POA: Diagnosis not present

## 2022-09-23 DIAGNOSIS — I10 Essential (primary) hypertension: Secondary | ICD-10-CM | POA: Diagnosis not present

## 2022-09-23 DIAGNOSIS — K649 Unspecified hemorrhoids: Secondary | ICD-10-CM | POA: Diagnosis not present

## 2022-09-23 DIAGNOSIS — E559 Vitamin D deficiency, unspecified: Secondary | ICD-10-CM | POA: Diagnosis not present

## 2022-09-23 DIAGNOSIS — E785 Hyperlipidemia, unspecified: Secondary | ICD-10-CM | POA: Diagnosis not present

## 2022-09-27 DIAGNOSIS — M6281 Muscle weakness (generalized): Secondary | ICD-10-CM | POA: Diagnosis not present

## 2022-09-27 DIAGNOSIS — R2689 Other abnormalities of gait and mobility: Secondary | ICD-10-CM | POA: Diagnosis not present

## 2022-09-27 DIAGNOSIS — N1832 Chronic kidney disease, stage 3b: Secondary | ICD-10-CM | POA: Diagnosis not present

## 2022-09-30 DIAGNOSIS — R2689 Other abnormalities of gait and mobility: Secondary | ICD-10-CM | POA: Diagnosis not present

## 2022-09-30 DIAGNOSIS — M6281 Muscle weakness (generalized): Secondary | ICD-10-CM | POA: Diagnosis not present

## 2022-09-30 DIAGNOSIS — N1832 Chronic kidney disease, stage 3b: Secondary | ICD-10-CM | POA: Diagnosis not present

## 2022-10-01 DIAGNOSIS — M6281 Muscle weakness (generalized): Secondary | ICD-10-CM | POA: Diagnosis not present

## 2022-10-01 DIAGNOSIS — N1832 Chronic kidney disease, stage 3b: Secondary | ICD-10-CM | POA: Diagnosis not present

## 2022-10-01 DIAGNOSIS — R2689 Other abnormalities of gait and mobility: Secondary | ICD-10-CM | POA: Diagnosis not present

## 2022-10-02 DIAGNOSIS — N1832 Chronic kidney disease, stage 3b: Secondary | ICD-10-CM | POA: Diagnosis not present

## 2022-10-02 DIAGNOSIS — R2689 Other abnormalities of gait and mobility: Secondary | ICD-10-CM | POA: Diagnosis not present

## 2022-10-02 DIAGNOSIS — M6281 Muscle weakness (generalized): Secondary | ICD-10-CM | POA: Diagnosis not present

## 2022-10-05 DIAGNOSIS — M6281 Muscle weakness (generalized): Secondary | ICD-10-CM | POA: Diagnosis not present

## 2022-10-05 DIAGNOSIS — N1832 Chronic kidney disease, stage 3b: Secondary | ICD-10-CM | POA: Diagnosis not present

## 2022-10-05 DIAGNOSIS — R2689 Other abnormalities of gait and mobility: Secondary | ICD-10-CM | POA: Diagnosis not present

## 2022-10-06 DIAGNOSIS — M6281 Muscle weakness (generalized): Secondary | ICD-10-CM | POA: Diagnosis not present

## 2022-10-06 DIAGNOSIS — N1832 Chronic kidney disease, stage 3b: Secondary | ICD-10-CM | POA: Diagnosis not present

## 2022-10-06 DIAGNOSIS — R2689 Other abnormalities of gait and mobility: Secondary | ICD-10-CM | POA: Diagnosis not present

## 2022-10-07 DIAGNOSIS — M6281 Muscle weakness (generalized): Secondary | ICD-10-CM | POA: Diagnosis not present

## 2022-10-07 DIAGNOSIS — N1832 Chronic kidney disease, stage 3b: Secondary | ICD-10-CM | POA: Diagnosis not present

## 2022-10-07 DIAGNOSIS — R2689 Other abnormalities of gait and mobility: Secondary | ICD-10-CM | POA: Diagnosis not present

## 2022-10-08 DIAGNOSIS — N1832 Chronic kidney disease, stage 3b: Secondary | ICD-10-CM | POA: Diagnosis not present

## 2022-10-08 DIAGNOSIS — M6281 Muscle weakness (generalized): Secondary | ICD-10-CM | POA: Diagnosis not present

## 2022-10-08 DIAGNOSIS — R2689 Other abnormalities of gait and mobility: Secondary | ICD-10-CM | POA: Diagnosis not present

## 2022-10-09 DIAGNOSIS — M6281 Muscle weakness (generalized): Secondary | ICD-10-CM | POA: Diagnosis not present

## 2022-10-09 DIAGNOSIS — R2689 Other abnormalities of gait and mobility: Secondary | ICD-10-CM | POA: Diagnosis not present

## 2022-10-09 DIAGNOSIS — N1832 Chronic kidney disease, stage 3b: Secondary | ICD-10-CM | POA: Diagnosis not present

## 2022-10-12 DIAGNOSIS — R2689 Other abnormalities of gait and mobility: Secondary | ICD-10-CM | POA: Diagnosis not present

## 2022-10-12 DIAGNOSIS — M6281 Muscle weakness (generalized): Secondary | ICD-10-CM | POA: Diagnosis not present

## 2022-10-12 DIAGNOSIS — N1832 Chronic kidney disease, stage 3b: Secondary | ICD-10-CM | POA: Diagnosis not present

## 2022-10-13 DIAGNOSIS — M6281 Muscle weakness (generalized): Secondary | ICD-10-CM | POA: Diagnosis not present

## 2022-10-13 DIAGNOSIS — R2689 Other abnormalities of gait and mobility: Secondary | ICD-10-CM | POA: Diagnosis not present

## 2022-10-13 DIAGNOSIS — N1832 Chronic kidney disease, stage 3b: Secondary | ICD-10-CM | POA: Diagnosis not present

## 2022-10-15 DIAGNOSIS — R2689 Other abnormalities of gait and mobility: Secondary | ICD-10-CM | POA: Diagnosis not present

## 2022-10-15 DIAGNOSIS — N1832 Chronic kidney disease, stage 3b: Secondary | ICD-10-CM | POA: Diagnosis not present

## 2022-10-15 DIAGNOSIS — M6281 Muscle weakness (generalized): Secondary | ICD-10-CM | POA: Diagnosis not present

## 2022-10-16 DIAGNOSIS — N1832 Chronic kidney disease, stage 3b: Secondary | ICD-10-CM | POA: Diagnosis not present

## 2022-10-16 DIAGNOSIS — M6281 Muscle weakness (generalized): Secondary | ICD-10-CM | POA: Diagnosis not present

## 2022-10-16 DIAGNOSIS — R2689 Other abnormalities of gait and mobility: Secondary | ICD-10-CM | POA: Diagnosis not present

## 2022-10-17 DIAGNOSIS — R2689 Other abnormalities of gait and mobility: Secondary | ICD-10-CM | POA: Diagnosis not present

## 2022-10-17 DIAGNOSIS — N1832 Chronic kidney disease, stage 3b: Secondary | ICD-10-CM | POA: Diagnosis not present

## 2022-10-17 DIAGNOSIS — M6281 Muscle weakness (generalized): Secondary | ICD-10-CM | POA: Diagnosis not present

## 2022-10-20 DIAGNOSIS — N1832 Chronic kidney disease, stage 3b: Secondary | ICD-10-CM | POA: Diagnosis not present

## 2022-10-20 DIAGNOSIS — R2689 Other abnormalities of gait and mobility: Secondary | ICD-10-CM | POA: Diagnosis not present

## 2022-10-20 DIAGNOSIS — M6281 Muscle weakness (generalized): Secondary | ICD-10-CM | POA: Diagnosis not present

## 2022-10-21 DIAGNOSIS — M6281 Muscle weakness (generalized): Secondary | ICD-10-CM | POA: Diagnosis not present

## 2022-10-21 DIAGNOSIS — N1832 Chronic kidney disease, stage 3b: Secondary | ICD-10-CM | POA: Diagnosis not present

## 2022-10-21 DIAGNOSIS — R2689 Other abnormalities of gait and mobility: Secondary | ICD-10-CM | POA: Diagnosis not present

## 2022-10-22 DIAGNOSIS — R2689 Other abnormalities of gait and mobility: Secondary | ICD-10-CM | POA: Diagnosis not present

## 2022-10-22 DIAGNOSIS — N1832 Chronic kidney disease, stage 3b: Secondary | ICD-10-CM | POA: Diagnosis not present

## 2022-10-22 DIAGNOSIS — M6281 Muscle weakness (generalized): Secondary | ICD-10-CM | POA: Diagnosis not present

## 2022-10-23 DIAGNOSIS — N1832 Chronic kidney disease, stage 3b: Secondary | ICD-10-CM | POA: Diagnosis not present

## 2022-10-23 DIAGNOSIS — R2689 Other abnormalities of gait and mobility: Secondary | ICD-10-CM | POA: Diagnosis not present

## 2022-10-23 DIAGNOSIS — M6281 Muscle weakness (generalized): Secondary | ICD-10-CM | POA: Diagnosis not present

## 2022-10-24 DIAGNOSIS — N1832 Chronic kidney disease, stage 3b: Secondary | ICD-10-CM | POA: Diagnosis not present

## 2022-10-24 DIAGNOSIS — M6281 Muscle weakness (generalized): Secondary | ICD-10-CM | POA: Diagnosis not present

## 2022-10-24 DIAGNOSIS — R2689 Other abnormalities of gait and mobility: Secondary | ICD-10-CM | POA: Diagnosis not present

## 2022-10-26 DIAGNOSIS — N1832 Chronic kidney disease, stage 3b: Secondary | ICD-10-CM | POA: Diagnosis not present

## 2022-10-26 DIAGNOSIS — R2689 Other abnormalities of gait and mobility: Secondary | ICD-10-CM | POA: Diagnosis not present

## 2022-10-26 DIAGNOSIS — M6281 Muscle weakness (generalized): Secondary | ICD-10-CM | POA: Diagnosis not present

## 2022-10-27 DIAGNOSIS — M6281 Muscle weakness (generalized): Secondary | ICD-10-CM | POA: Diagnosis not present

## 2022-10-27 DIAGNOSIS — R2689 Other abnormalities of gait and mobility: Secondary | ICD-10-CM | POA: Diagnosis not present

## 2022-10-27 DIAGNOSIS — N1832 Chronic kidney disease, stage 3b: Secondary | ICD-10-CM | POA: Diagnosis not present

## 2022-10-28 DIAGNOSIS — R2689 Other abnormalities of gait and mobility: Secondary | ICD-10-CM | POA: Diagnosis not present

## 2022-10-28 DIAGNOSIS — M6281 Muscle weakness (generalized): Secondary | ICD-10-CM | POA: Diagnosis not present

## 2022-10-28 DIAGNOSIS — N1832 Chronic kidney disease, stage 3b: Secondary | ICD-10-CM | POA: Diagnosis not present

## 2022-10-30 DIAGNOSIS — M6281 Muscle weakness (generalized): Secondary | ICD-10-CM | POA: Diagnosis not present

## 2022-10-30 DIAGNOSIS — R2689 Other abnormalities of gait and mobility: Secondary | ICD-10-CM | POA: Diagnosis not present

## 2022-10-30 DIAGNOSIS — N1832 Chronic kidney disease, stage 3b: Secondary | ICD-10-CM | POA: Diagnosis not present

## 2022-10-31 DIAGNOSIS — M6281 Muscle weakness (generalized): Secondary | ICD-10-CM | POA: Diagnosis not present

## 2022-10-31 DIAGNOSIS — N1832 Chronic kidney disease, stage 3b: Secondary | ICD-10-CM | POA: Diagnosis not present

## 2022-10-31 DIAGNOSIS — R2689 Other abnormalities of gait and mobility: Secondary | ICD-10-CM | POA: Diagnosis not present

## 2022-11-02 DIAGNOSIS — W19XXXA Unspecified fall, initial encounter: Secondary | ICD-10-CM | POA: Diagnosis not present

## 2022-11-02 DIAGNOSIS — N1832 Chronic kidney disease, stage 3b: Secondary | ICD-10-CM | POA: Diagnosis not present

## 2022-11-02 DIAGNOSIS — E039 Hypothyroidism, unspecified: Secondary | ICD-10-CM | POA: Diagnosis not present

## 2022-11-02 DIAGNOSIS — M6281 Muscle weakness (generalized): Secondary | ICD-10-CM | POA: Diagnosis not present

## 2022-11-02 DIAGNOSIS — R2689 Other abnormalities of gait and mobility: Secondary | ICD-10-CM | POA: Diagnosis not present

## 2022-11-03 DIAGNOSIS — R2689 Other abnormalities of gait and mobility: Secondary | ICD-10-CM | POA: Diagnosis not present

## 2022-11-03 DIAGNOSIS — M6281 Muscle weakness (generalized): Secondary | ICD-10-CM | POA: Diagnosis not present

## 2022-11-03 DIAGNOSIS — N1832 Chronic kidney disease, stage 3b: Secondary | ICD-10-CM | POA: Diagnosis not present

## 2022-11-04 DIAGNOSIS — M6281 Muscle weakness (generalized): Secondary | ICD-10-CM | POA: Diagnosis not present

## 2022-11-04 DIAGNOSIS — N1832 Chronic kidney disease, stage 3b: Secondary | ICD-10-CM | POA: Diagnosis not present

## 2022-11-04 DIAGNOSIS — R2689 Other abnormalities of gait and mobility: Secondary | ICD-10-CM | POA: Diagnosis not present

## 2022-11-04 DIAGNOSIS — F32A Depression, unspecified: Secondary | ICD-10-CM | POA: Diagnosis not present

## 2022-11-05 DIAGNOSIS — I2510 Atherosclerotic heart disease of native coronary artery without angina pectoris: Secondary | ICD-10-CM | POA: Diagnosis not present

## 2022-11-05 DIAGNOSIS — E039 Hypothyroidism, unspecified: Secondary | ICD-10-CM | POA: Diagnosis not present

## 2022-11-05 DIAGNOSIS — I1310 Hypertensive heart and chronic kidney disease without heart failure, with stage 1 through stage 4 chronic kidney disease, or unspecified chronic kidney disease: Secondary | ICD-10-CM | POA: Diagnosis not present

## 2022-11-05 DIAGNOSIS — R2689 Other abnormalities of gait and mobility: Secondary | ICD-10-CM | POA: Diagnosis not present

## 2022-11-05 DIAGNOSIS — N1832 Chronic kidney disease, stage 3b: Secondary | ICD-10-CM | POA: Diagnosis not present

## 2022-11-05 DIAGNOSIS — M6281 Muscle weakness (generalized): Secondary | ICD-10-CM | POA: Diagnosis not present

## 2022-11-06 DIAGNOSIS — M6281 Muscle weakness (generalized): Secondary | ICD-10-CM | POA: Diagnosis not present

## 2022-11-06 DIAGNOSIS — N1832 Chronic kidney disease, stage 3b: Secondary | ICD-10-CM | POA: Diagnosis not present

## 2022-11-06 DIAGNOSIS — R2689 Other abnormalities of gait and mobility: Secondary | ICD-10-CM | POA: Diagnosis not present

## 2022-11-09 DIAGNOSIS — N1832 Chronic kidney disease, stage 3b: Secondary | ICD-10-CM | POA: Diagnosis not present

## 2022-11-09 DIAGNOSIS — M6281 Muscle weakness (generalized): Secondary | ICD-10-CM | POA: Diagnosis not present

## 2022-11-09 DIAGNOSIS — R2689 Other abnormalities of gait and mobility: Secondary | ICD-10-CM | POA: Diagnosis not present

## 2022-11-10 DIAGNOSIS — N1832 Chronic kidney disease, stage 3b: Secondary | ICD-10-CM | POA: Diagnosis not present

## 2022-11-10 DIAGNOSIS — M6281 Muscle weakness (generalized): Secondary | ICD-10-CM | POA: Diagnosis not present

## 2022-11-10 DIAGNOSIS — R2689 Other abnormalities of gait and mobility: Secondary | ICD-10-CM | POA: Diagnosis not present

## 2022-11-11 DIAGNOSIS — R2689 Other abnormalities of gait and mobility: Secondary | ICD-10-CM | POA: Diagnosis not present

## 2022-11-11 DIAGNOSIS — M6281 Muscle weakness (generalized): Secondary | ICD-10-CM | POA: Diagnosis not present

## 2022-11-11 DIAGNOSIS — N1832 Chronic kidney disease, stage 3b: Secondary | ICD-10-CM | POA: Diagnosis not present

## 2022-12-07 DIAGNOSIS — E559 Vitamin D deficiency, unspecified: Secondary | ICD-10-CM | POA: Diagnosis not present

## 2022-12-07 DIAGNOSIS — R609 Edema, unspecified: Secondary | ICD-10-CM | POA: Diagnosis not present

## 2022-12-07 DIAGNOSIS — G629 Polyneuropathy, unspecified: Secondary | ICD-10-CM | POA: Diagnosis not present

## 2022-12-07 DIAGNOSIS — E785 Hyperlipidemia, unspecified: Secondary | ICD-10-CM | POA: Diagnosis not present

## 2023-01-07 DIAGNOSIS — M6281 Muscle weakness (generalized): Secondary | ICD-10-CM | POA: Diagnosis not present

## 2023-01-07 DIAGNOSIS — I1310 Hypertensive heart and chronic kidney disease without heart failure, with stage 1 through stage 4 chronic kidney disease, or unspecified chronic kidney disease: Secondary | ICD-10-CM | POA: Diagnosis not present

## 2023-01-07 DIAGNOSIS — E039 Hypothyroidism, unspecified: Secondary | ICD-10-CM | POA: Diagnosis not present

## 2023-01-07 DIAGNOSIS — I2510 Atherosclerotic heart disease of native coronary artery without angina pectoris: Secondary | ICD-10-CM | POA: Diagnosis not present

## 2023-01-15 DIAGNOSIS — M6281 Muscle weakness (generalized): Secondary | ICD-10-CM | POA: Diagnosis not present

## 2023-01-15 DIAGNOSIS — I129 Hypertensive chronic kidney disease with stage 1 through stage 4 chronic kidney disease, or unspecified chronic kidney disease: Secondary | ICD-10-CM | POA: Diagnosis not present

## 2023-01-15 DIAGNOSIS — N1832 Chronic kidney disease, stage 3b: Secondary | ICD-10-CM | POA: Diagnosis not present

## 2023-01-18 DIAGNOSIS — M79606 Pain in leg, unspecified: Secondary | ICD-10-CM | POA: Diagnosis not present

## 2023-01-18 DIAGNOSIS — M6281 Muscle weakness (generalized): Secondary | ICD-10-CM | POA: Diagnosis not present

## 2023-01-18 DIAGNOSIS — N1832 Chronic kidney disease, stage 3b: Secondary | ICD-10-CM | POA: Diagnosis not present

## 2023-01-18 DIAGNOSIS — I129 Hypertensive chronic kidney disease with stage 1 through stage 4 chronic kidney disease, or unspecified chronic kidney disease: Secondary | ICD-10-CM | POA: Diagnosis not present

## 2023-01-19 DIAGNOSIS — N1832 Chronic kidney disease, stage 3b: Secondary | ICD-10-CM | POA: Diagnosis not present

## 2023-01-19 DIAGNOSIS — M6281 Muscle weakness (generalized): Secondary | ICD-10-CM | POA: Diagnosis not present

## 2023-01-19 DIAGNOSIS — M79606 Pain in leg, unspecified: Secondary | ICD-10-CM | POA: Diagnosis not present

## 2023-01-19 DIAGNOSIS — I129 Hypertensive chronic kidney disease with stage 1 through stage 4 chronic kidney disease, or unspecified chronic kidney disease: Secondary | ICD-10-CM | POA: Diagnosis not present

## 2023-01-20 DIAGNOSIS — N1832 Chronic kidney disease, stage 3b: Secondary | ICD-10-CM | POA: Diagnosis not present

## 2023-01-20 DIAGNOSIS — I129 Hypertensive chronic kidney disease with stage 1 through stage 4 chronic kidney disease, or unspecified chronic kidney disease: Secondary | ICD-10-CM | POA: Diagnosis not present

## 2023-01-20 DIAGNOSIS — M79606 Pain in leg, unspecified: Secondary | ICD-10-CM | POA: Diagnosis not present

## 2023-01-20 DIAGNOSIS — M6281 Muscle weakness (generalized): Secondary | ICD-10-CM | POA: Diagnosis not present

## 2023-01-21 DIAGNOSIS — M6281 Muscle weakness (generalized): Secondary | ICD-10-CM | POA: Diagnosis not present

## 2023-01-21 DIAGNOSIS — N1832 Chronic kidney disease, stage 3b: Secondary | ICD-10-CM | POA: Diagnosis not present

## 2023-01-21 DIAGNOSIS — M79606 Pain in leg, unspecified: Secondary | ICD-10-CM | POA: Diagnosis not present

## 2023-01-21 DIAGNOSIS — I129 Hypertensive chronic kidney disease with stage 1 through stage 4 chronic kidney disease, or unspecified chronic kidney disease: Secondary | ICD-10-CM | POA: Diagnosis not present

## 2023-01-22 DIAGNOSIS — N1832 Chronic kidney disease, stage 3b: Secondary | ICD-10-CM | POA: Diagnosis not present

## 2023-01-22 DIAGNOSIS — I129 Hypertensive chronic kidney disease with stage 1 through stage 4 chronic kidney disease, or unspecified chronic kidney disease: Secondary | ICD-10-CM | POA: Diagnosis not present

## 2023-01-22 DIAGNOSIS — M6281 Muscle weakness (generalized): Secondary | ICD-10-CM | POA: Diagnosis not present

## 2023-01-22 DIAGNOSIS — M79606 Pain in leg, unspecified: Secondary | ICD-10-CM | POA: Diagnosis not present

## 2023-01-25 DIAGNOSIS — M6281 Muscle weakness (generalized): Secondary | ICD-10-CM | POA: Diagnosis not present

## 2023-01-25 DIAGNOSIS — N1832 Chronic kidney disease, stage 3b: Secondary | ICD-10-CM | POA: Diagnosis not present

## 2023-01-25 DIAGNOSIS — M79606 Pain in leg, unspecified: Secondary | ICD-10-CM | POA: Diagnosis not present

## 2023-01-25 DIAGNOSIS — I129 Hypertensive chronic kidney disease with stage 1 through stage 4 chronic kidney disease, or unspecified chronic kidney disease: Secondary | ICD-10-CM | POA: Diagnosis not present

## 2023-01-26 DIAGNOSIS — N1832 Chronic kidney disease, stage 3b: Secondary | ICD-10-CM | POA: Diagnosis not present

## 2023-01-26 DIAGNOSIS — M6281 Muscle weakness (generalized): Secondary | ICD-10-CM | POA: Diagnosis not present

## 2023-01-26 DIAGNOSIS — I129 Hypertensive chronic kidney disease with stage 1 through stage 4 chronic kidney disease, or unspecified chronic kidney disease: Secondary | ICD-10-CM | POA: Diagnosis not present

## 2023-01-26 DIAGNOSIS — M79606 Pain in leg, unspecified: Secondary | ICD-10-CM | POA: Diagnosis not present

## 2023-01-27 DIAGNOSIS — I129 Hypertensive chronic kidney disease with stage 1 through stage 4 chronic kidney disease, or unspecified chronic kidney disease: Secondary | ICD-10-CM | POA: Diagnosis not present

## 2023-01-27 DIAGNOSIS — M79606 Pain in leg, unspecified: Secondary | ICD-10-CM | POA: Diagnosis not present

## 2023-01-27 DIAGNOSIS — N1832 Chronic kidney disease, stage 3b: Secondary | ICD-10-CM | POA: Diagnosis not present

## 2023-01-27 DIAGNOSIS — M6281 Muscle weakness (generalized): Secondary | ICD-10-CM | POA: Diagnosis not present

## 2023-01-28 DIAGNOSIS — N1832 Chronic kidney disease, stage 3b: Secondary | ICD-10-CM | POA: Diagnosis not present

## 2023-01-28 DIAGNOSIS — M6281 Muscle weakness (generalized): Secondary | ICD-10-CM | POA: Diagnosis not present

## 2023-01-28 DIAGNOSIS — I129 Hypertensive chronic kidney disease with stage 1 through stage 4 chronic kidney disease, or unspecified chronic kidney disease: Secondary | ICD-10-CM | POA: Diagnosis not present

## 2023-01-28 DIAGNOSIS — M79606 Pain in leg, unspecified: Secondary | ICD-10-CM | POA: Diagnosis not present

## 2023-02-04 DIAGNOSIS — N1832 Chronic kidney disease, stage 3b: Secondary | ICD-10-CM | POA: Diagnosis not present

## 2023-02-04 DIAGNOSIS — M6281 Muscle weakness (generalized): Secondary | ICD-10-CM | POA: Diagnosis not present

## 2023-02-04 DIAGNOSIS — I129 Hypertensive chronic kidney disease with stage 1 through stage 4 chronic kidney disease, or unspecified chronic kidney disease: Secondary | ICD-10-CM | POA: Diagnosis not present

## 2023-02-04 DIAGNOSIS — M79606 Pain in leg, unspecified: Secondary | ICD-10-CM | POA: Diagnosis not present

## 2023-02-05 DIAGNOSIS — M79606 Pain in leg, unspecified: Secondary | ICD-10-CM | POA: Diagnosis not present

## 2023-02-05 DIAGNOSIS — M79605 Pain in left leg: Secondary | ICD-10-CM | POA: Diagnosis not present

## 2023-02-05 DIAGNOSIS — I1310 Hypertensive heart and chronic kidney disease without heart failure, with stage 1 through stage 4 chronic kidney disease, or unspecified chronic kidney disease: Secondary | ICD-10-CM | POA: Diagnosis not present

## 2023-02-05 DIAGNOSIS — M6281 Muscle weakness (generalized): Secondary | ICD-10-CM | POA: Diagnosis not present

## 2023-02-05 DIAGNOSIS — N1832 Chronic kidney disease, stage 3b: Secondary | ICD-10-CM | POA: Diagnosis not present

## 2023-02-05 DIAGNOSIS — I129 Hypertensive chronic kidney disease with stage 1 through stage 4 chronic kidney disease, or unspecified chronic kidney disease: Secondary | ICD-10-CM | POA: Diagnosis not present

## 2023-02-05 DIAGNOSIS — R609 Edema, unspecified: Secondary | ICD-10-CM | POA: Diagnosis not present

## 2023-02-06 DIAGNOSIS — M79606 Pain in leg, unspecified: Secondary | ICD-10-CM | POA: Diagnosis not present

## 2023-02-06 DIAGNOSIS — M6281 Muscle weakness (generalized): Secondary | ICD-10-CM | POA: Diagnosis not present

## 2023-02-06 DIAGNOSIS — I129 Hypertensive chronic kidney disease with stage 1 through stage 4 chronic kidney disease, or unspecified chronic kidney disease: Secondary | ICD-10-CM | POA: Diagnosis not present

## 2023-02-06 DIAGNOSIS — N1832 Chronic kidney disease, stage 3b: Secondary | ICD-10-CM | POA: Diagnosis not present

## 2023-02-08 DIAGNOSIS — M6281 Muscle weakness (generalized): Secondary | ICD-10-CM | POA: Diagnosis not present

## 2023-02-08 DIAGNOSIS — N1832 Chronic kidney disease, stage 3b: Secondary | ICD-10-CM | POA: Diagnosis not present

## 2023-02-08 DIAGNOSIS — I129 Hypertensive chronic kidney disease with stage 1 through stage 4 chronic kidney disease, or unspecified chronic kidney disease: Secondary | ICD-10-CM | POA: Diagnosis not present

## 2023-02-08 DIAGNOSIS — M79606 Pain in leg, unspecified: Secondary | ICD-10-CM | POA: Diagnosis not present

## 2023-02-09 DIAGNOSIS — M6281 Muscle weakness (generalized): Secondary | ICD-10-CM | POA: Diagnosis not present

## 2023-02-09 DIAGNOSIS — I129 Hypertensive chronic kidney disease with stage 1 through stage 4 chronic kidney disease, or unspecified chronic kidney disease: Secondary | ICD-10-CM | POA: Diagnosis not present

## 2023-02-09 DIAGNOSIS — N1832 Chronic kidney disease, stage 3b: Secondary | ICD-10-CM | POA: Diagnosis not present

## 2023-02-09 DIAGNOSIS — M79606 Pain in leg, unspecified: Secondary | ICD-10-CM | POA: Diagnosis not present

## 2023-02-11 DIAGNOSIS — M79606 Pain in leg, unspecified: Secondary | ICD-10-CM | POA: Diagnosis not present

## 2023-02-11 DIAGNOSIS — M6281 Muscle weakness (generalized): Secondary | ICD-10-CM | POA: Diagnosis not present

## 2023-02-11 DIAGNOSIS — N1832 Chronic kidney disease, stage 3b: Secondary | ICD-10-CM | POA: Diagnosis not present

## 2023-02-11 DIAGNOSIS — I129 Hypertensive chronic kidney disease with stage 1 through stage 4 chronic kidney disease, or unspecified chronic kidney disease: Secondary | ICD-10-CM | POA: Diagnosis not present

## 2023-02-12 DIAGNOSIS — M6281 Muscle weakness (generalized): Secondary | ICD-10-CM | POA: Diagnosis not present

## 2023-02-12 DIAGNOSIS — N1832 Chronic kidney disease, stage 3b: Secondary | ICD-10-CM | POA: Diagnosis not present

## 2023-02-12 DIAGNOSIS — M79606 Pain in leg, unspecified: Secondary | ICD-10-CM | POA: Diagnosis not present

## 2023-02-12 DIAGNOSIS — I129 Hypertensive chronic kidney disease with stage 1 through stage 4 chronic kidney disease, or unspecified chronic kidney disease: Secondary | ICD-10-CM | POA: Diagnosis not present

## 2023-02-13 DIAGNOSIS — M79606 Pain in leg, unspecified: Secondary | ICD-10-CM | POA: Diagnosis not present

## 2023-02-13 DIAGNOSIS — N1832 Chronic kidney disease, stage 3b: Secondary | ICD-10-CM | POA: Diagnosis not present

## 2023-02-13 DIAGNOSIS — M6281 Muscle weakness (generalized): Secondary | ICD-10-CM | POA: Diagnosis not present

## 2023-02-13 DIAGNOSIS — I129 Hypertensive chronic kidney disease with stage 1 through stage 4 chronic kidney disease, or unspecified chronic kidney disease: Secondary | ICD-10-CM | POA: Diagnosis not present

## 2023-02-16 DIAGNOSIS — I129 Hypertensive chronic kidney disease with stage 1 through stage 4 chronic kidney disease, or unspecified chronic kidney disease: Secondary | ICD-10-CM | POA: Diagnosis not present

## 2023-02-16 DIAGNOSIS — M79606 Pain in leg, unspecified: Secondary | ICD-10-CM | POA: Diagnosis not present

## 2023-02-16 DIAGNOSIS — M6281 Muscle weakness (generalized): Secondary | ICD-10-CM | POA: Diagnosis not present

## 2023-02-16 DIAGNOSIS — E785 Hyperlipidemia, unspecified: Secondary | ICD-10-CM | POA: Diagnosis not present

## 2023-02-16 DIAGNOSIS — E559 Vitamin D deficiency, unspecified: Secondary | ICD-10-CM | POA: Diagnosis not present

## 2023-02-16 DIAGNOSIS — Z79899 Other long term (current) drug therapy: Secondary | ICD-10-CM | POA: Diagnosis not present

## 2023-02-16 DIAGNOSIS — N1832 Chronic kidney disease, stage 3b: Secondary | ICD-10-CM | POA: Diagnosis not present

## 2023-02-16 DIAGNOSIS — I10 Essential (primary) hypertension: Secondary | ICD-10-CM | POA: Diagnosis not present

## 2023-02-17 DIAGNOSIS — I129 Hypertensive chronic kidney disease with stage 1 through stage 4 chronic kidney disease, or unspecified chronic kidney disease: Secondary | ICD-10-CM | POA: Diagnosis not present

## 2023-02-17 DIAGNOSIS — M79606 Pain in leg, unspecified: Secondary | ICD-10-CM | POA: Diagnosis not present

## 2023-02-17 DIAGNOSIS — N1832 Chronic kidney disease, stage 3b: Secondary | ICD-10-CM | POA: Diagnosis not present

## 2023-02-17 DIAGNOSIS — M6281 Muscle weakness (generalized): Secondary | ICD-10-CM | POA: Diagnosis not present

## 2023-02-18 DIAGNOSIS — N1832 Chronic kidney disease, stage 3b: Secondary | ICD-10-CM | POA: Diagnosis not present

## 2023-02-18 DIAGNOSIS — M6281 Muscle weakness (generalized): Secondary | ICD-10-CM | POA: Diagnosis not present

## 2023-02-18 DIAGNOSIS — M79606 Pain in leg, unspecified: Secondary | ICD-10-CM | POA: Diagnosis not present

## 2023-02-19 DIAGNOSIS — M6281 Muscle weakness (generalized): Secondary | ICD-10-CM | POA: Diagnosis not present

## 2023-02-19 DIAGNOSIS — N1832 Chronic kidney disease, stage 3b: Secondary | ICD-10-CM | POA: Diagnosis not present

## 2023-02-19 DIAGNOSIS — M79606 Pain in leg, unspecified: Secondary | ICD-10-CM | POA: Diagnosis not present

## 2023-02-22 DIAGNOSIS — N1832 Chronic kidney disease, stage 3b: Secondary | ICD-10-CM | POA: Diagnosis not present

## 2023-02-22 DIAGNOSIS — M79606 Pain in leg, unspecified: Secondary | ICD-10-CM | POA: Diagnosis not present

## 2023-02-22 DIAGNOSIS — M6281 Muscle weakness (generalized): Secondary | ICD-10-CM | POA: Diagnosis not present

## 2023-02-23 DIAGNOSIS — N1832 Chronic kidney disease, stage 3b: Secondary | ICD-10-CM | POA: Diagnosis not present

## 2023-02-23 DIAGNOSIS — M6281 Muscle weakness (generalized): Secondary | ICD-10-CM | POA: Diagnosis not present

## 2023-02-23 DIAGNOSIS — M79606 Pain in leg, unspecified: Secondary | ICD-10-CM | POA: Diagnosis not present

## 2023-02-24 DIAGNOSIS — M6281 Muscle weakness (generalized): Secondary | ICD-10-CM | POA: Diagnosis not present

## 2023-02-24 DIAGNOSIS — M79606 Pain in leg, unspecified: Secondary | ICD-10-CM | POA: Diagnosis not present

## 2023-02-24 DIAGNOSIS — N1832 Chronic kidney disease, stage 3b: Secondary | ICD-10-CM | POA: Diagnosis not present

## 2023-02-26 DIAGNOSIS — E860 Dehydration: Secondary | ICD-10-CM | POA: Diagnosis not present

## 2023-03-08 DIAGNOSIS — G629 Polyneuropathy, unspecified: Secondary | ICD-10-CM | POA: Diagnosis not present

## 2023-03-08 DIAGNOSIS — E559 Vitamin D deficiency, unspecified: Secondary | ICD-10-CM | POA: Diagnosis not present

## 2023-03-08 DIAGNOSIS — R600 Localized edema: Secondary | ICD-10-CM | POA: Diagnosis not present

## 2023-03-10 DIAGNOSIS — I2510 Atherosclerotic heart disease of native coronary artery without angina pectoris: Secondary | ICD-10-CM | POA: Diagnosis not present

## 2023-03-10 DIAGNOSIS — F32A Depression, unspecified: Secondary | ICD-10-CM | POA: Diagnosis not present

## 2023-03-10 DIAGNOSIS — E039 Hypothyroidism, unspecified: Secondary | ICD-10-CM | POA: Diagnosis not present

## 2023-03-10 DIAGNOSIS — I1310 Hypertensive heart and chronic kidney disease without heart failure, with stage 1 through stage 4 chronic kidney disease, or unspecified chronic kidney disease: Secondary | ICD-10-CM | POA: Diagnosis not present

## 2023-03-18 ENCOUNTER — Encounter (HOSPITAL_COMMUNITY): Payer: Self-pay | Admitting: Emergency Medicine

## 2023-03-18 ENCOUNTER — Emergency Department (HOSPITAL_COMMUNITY): Payer: Medicare Other

## 2023-03-18 ENCOUNTER — Inpatient Hospital Stay (HOSPITAL_COMMUNITY)
Admission: EM | Admit: 2023-03-18 | Discharge: 2023-03-23 | DRG: 871 | Disposition: A | Discharge status: Skilled Nursing Facility | Payer: Medicare Other | Source: Skilled Nursing Facility | Attending: Student | Admitting: Student

## 2023-03-18 ENCOUNTER — Other Ambulatory Visit: Payer: Self-pay

## 2023-03-18 DIAGNOSIS — J302 Other seasonal allergic rhinitis: Secondary | ICD-10-CM | POA: Diagnosis not present

## 2023-03-18 DIAGNOSIS — Z85828 Personal history of other malignant neoplasm of skin: Secondary | ICD-10-CM

## 2023-03-18 DIAGNOSIS — Z888 Allergy status to other drugs, medicaments and biological substances status: Secondary | ICD-10-CM

## 2023-03-18 DIAGNOSIS — R918 Other nonspecific abnormal finding of lung field: Secondary | ICD-10-CM | POA: Diagnosis not present

## 2023-03-18 DIAGNOSIS — Z8673 Personal history of transient ischemic attack (TIA), and cerebral infarction without residual deficits: Secondary | ICD-10-CM

## 2023-03-18 DIAGNOSIS — R32 Unspecified urinary incontinence: Secondary | ICD-10-CM | POA: Diagnosis present

## 2023-03-18 DIAGNOSIS — J9601 Acute respiratory failure with hypoxia: Secondary | ICD-10-CM

## 2023-03-18 DIAGNOSIS — I10 Essential (primary) hypertension: Secondary | ICD-10-CM | POA: Diagnosis not present

## 2023-03-18 DIAGNOSIS — I69891 Dysphagia following other cerebrovascular disease: Secondary | ICD-10-CM | POA: Diagnosis not present

## 2023-03-18 DIAGNOSIS — R339 Retention of urine, unspecified: Secondary | ICD-10-CM | POA: Diagnosis present

## 2023-03-18 DIAGNOSIS — R059 Cough, unspecified: Secondary | ICD-10-CM | POA: Diagnosis not present

## 2023-03-18 DIAGNOSIS — D649 Anemia, unspecified: Secondary | ICD-10-CM | POA: Diagnosis present

## 2023-03-18 DIAGNOSIS — Z66 Do not resuscitate: Secondary | ICD-10-CM | POA: Diagnosis present

## 2023-03-18 DIAGNOSIS — R0902 Hypoxemia: Secondary | ICD-10-CM | POA: Diagnosis not present

## 2023-03-18 DIAGNOSIS — Z6838 Body mass index (BMI) 38.0-38.9, adult: Secondary | ICD-10-CM

## 2023-03-18 DIAGNOSIS — G40909 Epilepsy, unspecified, not intractable, without status epilepticus: Secondary | ICD-10-CM | POA: Diagnosis not present

## 2023-03-18 DIAGNOSIS — R7303 Prediabetes: Secondary | ICD-10-CM | POA: Diagnosis present

## 2023-03-18 DIAGNOSIS — N183 Chronic kidney disease, stage 3 unspecified: Secondary | ICD-10-CM | POA: Diagnosis present

## 2023-03-18 DIAGNOSIS — F32A Depression, unspecified: Secondary | ICD-10-CM | POA: Diagnosis present

## 2023-03-18 DIAGNOSIS — E878 Other disorders of electrolyte and fluid balance, not elsewhere classified: Secondary | ICD-10-CM | POA: Diagnosis present

## 2023-03-18 DIAGNOSIS — Z885 Allergy status to narcotic agent status: Secondary | ICD-10-CM

## 2023-03-18 DIAGNOSIS — I890 Lymphedema, not elsewhere classified: Secondary | ICD-10-CM | POA: Diagnosis present

## 2023-03-18 DIAGNOSIS — J90 Pleural effusion, not elsewhere classified: Secondary | ICD-10-CM | POA: Diagnosis not present

## 2023-03-18 DIAGNOSIS — J984 Other disorders of lung: Secondary | ICD-10-CM | POA: Diagnosis not present

## 2023-03-18 DIAGNOSIS — F0390 Unspecified dementia without behavioral disturbance: Secondary | ICD-10-CM | POA: Diagnosis not present

## 2023-03-18 DIAGNOSIS — R6520 Severe sepsis without septic shock: Secondary | ICD-10-CM | POA: Diagnosis present

## 2023-03-18 DIAGNOSIS — E871 Hypo-osmolality and hyponatremia: Secondary | ICD-10-CM | POA: Diagnosis not present

## 2023-03-18 DIAGNOSIS — E861 Hypovolemia: Secondary | ICD-10-CM | POA: Diagnosis not present

## 2023-03-18 DIAGNOSIS — Z8249 Family history of ischemic heart disease and other diseases of the circulatory system: Secondary | ICD-10-CM

## 2023-03-18 DIAGNOSIS — Z7902 Long term (current) use of antithrombotics/antiplatelets: Secondary | ICD-10-CM

## 2023-03-18 DIAGNOSIS — K219 Gastro-esophageal reflux disease without esophagitis: Secondary | ICD-10-CM | POA: Insufficient documentation

## 2023-03-18 DIAGNOSIS — J189 Pneumonia, unspecified organism: Secondary | ICD-10-CM | POA: Diagnosis present

## 2023-03-18 DIAGNOSIS — Z7401 Bed confinement status: Secondary | ICD-10-CM | POA: Diagnosis not present

## 2023-03-18 DIAGNOSIS — F419 Anxiety disorder, unspecified: Secondary | ICD-10-CM | POA: Diagnosis not present

## 2023-03-18 DIAGNOSIS — D509 Iron deficiency anemia, unspecified: Secondary | ICD-10-CM | POA: Diagnosis not present

## 2023-03-18 DIAGNOSIS — A419 Sepsis, unspecified organism: Secondary | ICD-10-CM | POA: Diagnosis present

## 2023-03-18 DIAGNOSIS — Z809 Family history of malignant neoplasm, unspecified: Secondary | ICD-10-CM

## 2023-03-18 DIAGNOSIS — E039 Hypothyroidism, unspecified: Secondary | ICD-10-CM | POA: Diagnosis present

## 2023-03-18 DIAGNOSIS — R7989 Other specified abnormal findings of blood chemistry: Secondary | ICD-10-CM | POA: Diagnosis not present

## 2023-03-18 DIAGNOSIS — R051 Acute cough: Secondary | ICD-10-CM

## 2023-03-18 DIAGNOSIS — Z79890 Hormone replacement therapy (postmenopausal): Secondary | ICD-10-CM | POA: Diagnosis not present

## 2023-03-18 DIAGNOSIS — R0602 Shortness of breath: Secondary | ICD-10-CM | POA: Diagnosis not present

## 2023-03-18 DIAGNOSIS — E785 Hyperlipidemia, unspecified: Secondary | ICD-10-CM | POA: Diagnosis not present

## 2023-03-18 DIAGNOSIS — M1990 Unspecified osteoarthritis, unspecified site: Secondary | ICD-10-CM | POA: Diagnosis not present

## 2023-03-18 DIAGNOSIS — M6281 Muscle weakness (generalized): Secondary | ICD-10-CM | POA: Diagnosis not present

## 2023-03-18 DIAGNOSIS — J9600 Acute respiratory failure, unspecified whether with hypoxia or hypercapnia: Secondary | ICD-10-CM | POA: Diagnosis not present

## 2023-03-18 DIAGNOSIS — I129 Hypertensive chronic kidney disease with stage 1 through stage 4 chronic kidney disease, or unspecified chronic kidney disease: Secondary | ICD-10-CM | POA: Diagnosis not present

## 2023-03-18 DIAGNOSIS — R590 Localized enlarged lymph nodes: Secondary | ICD-10-CM | POA: Diagnosis not present

## 2023-03-18 DIAGNOSIS — Z79899 Other long term (current) drug therapy: Secondary | ICD-10-CM

## 2023-03-18 DIAGNOSIS — I69398 Other sequelae of cerebral infarction: Secondary | ICD-10-CM | POA: Diagnosis not present

## 2023-03-18 DIAGNOSIS — Z1152 Encounter for screening for COVID-19: Secondary | ICD-10-CM | POA: Diagnosis not present

## 2023-03-18 DIAGNOSIS — R1312 Dysphagia, oropharyngeal phase: Secondary | ICD-10-CM | POA: Diagnosis not present

## 2023-03-18 DIAGNOSIS — I3481 Nonrheumatic mitral (valve) annulus calcification: Secondary | ICD-10-CM | POA: Diagnosis not present

## 2023-03-18 DIAGNOSIS — N1832 Chronic kidney disease, stage 3b: Secondary | ICD-10-CM | POA: Diagnosis not present

## 2023-03-18 LAB — HEPATIC FUNCTION PANEL
ALT: 16 U/L (ref 0–44)
AST: 17 U/L (ref 15–41)
Albumin: 2.7 g/dL — ABNORMAL LOW (ref 3.5–5.0)
Alkaline Phosphatase: 78 U/L (ref 38–126)
Bilirubin, Direct: 0.3 mg/dL — ABNORMAL HIGH (ref 0.0–0.2)
Indirect Bilirubin: 0.4 mg/dL (ref 0.3–0.9)
Total Bilirubin: 0.7 mg/dL (ref 0.3–1.2)
Total Protein: 5.9 g/dL — ABNORMAL LOW (ref 6.5–8.1)

## 2023-03-18 LAB — BASIC METABOLIC PANEL
Anion gap: 9 (ref 5–15)
BUN: 31 mg/dL — ABNORMAL HIGH (ref 8–23)
CO2: 24 mmol/L (ref 22–32)
Calcium: 8 mg/dL — ABNORMAL LOW (ref 8.9–10.3)
Chloride: 97 mmol/L — ABNORMAL LOW (ref 98–111)
Creatinine, Ser: 1.49 mg/dL — ABNORMAL HIGH (ref 0.44–1.00)
GFR, Estimated: 34 mL/min — ABNORMAL LOW (ref >60)
Glucose, Bld: 107 mg/dL — ABNORMAL HIGH (ref 70–99)
Potassium: 3.8 mmol/L (ref 3.5–5.1)
Sodium: 130 mmol/L — ABNORMAL LOW (ref 135–145)

## 2023-03-18 LAB — APTT: aPTT: 38 seconds — ABNORMAL HIGH (ref 24–36)

## 2023-03-18 LAB — RESP PANEL BY RT-PCR (RSV, FLU A&B, COVID)  RVPGX2
Influenza A by PCR: NEGATIVE
Influenza B by PCR: NEGATIVE
Resp Syncytial Virus by PCR: NEGATIVE
SARS Coronavirus 2 by RT PCR: NEGATIVE

## 2023-03-18 LAB — BLOOD GAS, VENOUS
Acid-Base Excess: 0.7 mmol/L (ref 0.0–2.0)
Bicarbonate: 25.4 mmol/L (ref 20.0–28.0)
O2 Saturation: 96.2 %
Patient temperature: 37
pCO2, Ven: 40 mmHg — ABNORMAL LOW (ref 44–60)
pH, Ven: 7.41 (ref 7.25–7.43)
pO2, Ven: 65 mmHg — ABNORMAL HIGH (ref 32–45)

## 2023-03-18 LAB — CBC WITH DIFFERENTIAL/PLATELET
Abs Immature Granulocytes: 0.17 10*3/uL — ABNORMAL HIGH (ref 0.00–0.07)
Basophils Absolute: 0.1 10*3/uL (ref 0.0–0.1)
Basophils Relative: 0 %
Eosinophils Absolute: 0.2 10*3/uL (ref 0.0–0.5)
Eosinophils Relative: 1 %
HCT: 26.3 % — ABNORMAL LOW (ref 36.0–46.0)
Hemoglobin: 9 g/dL — ABNORMAL LOW (ref 12.0–15.0)
Immature Granulocytes: 1 %
Lymphocytes Relative: 6 %
Lymphs Abs: 0.8 10*3/uL (ref 0.7–4.0)
MCH: 32.6 pg (ref 26.0–34.0)
MCHC: 34.2 g/dL (ref 30.0–36.0)
MCV: 95.3 fL (ref 80.0–100.0)
Monocytes Absolute: 2 10*3/uL — ABNORMAL HIGH (ref 0.1–1.0)
Monocytes Relative: 15 %
Neutro Abs: 10.4 10*3/uL — ABNORMAL HIGH (ref 1.7–7.7)
Neutrophils Relative %: 77 %
Platelets: 272 10*3/uL (ref 150–400)
RBC: 2.76 MIL/uL — ABNORMAL LOW (ref 3.87–5.11)
RDW: 12.7 % (ref 11.5–15.5)
WBC: 13.6 10*3/uL — ABNORMAL HIGH (ref 4.0–10.5)
nRBC: 0 % (ref 0.0–0.2)

## 2023-03-18 LAB — LIPASE, BLOOD: Lipase: 22 U/L (ref 11–51)

## 2023-03-18 LAB — PROTIME-INR
INR: 1.3 — ABNORMAL HIGH (ref 0.8–1.2)
Prothrombin Time: 16.7 seconds — ABNORMAL HIGH (ref 11.4–15.2)

## 2023-03-18 LAB — TROPONIN I (HIGH SENSITIVITY)
Troponin I (High Sensitivity): 9 ng/L (ref <18)
Troponin I (High Sensitivity): 9 ng/L (ref <18)

## 2023-03-18 LAB — D-DIMER, QUANTITATIVE: D-Dimer, Quant: 2.37 ug{FEU}/mL — ABNORMAL HIGH (ref 0.00–0.50)

## 2023-03-18 LAB — BRAIN NATRIURETIC PEPTIDE: B Natriuretic Peptide: 84.9 pg/mL (ref 0.0–100.0)

## 2023-03-18 MED ORDER — SODIUM CHLORIDE 0.9 % IV SOLN
2.0000 g | INTRAVENOUS | Status: AC
  Administered 2023-03-18 – 2023-03-22 (×5): 2 g via INTRAVENOUS
  Filled 2023-03-18 (×5): qty 20

## 2023-03-18 MED ORDER — ESCITALOPRAM OXALATE 10 MG PO TABS
5.0000 mg | ORAL_TABLET | Freq: Every day | ORAL | Status: DC
  Administered 2023-03-18 – 2023-03-22 (×5): 5 mg via ORAL
  Filled 2023-03-18 (×6): qty 1

## 2023-03-18 MED ORDER — LORATADINE 10 MG PO TABS
10.0000 mg | ORAL_TABLET | Freq: Every day | ORAL | Status: DC
  Administered 2023-03-19 – 2023-03-22 (×4): 10 mg via ORAL
  Filled 2023-03-18 (×5): qty 1

## 2023-03-18 MED ORDER — SODIUM CHLORIDE 0.9 % IV SOLN
500.0000 mg | INTRAVENOUS | Status: DC
  Administered 2023-03-19 – 2023-03-21 (×3): 500 mg via INTRAVENOUS
  Filled 2023-03-18 (×4): qty 5

## 2023-03-18 MED ORDER — CLOPIDOGREL BISULFATE 75 MG PO TABS
75.0000 mg | ORAL_TABLET | Freq: Every evening | ORAL | Status: DC
  Administered 2023-03-18 – 2023-03-22 (×5): 75 mg via ORAL
  Filled 2023-03-18 (×5): qty 1

## 2023-03-18 MED ORDER — IOHEXOL 350 MG/ML SOLN
50.0000 mL | Freq: Once | INTRAVENOUS | Status: AC | PRN
  Administered 2023-03-18: 50 mL via INTRAVENOUS

## 2023-03-18 MED ORDER — LACTATED RINGERS IV SOLN
150.0000 mL/h | INTRAVENOUS | Status: AC
  Administered 2023-03-18 – 2023-03-19 (×2): 150 mL/h via INTRAVENOUS

## 2023-03-18 MED ORDER — VITAMIN D3 25 MCG (1000 UNIT) PO TABS
1000.0000 [IU] | ORAL_TABLET | Freq: Every day | ORAL | Status: DC
  Administered 2023-03-19 – 2023-03-22 (×4): 1000 [IU] via ORAL
  Filled 2023-03-18 (×5): qty 1

## 2023-03-18 MED ORDER — ATORVASTATIN CALCIUM 40 MG PO TABS
80.0000 mg | ORAL_TABLET | Freq: Every day | ORAL | Status: DC
  Administered 2023-03-19 – 2023-03-23 (×5): 80 mg via ORAL
  Filled 2023-03-18 (×5): qty 2

## 2023-03-18 MED ORDER — LEVOTHYROXINE SODIUM 50 MCG PO TABS
50.0000 ug | ORAL_TABLET | Freq: Every day | ORAL | Status: DC
  Administered 2023-03-19 – 2023-03-23 (×5): 50 ug via ORAL
  Filled 2023-03-18 (×5): qty 1

## 2023-03-18 MED ORDER — BENZONATATE 100 MG PO CAPS
100.0000 mg | ORAL_CAPSULE | Freq: Three times a day (TID) | ORAL | Status: DC | PRN
  Administered 2023-03-18 – 2023-03-20 (×5): 100 mg via ORAL
  Filled 2023-03-18 (×5): qty 1

## 2023-03-18 MED ORDER — POLYETHYLENE GLYCOL 3350 17 G PO PACK: 17.0000 g | PACK | Freq: Every day | ORAL | Status: DC | PRN

## 2023-03-18 MED ORDER — DILTIAZEM HCL ER COATED BEADS 180 MG PO CP24
180.0000 mg | ORAL_CAPSULE | Freq: Every day | ORAL | Status: DC
  Administered 2023-03-18 – 2023-03-22 (×5): 180 mg via ORAL
  Filled 2023-03-18 (×6): qty 1

## 2023-03-18 MED ORDER — SODIUM CHLORIDE 0.9 % IV SOLN
500.0000 mg | Freq: Once | INTRAVENOUS | Status: AC
  Administered 2023-03-18: 500 mg via INTRAVENOUS
  Filled 2023-03-18: qty 5

## 2023-03-18 MED ORDER — PANTOPRAZOLE SODIUM 40 MG PO TBEC
40.0000 mg | DELAYED_RELEASE_TABLET | Freq: Every day | ORAL | Status: DC
  Administered 2023-03-18 – 2023-03-22 (×5): 40 mg via ORAL
  Filled 2023-03-18 (×6): qty 1

## 2023-03-18 MED ORDER — SODIUM CHLORIDE 0.9 % IV SOLN
1.0000 g | Freq: Once | INTRAVENOUS | Status: AC
  Administered 2023-03-18: 1 g via INTRAVENOUS
  Filled 2023-03-18: qty 10

## 2023-03-18 MED ORDER — ENOXAPARIN SODIUM 40 MG/0.4ML IJ SOSY
40.0000 mg | PREFILLED_SYRINGE | INTRAMUSCULAR | Status: DC
  Filled 2023-03-18 (×2): qty 0.4

## 2023-03-18 MED ORDER — ACETAMINOPHEN 650 MG RE SUPP: 650.0000 mg | Freq: Four times a day (QID) | RECTAL | Status: DC | PRN

## 2023-03-18 MED ORDER — LOPERAMIDE HCL 2 MG PO CAPS: 2.0000 mg | ORAL_CAPSULE | Freq: Every day | ORAL | Status: DC | PRN

## 2023-03-18 MED ORDER — ONDANSETRON HCL 4 MG/2ML IJ SOLN: 4.0000 mg | Freq: Four times a day (QID) | INTRAMUSCULAR | Status: DC | PRN

## 2023-03-18 MED ORDER — MAGNESIUM OXIDE -MG SUPPLEMENT 400 (240 MG) MG PO TABS
400.0000 mg | ORAL_TABLET | Freq: Every day | ORAL | Status: DC
  Administered 2023-03-19 – 2023-03-20 (×2): 400 mg via ORAL
  Filled 2023-03-18 (×2): qty 1

## 2023-03-18 MED ORDER — ONDANSETRON HCL 4 MG PO TABS: 4.0000 mg | ORAL_TABLET | Freq: Four times a day (QID) | ORAL | Status: DC | PRN

## 2023-03-18 MED ORDER — HYDROCOD POLI-CHLORPHE POLI ER 10-8 MG/5ML PO SUER
5.0000 mL | Freq: Two times a day (BID) | ORAL | Status: DC | PRN
  Administered 2023-03-19 – 2023-03-20 (×3): 5 mL via ORAL
  Filled 2023-03-18 (×3): qty 5

## 2023-03-18 MED ORDER — IPRATROPIUM-ALBUTEROL 0.5-2.5 (3) MG/3ML IN SOLN
3.0000 mL | Freq: Once | RESPIRATORY_TRACT | Status: AC
  Administered 2023-03-18: 3 mL via RESPIRATORY_TRACT
  Filled 2023-03-18: qty 3

## 2023-03-18 MED ORDER — IPRATROPIUM-ALBUTEROL 0.5-2.5 (3) MG/3ML IN SOLN
3.0000 mL | Freq: Four times a day (QID) | RESPIRATORY_TRACT | Status: DC
  Administered 2023-03-18: 3 mL via RESPIRATORY_TRACT
  Filled 2023-03-18: qty 3

## 2023-03-18 MED ORDER — TAMSULOSIN HCL 0.4 MG PO CAPS
0.4000 mg | ORAL_CAPSULE | Freq: Every day | ORAL | Status: DC
  Administered 2023-03-18 – 2023-03-22 (×5): 0.4 mg via ORAL
  Filled 2023-03-18 (×6): qty 1

## 2023-03-18 MED ORDER — KRILL OIL 500 MG PO CAPS: 500.0000 mg | ORAL_CAPSULE | Freq: Every day | ORAL | Status: DC

## 2023-03-18 MED ORDER — BUPROPION HCL 75 MG PO TABS
75.0000 mg | ORAL_TABLET | Freq: Every day | ORAL | Status: DC
  Administered 2023-03-19 – 2023-03-22 (×4): 75 mg via ORAL
  Filled 2023-03-18 (×5): qty 1

## 2023-03-18 MED ORDER — CYANOCOBALAMIN 500 MCG PO TABS
500.0000 ug | ORAL_TABLET | Freq: Every day | ORAL | Status: DC
  Administered 2023-03-19 – 2023-03-22 (×4): 500 ug via ORAL
  Filled 2023-03-18 (×5): qty 1

## 2023-03-18 MED ORDER — MIRABEGRON ER 25 MG PO TB24
25.0000 mg | ORAL_TABLET | Freq: Every day | ORAL | Status: DC
  Administered 2023-03-18 – 2023-03-22 (×5): 25 mg via ORAL
  Filled 2023-03-18 (×6): qty 1

## 2023-03-18 MED ORDER — ACETAMINOPHEN 325 MG PO TABS
650.0000 mg | ORAL_TABLET | Freq: Four times a day (QID) | ORAL | Status: DC | PRN
  Administered 2023-03-18 – 2023-03-23 (×8): 650 mg via ORAL
  Filled 2023-03-18 (×8): qty 2

## 2023-03-18 MED ORDER — LISINOPRIL 10 MG PO TABS
10.0000 mg | ORAL_TABLET | Freq: Every day | ORAL | Status: DC
  Administered 2023-03-18 – 2023-03-19 (×2): 10 mg via ORAL
  Filled 2023-03-18 (×2): qty 1

## 2023-03-18 MED ORDER — SODIUM CHLORIDE (PF) 0.9 % IJ SOLN
INTRAMUSCULAR | Status: AC
  Filled 2023-03-18: qty 50

## 2023-03-18 MED ORDER — SODIUM CHLORIDE 0.9 % IV SOLN
2.0000 g | INTRAVENOUS | Status: DC
  Administered 2023-03-19: 2 g via INTRAVENOUS
  Filled 2023-03-18: qty 20

## 2023-03-18 MED ORDER — TRAZODONE HCL 50 MG PO TABS
25.0000 mg | ORAL_TABLET | Freq: Every evening | ORAL | Status: DC | PRN
  Administered 2023-03-18 – 2023-03-21 (×4): 25 mg via ORAL
  Filled 2023-03-18 (×4): qty 1

## 2023-03-18 MED ORDER — GUAIFENESIN ER 600 MG PO TB12
600.0000 mg | ORAL_TABLET | Freq: Two times a day (BID) | ORAL | Status: DC
  Administered 2023-03-18 – 2023-03-22 (×9): 600 mg via ORAL
  Filled 2023-03-18 (×10): qty 1

## 2023-03-18 MED ORDER — FERROUS SULFATE 325 (65 FE) MG PO TABS
324.0000 mg | ORAL_TABLET | ORAL | Status: DC
  Administered 2023-03-19 – 2023-03-22 (×2): 324 mg via ORAL
  Filled 2023-03-18 (×2): qty 1

## 2023-03-18 NOTE — Progress Notes (Signed)
PHARMACIST - PHYSICIAN ORDER COMMUNICATION  CONCERNING: P&T Medication Policy on Herbal Medications  DESCRIPTION:  This patient's order for:  Krill oil  has been noted.  This product(s) is classified as an "herbal" or natural product. Due to a lack of definitive safety studies or FDA approval, nonstandard manufacturing practices, plus the potential risk of unknown drug-drug interactions while on inpatient medications, the Pharmacy and Therapeutics Committee does not permit the use of "herbal" or natural products of this type within [facility]   ACTION TAKEN: The pharmacy department is unable to verify this order at this time. Please reevaluate patient's clinical condition at discharge and address if the herbal or natural product(s) should be resumed at that time.  Otho Bellows PharmD 03/18/2023, 5:26 PM

## 2023-03-18 NOTE — Assessment & Plan Note (Signed)
-   Will continue her antihypertensive therapy.

## 2023-03-18 NOTE — Assessment & Plan Note (Addendum)
-   This is clearly secondary to #1. - This is manifested by Leukocytosis and tachypnea with associated acute respiratory failure with hypoxia. - Will continue antibiotic therapy with IV Rocephin and electrolytes. - Will continue hydration with IV lactated ringer. - Will follow blood cultures.

## 2023-03-18 NOTE — Assessment & Plan Note (Signed)
-   Will continue PPI therapy. 

## 2023-03-18 NOTE — ED Provider Notes (Addendum)
[place] EMERGENCY DEPARTMENT AT [facility] Provider Note   CSN: [id number] Arrival date & time: 03/18/23  1037     History  Chief Complaint  Patient presents with   Cough    Carol Page is a 87 y.o. female.  Patient is a 87 yo female with pmh hypertension, hyperlipidemia, prediabetes, CVA, urinary incontinence presenting from [facility] presenting for cough and sob x several weeks. 88% on room air as per EMS. Does not wear home oxygen. Denies fever or chills.  Admits to bilateral lower extremity swelling and orthopnea.  Unable to answer questions regarding history of DVT or PE.  Denies chest pain.  The history is provided by the patient. No language interpreter was used.  Cough Associated symptoms: shortness of breath   Associated symptoms: no chest pain, no chills, no ear pain, no fever, no rash and no sore throat        Home Medications Prior to Admission medications   Medication Sig Start Date End Date Taking? Authorizing Provider  acetaminophen (TYLENOL) 325 MG tablet Take 650 mg by mouth every 6 (six) hours as needed for moderate pain.   Yes [provider]  acetaminophen (TYLENOL) 500 MG tablet Take 1,000 mg by mouth at bedtime.   Yes [provider]  atorvastatin (LIPITOR) 80 MG tablet Take 1 tablet by mouth once daily 09/24/20  Yes Baity, Salvadore Oxford, NP  benzonatate (TESSALON) 100 MG capsule Take 100 mg by mouth 3 (three) times daily as needed for cough.   Yes [provider]  buPROPion (WELLBUTRIN) 75 MG tablet Take 75 mg by mouth daily.   Yes [provider]  Cholecalciferol (VITAMIN D-3) 25 MCG (1000 UT) CAPS Take 1,000 Units by mouth daily.   Yes [provider]  clopidogrel (PLAVIX) 75 MG tablet Take 1 tablet by mouth in the evening 03/26/21  Yes Baity, Salvadore Oxford, NP  diltiazem (CARDIZEM CD) 180 MG 24 hr capsule Take 1 capsule by mouth once daily 10/11/20  Yes Baity, Salvadore Oxford, NP  escitalopram (LEXAPRO) 5 MG  tablet Take 5 mg by mouth daily.   Yes [provider]  ferrous sulfate 324 (65 Fe) MG TBEC Take 324 mg by mouth every Monday, Wednesday, and Friday.   Yes [provider]  Boris Lown Oil 500 MG CAPS Take 500 mg by mouth in the morning and at bedtime.   Yes [provider]  levothyroxine (SYNTHROID) 50 MCG tablet Take 50 mcg by mouth daily before breakfast.   Yes [provider]  lisinopril (ZESTRIL) 10 MG tablet Take 1 tablet (10 mg total) by mouth daily. 06/13/21  Yes Wouk, Wilfred Curtis, MD  loperamide (IMODIUM A-D) 2 MG tablet Take 2 mg by mouth daily as needed (After first loose stool as needed.).   Yes [provider]  loratadine (CLARITIN) 10 MG tablet Take 10 mg by mouth daily.   Yes [provider]  mirabegron ER (MYRBETRIQ) 25 MG TB24 tablet Take 25 mg by mouth daily.   Yes [provider]  pantoprazole (PROTONIX) 40 MG tablet Take 1 tablet by mouth once daily 05/31/21  Yes Baity, Salvadore Oxford, NP  polyethylene glycol (MIRALAX / GLYCOLAX) 17 g packet Take 17 g by mouth daily as needed for mild constipation. 01/11/21  Yes Erick Blinks, MD  tamsulosin (FLOMAX) 0.4 MG CAPS capsule Take 1 capsule (0.4 mg total) by mouth daily. 06/13/21  Yes Wouk, Wilfred Curtis, MD  vitamin B-12 (CYANOCOBALAMIN) 500 MCG tablet Take  500 mcg by mouth daily.   Yes [provider]  magnesium oxide (MAG-OX) 400 (240 Mg) MG tablet Take 400 mg by mouth daily. Patient not taking: Reported on 03/18/2023    [provider]      Allergies    Codeine and Ether    Review of Systems   Review of Systems  Constitutional:  Negative for chills and fever.  HENT:  Negative for ear pain and sore throat.   Eyes:  Negative for pain and visual disturbance.  Respiratory:  Positive for cough and shortness of breath.   Cardiovascular:  Negative for chest pain and palpitations.  Gastrointestinal:  Negative for abdominal pain and vomiting.  Genitourinary:   Negative for dysuria and hematuria.  Musculoskeletal:  Negative for arthralgias and back pain.  Skin:  Negative for color change and rash.  Neurological:  Negative for seizures and syncope.  All other systems reviewed and are negative.   Physical Exam Updated Vital Signs BP (!) 135/44   Pulse 73   Temp 98.4 F (36.9 C) (Oral)   Resp 20   Ht 5\' 4"  (1.626 m)   Wt 102.1 kg   SpO2 95%   BMI 38.62 kg/m  Physical Exam Vitals and nursing note reviewed.  Constitutional:      General: She is not in acute distress.    Appearance: She is well-developed.  HENT:     Head: Normocephalic and atraumatic.  Eyes:     Conjunctiva/sclera: Conjunctivae normal.  Cardiovascular:     Rate and Rhythm: Normal rate and regular rhythm.     Heart sounds: No murmur heard. Pulmonary:     Effort: Pulmonary effort is normal. No respiratory distress.     Breath sounds: Normal breath sounds.  Abdominal:     Palpations: Abdomen is soft.     Tenderness: There is no abdominal tenderness.  Musculoskeletal:        General: No swelling.     Cervical back: Neck supple.  Skin:    General: Skin is warm and dry.     Capillary Refill: Capillary refill takes less than 2 seconds.  Neurological:     Mental Status: She is alert.  Psychiatric:        Mood and Affect: Mood normal.     ED Results / Procedures / Treatments   Labs (all labs ordered are listed, but only abnormal results are displayed) Labs Reviewed  CBC WITH DIFFERENTIAL/PLATELET - Abnormal; Notable for the following components:      Result Value   WBC 13.6 (*)    RBC 2.76 (*)    Hemoglobin 9.0 (*)    HCT 26.3 (*)    Neutro Abs 10.4 (*)    Monocytes Absolute 2.0 (*)    Abs Immature Granulocytes 0.17 (*)    All other components within normal limits  BASIC METABOLIC PANEL - Abnormal; Notable for the following components:   Sodium 130 (*)    Chloride 97 (*)    Glucose, Bld 107 (*)    BUN 31 (*)    Creatinine, Ser 1.49 (*)    Calcium 8.0  (*)    GFR, Estimated 34 (*)    All other components within normal limits  BLOOD GAS, VENOUS - Abnormal; Notable for the following components:   pCO2, Ven 40 (*)    pO2, Ven 65 (*)    All other components within normal limits  D-DIMER, QUANTITATIVE - Abnormal; Notable for the following components:   D-Dimer, Quant  2.37 (*)    All other components within normal limits  PROTIME-INR - Abnormal; Notable for the following components:   Prothrombin Time 16.7 (*)    INR 1.3 (*)    All other components within normal limits  APTT - Abnormal; Notable for the following components:   aPTT 38 (*)    All other components within normal limits  RESP PANEL BY RT-PCR (RSV, FLU A&B, COVID)  [id number]  CULTURE, BLOOD (ROUTINE X 2)  CULTURE, BLOOD (ROUTINE X 2)  BRAIN NATRIURETIC PEPTIDE  URINALYSIS, ROUTINE W REFLEX MICROSCOPIC  HEPATIC FUNCTION PANEL  LIPASE, BLOOD  I-STAT CG4 LACTIC ACID, ED  I-STAT CG4 LACTIC ACID, ED  TROPONIN I (HIGH SENSITIVITY)  TROPONIN I (HIGH SENSITIVITY)    EKG None  Radiology CT Angio Chest PE W and/or Wo Contrast  Result Date: 03/18/2023 CLINICAL DATA:  Cough and shortness of breath EXAM: CT ANGIOGRAPHY CHEST WITH CONTRAST TECHNIQUE: Multidetector CT imaging of the chest was performed using the standard protocol during bolus administration of intravenous contrast. Multiplanar CT image reconstructions and MIPs were obtained to evaluate the vascular anatomy. RADIATION DOSE REDUCTION: This exam was performed according to the departmental dose-optimization program which includes automated exposure control, adjustment of the mA and/or kV according to patient size and/or use of iterative reconstruction technique. CONTRAST:  50mL OMNIPAQUE IOHEXOL 350 MG/ML SOLN COMPARISON:  Chest x-ray 03/18/2023 FINDINGS: Cardiovascular: Borderline size heart. No pericardial effusion. Significant calcifications along the coronary arteries and mitral valve annulus. The thoracic aorta has some  scattered vascular calcifications. There also calcifications in the area of the aortic valve. There is significant motion seen throughout the examination. This limits evaluation for pulmonary emboli, nondiagnostic for small and peripheral emboli. No segmental or larger embolus identified at this time. Some enlargement of the main pulmonary arteries. Please correlate for evidence of pulmonary artery hypertension. Mediastinum/Nodes: Patulous air-filled esophagus with a small hiatal hernia. Thyroid gland is mildly atrophic. There is no specific abnormal lymph node enlargement identified in the axillary regions. There are some enlarged hilar nodes. Example on the right on series 10 image 171 measures 18 by 13 mm. Left-sided example measures 14 by 12 mm on series 10, image 169. There are some prominent but smaller mediastinal nodes as well. Lungs/Pleura: Extensive breathing motion. Small pleural effusions. Multifocal consolidative lung opacities identified particularly along the lower lobes and dependent upper lobes. There is also some patchy areas of nodular ground-glass with interstitial septal thickening. Please correlate for an acute inflammatory or infectious process. No pneumothorax. Recommend follow-up. Upper Abdomen: Adrenal glands are preserved in the upper abdomen. There is dilated gallbladder. Please correlate with any particular symptoms and if needed further workup with ultrasound. Musculoskeletal: Osteopenia. Scattered degenerative changes of the spine with osteophytes and Schmorl's nodes. Review of the MIP images confirms the above findings. IMPRESSION: Significant breathing motion limiting evaluation. No segmental or larger pulmonary embolism. Multifocal parenchymal lung opacities with a ground-glass components and interstitial thickening. Is also small pleural effusions. Acute infiltrative process is possible such as pneumonia. Recommend follow-up to confirm clearance. Few borderline to enlarged  mediastinal and hilar lymph nodes. These could be reactive and attention on follow up as well. Dilated gallbladder in the upper abdomen. Please correlate with any symptoms and if needed further workup with ultrasound as the next step in the workup. Small hiatal hernia with patulous esophagus. Aortic Atherosclerosis (ICD10-I70.0). Electronically Signed   By: Karen Kays M.D.   On: 03/18/2023 15:10   DG Chest Portable 1  View  Result Date: 03/18/2023 CLINICAL DATA:  Cough, shortness of breath. EXAM: PORTABLE CHEST 1 VIEW COMPARISON:  June 06, 2021. FINDINGS: Stable cardiomediastinal silhouette. Probable atelectasis or scarring seen in right upper lobe and midlung. Minimal bibasilar subsegmental atelectasis or scarring is noted. Bony thorax is unremarkable. IMPRESSION: Probable atelectasis or scarring is noted in right lung as noted above. Minimal bibasilar subsegmental atelectasis or scarring. Electronically Signed   By: Lupita Raider M.D.   On: 03/18/2023 11:55    Procedures .Critical Care  Performed by: Franne Forts, DO Authorized by: Franne Forts, DO   Critical care provider statement:    Critical care time (minutes):  70   Critical care was necessary to treat or prevent imminent or life-threatening deterioration of the following conditions:  Sepsis   Critical care was time spent personally by me on the following activities:  Development of treatment plan with patient or surrogate, discussions with consultants, evaluation of patient's response to treatment, examination of patient, ordering and review of laboratory studies, ordering and review of radiographic studies, ordering and performing treatments and interventions, pulse oximetry, re-evaluation of patient's condition and review of old charts     Medications Ordered in ED Medications  ipratropium-albuterol (DUONEB) 0.5-2.5 (3) MG/3ML nebulizer solution 3 mL (3 mLs Nebulization Given 03/18/23 1144)  cefTRIAXone (ROCEPHIN) 1 g in  sodium chloride 0.9 % 100 mL IVPB (1 g Intravenous New Bag/Given 03/18/23 1350)  azithromycin (ZITHROMAX) 500 mg in sodium chloride 0.9 % 250 mL IVPB (500 mg Intravenous New Bag/Given 03/18/23 1351)  iohexol (OMNIPAQUE) 350 MG/ML injection 50 mL (50 mLs Intravenous Contrast Given 03/18/23 1428)    ED Course/ Medical Decision Making/ A&P                                 Medical Decision Making Amount and/or Complexity of Data Reviewed Labs: ordered. Radiology: ordered.  Risk Prescription drug management. Decision regarding hospitalization.   87 yo female with pmh hypertension, hyperlipidemia, prediabetes, CVA, urinary incontinence presenting from [facility] presenting for cough and sob x several weeks.  On exam patient is alert and orient x 3, no acute distress, afebrile, stable vital signs.  She was placed on 2 L nasal cannula by EMS and is currently satting at 95% oxygen and comfortable appearing.  She was originally found to be hypoxic at 88% on room air.  She does not wear home oxygen.  Patient's first bundled laboratory studies are back with leukocytosis at 13.6 with neutrophilia of 10.4.  At this time patient now qualifies for sepsis with tachypnea and leukocytosis.  Blood cultures and lactic acid ordered.  Chest x-ray demonstrates no pneumonia.  CT chest ordered.  Patient also has an elevated D-dimer.  CT chest changed to CT PE study.  Rocephin and azithromycin ordered for likely community-acquired pneumonia given patient's symptoms.  This time I recommend patient is admitted for acute respiratory failure with hypoxia with sepsis from pneumonia.  Pulmonary embolism on CT PE study.  Patient currently sating at 94% on 2 L [place]. Tachypnea present with 25 breaths/min.      Final Clinical Impression(s) / ED Diagnoses Final diagnoses:  Acute respiratory failure with hypoxia (HCC)  Acute cough  Community acquired pneumonia, unspecified laterality  Sepsis, due to unspecified organism,  unspecified whether acute organ dysfunction present [facility])    Rx / DC Orders ED Discharge Orders     None  Franne Forts, DO 03/18/23 1527    Franne Forts, DO 03/18/23 1552    Franne Forts, DO 03/18/23 [phone]

## 2023-03-18 NOTE — Plan of Care (Signed)

## 2023-03-18 NOTE — ED Notes (Signed)
ED TO INPATIENT HANDOFF REPORT  ED Nurse Name and Phone #: Richarda Osmond Name/Age/Gender Carol Page 87 y.o. female Room/Bed: WA01/WA01  Code Status   Code Status: Limited: Do not attempt resuscitation (DNR) -DNR-LIMITED -Do Not Intubate/DNI   Home/SNF/Other Skilled nursing facility Patient oriented to: self, place, time, and situation Is this baseline? Yes   Triage Complete: Triage complete  Chief Complaint CAP (community acquired pneumonia) [J18.9]  Triage Note Pt to ER via EMS from [facility] with c/o increasing cough with [facility] over last several weeks.  Pt does not usually wear O2, RA sats 88%.  Pt reports cough is congested and non-productive, pt reports that coughing "makes her want to vomit".   Allergies Allergies  Allergen Reactions   Codeine Nausea And Vomiting   Ether Nausea And Vomiting    Level of Care/Admitting Diagnosis ED Disposition     ED Disposition  Admit   Condition  --   Comment  Hospital Area: [facility] [[id number]]  Level of Care: Telemetry [5]  Admit to tele based on following criteria: Other see comments  Comments: hypoxia  May admit patient to [facility] or [facility] if equivalent level of care is available:: No  Covid Evaluation: Asymptomatic - no recent exposure (last 10 days) testing not required  Diagnosis: CAP (community acquired pneumonia) [161096]  Admitting Physician: Hannah Beat [[id number]]  Attending Physician: Hannah Beat [[id number]]  Certification:: I certify this patient will need inpatient services for at least 2 midnights  Expected Medical Readiness: 03/20/2023          B Medical/Surgery History Past Medical History:  Diagnosis Date   Allergy    Arthritis    Cellulitis of left leg    Depression    History of blood transfusion    Hyperlipidemia    Hypertension    Pre-diabetes    Proctitis    Skin cancer of nose    Stroke [facility])    Ulcer    Urinary incontinence    Past Surgical History:   Procedure Laterality Date   BILATERAL OOPHORECTOMY  06/2012   ovarian cysts   BIOPSY  01/10/2021   Procedure: BIOPSY;  Surgeon: Iva Boop, MD;  Location: [facility] ENDOSCOPY;  Service: Endoscopy;;   COLONOSCOPY WITH PROPOFOL N/A 01/10/2021   Procedure: COLONOSCOPY WITH PROPOFOL;  Surgeon: Iva Boop, MD;  Location: [facility] ENDOSCOPY;  Service: Endoscopy;  Laterality: N/A;   ESOPHAGOGASTRODUODENOSCOPY (EGD) WITH PROPOFOL N/A 01/10/2021   Procedure: ESOPHAGOGASTRODUODENOSCOPY (EGD) WITH PROPOFOL;  Surgeon: Iva Boop, MD;  Location: [facility] ENDOSCOPY;  Service: Endoscopy;  Laterality: N/A;   SKIN CANCER EXCISION  2007   reomved from nose   TONSILLECTOMY  1944   TUBAL LIGATION       A IV Location/Drains/Wounds Patient Lines/Drains/Airways Status     Active Line/Drains/Airways     Name Placement date Placement time Site Days   Peripheral IV 03/18/23 Anterior;Right Forearm 03/18/23  1305  Forearm  less than 1   Wound / Incision (Open or Dehisced) 01/08/21 (MASD) Moisture Associated Skin Damage Groin Anterior;Right;Left 01/08/21  --  Groin  799            Intake/Output Last 24 hours No intake or output data in the 24 hours ending 03/18/23 1613  Labs/Imaging Results for orders placed or performed during the hospital encounter of 03/18/23 (from the past 48 hour(s))  CBC with Differential     Status: Abnormal   Collection Time: 03/18/23 11:42 AM  Result Value Ref Range   WBC 13.6 (H) 4.0 - 10.5 K/uL   RBC 2.76 (L) 3.87 - 5.11 MIL/uL   Hemoglobin 9.0 (L) 12.0 - 15.0 g/dL   HCT 28.4 (L) 13.2 - 44.0 %   MCV 95.3 80.0 - 100.0 fL   MCH 32.6 26.0 - 34.0 pg   MCHC 34.2 30.0 - 36.0 g/dL   RDW 10.2 72.5 - 36.6 %   Platelets 272 150 - 400 K/uL   nRBC 0.0 0.0 - 0.2 %   Neutrophils Relative % 77 %   Neutro Abs 10.4 (H) 1.7 - 7.7 K/uL   Lymphocytes Relative 6 %   Lymphs Abs 0.8 0.7 - 4.0 K/uL   Monocytes Relative 15 %   Monocytes Absolute 2.0 (H) 0.1 - 1.0 K/uL   Eosinophils Relative 1  %   Eosinophils Absolute 0.2 0.0 - 0.5 K/uL   Basophils Relative 0 %   Basophils Absolute 0.1 0.0 - 0.1 K/uL   Immature Granulocytes 1 %   Abs Immature Granulocytes 0.17 (H) 0.00 - 0.07 K/uL    Comment: Performed at [facility], [address] [place], [place] [address]  Basic metabolic panel     Status: Abnormal   Collection Time: 03/18/23 11:42 AM  Result Value Ref Range   Sodium 130 (L) 135 - 145 mmol/L   Potassium 3.8 3.5 - 5.1 mmol/L   Chloride 97 (L) 98 - 111 mmol/L   CO2 24 22 - 32 mmol/L   Glucose, Bld 107 (H) 70 - 99 mg/dL    Comment: Glucose reference range applies only to samples taken after fasting for at least 8 hours.   BUN 31 (H) 8 - 23 mg/dL   Creatinine, Ser 7.42 (H) 0.44 - 1.00 mg/dL   Calcium 8.0 (L) 8.9 - 10.3 mg/dL   GFR, Estimated 34 (L) >60 mL/min    Comment: (NOTE) Calculated using the CKD-EPI Creatinine Equation (2021)    Anion gap 9 5 - 15    Comment: Performed at [facility], [address], [place], [place] [address]  Troponin I (High Sensitivity)     Status: None   Collection Time: 03/18/23 11:42 AM  Result Value Ref Range   Troponin I (High Sensitivity) 9 <18 ng/L    Comment: (NOTE) Elevated high sensitivity troponin I (hsTnI) values and significant  changes across serial measurements may suggest ACS but many other  chronic and acute conditions are known to elevate hsTnI results.  Refer to the "Links" section for chest pain algorithms and additional  guidance. Performed at [facility], [address], [place], [place] [address]   Blood gas, venous (at [facility] and [facility])     Status: Abnormal   Collection Time: 03/18/23 11:42 AM  Result Value Ref Range   pH, Ven 7.41 7.25 - 7.43   pCO2, Ven 40 (L) 44 - 60 mmHg   pO2, Ven 65 (H) 32 - 45 mmHg   Bicarbonate 25.4 20.0 - 28.0 mmol/L   Acid-Base Excess 0.7 0.0 - 2.0 mmol/L   O2 Saturation 96.2 %   Patient temperature 37.0     Comment: Performed  at [facility], [address] [place], [place] [address]  Resp panel by RT-PCR (RSV, Flu A&B, Covid) Anterior Nasal Swab     Status: None   Collection Time: 03/18/23 11:42 AM   Specimen: Anterior Nasal Swab  Result Value Ref Range   SARS Coronavirus 2 by RT  PCR NEGATIVE NEGATIVE    Comment: (NOTE) SARS-CoV-2 target nucleic acids are NOT DETECTED.  The SARS-CoV-2 RNA is generally detectable in upper respiratory specimens during the acute phase of infection. The lowest concentration of SARS-CoV-2 viral copies this assay can detect is 138 copies/mL. A negative result does not preclude SARS-Cov-2 infection and should not be used as the sole basis for treatment or other patient management decisions. A negative result may occur with  improper specimen collection/handling, submission of specimen other than nasopharyngeal swab, presence of viral mutation(s) within the areas targeted by this assay, and inadequate number of viral copies(<138 copies/mL). A negative result must be combined with clinical observations, patient history, and epidemiological information. The expected result is Negative.  Fact Sheet for Patients:  BloggerCourse.com  Fact Sheet for Healthcare Providers:  SeriousBroker.it  This test is no t yet approved or cleared by the [place] FDA and  has been authorized for detection and/or diagnosis of SARS-CoV-2 by FDA under an Emergency Use Authorization (EUA). This EUA will remain  in effect (meaning this test can be used) for the duration of the COVID-19 declaration under Section 564(b)(1) of the Act, 21 U.S.C.section 360bbb-3(b)(1), unless the authorization is terminated  or revoked sooner.       Influenza A by PCR NEGATIVE NEGATIVE   Influenza B by PCR NEGATIVE NEGATIVE    Comment: (NOTE) The Xpert Xpress SARS-CoV-2/FLU/RSV plus assay is intended as an aid in the diagnosis of influenza from  Nasopharyngeal swab specimens and should not be used as a sole basis for treatment. Nasal washings and aspirates are unacceptable for Xpert Xpress SARS-CoV-2/FLU/RSV testing.  Fact Sheet for Patients: BloggerCourse.com  Fact Sheet for Healthcare Providers: SeriousBroker.it  This test is not yet approved or cleared by the [place] FDA and has been authorized for detection and/or diagnosis of SARS-CoV-2 by FDA under an Emergency Use Authorization (EUA). This EUA will remain in effect (meaning this test can be used) for the duration of the COVID-19 declaration under Section 564(b)(1) of the Act, 21 U.S.C. section 360bbb-3(b)(1), unless the authorization is terminated or revoked.     Resp Syncytial Virus by PCR NEGATIVE NEGATIVE    Comment: (NOTE) Fact Sheet for Patients: BloggerCourse.com  Fact Sheet for Healthcare Providers: SeriousBroker.it  This test is not yet approved or cleared by the [place] FDA and has been authorized for detection and/or diagnosis of SARS-CoV-2 by FDA under an Emergency Use Authorization (EUA). This EUA will remain in effect (meaning this test can be used) for the duration of the COVID-19 declaration under Section 564(b)(1) of the Act, 21 U.S.C. section 360bbb-3(b)(1), unless the authorization is terminated or revoked.  Performed at [facility], [address], [place], [place] [address]   Brain natriuretic peptide     Status: None   Collection Time: 03/18/23 11:42 AM  Result Value Ref Range   B Natriuretic Peptide 84.9 0.0 - 100.0 pg/mL    Comment: Performed at [facility], [address] [place], [place] [address]  D-dimer, quantitative     Status: Abnormal   Collection Time: 03/18/23 11:42 AM  Result Value Ref Range   D-Dimer, Quant 2.37 (H) 0.00 - 0.50 ug/mL-FEU    Comment: (NOTE) At the  manufacturer cut-off value of 0.5 g/mL FEU, this assay has a negative predictive value of 95-100%.This assay is intended for use in conjunction with a clinical pretest probability (PTP) assessment model to exclude pulmonary embolism (PE) and deep venous thrombosis (DVT)  in outpatients suspected of PE or DVT. Results should be correlated with clinical presentation. Performed at [facility] [address], [place], [place] [address]   Troponin I (High Sensitivity)     Status: None   Collection Time: 03/18/23  1:34 PM  Result Value Ref Range   Troponin I (High Sensitivity) 9 <18 ng/L    Comment: (NOTE) Elevated high sensitivity troponin I (hsTnI) values and significant  changes across serial measurements may suggest ACS but many other  chronic and acute conditions are known to elevate hsTnI results.  Refer to the "Links" section for chest pain algorithms and additional  guidance. Performed at [facility], [address], [place], [place] [address]   Protime-INR     Status: Abnormal   Collection Time: 03/18/23  1:34 PM  Result Value Ref Range   Prothrombin Time 16.7 (H) 11.4 - 15.2 seconds   INR 1.3 (H) 0.8 - 1.2    Comment: (NOTE) INR goal varies based on device and disease states. Performed at [facility], [address] [place], [place] [address]   APTT     Status: Abnormal   Collection Time: 03/18/23  1:34 PM  Result Value Ref Range   aPTT 38 (H) 24 - 36 seconds    Comment:        IF BASELINE aPTT IS ELEVATED, SUGGEST PATIENT RISK ASSESSMENT BE USED TO DETERMINE APPROPRIATE ANTICOAGULANT THERAPY. Performed at [facility], [address], [place], [place] [address]    CT Angio Chest PE W and/or Wo Contrast  Result Date: 03/18/2023 CLINICAL DATA:  Cough and shortness of breath EXAM: CT ANGIOGRAPHY CHEST WITH CONTRAST TECHNIQUE: Multidetector CT imaging of the chest was performed using the  standard protocol during bolus administration of intravenous contrast. Multiplanar CT image reconstructions and MIPs were obtained to evaluate the vascular anatomy. RADIATION DOSE REDUCTION: This exam was performed according to the departmental dose-optimization program which includes automated exposure control, adjustment of the mA and/or kV according to patient size and/or use of iterative reconstruction technique. CONTRAST:  50mL OMNIPAQUE IOHEXOL 350 MG/ML SOLN COMPARISON:  Chest x-ray 03/18/2023 FINDINGS: Cardiovascular: Borderline size heart. No pericardial effusion. Significant calcifications along the coronary arteries and mitral valve annulus. The thoracic aorta has some scattered vascular calcifications. There also calcifications in the area of the aortic valve. There is significant motion seen throughout the examination. This limits evaluation for pulmonary emboli, nondiagnostic for small and peripheral emboli. No segmental or larger embolus identified at this time. Some enlargement of the main pulmonary arteries. Please correlate for evidence of pulmonary artery hypertension. Mediastinum/Nodes: Patulous air-filled esophagus with a small hiatal hernia. Thyroid gland is mildly atrophic. There is no specific abnormal lymph node enlargement identified in the axillary regions. There are some enlarged hilar nodes. Example on the right on series 10 image 171 measures 18 by 13 mm. Left-sided example measures 14 by 12 mm on series 10, image 169. There are some prominent but smaller mediastinal nodes as well. Lungs/Pleura: Extensive breathing motion. Small pleural effusions. Multifocal consolidative lung opacities identified particularly along the lower lobes and dependent upper lobes. There is also some patchy areas of nodular ground-glass with interstitial septal thickening. Please correlate for an acute inflammatory or infectious process. No pneumothorax. Recommend follow-up. Upper Abdomen: Adrenal glands are  preserved in the upper abdomen. There is dilated gallbladder. Please correlate with any particular symptoms and if needed further workup with ultrasound. Musculoskeletal: Osteopenia. Scattered degenerative changes  of the spine with osteophytes and Schmorl's nodes. Review of the MIP images confirms the above findings. IMPRESSION: Significant breathing motion limiting evaluation. No segmental or larger pulmonary embolism. Multifocal parenchymal lung opacities with a ground-glass components and interstitial thickening. Is also small pleural effusions. Acute infiltrative process is possible such as pneumonia. Recommend follow-up to confirm clearance. Few borderline to enlarged mediastinal and hilar lymph nodes. These could be reactive and attention on follow up as well. Dilated gallbladder in the upper abdomen. Please correlate with any symptoms and if needed further workup with ultrasound as the next step in the workup. Small hiatal hernia with patulous esophagus. Aortic Atherosclerosis (ICD10-I70.0). Electronically Signed   By: Karen Kays M.D.   On: 03/18/2023 15:10   DG Chest Portable 1 View  Result Date: 03/18/2023 CLINICAL DATA:  Cough, shortness of breath. EXAM: PORTABLE CHEST 1 VIEW COMPARISON:  June 06, 2021. FINDINGS: Stable cardiomediastinal silhouette. Probable atelectasis or scarring seen in right upper lobe and midlung. Minimal bibasilar subsegmental atelectasis or scarring is noted. Bony thorax is unremarkable. IMPRESSION: Probable atelectasis or scarring is noted in right lung as noted above. Minimal bibasilar subsegmental atelectasis or scarring. Electronically Signed   By: Lupita Raider M.D.   On: 03/18/2023 11:55    Pending Labs Unresulted Labs (From admission, onward)     Start     Ordered   03/19/23 0500  Protime-INR  Tomorrow morning,   R        03/18/23 1602   03/19/23 0500  Cortisol-am, blood  Tomorrow morning,   R        03/18/23 1602   03/19/23 0500  Procalcitonin  Tomorrow  morning,   R       References:    Procalcitonin Lower Respiratory Tract Infection AND Sepsis Procalcitonin Algorithm   03/18/23 1602   03/19/23 0500  Basic metabolic panel  Tomorrow morning,   R        03/18/23 1602   03/19/23 0500  CBC  Tomorrow morning,   R        03/18/23 1602   03/18/23 1525  Hepatic function panel  Add-on,   AD        03/18/23 1524   03/18/23 1525  Lipase, blood  Add-on,   AD        03/18/23 1524   03/18/23 1244  Blood Culture (routine x 2)  (Undifferentiated presentation (screening labs and basic nursing orders))  BLOOD CULTURE X 2,   STAT      03/18/23 1244   03/18/23 1244  Urinalysis, Routine w reflex microscopic -Urine, Clean Catch  Once,   URGENT       Question:  Specimen Source  Answer:  Urine, Clean Catch   03/18/23 1244            Vitals/Pain Today's Vitals   03/18/23 1400 03/18/23 1415 03/18/23 1511 03/18/23 1551  BP: (!) 135/44 (!) 139/125  (!) 149/55  Pulse: 73 79  81  Resp: 20 (!) 27  (!) 27  Temp:   98.4 F (36.9 C)   TempSrc:   Oral   SpO2: 95% 94%  94%  Weight:      Height:      PainSc:        Isolation Precautions No active isolations  Medications Medications  atorvastatin (LIPITOR) tablet 80 mg (has no administration in time range)  diltiazem (CARDIZEM CD) 24 hr capsule 180 mg (has no administration in time range)  lisinopril (ZESTRIL)  tablet 10 mg (has no administration in time range)  buPROPion [facility] tablet 75 mg (has no administration in time range)  escitalopram (LEXAPRO) tablet 5 mg (has no administration in time range)  levothyroxine (SYNTHROID) tablet 50 mcg (has no administration in time range)  loperamide (IMODIUM A-D) tablet 2 mg (has no administration in time range)  pantoprazole (PROTONIX) EC tablet 40 mg (has no administration in time range)  polyethylene glycol (MIRALAX / GLYCOLAX) packet 17 g (has no administration in time range)  mirabegron ER (MYRBETRIQ) tablet 25 mg (has no administration in time  range)  tamsulosin (FLOMAX) capsule 0.4 mg (has no administration in time range)  clopidogrel (PLAVIX) tablet 75 mg (has no administration in time range)  ferrous sulfate TBEC 324 mg (has no administration in time range)  vitamin B-12 (CYANOCOBALAMIN) tablet 500 mcg (has no administration in time range)  Krill Oil CAPS 500 mg (has no administration in time range)  Vitamin D-3 CAPS 1,000 Units (has no administration in time range)  magnesium oxide (MAG-OX) tablet 400 mg (has no administration in time range)  loratadine (CLARITIN) tablet 10 mg (has no administration in time range)  benzonatate (TESSALON) capsule 100 mg (has no administration in time range)  lactated ringers infusion (has no administration in time range)  enoxaparin (LOVENOX) injection 40 mg (has no administration in time range)  cefTRIAXone (ROCEPHIN) 2 g in sodium chloride 0.9 % 100 mL IVPB (has no administration in time range)  azithromycin (ZITHROMAX) 500 mg in sodium chloride 0.9 % 250 mL IVPB (has no administration in time range)  acetaminophen (TYLENOL) tablet 650 mg (has no administration in time range)    Or  acetaminophen (TYLENOL) suppository 650 mg (has no administration in time range)  traZODone (DESYREL) tablet 25 mg (has no administration in time range)  polyethylene glycol (MIRALAX / GLYCOLAX) packet 17 g (has no administration in time range)  ondansetron (ZOFRAN) tablet 4 mg (has no administration in time range)    Or  ondansetron (ZOFRAN) injection 4 mg (has no administration in time range)  ipratropium-albuterol (DUONEB) 0.5-2.5 (3) MG/3ML nebulizer solution 3 mL (has no administration in time range)  guaiFENesin (MUCINEX) 12 hr tablet 600 mg (has no administration in time range)  chlorpheniramine-HYDROcodone (TUSSIONEX) 10-8 MG/5ML suspension 5 mL (has no administration in time range)  ipratropium-albuterol (DUONEB) 0.5-2.5 (3) MG/3ML nebulizer solution 3 mL (3 mLs Nebulization Given 03/18/23 1144)   cefTRIAXone (ROCEPHIN) 1 g in sodium chloride 0.9 % 100 mL IVPB (0 g Intravenous Stopped 03/18/23 1528)  azithromycin (ZITHROMAX) 500 mg in sodium chloride 0.9 % 250 mL IVPB (0 mg Intravenous Stopped 03/18/23 1528)  iohexol (OMNIPAQUE) 350 MG/ML injection 50 mL (50 mLs Intravenous Contrast Given 03/18/23 1428)    Mobility non-ambulatory     Focused Assessments Pulmonary Assessment Handoff:  Lung sounds:   O2 Device: Nasal Cannula      R Recommendations: See Admitting Provider Note  Report given to:   Additional Notes:

## 2023-03-18 NOTE — Assessment & Plan Note (Signed)
-   The patient will be admitted to a medical telemetry bed. - Will continue antibiotic therapy with IV Rocephin and Zithromax. - Mucolytic therapy be provided as well as duo nebs q.i.d. and q.4 hours p.r.n. - We will follow blood cultures.

## 2023-03-18 NOTE — Assessment & Plan Note (Signed)
-   This is likely hypovolemic. - We will follow sodium level with hydration.

## 2023-03-18 NOTE — H&P (Signed)
[place]   PATIENT NAME: Carol Page    MR#:  [id number]  DATE OF BIRTH:  Jun 02, 1936  DATE OF ADMISSION:  03/18/2023  PRIMARY CARE PHYSICIAN: Pcp, No   Patient is coming from: [facility] living  REQUESTING/REFERRING PHYSICIAN: Edwin Dada, DO  CHIEF COMPLAINT:   Chief Complaint  Patient presents with   Cough    HISTORY OF PRESENT ILLNESS:  Carol Page is a 87 y.o. female with medical history significant for hypertension, dyslipidemia, history of anemia, prediabetes and CVA, who presents to the emergency room with a concern of worsening dyspnea with history of cough that is mainly dry over the last couple weeks as well as wheezing.  She stated that she felt cold but did not check her temperature.  No chest pain or palpitations.  She admits to nausea without vomiting.  No dysuria, dysuria or hematuria or flank pain.  No bleeding diathesis.  ED Course: When she came to the ER, BP was 144/95, respiratory rate was 20 and later 25 with pulse oximetry that was as low as 88% on room air and 94%-95 on 2 L of O2 by nasal cannula.  Labs revealed ABG with pH 7.41, pCO2 40, pO2 of 65 and O2 sat of 96.2.  On 2 L orthopedic care.  BMP revealed mild hyponatremia and hypochloremia, BUN of 31 and creatinine 1.49.  Baseline with each 3B chronic kidney disease.  Calcium was 8.  BNP was 84.9 and high sensitive troponin I was 9 twice.  CBC showed leukocytosis 15.6 and associated neutrophilia.  It also showed anemia close the previous levels.  D-dimer was 2.37 and INR 1.3 with PT 16.7 and PTT 38.  Blood Leukos was 107.  Respiratory panel including influenza RSV and COVID-19 PCR came back negative.  Blood cultures were drawn.  EKG as reviewed by me : None Imaging: Portable chest x-ray showed probable atelectasis or scarring in the right lung and minimal bibasilar subsegmental atelectasis or scarring. Chest CTA revealed the following: Significant breathing motion limiting evaluation. No segmental  or larger pulmonary embolism.   Multifocal parenchymal lung opacities with a ground-glass components and interstitial thickening. Is also small pleural effusions. Acute infiltrative process is possible such as pneumonia. Recommend follow-up to confirm clearance.   Few borderline to enlarged mediastinal and hilar lymph nodes. These could be reactive and attention on follow up as well.   Dilated gallbladder in the upper abdomen. Please correlate with any symptoms and if needed further workup with ultrasound as the next step in the workup.   Small hiatal hernia with patulous esophagus.   Aortic Atherosclerosis.  The patient was given IV Rocephin and Zithromax as well as DuoNeb.  She will be admitted to a medical telemetry bed for further evaluation and management.   PAST MEDICAL HISTORY:   Past Medical History:  Diagnosis Date   Allergy    Arthritis    Cellulitis of left leg    Depression    History of blood transfusion    Hyperlipidemia    Hypertension    Pre-diabetes    Proctitis    Skin cancer of nose    Stroke [facility])    Ulcer    Urinary incontinence     PAST SURGICAL HISTORY:   Past Surgical History:  Procedure Laterality Date   BILATERAL OOPHORECTOMY  06/2012   ovarian cysts   BIOPSY  01/10/2021   Procedure: BIOPSY;  Surgeon: Iva Boop, MD;  Location: [facility] ENDOSCOPY;  Service:  Endoscopy;;   COLONOSCOPY WITH PROPOFOL N/A 01/10/2021   Procedure: COLONOSCOPY WITH PROPOFOL;  Surgeon: Iva Boop, MD;  Location: [facility] ENDOSCOPY;  Service: Endoscopy;  Laterality: N/A;   ESOPHAGOGASTRODUODENOSCOPY (EGD) WITH PROPOFOL N/A 01/10/2021   Procedure: ESOPHAGOGASTRODUODENOSCOPY (EGD) WITH PROPOFOL;  Surgeon: Iva Boop, MD;  Location: [facility] ENDOSCOPY;  Service: Endoscopy;  Laterality: N/A;   SKIN CANCER EXCISION  2007   reomved from nose   TONSILLECTOMY  1944   TUBAL LIGATION      SOCIAL HISTORY:   Social History   Tobacco Use   Smoking status: Never    Smokeless tobacco: Never  Substance Use Topics   Alcohol use: No    FAMILY HISTORY:   Family History  Problem Relation Age of Onset   Heart disease Other        Parent   Cancer Maternal Uncle     DRUG ALLERGIES:   Allergies  Allergen Reactions   Codeine Nausea And Vomiting   Ether Nausea And Vomiting    REVIEW OF SYSTEMS:   ROS As per history of present illness. All pertinent systems were reviewed above. Constitutional, HEENT, cardiovascular, respiratory, GI, GU, musculoskeletal, neuro, psychiatric, endocrine, integumentary and hematologic systems were reviewed and are otherwise negative/unremarkable except for positive findings mentioned above in the HPI.   MEDICATIONS AT HOME:   Prior to Admission medications   Medication Sig Start Date End Date Taking? Authorizing Provider  acetaminophen (TYLENOL) 325 MG tablet Take 650 mg by mouth every 6 (six) hours as needed for moderate pain.   Yes [provider]  acetaminophen (TYLENOL) 500 MG tablet Take 1,000 mg by mouth at bedtime.   Yes [provider]  atorvastatin (LIPITOR) 80 MG tablet Take 1 tablet by mouth once daily 09/24/20  Yes Baity, Salvadore Oxford, NP  benzonatate (TESSALON) 100 MG capsule Take 100 mg by mouth 3 (three) times daily as needed for cough.   Yes [provider]  buPROPion (WELLBUTRIN) 75 MG tablet Take 75 mg by mouth daily.   Yes [provider]  Cholecalciferol (VITAMIN D-3) 25 MCG (1000 UT) CAPS Take 1,000 Units by mouth daily.   Yes [provider]  clopidogrel (PLAVIX) 75 MG tablet Take 1 tablet by mouth in the evening 03/26/21  Yes Baity, Salvadore Oxford, NP  diltiazem (CARDIZEM CD) 180 MG 24 hr capsule Take 1 capsule by mouth once daily 10/11/20  Yes Baity, Salvadore Oxford, NP  escitalopram (LEXAPRO) 5 MG tablet Take 5 mg by mouth daily.   Yes [provider]  ferrous sulfate 324 (65 Fe) MG TBEC Take 324 mg by mouth every Monday, Wednesday, and Friday.   Yes [provider]  Boris Lown Oil 500 MG CAPS Take 500 mg by mouth in the morning and at bedtime.   Yes [provider]  levothyroxine (SYNTHROID) 50 MCG tablet Take 50 mcg by mouth daily before breakfast.   Yes [provider]  lisinopril (ZESTRIL) 10 MG tablet Take 1 tablet (10 mg total) by mouth daily. 06/13/21  Yes Wouk, Wilfred Curtis, MD  loperamide (IMODIUM A-D) 2 MG tablet Take 2 mg by mouth daily as needed (After first loose stool as needed.).   Yes [provider]  loratadine (CLARITIN) 10 MG tablet Take 10 mg by mouth daily.   Yes [provider]  mirabegron ER (MYRBETRIQ) 25 MG TB24 tablet Take 25 mg by mouth daily.   Yes [provider]  pantoprazole (PROTONIX) 40 MG tablet Take 1  tablet by mouth once daily 05/31/21  Yes Baity, Salvadore Oxford, NP  polyethylene glycol (MIRALAX / GLYCOLAX) 17 g packet Take 17 g by mouth daily as needed for mild constipation. 01/11/21  Yes Erick Blinks, MD  tamsulosin (FLOMAX) 0.4 MG CAPS capsule Take 1 capsule (0.4 mg total) by mouth daily. 06/13/21  Yes Wouk, Wilfred Curtis, MD  vitamin B-12 (CYANOCOBALAMIN) 500 MCG tablet Take 500 mcg by mouth daily.   Yes [provider]  magnesium oxide (MAG-OX) 400 (240 Mg) MG tablet Take 400 mg by mouth daily. Patient not taking: Reported on 03/18/2023    [provider]      VITAL SIGNS:  Blood pressure (!) 149/55, pulse 81, temperature 98.4 F (36.9 C), temperature source Oral, resp. rate (!) 27, height 5\' 4"  (1.626 m), weight 102.1 kg, SpO2 94%.  PHYSICAL EXAMINATION:  Physical Exam  GENERAL:  87 y.o.-year-old patient lying in the bed with mild respiratory distress with conversational dyspnea EYES: Pupils equal, round, reactive to light and accommodation. No scleral icterus. Extraocular muscles intact.  HEENT: Head atraumatic, normocephalic. Oropharynx and nasopharynx clear.  NECK:  Supple, no jugular venous distention. No thyroid enlargement, no tenderness.   LUNGS: Diminished bibasilar breath sounds with bibasilar crackles.  No use of accessory muscles of respiration.  CARDIOVASCULAR: Regular rate and rhythm, S1, S2 normal. No murmurs, rubs, or gallops.  ABDOMEN: Soft, nondistended, nontender. Bowel sounds present. No organomegaly or mass.  EXTREMITIES: 1+ bilateral lower extremity pitting edema, with no cyanosis, or clubbing.  NEUROLOGIC: Cranial nerves II through XII are intact. Muscle strength 5/5 in all extremities. Sensation intact. Gait not checked.  PSYCHIATRIC: The patient is alert and oriented x 3.  Normal affect and good eye contact. SKIN: No obvious rash, lesion, or ulcer.   LABORATORY PANEL:   CBC Recent Labs  Lab 03/18/23 1142  WBC 13.6*  HGB 9.0*  HCT 26.3*  PLT 272   ------------------------------------------------------------------------------------------------------------------  Chemistries  Recent Labs  Lab 03/18/23 1142  NA 130*  K 3.8  CL 97*  CO2 24  GLUCOSE 107*  BUN 31*  CREATININE 1.49*  CALCIUM 8.0*   ------------------------------------------------------------------------------------------------------------------  Cardiac Enzymes No results for input(s): "TROPONINI" in the last 168 hours. ------------------------------------------------------------------------------------------------------------------  RADIOLOGY:  CT Angio Chest PE W and/or Wo Contrast  Result Date: 03/18/2023 CLINICAL DATA:  Cough and shortness of breath EXAM: CT ANGIOGRAPHY CHEST WITH CONTRAST TECHNIQUE: Multidetector CT imaging of the chest was performed using the standard protocol during bolus administration of intravenous contrast. Multiplanar CT image reconstructions and MIPs were obtained to evaluate the vascular anatomy. RADIATION DOSE REDUCTION: This exam was performed according to the departmental dose-optimization program which includes automated exposure control, adjustment of the mA and/or kV according to patient size  and/or use of iterative reconstruction technique. CONTRAST:  50mL OMNIPAQUE IOHEXOL 350 MG/ML SOLN COMPARISON:  Chest x-ray 03/18/2023 FINDINGS: Cardiovascular: Borderline size heart. No pericardial effusion. Significant calcifications along the coronary arteries and mitral valve annulus. The thoracic aorta has some scattered vascular calcifications. There also calcifications in the area of the aortic valve. There is significant motion seen throughout the examination. This limits evaluation for pulmonary emboli, nondiagnostic for small and peripheral emboli. No segmental or larger embolus identified at this time. Some enlargement of the main pulmonary arteries. Please correlate for evidence of pulmonary artery hypertension. Mediastinum/Nodes: Patulous air-filled esophagus with a small hiatal hernia. Thyroid gland is mildly atrophic. There is no specific abnormal lymph node enlargement identified in the axillary regions. There are  some enlarged hilar nodes. Example on the right on series 10 image 171 measures 18 by 13 mm. Left-sided example measures 14 by 12 mm on series 10, image 169. There are some prominent but smaller mediastinal nodes as well. Lungs/Pleura: Extensive breathing motion. Small pleural effusions. Multifocal consolidative lung opacities identified particularly along the lower lobes and dependent upper lobes. There is also some patchy areas of nodular ground-glass with interstitial septal thickening. Please correlate for an acute inflammatory or infectious process. No pneumothorax. Recommend follow-up. Upper Abdomen: Adrenal glands are preserved in the upper abdomen. There is dilated gallbladder. Please correlate with any particular symptoms and if needed further workup with ultrasound. Musculoskeletal: Osteopenia. Scattered degenerative changes of the spine with osteophytes and Schmorl's nodes. Review of the MIP images confirms the above findings. IMPRESSION: Significant breathing motion limiting  evaluation. No segmental or larger pulmonary embolism. Multifocal parenchymal lung opacities with a ground-glass components and interstitial thickening. Is also small pleural effusions. Acute infiltrative process is possible such as pneumonia. Recommend follow-up to confirm clearance. Few borderline to enlarged mediastinal and hilar lymph nodes. These could be reactive and attention on follow up as well. Dilated gallbladder in the upper abdomen. Please correlate with any symptoms and if needed further workup with ultrasound as the next step in the workup. Small hiatal hernia with patulous esophagus. Aortic Atherosclerosis (ICD10-I70.0). Electronically Signed   By: Karen Kays M.D.   On: 03/18/2023 15:10   DG Chest Portable 1 View  Result Date: 03/18/2023 CLINICAL DATA:  Cough, shortness of breath. EXAM: PORTABLE CHEST 1 VIEW COMPARISON:  June 06, 2021. FINDINGS: Stable cardiomediastinal silhouette. Probable atelectasis or scarring seen in right upper lobe and midlung. Minimal bibasilar subsegmental atelectasis or scarring is noted. Bony thorax is unremarkable. IMPRESSION: Probable atelectasis or scarring is noted in right lung as noted above. Minimal bibasilar subsegmental atelectasis or scarring. Electronically Signed   By: Lupita Raider M.D.   On: 03/18/2023 11:55      IMPRESSION AND PLAN:  Assessment and Plan: * CAP (community acquired pneumonia) - The patient will be admitted to a medical telemetry bed. - Will continue antibiotic therapy with IV Rocephin and Zithromax. - Mucolytic therapy be provided as well as duo nebs q.i.d. and q.4 hours p.r.n. - We will follow blood cultures.   Severe sepsis (HCC) - This is clearly secondary to #1. - This is manifested by Leukocytosis and tachypnea with associated acute respiratory failure with hypoxia. - Will continue antibiotic therapy with IV Rocephin and electrolytes. - Will continue hydration with IV lactated ringer. - Will follow blood  cultures.  Hyponatremia - This is likely hypovolemic. - We will follow sodium level with hydration.  Essential hypertension - Will continue her antihypertensive therapy.  GERD without esophagitis - Will continue PPI therapy.  Dyslipidemia - We will continue statin therapy.  Anxiety and depression - Continue Wellbutrin XL.   DVT prophylaxis: Lovenox.  Advanced Care Planning:  Code Status: The patient DNR and DNI.  This was discussed with her. Family Communication:  The plan of care was discussed in details with the patient (and family). I answered all questions. The patient agreed to proceed with the above mentioned plan. Further management will depend upon hospital course. Disposition Plan: Back to previous home environment Consults called: none.  All the records are reviewed and case discussed with ED provider.  Status is: Inpatient   At the time of the admission, it appears that the appropriate admission status for this patient is inpatient.  This is judged to be reasonable and necessary in order to provide the required intensity of service to ensure the patient's safety given the presenting symptoms, physical exam findings and initial radiographic and laboratory data in the context of comorbid conditions.  The patient requires inpatient status due to high intensity of service, high risk of further deterioration and high frequency of surveillance required.  I certify that at the time of admission, it is my clinical judgment that the patient will require inpatient hospital care extending more than 2 midnights.                            Dispo: The patient is from: Home              Anticipated d/c is to: Home              Patient currently is not medically stable to d/c.              Difficult to place patient: No  Hannah Beat M.D on 03/18/2023 at 5:02 PM  [facility]   From 7 PM-7 AM, contact night-coverage [email or web address]  CC: Primary care physician; Pcp, No

## 2023-03-18 NOTE — Assessment & Plan Note (Signed)
-   We will continue statin therapy. 

## 2023-03-18 NOTE — Assessment & Plan Note (Signed)
 Continue Wellbutrin XL

## 2023-03-18 NOTE — ED Triage Notes (Signed)
Pt to ER via EMS from [facility] with c/o increasing cough with [facility] over last several weeks.  Pt does not usually wear O2, RA sats 88%.  Pt reports cough is congested and non-productive, pt reports that coughing "makes her want to vomit".

## 2023-03-19 ENCOUNTER — Inpatient Hospital Stay (HOSPITAL_COMMUNITY): Payer: Medicare Other

## 2023-03-19 DIAGNOSIS — Z8673 Personal history of transient ischemic attack (TIA), and cerebral infarction without residual deficits: Secondary | ICD-10-CM

## 2023-03-19 DIAGNOSIS — R7303 Prediabetes: Secondary | ICD-10-CM

## 2023-03-19 DIAGNOSIS — N1832 Chronic kidney disease, stage 3b: Secondary | ICD-10-CM

## 2023-03-19 DIAGNOSIS — A419 Sepsis, unspecified organism: Secondary | ICD-10-CM | POA: Diagnosis not present

## 2023-03-19 DIAGNOSIS — E871 Hypo-osmolality and hyponatremia: Secondary | ICD-10-CM | POA: Diagnosis not present

## 2023-03-19 DIAGNOSIS — F32A Depression, unspecified: Secondary | ICD-10-CM

## 2023-03-19 DIAGNOSIS — F419 Anxiety disorder, unspecified: Secondary | ICD-10-CM

## 2023-03-19 DIAGNOSIS — J189 Pneumonia, unspecified organism: Secondary | ICD-10-CM | POA: Diagnosis not present

## 2023-03-19 DIAGNOSIS — R7989 Other specified abnormal findings of blood chemistry: Secondary | ICD-10-CM | POA: Diagnosis not present

## 2023-03-19 DIAGNOSIS — I10 Essential (primary) hypertension: Secondary | ICD-10-CM

## 2023-03-19 DIAGNOSIS — K219 Gastro-esophageal reflux disease without esophagitis: Secondary | ICD-10-CM

## 2023-03-19 DIAGNOSIS — J9601 Acute respiratory failure with hypoxia: Secondary | ICD-10-CM | POA: Diagnosis not present

## 2023-03-19 DIAGNOSIS — E785 Hyperlipidemia, unspecified: Secondary | ICD-10-CM

## 2023-03-19 LAB — BASIC METABOLIC PANEL
Anion gap: 8 (ref 5–15)
BUN: 28 mg/dL — ABNORMAL HIGH (ref 8–23)
CO2: 23 mmol/L (ref 22–32)
Calcium: 8 mg/dL — ABNORMAL LOW (ref 8.9–10.3)
Chloride: 101 mmol/L (ref 98–111)
Creatinine, Ser: 1.43 mg/dL — ABNORMAL HIGH (ref 0.44–1.00)
GFR, Estimated: 36 mL/min — ABNORMAL LOW (ref >60)
Glucose, Bld: 86 mg/dL (ref 70–99)
Potassium: 4 mmol/L (ref 3.5–5.1)
Sodium: 132 mmol/L — ABNORMAL LOW (ref 135–145)

## 2023-03-19 LAB — URINALYSIS, ROUTINE W REFLEX MICROSCOPIC
Bacteria, UA: NONE SEEN
Bilirubin Urine: NEGATIVE
Glucose, UA: NEGATIVE mg/dL
Hgb urine dipstick: NEGATIVE
Ketones, ur: NEGATIVE mg/dL
Nitrite: NEGATIVE
Protein, ur: NEGATIVE mg/dL
Specific Gravity, Urine: 1.039 — ABNORMAL HIGH (ref 1.005–1.030)
WBC, UA: >50 WBC/hpf (ref 0–5)
pH: 5 (ref 5.0–8.0)

## 2023-03-19 LAB — CBC
HCT: 26.4 % — ABNORMAL LOW (ref 36.0–46.0)
Hemoglobin: 8.5 g/dL — ABNORMAL LOW (ref 12.0–15.0)
MCH: 31.5 pg (ref 26.0–34.0)
MCHC: 32.2 g/dL (ref 30.0–36.0)
MCV: 97.8 fL (ref 80.0–100.0)
Platelets: 281 10*3/uL (ref 150–400)
RBC: 2.7 MIL/uL — ABNORMAL LOW (ref 3.87–5.11)
RDW: 12.7 % (ref 11.5–15.5)
WBC: 13.6 10*3/uL — ABNORMAL HIGH (ref 4.0–10.5)
nRBC: 0 % (ref 0.0–0.2)

## 2023-03-19 LAB — PROTIME-INR
INR: 1.2 (ref 0.8–1.2)
Prothrombin Time: 15.9 s — ABNORMAL HIGH (ref 11.4–15.2)

## 2023-03-19 LAB — CORTISOL-AM, BLOOD: Cortisol - AM: 10.1 ug/dL (ref 6.7–22.6)

## 2023-03-19 LAB — PROCALCITONIN: Procalcitonin: 0.14 ng/mL

## 2023-03-19 MED ORDER — IPRATROPIUM-ALBUTEROL 0.5-2.5 (3) MG/3ML IN SOLN
3.0000 mL | Freq: Three times a day (TID) | RESPIRATORY_TRACT | Status: DC
  Administered 2023-03-19 – 2023-03-21 (×7): 3 mL via RESPIRATORY_TRACT
  Filled 2023-03-19 (×7): qty 3

## 2023-03-19 MED ORDER — IPRATROPIUM-ALBUTEROL 0.5-2.5 (3) MG/3ML IN SOLN
3.0000 mL | Freq: Four times a day (QID) | RESPIRATORY_TRACT | Status: DC | PRN
  Administered 2023-03-21: 3 mL via RESPIRATORY_TRACT
  Filled 2023-03-19: qty 3

## 2023-03-19 NOTE — Progress Notes (Signed)
PROGRESS NOTE  Tyshika Guidry ZOX:[id number] DOB: 1935-09-18   PCP: Pcp, No  Patient is from: Home.  DOA: 03/18/2023 LOS: 1  Chief complaints Chief Complaint  Patient presents with   Cough     Brief Narrative / Interim history: 87 year old F with PMH of CVA, prediabetes, HTN, HLD, CKD-3, anxiety, depression and morbid obesity presenting with increased shortness of breath, wheezing and dry cough for about 2 weeks, and admitted due to severe sepsis with hypoxic respiratory failure in the setting of community-acquired pneumonia.  She desaturated to 88% requiring 2 L to recover to mid 90s.  ABG reassuring. Cr 1.49 (about baseline).  BNP 85.  Serial troponin negative.  WBC 15.6 with left shift.  D-dimer elevated to 2.37.  CT angio negative for PE but multifocal pneumonia and dilated gallbladder in upper abdomen.  COVID-19, influenza and RSV PCR nonreactive.  Patient was started on ceftriaxone and azithromycin and admitted for further care.    Subjective: Seen and examined earlier this morning.  No major events overnight of this morning.  Continues to report shortness of breath and cough.  Cough is nonproductive.  Denies chest pain except when she coughs.  Some abdominal discomfort but not clear pain.  Denies UTI symptoms.  Objective: Vitals:   03/19/23 0030 03/19/23 0520 03/19/23 0604 03/19/23 1222  BP: (!) 150/106 (!) 147/64  125/87  Pulse: 77 76  72  Resp: 15 14    Temp: 98.1 F (36.7 C) 97.8 F (36.6 C)  (!) 97.5 F (36.4 C)  TempSrc: Oral Oral  Oral  SpO2: 97% 94% 97% 93%  Weight:      Height:        Examination:  GENERAL: No apparent distress.  Nontoxic. HEENT: MMM.  Vision and hearing grossly intact.  NECK: Supple.  No apparent JVD.  RESP:  No IWOB.  Fair aeration bilaterally.  Frequent coughing.  Rhonchi. CVS:  RRR. Heart sounds normal.  ABD/GI/GU: BS+. Abd soft.  Diffuse tenderness all over. MSK/EXT:  Moves extremities. No apparent deformity.  1+ BLE edema. SKIN: no  apparent skin lesion or wound NEURO: Awake, alert and oriented appropriately.  No apparent focal neuro deficit. PSYCH: Calm. Normal affect.   Procedures:  None  Microbiology summarized: COVID-19, influenza and RSV PCR nonreactive Blood cultures NGTD  Assessment and plan: Principal Problem:   Severe sepsis (HCC) Active Problems:   Multifocal pneumonia   Hyponatremia   Essential hypertension   Anxiety and depression   Dyslipidemia   GERD without esophagitis   Acute respiratory failure with hypoxia (HCC)  Severe sepsis due to multifocal pneumonia: POA.  Has leukocytosis with left shift, tachypnea and respiratory failure on presentation.  Continues to endorse shortness of breath, productive cough.  CTA chest negative for PE but multifocal pneumonia.  Blood cultures NGTD -Continue CTX and Zithromax -Continue scheduled and as needed nebs  Acute respiratory failure with hypoxia: Reportedly desaturated to 88% and started on 2 L while in ED. -Manage pneumonia as above -Wean oxygen as able  Elevated D-dimer: CT angio chest negative for central PE. -Check lower extremity venous Doppler to rule out DVT  History of CVA/hyperlipidemia: No focal neuro deficit. -Continue home Plavix and statin  Normocytic anemia: H&H relatively stable Recent Labs    03/18/23 1142 03/19/23 0431  HGB 9.0* 8.5*  -Continue monitoring   CKD-3B: Stable -Continue monitoring  LUTS -Continue home Flomax and Myrbetriq  Hyponatremia: Improved. -Monitor off IV fluid   Essential hypertension: Normotensive -Continue home Cardizem and  lisinopril  Hypothyroidism -Continue home Synthroid y.   GERD without esophagitis -Continue PPI  Anxiety and depression -Continue Wellbutrin XL.  Physical deconditioning -PT/OT   Morbid obesity Body mass index is 38.62 kg/m.          DVT prophylaxis:  enoxaparin (LOVENOX) injection 40 mg Start: 03/18/23 1800  Code Status: DNR/DNI Family Communication:  None at bedside Level of care: Telemetry Status is: Inpatient Remains inpatient appropriate because: Severe sepsis due to pneumonia and respiratory failure   Final disposition: TBD Consultants:  None  55 minutes with more than 50% spent in reviewing records, counseling patient/family and coordinating care.   Sch Meds:  Scheduled Meds:  atorvastatin  80 mg Oral Daily   buPROPion  75 mg Oral Daily   cholecalciferol  1,000 Units Oral Daily   clopidogrel  75 mg Oral QPM   cyanocobalamin  500 mcg Oral Daily   diltiazem  180 mg Oral Daily   enoxaparin (LOVENOX) injection  40 mg Subcutaneous Q24H   escitalopram  5 mg Oral Daily   ferrous sulfate  324 mg Oral Q M,W,F   guaiFENesin  600 mg Oral BID   ipratropium-albuterol  3 mL Nebulization TID   levothyroxine  50 mcg Oral Q0600   lisinopril  10 mg Oral Daily   loratadine  10 mg Oral Daily   magnesium oxide  400 mg Oral Daily   mirabegron ER  25 mg Oral Daily   pantoprazole  40 mg Oral Daily   tamsulosin  0.4 mg Oral Daily   Continuous Infusions:  azithromycin 500 mg (03/19/23 1001)   cefTRIAXone (ROCEPHIN)  IV Stopped (03/18/23 2126)   cefTRIAXone (ROCEPHIN)  IV 2 g (03/19/23 0950)   PRN Meds:.acetaminophen **OR** acetaminophen, benzonatate, chlorpheniramine-HYDROcodone, ipratropium-albuterol, loperamide, ondansetron **OR** ondansetron (ZOFRAN) IV, polyethylene glycol, traZODone  Antimicrobials: Anti-infectives (From admission, onward)    Start     Dose/Rate Route Frequency Ordered Stop   03/19/23 1000  azithromycin (ZITHROMAX) 500 mg in sodium chloride 0.9 % 250 mL IVPB        500 mg 250 mL/hr over 60 Minutes Intravenous Every 24 hours 03/18/23 1602 03/24/23 0959   03/19/23 1000  cefTRIAXone (ROCEPHIN) 2 g in sodium chloride 0.9 % 100 mL IVPB        2 g 200 mL/hr over 30 Minutes Intravenous Every 24 hours 03/18/23 1633 03/24/23 0959   03/18/23 2200  cefTRIAXone (ROCEPHIN) 2 g in sodium chloride 0.9 % 100 mL IVPB        2  g 200 mL/hr over 30 Minutes Intravenous Every 24 hours 03/18/23 1602 03/23/23 2159   03/18/23 1645  cefTRIAXone (ROCEPHIN) 1 g in sodium chloride 0.9 % 100 mL IVPB        1 g 200 mL/hr over 30 Minutes Intravenous  Once 03/18/23 1632 03/18/23 1959   03/18/23 1300  cefTRIAXone (ROCEPHIN) 1 g in sodium chloride 0.9 % 100 mL IVPB        1 g 200 mL/hr over 30 Minutes Intravenous  Once 03/18/23 1246 03/18/23 1528   03/18/23 1300  azithromycin (ZITHROMAX) 500 mg in sodium chloride 0.9 % 250 mL IVPB        500 mg 250 mL/hr over 60 Minutes Intravenous  Once 03/18/23 1246 03/18/23 1528        I have personally reviewed the following labs and images: CBC: Recent Labs  Lab 03/18/23 1142 03/19/23 0431  WBC 13.6* 13.6*  NEUTROABS 10.4*  --   HGB 9.0*  8.5*  HCT 26.3* 26.4*  MCV 95.3 97.8  PLT 272 281   BMP &GFR Recent Labs  Lab 03/18/23 1142 03/19/23 0431  NA 130* 132*  K 3.8 4.0  CL 97* 101  CO2 24 23  GLUCOSE 107* 86  BUN 31* 28*  CREATININE 1.49* 1.43*  CALCIUM 8.0* 8.0*   Estimated Creatinine Clearance: 33.5 mL/min (A) (by C-G formula based on SCr of 1.43 mg/dL (H)). Liver & Pancreas: Recent Labs  Lab 03/18/23 1142  AST 17  ALT 16  ALKPHOS 78  BILITOT 0.7  PROT 5.9*  ALBUMIN 2.7*   Recent Labs  Lab 03/18/23 1142  LIPASE 22   No results for input(s): "AMMONIA" in the last 168 hours. Diabetic: No results for input(s): "HGBA1C" in the last 72 hours. No results for input(s): "GLUCAP" in the last 168 hours. Cardiac Enzymes: No results for input(s): "CKTOTAL", "CKMB", "CKMBINDEX", "TROPONINI" in the last 168 hours. No results for input(s): "PROBNP" in the last 8760 hours. Coagulation Profile: Recent Labs  Lab 03/18/23 1334 03/19/23 0431  INR 1.3* 1.2   Thyroid Function Tests: No results for input(s): "TSH", "T4TOTAL", "FREET4", "T3FREE", "THYROIDAB" in the last 72 hours. Lipid Profile: No results for input(s): "CHOL", "HDL", "LDLCALC", "TRIG", "CHOLHDL",  "LDLDIRECT" in the last 72 hours. Anemia Panel: No results for input(s): "VITAMINB12", "FOLATE", "FERRITIN", "TIBC", "IRON", "RETICCTPCT" in the last 72 hours. Urine analysis:    Component Value Date/Time   COLORURINE YELLOW 06/04/2021 1325   APPEARANCEUR CLEAR 06/04/2021 1325   LABSPEC 1.014 06/04/2021 1325   PHURINE 5.0 06/04/2021 1325   GLUCOSEU NEGATIVE 06/04/2021 1325   GLUCOSEU NEGATIVE 11/09/2012 1537   HGBUR MODERATE (A) 06/04/2021 1325   BILIRUBINUR NEGATIVE 06/04/2021 1325   KETONESUR NEGATIVE 06/04/2021 1325   PROTEINUR NEGATIVE 06/04/2021 1325   UROBILINOGEN 1.0 05/04/2015 1505   NITRITE NEGATIVE 06/04/2021 1325   LEUKOCYTESUR NEGATIVE 06/04/2021 1325   Sepsis Labs: Invalid input(s): "PROCALCITONIN", "LACTICIDVEN"  Microbiology: Recent Results (from the past 240 hour(s))  Resp panel by RT-PCR (RSV, Flu A&B, Covid) Anterior Nasal Swab     Status: None   Collection Time: 03/18/23 11:42 AM   Specimen: Anterior Nasal Swab  Result Value Ref Range Status   SARS Coronavirus 2 by RT PCR NEGATIVE NEGATIVE Final    Comment: (NOTE) SARS-CoV-2 target nucleic acids are NOT DETECTED.  The SARS-CoV-2 RNA is generally detectable in upper respiratory specimens during the acute phase of infection. The lowest concentration of SARS-CoV-2 viral copies this assay can detect is 138 copies/mL. A negative result does not preclude SARS-Cov-2 infection and should not be used as the sole basis for treatment or other patient management decisions. A negative result may occur with  improper specimen collection/handling, submission of specimen other than nasopharyngeal swab, presence of viral mutation(s) within the areas targeted by this assay, and inadequate number of viral copies(<138 copies/mL). A negative result must be combined with clinical observations, patient history, and epidemiological information. The expected result is Negative.  Fact Sheet for Patients:   BloggerCourse.com  Fact Sheet for Healthcare Providers:  SeriousBroker.it  This test is no t yet approved or cleared by the [place] FDA and  has been authorized for detection and/or diagnosis of SARS-CoV-2 by FDA under an Emergency Use Authorization (EUA). This EUA will remain  in effect (meaning this test can be used) for the duration of the COVID-19 declaration under Section 564(b)(1) of the Act, 21 U.S.C.section 360bbb-3(b)(1), unless the authorization is terminated  or revoked sooner.  Influenza A by PCR NEGATIVE NEGATIVE Final   Influenza B by PCR NEGATIVE NEGATIVE Final    Comment: (NOTE) The Xpert Xpress SARS-CoV-2/FLU/RSV plus assay is intended as an aid in the diagnosis of influenza from Nasopharyngeal swab specimens and should not be used as a sole basis for treatment. Nasal washings and aspirates are unacceptable for Xpert Xpress SARS-CoV-2/FLU/RSV testing.  Fact Sheet for Patients: BloggerCourse.com  Fact Sheet for Healthcare Providers: SeriousBroker.it  This test is not yet approved or cleared by the [place] FDA and has been authorized for detection and/or diagnosis of SARS-CoV-2 by FDA under an Emergency Use Authorization (EUA). This EUA will remain in effect (meaning this test can be used) for the duration of the COVID-19 declaration under Section 564(b)(1) of the Act, 21 U.S.C. section 360bbb-3(b)(1), unless the authorization is terminated or revoked.     Resp Syncytial Virus by PCR NEGATIVE NEGATIVE Final    Comment: (NOTE) Fact Sheet for Patients: BloggerCourse.com  Fact Sheet for Healthcare Providers: SeriousBroker.it  This test is not yet approved or cleared by the [place] FDA and has been authorized for detection and/or diagnosis of SARS-CoV-2 by FDA under an Emergency Use  Authorization (EUA). This EUA will remain in effect (meaning this test can be used) for the duration of the COVID-19 declaration under Section 564(b)(1) of the Act, 21 U.S.C. section 360bbb-3(b)(1), unless the authorization is terminated or revoked.  Performed at [facility], [address] [place], [place] [address]   Blood Culture (routine x 2)     Status: None (Preliminary result)   Collection Time: 03/18/23  1:34 PM   Specimen: BLOOD RIGHT ARM  Result Value Ref Range Status   Specimen Description   Final    BLOOD RIGHT ARM Performed at [facility], [address] [place], [place] [address]    Special Requests   Final    BOTTLES DRAWN AEROBIC AND ANAEROBIC Blood Culture adequate volume Performed at [facility], [address] [place], [place] [address]    Culture   Final    NO GROWTH < 24 HOURS Performed at [facility], [address], [place], [place] [address]    Report Status PENDING  Incomplete  Blood Culture (routine x 2)     Status: None (Preliminary result)   Collection Time: 03/18/23  1:34 PM   Specimen: BLOOD RIGHT HAND  Result Value Ref Range Status   Specimen Description   Final    BLOOD RIGHT HAND Performed at [facility], [address] [place], [place] [address]    Special Requests   Final    BOTTLES DRAWN AEROBIC AND ANAEROBIC Blood Culture adequate volume Performed at [facility], [address] [place], [place] [address]    Culture   Final    NO GROWTH < 24 HOURS Performed at [facility], [address], [place], [place] [address]    Report Status PENDING  Incomplete    Radiology Studies: VAS [place] LOWER EXTREMITY VENOUS (DVT)  Result Date: 03/19/2023  Lower Venous DVT Study Patient Name:  NELSIE BERKE  Date of Exam:   03/19/2023 Medical Rec #: [id number]       Accession #:    [id number] Date of Birth: 1935-11-19        Patient Gender: F Patient Age:   50 years  Exam Location:  [facility] Procedure:      VAS [place] LOWER EXTREMITY  VENOUS (DVT) Referring Phys: Jasmin Trumbull --------------------------------------------------------------------------------  Indications: Elevated Ddimer.  Risk Factors: None identified. Limitations: Body habitus, poor ultrasound/tissue interface and patient pain tolerance. Comparison Study: No prior studies. Performing Technologist: Chanda Busing RVT  Examination Guidelines: A complete evaluation includes B-mode imaging, spectral Doppler, color Doppler, and power Doppler as needed of all accessible portions of each vessel. Bilateral testing is considered an integral part of a complete examination. Limited examinations for reoccurring indications may be performed as noted. The reflux portion of the exam is performed with the patient in reverse Trendelenburg.  +---------+---------------+---------+-----------+----------+-------------------+ RIGHT    CompressibilityPhasicitySpontaneityPropertiesThrombus Aging      +---------+---------------+---------+-----------+----------+-------------------+ CFV      Full           Yes      Yes                                      +---------+---------------+---------+-----------+----------+-------------------+ SFJ      Full                                                             +---------+---------------+---------+-----------+----------+-------------------+ FV Prox  Full                                                             +---------+---------------+---------+-----------+----------+-------------------+ FV Mid                  Yes      Yes                                      +---------+---------------+---------+-----------+----------+-------------------+ FV Distal               Yes      Yes                                      +---------+---------------+---------+-----------+----------+-------------------+ PFV      Full                                                              +---------+---------------+---------+-----------+----------+-------------------+ POP      Full           Yes      Yes                                      +---------+---------------+---------+-----------+----------+-------------------+ PTV      Full                                                             +---------+---------------+---------+-----------+----------+-------------------+  PERO                                                  Not well visualized +---------+---------------+---------+-----------+----------+-------------------+   +---------+---------------+---------+-----------+----------+-------------------+ LEFT     CompressibilityPhasicitySpontaneityPropertiesThrombus Aging      +---------+---------------+---------+-----------+----------+-------------------+ CFV      Full           Yes      Yes                                      +---------+---------------+---------+-----------+----------+-------------------+ SFJ      Full                                                             +---------+---------------+---------+-----------+----------+-------------------+ FV Prox  Full                                                             +---------+---------------+---------+-----------+----------+-------------------+ FV Mid                  Yes      Yes                                      +---------+---------------+---------+-----------+----------+-------------------+ FV Distal               Yes      Yes                                      +---------+---------------+---------+-----------+----------+-------------------+ PFV      Full                                                             +---------+---------------+---------+-----------+----------+-------------------+ POP      Full           Yes      Yes                                       +---------+---------------+---------+-----------+----------+-------------------+ PTV      Full                                                             +---------+---------------+---------+-----------+----------+-------------------+ PERO  Not well visualized +---------+---------------+---------+-----------+----------+-------------------+    Summary: RIGHT: - There is no evidence of deep vein thrombosis in the lower extremity. However, portions of this examination were limited- see technologist comments above.  - No cystic structure found in the popliteal fossa.  LEFT: - There is no evidence of deep vein thrombosis in the lower extremity. However, portions of this examination were limited- see technologist comments above.  - No cystic structure found in the popliteal fossa.  *See table(s) above for measurements and observations.    Preliminary    CT Angio Chest PE W and/or Wo Contrast  Result Date: 03/18/2023 CLINICAL DATA:  Cough and shortness of breath EXAM: CT ANGIOGRAPHY CHEST WITH CONTRAST TECHNIQUE: Multidetector CT imaging of the chest was performed using the standard protocol during bolus administration of intravenous contrast. Multiplanar CT image reconstructions and MIPs were obtained to evaluate the vascular anatomy. RADIATION DOSE REDUCTION: This exam was performed according to the departmental dose-optimization program which includes automated exposure control, adjustment of the mA and/or kV according to patient size and/or use of iterative reconstruction technique. CONTRAST:  50mL OMNIPAQUE IOHEXOL 350 MG/ML SOLN COMPARISON:  Chest x-ray 03/18/2023 FINDINGS: Cardiovascular: Borderline size heart. No pericardial effusion. Significant calcifications along the coronary arteries and mitral valve annulus. The thoracic aorta has some scattered vascular calcifications. There also calcifications in the area of the aortic valve. There is  significant motion seen throughout the examination. This limits evaluation for pulmonary emboli, nondiagnostic for small and peripheral emboli. No segmental or larger embolus identified at this time. Some enlargement of the main pulmonary arteries. Please correlate for evidence of pulmonary artery hypertension. Mediastinum/Nodes: Patulous air-filled esophagus with a small hiatal hernia. Thyroid gland is mildly atrophic. There is no specific abnormal lymph node enlargement identified in the axillary regions. There are some enlarged hilar nodes. Example on the right on series 10 image 171 measures 18 by 13 mm. Left-sided example measures 14 by 12 mm on series 10, image 169. There are some prominent but smaller mediastinal nodes as well. Lungs/Pleura: Extensive breathing motion. Small pleural effusions. Multifocal consolidative lung opacities identified particularly along the lower lobes and dependent upper lobes. There is also some patchy areas of nodular ground-glass with interstitial septal thickening. Please correlate for an acute inflammatory or infectious process. No pneumothorax. Recommend follow-up. Upper Abdomen: Adrenal glands are preserved in the upper abdomen. There is dilated gallbladder. Please correlate with any particular symptoms and if needed further workup with ultrasound. Musculoskeletal: Osteopenia. Scattered degenerative changes of the spine with osteophytes and Schmorl's nodes. Review of the MIP images confirms the above findings. IMPRESSION: Significant breathing motion limiting evaluation. No segmental or larger pulmonary embolism. Multifocal parenchymal lung opacities with a ground-glass components and interstitial thickening. Is also small pleural effusions. Acute infiltrative process is possible such as pneumonia. Recommend follow-up to confirm clearance. Few borderline to enlarged mediastinal and hilar lymph nodes. These could be reactive and attention on follow up as well. Dilated  gallbladder in the upper abdomen. Please correlate with any symptoms and if needed further workup with ultrasound as the next step in the workup. Small hiatal hernia with patulous esophagus. Aortic Atherosclerosis (ICD10-I70.0). Electronically Signed   By: Karen Kays M.D.   On: 03/18/2023 15:10      Maddy Graham T. Jeevan Kalla [facility]  If 7PM-7AM, please contact night-coverage [email or web address] 03/19/2023, 1:01 PM

## 2023-03-19 NOTE — Evaluation (Signed)
Physical Therapy Evaluation Patient Details Name: Carol Page MRN: [id number] DOB: 09/06/1935 Today's Date: 03/19/2023  History of Present Illness  87 yo female presents to therapy following hospital admission on 03/18/2023 due to progressive SOB, dry cough and wheezing. Pt was hypoxic upon arrival to the ED 88% on RA, noted to have abn labs and imaging revealed PNA. Pt dx with sepsis secondary to PNA with acute respiratory failure with hypoxia. Pt PMH includes but is not limited to: HTN, HLD, anemia, and CVA.  Clinical Impression     Pt admitted with above diagnosis.  Pt currently with functional limitations due to the deficits listed below (see PT Problem List).  Pt in bed when PT arrived. Pt required max cues and encouragement for minimal active participation with PT eval. Pt reported being tired and B LE pain. Pt presented with B LE edema. Pt required mod A for supine to sit, max A for sit to supine and for rolling side to side and total assist to reposition in bed. Pt able to maintain sitting balance EOB with pt declining to place B LE on floor and use of one UE support with CGA and progressing to close S. Pt is a poor historian and unable to provide insight to PLOF with pt residing at [facility]. Pt left in bed and all needs in place. Pt will benefit from acute skilled PT to increase their independence and safety with mobility to allow discharge.          If plan is discharge home, recommend the following: Two people to help with walking and/or transfers;A lot of help with bathing/dressing/bathroom;Assistance with cooking/housework;Assist for transportation;Help with stairs or ramp for entrance   Can travel by private vehicle   No    Equipment Recommendations None recommended by PT  Recommendations for Other Services       Functional Status Assessment Patient has had a recent decline in their functional status and demonstrates the ability to make significant improvements in  function in a reasonable and predictable amount of time.     Precautions / Restrictions Precautions Precautions: Fall Restrictions Weight Bearing Restrictions: No      Mobility  Bed Mobility Overal bed mobility: Needs Assistance Bed Mobility: Supine to Sit, Sit to Supine, Rolling Rolling: Max assist, Used rails   Supine to sit: Mod assist, HOB elevated, Used rails Sit to supine: Max assist, Used rails   General bed mobility comments: pt requried increased time, max cues for encouragement for supine to sit EOB, max A to scoot toward EOB and pt resisitive to have B LE on floor reporting she was going to fall, pt indicated she hurt and she was tired and repeatedly requested to return to bed    Transfers                   General transfer comment: pt declined trial with sit to stand    Ambulation/Gait               General Gait Details: NT  Stairs            Wheelchair Mobility     Tilt Bed    Modified Rankin (Stroke Patients Only)       Balance Overall balance assessment: History of Falls, Needs assistance Sitting-balance support: Single extremity supported, Feet unsupported Sitting balance-Leahy Scale: Fair Sitting balance - Comments: pt able to maintain sitting balance EOB with CGA to close S, pt reported she was falling and required  cues for attention to midline and upright posture with fatigue pt exhibited posterior and L lateral lean                                     Pertinent Vitals/Pain Pain Assessment Pain Assessment: Faces Faces Pain Scale: Hurts even more Pain Location: B LE Pain Descriptors / Indicators: Aching, Discomfort, Moaning, Tender Pain Intervention(s): Limited activity within patient's tolerance, Monitored during session, Repositioned    Home Living Family/patient expects to be discharged to:: Skilled nursing facility                        Prior Function Prior Level of Function : Needs  assist;History of Falls (last six months);Patient poor historian/Family not available       Physical Assist : Mobility (physical);ADLs (physical)     Mobility Comments: pt is LCT resident at [facility]. pt indicates she spends most of her time in bed. pt states has wc and RW pt unable to relay required assist for transfers ADLs Comments: pt unable to communicate required assist for ADLs or self care tasks     Extremity/Trunk Assessment        Lower Extremity Assessment Lower Extremity Assessment: Generalized weakness    Cervical / Trunk Assessment Cervical / Trunk Assessment:  (head forward)  Communication   Communication Communication: No apparent difficulties  Cognition Arousal: Alert Behavior During Therapy: Agitated Overall Cognitive Status: No family/caregiver present to determine baseline cognitive functioning                                 General Comments: pt appears to be oriented to self and location. pt unable to provide insight to PLOF and exhibited self limiting behaviors and reported fear of falling        General Comments General comments (skin integrity, edema, etc.): B LE edema noted    Exercises     Assessment/Plan    PT Assessment Patient needs continued PT services  PT Problem List Decreased strength;Decreased activity tolerance;Decreased balance;Decreased mobility;Decreased coordination;Pain       PT Treatment Interventions DME instruction;Gait training;Functional mobility training;Therapeutic activities;Therapeutic exercise;Balance training;Neuromuscular re-education;Patient/family education    PT Goals (Current goals can be found in the Care Plan section)  Acute Rehab PT Goals Patient Stated Goal: to get back to bed PT Goal Formulation: With patient Time For Goal Achievement: 04/02/23 Potential to Achieve Goals: Good    Frequency Min 1X/week     Co-evaluation               AM-PAC PT "6 Clicks" Mobility   Outcome Measure Help needed turning from your back to your side while in a flat bed without using bedrails?: A Lot Help needed moving from lying on your back to sitting on the side of a flat bed without using bedrails?: A Lot Help needed moving to and from a bed to a chair (including a wheelchair)?: Total Help needed standing up from a chair using your arms (e.g., wheelchair or bedside chair)?: Total Help needed to walk in hospital room?: Total Help needed climbing 3-5 steps with a railing? : Total 6 Click Score: 8    End of Session Equipment Utilized During Treatment: Oxygen Activity Tolerance: Patient limited by pain;Patient limited by fatigue Patient left: in bed;with call bell/phone within reach;with nursing/sitter in  room Nurse Communication: Mobility status;Need for lift equipment PT Visit Diagnosis: Unsteadiness on feet (R26.81);Other abnormalities of gait and mobility (R26.89);Muscle weakness (generalized) (M62.81);History of falling (Z91.81);Difficulty in walking, not elsewhere classified (R26.2);Pain Pain - Right/Left:  (B) Pain - part of body: Knee;Leg;Ankle and joints of foot    Time: 6578-4696 PT Time Calculation (min) (ACUTE ONLY): 24 min   Charges:   PT Evaluation $PT Eval Low Complexity: 1 Low PT Treatments $Therapeutic Activity: 8-22 mins PT General Charges $$ ACUTE PT VISIT: 1 Visit         Johnny Bridge, PT Acute Rehab   Jacqualyn Posey 03/19/2023, 12:42 PM

## 2023-03-19 NOTE — TOC Initial Note (Signed)
Transition of Care [facility]) - Initial/Assessment Note    Patient Details  Name: Carol Page MRN: [id number] Date of Birth: July 19, 1936  [facility] CM/SW Contact:    Larrie Kass, LCSW Phone Number: 03/19/2023, 12:52 PM  Clinical Narrative:                 Pt is a LTC resident at [facility], can return back at time of d/c. TOC to follow.   Expected Discharge Plan:  (TBD) Barriers to Discharge: Continued Medical Work up   Patient Goals and CMS Choice            Expected Discharge Plan and Services                                              Prior Living Arrangements/Services                       Activities of Daily Living   ADL Screening (condition at time of admission) Patient's cognitive ability adequate to safely complete daily activities?: No Is the patient deaf or have difficulty hearing?: No Does the patient have difficulty seeing, even when wearing glasses/contacts?: No Does the patient have difficulty concentrating, remembering, or making decisions?: No Patient able to express need for assistance with ADLs?: Yes Does the patient have difficulty dressing or bathing?: Yes Independently performs ADLs?: No Dressing (OT): Needs assistance Is this a change from baseline?: Change from baseline, expected to last <3days Does the patient have difficulty walking or climbing stairs?: Yes Weakness of Legs: None Weakness of Arms/Hands: Both  Permission Sought/Granted                  Emotional Assessment              Admission diagnosis:  CAP (community acquired pneumonia) [J18.9] Acute respiratory failure with hypoxia (HCC) [J96.01] Community acquired pneumonia, unspecified laterality [J18.9] Sepsis, due to unspecified organism, unspecified whether acute organ dysfunction present (HCC) [A41.9] Acute cough [R05.1] Patient Active Problem List   Diagnosis Date Noted   CAP (community acquired pneumonia) 03/18/2023    Severe sepsis (HCC) 03/18/2023   Hyponatremia 03/18/2023   Dyslipidemia 03/18/2023   GERD without esophagitis 03/18/2023   Acute respiratory failure with hypoxia (HCC) 03/18/2023   AKI (acute kidney injury) (HCC) 06/04/2021   Hyperkalemia    CKD (chronic kidney disease) stage 4, GFR 15-29 ml/min (HCC) 03/26/2021   Iron deficiency anemia 02/18/2021   Morbid obesity (HCC) 01/17/2021   Aortic atherosclerosis (HCC) 01/17/2021   CKD (chronic kidney disease) stage 3, GFR 30-59 ml/min (HCC) 01/08/2021   Obesity, Class III, BMI 40-49.9 (morbid obesity) (HCC) 01/08/2021   Anxiety and depression 10/06/2013   Urinary incontinence 10/06/2013   Chronic leg pain 10/06/2013   History of CVA (cerebrovascular accident) 10/06/2013   Essential hypertension 11/09/2012   HLD (hyperlipidemia) 11/09/2012   Pre-diabetes 11/09/2012   Seizure disorder (HCC) 11/09/2012   PCP:  Oneita Hurt No Pharmacy:   [id number] [address] [place] ([place]),  - [address]  ([place]) [place] [address] Phone: [phone] Fax: [phone]     Social Determinants of Health (SDOH) Social History: SDOH Screenings   Food Insecurity: No Food Insecurity (03/18/2023)  Housing: Low Risk  (03/18/2023)  Transportation Needs: No Transportation Needs (03/18/2023)  Utilities: Not At Risk (03/18/2023)  Alcohol Screen: Low Risk  (01/16/2021)  Depression (PHQ2-9): High Risk (04/30/2021)  Tobacco Use: Low Risk  (03/18/2023)   SDOH Interventions:     Readmission Risk Interventions     No data to display

## 2023-03-19 NOTE — Progress Notes (Signed)
Bilateral lower extremity venous duplex has been completed. Preliminary results can be found in CV Proc through chart review.   03/19/23 10:01 AM Olen Cordial RVT

## 2023-03-20 DIAGNOSIS — J189 Pneumonia, unspecified organism: Secondary | ICD-10-CM | POA: Diagnosis not present

## 2023-03-20 DIAGNOSIS — A419 Sepsis, unspecified organism: Secondary | ICD-10-CM | POA: Diagnosis not present

## 2023-03-20 DIAGNOSIS — E871 Hypo-osmolality and hyponatremia: Secondary | ICD-10-CM | POA: Diagnosis not present

## 2023-03-20 DIAGNOSIS — J9601 Acute respiratory failure with hypoxia: Secondary | ICD-10-CM | POA: Diagnosis not present

## 2023-03-20 LAB — CBC
HCT: 28.8 % — ABNORMAL LOW (ref 36.0–46.0)
Hemoglobin: 9 g/dL — ABNORMAL LOW (ref 12.0–15.0)
MCH: 31.1 pg (ref 26.0–34.0)
MCHC: 31.3 g/dL (ref 30.0–36.0)
MCV: 99.7 fL (ref 80.0–100.0)
Platelets: 296 10*3/uL (ref 150–400)
RBC: 2.89 MIL/uL — ABNORMAL LOW (ref 3.87–5.11)
RDW: 13 % (ref 11.5–15.5)
WBC: 14.5 10*3/uL — ABNORMAL HIGH (ref 4.0–10.5)
nRBC: 0 % (ref 0.0–0.2)

## 2023-03-20 LAB — RENAL FUNCTION PANEL
Albumin: 2.6 g/dL — ABNORMAL LOW (ref 3.5–5.0)
Anion gap: 11 (ref 5–15)
BUN: 30 mg/dL — ABNORMAL HIGH (ref 8–23)
CO2: 23 mmol/L (ref 22–32)
Calcium: 8.6 mg/dL — ABNORMAL LOW (ref 8.9–10.3)
Chloride: 100 mmol/L (ref 98–111)
Creatinine, Ser: 1.69 mg/dL — ABNORMAL HIGH (ref 0.44–1.00)
GFR, Estimated: 29 mL/min — ABNORMAL LOW (ref >60)
Glucose, Bld: 72 mg/dL (ref 70–99)
Phosphorus: 3.8 mg/dL (ref 2.5–4.6)
Potassium: 4.3 mmol/L (ref 3.5–5.1)
Sodium: 134 mmol/L — ABNORMAL LOW (ref 135–145)

## 2023-03-20 LAB — MAGNESIUM: Magnesium: 1.8 mg/dL (ref 1.7–2.4)

## 2023-03-20 MED ORDER — ENOXAPARIN SODIUM 30 MG/0.3ML IJ SOSY
30.0000 mg | PREFILLED_SYRINGE | INTRAMUSCULAR | Status: DC
  Filled 2023-03-20: qty 0.3

## 2023-03-20 MED ORDER — GUAIFENESIN 100 MG/5ML PO LIQD
5.0000 mL | ORAL | Status: DC | PRN
  Administered 2023-03-20 – 2023-03-22 (×6): 5 mL via ORAL
  Filled 2023-03-20 (×6): qty 10

## 2023-03-20 NOTE — Progress Notes (Signed)
PROGRESS NOTE  Carol Page HQI:[id number] DOB: 03/17/1936   PCP: Pcp, No  Patient is from: LTC at [facility]  DOA: 03/18/2023 LOS: 2  Chief complaints Chief Complaint  Patient presents with   Cough     Brief Narrative / Interim history: 87 year old F with PMH of CVA, prediabetes, HTN, HLD, CKD-3, anxiety, depression and morbid obesity presenting with increased shortness of breath, wheezing and dry cough for about 2 weeks, and admitted due to severe sepsis with hypoxic respiratory failure in the setting of community-acquired pneumonia.  She desaturated to 88% requiring 2 L to recover to mid 90s.  ABG reassuring. Cr 1.49 (about baseline).  BNP 87.  Serial troponin negative.  WBC 15.6 with left shift.  D-dimer elevated to 2.37.  CT angio negative for PE but multifocal pneumonia and dilated gallbladder in upper abdomen.  COVID-19, influenza and RSV PCR nonreactive.  Patient was started on ceftriaxone and azithromycin and admitted for further care.  Slowly improving.  Remains on IV ceftriaxone and Zithromax.    Subjective: Seen and examined this afternoon.  No major events overnight of this morning.  Feels better.  Denies shortness of breath.  Cough has improved.  Chest feels sore with cough.  Denies GI or UTI symptoms.  Objective: Vitals:   03/19/23 2117 03/20/23 0455 03/20/23 0845 03/20/23 1249  BP:  118/84  (!) 94/52  Pulse:  94  87  Resp:  18  18  Temp:  98 F (36.7 C)  98 F (36.7 C)  TempSrc:  Oral  Oral  SpO2: 94% 93% 94% 95%  Weight:      Height:        Examination:  GENERAL: No apparent distress.  Nontoxic. HEENT: MMM.  Vision and hearing grossly intact.  NECK: Supple.  No apparent JVD.  RESP:  No IWOB.  Fair aeration bilaterally. Rhonchi. CVS:  RRR. Heart sounds normal.  ABD/GI/GU: BS+. Abd soft.  Diffuse tenderness all over. MSK/EXT:  Moves extremities. No apparent deformity.  1-2 BLE edema. SKIN: no apparent skin lesion or wound NEURO: Awake, alert and  oriented appropriately.  No apparent focal neuro deficit. PSYCH: Calm. Normal affect.   Procedures:  None  Microbiology summarized: COVID-19, influenza and RSV PCR nonreactive Blood cultures NGTD  Assessment and plan: Principal Problem:   Severe sepsis (HCC) Active Problems:   Multifocal pneumonia   Hyponatremia   Essential hypertension   Pre-diabetes   Anxiety and depression   History of CVA (cerebrovascular accident)   CKD (chronic kidney disease) stage 3, GFR 30-59 ml/min (HCC)   Morbid obesity (HCC)   Dyslipidemia   GERD without esophagitis   Acute respiratory failure with hypoxia (HCC)  Severe sepsis due to multifocal pneumonia: POA.  Has leukocytosis with left shift, tachypnea and respiratory failure on presentation.  CTA chest negative for PE but multifocal pneumonia.  Blood cultures NGTD.  Symptoms improving but persistent mild leukocytosis. -Continue CTX and Zithromax -Continue scheduled and as needed nebs -Mucolytic's and antitussive  Acute respiratory failure with hypoxia: Reportedly desaturated to 88% and started on 2 L while in ED. -Manage pneumonia as above -Wean oxygen as able -Incentive spirometry, OOB/PT/OT  Elevated D-dimer: CT angio chest negative for central PE.  LE venous Doppler negative for DVT -Continue VTE prophylaxis  History of CVA/hyperlipidemia: No focal neuro deficit. -Continue home Plavix and statin  Normocytic anemia: H&H relatively stable Recent Labs    03/18/23 1142 03/19/23 0431 03/20/23 0451  HGB 9.0* 8.5* 9.0*  -Continue monitoring  CKD-3B: Cr slightly up. Recent Labs    03/18/23 1142 03/19/23 0431 03/20/23 0451  BUN 31* 28* 30*  CREATININE 1.49* 1.43* 1.69*  -Discontinue lisinopril -Continue monitoring  LUTS/history of urinary incontinence -Continue home Flomax and Myrbetriq  Hyponatremia: Improved. -Monitor off IV fluid   Essential hypertension: Normotensive -Continue home Cardizem and  lisinopril  Hypothyroidism -Continue home Synthroid.   GERD without esophagitis -Continue PPI  Anxiety and depression -Continue Wellbutrin XL.  Physical deconditioning -PT/OT   Morbid obesity Body mass index is 38.62 kg/m.          DVT prophylaxis:  enoxaparin (LOVENOX) injection 40 mg Start: 03/18/23 1800  Code Status: DNR/DNI Family Communication: None at bedside Level of care: Med-Surg Status is: Inpatient Remains inpatient appropriate because: Severe sepsis due to pneumonia and respiratory failure   Final disposition: SNF Consultants:  None  35 minutes with more than 50% spent in reviewing records, counseling patient/family and coordinating care.   Sch Meds:  Scheduled Meds:  atorvastatin  80 mg Oral Daily   buPROPion  75 mg Oral Daily   cholecalciferol  1,000 Units Oral Daily   clopidogrel  75 mg Oral QPM   cyanocobalamin  500 mcg Oral Daily   diltiazem  180 mg Oral Daily   enoxaparin (LOVENOX) injection  40 mg Subcutaneous Q24H   escitalopram  5 mg Oral Daily   ferrous sulfate  324 mg Oral Q M,W,F   guaiFENesin  600 mg Oral BID   ipratropium-albuterol  3 mL Nebulization TID   levothyroxine  50 mcg Oral Q0600   loratadine  10 mg Oral Daily   magnesium oxide  400 mg Oral Daily   mirabegron ER  25 mg Oral Daily   pantoprazole  40 mg Oral Daily   tamsulosin  0.4 mg Oral Daily   Continuous Infusions:  azithromycin 500 mg (03/20/23 1049)   cefTRIAXone (ROCEPHIN)  IV 2 g (03/19/23 2116)   PRN Meds:.acetaminophen **OR** acetaminophen, benzonatate, chlorpheniramine-HYDROcodone, ipratropium-albuterol, loperamide, ondansetron **OR** ondansetron (ZOFRAN) IV, polyethylene glycol, traZODone  Antimicrobials: Anti-infectives (From admission, onward)    Start     Dose/Rate Route Frequency Ordered Stop   03/19/23 1000  azithromycin (ZITHROMAX) 500 mg in sodium chloride 0.9 % 250 mL IVPB        500 mg 250 mL/hr over 60 Minutes Intravenous Every 24 hours  03/18/23 1602 03/24/23 0959   03/19/23 1000  cefTRIAXone (ROCEPHIN) 2 g in sodium chloride 0.9 % 100 mL IVPB  Status:  Discontinued        2 g 200 mL/hr over 30 Minutes Intravenous Every 24 hours 03/18/23 1633 03/20/23 0809   03/18/23 2200  cefTRIAXone (ROCEPHIN) 2 g in sodium chloride 0.9 % 100 mL IVPB        2 g 200 mL/hr over 30 Minutes Intravenous Every 24 hours 03/18/23 1602 03/23/23 2159   03/18/23 1645  cefTRIAXone (ROCEPHIN) 1 g in sodium chloride 0.9 % 100 mL IVPB        1 g 200 mL/hr over 30 Minutes Intravenous  Once 03/18/23 1632 03/18/23 1959   03/18/23 1300  cefTRIAXone (ROCEPHIN) 1 g in sodium chloride 0.9 % 100 mL IVPB        1 g 200 mL/hr over 30 Minutes Intravenous  Once 03/18/23 1246 03/18/23 1528   03/18/23 1300  azithromycin (ZITHROMAX) 500 mg in sodium chloride 0.9 % 250 mL IVPB        500 mg 250 mL/hr over 60 Minutes Intravenous  Once  03/18/23 1246 03/18/23 1528        I have personally reviewed the following labs and images: CBC: Recent Labs  Lab 03/18/23 1142 03/19/23 0431 03/20/23 0451  WBC 13.6* 13.6* 14.5*  NEUTROABS 10.4*  --   --   HGB 9.0* 8.5* 9.0*  HCT 26.3* 26.4* 28.8*  MCV 95.3 97.8 99.7  PLT 272 281 296   BMP &GFR Recent Labs  Lab 03/18/23 1142 03/19/23 0431 03/20/23 0451  NA 130* 132* 134*  K 3.8 4.0 4.3  CL 97* 101 100  CO2 24 23 23   GLUCOSE 107* 86 72  BUN 31* 28* 30*  CREATININE 1.49* 1.43* 1.69*  CALCIUM 8.0* 8.0* 8.6*  MG  --   --  1.8  PHOS  --   --  3.8   Estimated Creatinine Clearance: 28.3 mL/min (A) (by C-G formula based on SCr of 1.69 mg/dL (H)). Liver & Pancreas: Recent Labs  Lab 03/18/23 1142 03/20/23 0451  AST 17  --   ALT 16  --   ALKPHOS 78  --   BILITOT 0.7  --   PROT 5.9*  --   ALBUMIN 2.7* 2.6*   Recent Labs  Lab 03/18/23 1142  LIPASE 22   No results for input(s): "AMMONIA" in the last 168 hours. Diabetic: No results for input(s): "HGBA1C" in the last 72 hours. No results for input(s):  "GLUCAP" in the last 168 hours. Cardiac Enzymes: No results for input(s): "CKTOTAL", "CKMB", "CKMBINDEX", "TROPONINI" in the last 168 hours. No results for input(s): "PROBNP" in the last 8760 hours. Coagulation Profile: Recent Labs  Lab 03/18/23 1334 03/19/23 0431  INR 1.3* 1.2   Thyroid Function Tests: No results for input(s): "TSH", "T4TOTAL", "FREET4", "T3FREE", "THYROIDAB" in the last 72 hours. Lipid Profile: No results for input(s): "CHOL", "HDL", "LDLCALC", "TRIG", "CHOLHDL", "LDLDIRECT" in the last 72 hours. Anemia Panel: No results for input(s): "VITAMINB12", "FOLATE", "FERRITIN", "TIBC", "IRON", "RETICCTPCT" in the last 72 hours. Urine analysis:    Component Value Date/Time   COLORURINE YELLOW 03/19/2023 2212   APPEARANCEUR CLOUDY (A) 03/19/2023 2212   LABSPEC 1.039 (H) 03/19/2023 2212   PHURINE 5.0 03/19/2023 2212   GLUCOSEU NEGATIVE 03/19/2023 2212   GLUCOSEU NEGATIVE 11/09/2012 1537   HGBUR NEGATIVE 03/19/2023 2212   BILIRUBINUR NEGATIVE 03/19/2023 2212   KETONESUR NEGATIVE 03/19/2023 2212   PROTEINUR NEGATIVE 03/19/2023 2212   UROBILINOGEN 1.0 05/04/2015 1505   NITRITE NEGATIVE 03/19/2023 2212   LEUKOCYTESUR LARGE (A) 03/19/2023 2212   Sepsis Labs: Invalid input(s): "PROCALCITONIN", "LACTICIDVEN"  Microbiology: Recent Results (from the past 240 hour(s))  Resp panel by RT-PCR (RSV, Flu A&B, Covid) Anterior Nasal Swab     Status: None   Collection Time: 03/18/23 11:42 AM   Specimen: Anterior Nasal Swab  Result Value Ref Range Status   SARS Coronavirus 2 by RT PCR NEGATIVE NEGATIVE Final    Comment: (NOTE) SARS-CoV-2 target nucleic acids are NOT DETECTED.  The SARS-CoV-2 RNA is generally detectable in upper respiratory specimens during the acute phase of infection. The lowest concentration of SARS-CoV-2 viral copies this assay can detect is 138 copies/mL. A negative result does not preclude SARS-Cov-2 infection and should not be used as the sole basis for  treatment or other patient management decisions. A negative result may occur with  improper specimen collection/handling, submission of specimen other than nasopharyngeal swab, presence of viral mutation(s) within the areas targeted by this assay, and inadequate number of viral copies(<138 copies/mL). A negative result must be combined  with clinical observations, patient history, and epidemiological information. The expected result is Negative.  Fact Sheet for Patients:  BloggerCourse.com  Fact Sheet for Healthcare Providers:  SeriousBroker.it  This test is no t yet approved or cleared by the [place] FDA and  has been authorized for detection and/or diagnosis of SARS-CoV-2 by FDA under an Emergency Use Authorization (EUA). This EUA will remain  in effect (meaning this test can be used) for the duration of the COVID-19 declaration under Section 564(b)(1) of the Act, 21 U.S.C.section 360bbb-3(b)(1), unless the authorization is terminated  or revoked sooner.       Influenza A by PCR NEGATIVE NEGATIVE Final   Influenza B by PCR NEGATIVE NEGATIVE Final    Comment: (NOTE) The Xpert Xpress SARS-CoV-2/FLU/RSV plus assay is intended as an aid in the diagnosis of influenza from Nasopharyngeal swab specimens and should not be used as a sole basis for treatment. Nasal washings and aspirates are unacceptable for Xpert Xpress SARS-CoV-2/FLU/RSV testing.  Fact Sheet for Patients: BloggerCourse.com  Fact Sheet for Healthcare Providers: SeriousBroker.it  This test is not yet approved or cleared by the [place] FDA and has been authorized for detection and/or diagnosis of SARS-CoV-2 by FDA under an Emergency Use Authorization (EUA). This EUA will remain in effect (meaning this test can be used) for the duration of the COVID-19 declaration under Section 564(b)(1) of the Act, 21  U.S.C. section 360bbb-3(b)(1), unless the authorization is terminated or revoked.     Resp Syncytial Virus by PCR NEGATIVE NEGATIVE Final    Comment: (NOTE) Fact Sheet for Patients: BloggerCourse.com  Fact Sheet for Healthcare Providers: SeriousBroker.it  This test is not yet approved or cleared by the [place] FDA and has been authorized for detection and/or diagnosis of SARS-CoV-2 by FDA under an Emergency Use Authorization (EUA). This EUA will remain in effect (meaning this test can be used) for the duration of the COVID-19 declaration under Section 564(b)(1) of the Act, 21 U.S.C. section 360bbb-3(b)(1), unless the authorization is terminated or revoked.  Performed at [facility], [address], [place], [place] [address]   Blood Culture (routine x 2)     Status: None (Preliminary result)   Collection Time: 03/18/23  1:34 PM   Specimen: BLOOD RIGHT ARM  Result Value Ref Range Status   Specimen Description   Final    BLOOD RIGHT ARM Performed at [facility], [address] [place], [place] [address]    Special Requests   Final    BOTTLES DRAWN AEROBIC AND ANAEROBIC Blood Culture adequate volume Performed at [facility], [address], [place], [place] [address]    Culture   Final    NO GROWTH 2 DAYS Performed at [facility], [address], [place], [place] [address]    Report Status PENDING  Incomplete  Blood Culture (routine x 2)     Status: None (Preliminary result)   Collection Time: 03/18/23  1:34 PM   Specimen: BLOOD RIGHT HAND  Result Value Ref Range Status   Specimen Description   Final    BLOOD RIGHT HAND Performed at [facility], [address] [place], [place] [address]    Special Requests   Final    BOTTLES DRAWN AEROBIC AND ANAEROBIC Blood Culture adequate volume Performed at [facility], [address],  [place], [place] [address]    Culture   Final    NO GROWTH 2 DAYS Performed  at [facility], [address], [place], [place] [address]    Report Status PENDING  Incomplete    Radiology Studies: No results found.    Johnny Latu T. Porchea Charrier [facility]  If 7PM-7AM, please contact night-coverage [email or web address] 03/20/2023, 1:47 PM

## 2023-03-20 NOTE — Progress Notes (Signed)
   03/20/23 1519  Oxygen Therapy/Pulse Ox  O2 Device Room Air  O2 Therapy Room air  FiO2 (%) 21 %  SpO2 (!) (S)  86 % (Placed back on 2 L [place] post neb, pt was on room air when RT arrived. Tx given with oxygen.)  Safety Instructions Yes (Comment)

## 2023-03-20 NOTE — Evaluation (Signed)
Occupational Therapy Evaluation Patient Details Name: Carol Page MRN: [id number] DOB: 10-Jun-1936 Today's Date: 03/20/2023   History of Present Illness Patient is a 87 yo female presents to therapy following hospital admission on 03/18/2023 due to progressive SOB, dry cough and wheezing. Pt was hypoxic upon arrival to the ED 88% on RA, noted to have abnormal labs and imaging revealed PNA. Pt dx with sepsis secondary to PNA with acute respiratory failure with hypoxia. Pt PMH includes but is not limited to: HTN, HLD, anemia, and CVA.   Clinical Impression   Patient evaluated by Occupational Therapy with no further acute OT needs identified. All education has been completed and the patient has no further questions. Patient is a long term care resident at SNF who appears to be at baseline for ADLs at this time.  See below for any follow-up Occupational Therapy or equipment needs. OT is signing off. Thank you for this referral.        If plan is discharge home, recommend the following: Two people to help with walking and/or transfers;A lot of help with bathing/dressing/bathroom;Assistance with cooking/housework;Direct supervision/assist for medications management;Assist for transportation;Help with stairs or ramp for entrance;Direct supervision/assist for financial management    Functional Status Assessment  Patient has not had a recent decline in their functional status  Equipment Recommendations  None recommended by OT       Precautions / Restrictions Precautions Precautions: Fall Precaution Comments: monitor O2 Restrictions Weight Bearing Restrictions: No      Mobility Bed Mobility               General bed mobility comments: patient declinign to get to EOB at this time reporting that she does not want to.              ADL either performed or assessed with clinical judgement   ADL Overall ADL's : At baseline       General ADL Comments: patient appears to be at her  baseline with ADLs. patient was able to demonstrate ability to preform self feeding tasks and grooming tasks combing hair and washing face with set up at bed level in chair postioin. patient reported she does not like getting out of bed "it makes me sick" during session. patient also reporting that she has +2 A for transfers at the faciltiy for shower days.     Vision   Vision Assessment?: No apparent visual deficits            Pertinent Vitals/Pain Pain Assessment Pain Assessment: Faces Faces Pain Scale: Hurts a little bit Pain Location: RUE IV site Pain Descriptors / Indicators: Tender, Discomfort, Guarding Pain Intervention(s): Monitored during session (nurse made aware)     Extremity/Trunk Assessment Upper Extremity Assessment Upper Extremity Assessment: Generalized weakness (patient was noted to have edema in RUE with large brusing from blood draw on back of hand patient reporting that it happened this AM.)   Lower Extremity Assessment Lower Extremity Assessment: Defer to PT evaluation       Communication Communication Communication: No apparent difficulties   Cognition Arousal: Alert Behavior During Therapy: [facility] for tasks assessed/performed Overall Cognitive Status: No family/caregiver present to determine baseline cognitive functioning         General Comments: patient was oriented to self, she reported that she was 'at a rehab place" when asked. patient reported that she moved down here to live with daughter and had been at SNF ever since.  Home Living Family/patient expects to be discharged to:: Skilled nursing facility            Prior Functioning/Environment                 ADLs Comments: patient is a long term care resident she reports for about 1 year. patient reported she spends most time in bed and gets out of bed on shower days. patient reported there are supposed to be 2 people helping her to transfer on those days. patient  reporting she does not like to get out of bed. patient indicated she was able to feed and groom herself and the staff did the rest for her.                 OT Goals(Current goals can be found in the care plan section) Acute Rehab OT Goals OT Goal Formulation: All assessment and education complete, DC therapy  OT Frequency:         AM-PAC OT "6 Clicks" Daily Activity     Outcome Measure Help from another person eating meals?: A Little Help from another person taking care of personal grooming?: A Little Help from another person toileting, which includes using toliet, bedpan, or urinal?: Total Help from another person bathing (including washing, rinsing, drying)?: Total Help from another person to put on and taking off regular upper body clothing?: Total Help from another person to put on and taking off regular lower body clothing?: Total 6 Click Score: 10   End of Session Nurse Communication: Other (comment) (IV)  Activity Tolerance:   Patient left: in bed;with call bell/phone within reach;with bed alarm set  OT Visit Diagnosis: Pain                Time: 9604-5409 OT Time Calculation (min): 19 min Charges:  OT General Charges $OT Visit: 1 Visit OT Evaluation $OT Eval Low Complexity: 1 Low  Basma Buchner OTR/L, MS Acute Rehabilitation Department Office# [phone]   Selinda Flavin 03/20/2023, 11:58 AM

## 2023-03-21 DIAGNOSIS — E871 Hypo-osmolality and hyponatremia: Secondary | ICD-10-CM | POA: Diagnosis not present

## 2023-03-21 DIAGNOSIS — A419 Sepsis, unspecified organism: Secondary | ICD-10-CM | POA: Diagnosis not present

## 2023-03-21 DIAGNOSIS — J189 Pneumonia, unspecified organism: Secondary | ICD-10-CM | POA: Diagnosis not present

## 2023-03-21 DIAGNOSIS — J9601 Acute respiratory failure with hypoxia: Secondary | ICD-10-CM | POA: Diagnosis not present

## 2023-03-21 LAB — CBC
HCT: 27.1 % — ABNORMAL LOW (ref 36.0–46.0)
Hemoglobin: 8.9 g/dL — ABNORMAL LOW (ref 12.0–15.0)
MCH: 32 pg (ref 26.0–34.0)
MCHC: 32.8 g/dL (ref 30.0–36.0)
MCV: 97.5 fL (ref 80.0–100.0)
Platelets: 337 10*3/uL (ref 150–400)
RBC: 2.78 MIL/uL — ABNORMAL LOW (ref 3.87–5.11)
RDW: 12.9 % (ref 11.5–15.5)
WBC: 16.8 10*3/uL — ABNORMAL HIGH (ref 4.0–10.5)
nRBC: 0 % (ref 0.0–0.2)

## 2023-03-21 LAB — PROCALCITONIN: Procalcitonin: 0.12 ng/mL

## 2023-03-21 LAB — RENAL FUNCTION PANEL
Albumin: 2.6 g/dL — ABNORMAL LOW (ref 3.5–5.0)
Anion gap: 9 (ref 5–15)
BUN: 27 mg/dL — ABNORMAL HIGH (ref 8–23)
CO2: 23 mmol/L (ref 22–32)
Calcium: 8.2 mg/dL — ABNORMAL LOW (ref 8.9–10.3)
Chloride: 98 mmol/L (ref 98–111)
Creatinine, Ser: 1.44 mg/dL — ABNORMAL HIGH (ref 0.44–1.00)
GFR, Estimated: 36 mL/min — ABNORMAL LOW (ref >60)
Glucose, Bld: 115 mg/dL — ABNORMAL HIGH (ref 70–99)
Phosphorus: 2.9 mg/dL (ref 2.5–4.6)
Potassium: 3.6 mmol/L (ref 3.5–5.1)
Sodium: 130 mmol/L — ABNORMAL LOW (ref 135–145)

## 2023-03-21 LAB — MAGNESIUM: Magnesium: 1.6 mg/dL — ABNORMAL LOW (ref 1.7–2.4)

## 2023-03-21 MED ORDER — MAGNESIUM SULFATE 2 GM/50ML IV SOLN
2.0000 g | Freq: Once | INTRAVENOUS | Status: AC
  Administered 2023-03-21: 2 g via INTRAVENOUS
  Filled 2023-03-21: qty 50

## 2023-03-21 MED ORDER — LORAZEPAM 0.5 MG PO TABS
0.5000 mg | ORAL_TABLET | Freq: Once | ORAL | Status: AC
  Administered 2023-03-21: 0.5 mg via ORAL
  Filled 2023-03-21: qty 1

## 2023-03-21 MED ORDER — HYDROCOD POLI-CHLORPHE POLI ER 10-8 MG/5ML PO SUER: 5.0000 mL | Freq: Every evening | ORAL | Status: DC | PRN

## 2023-03-21 NOTE — Progress Notes (Signed)
PROGRESS NOTE  Carol Page BJY:[id number] DOB: 02/17/1936   PCP: Pcp, No  Patient is from: LTC at [facility]  DOA: 03/18/2023 LOS: 3  Chief complaints Chief Complaint  Patient presents with   Cough     Brief Narrative / Interim history: 87 year old F with PMH of CVA, prediabetes, HTN, HLD, CKD-3, anxiety, depression and morbid obesity presenting with increased shortness of breath, wheezing and dry cough for about 2 weeks, and admitted due to severe sepsis with hypoxic respiratory failure in the setting of community-acquired pneumonia.  She desaturated to 88% requiring 2 L to recover to mid 90s.  ABG reassuring. Cr 1.49 (about baseline).  BNP 87.  Serial troponin negative.  WBC 15.6 with left shift.  D-dimer elevated to 2.37.  CT angio negative for PE but multifocal pneumonia and dilated gallbladder in upper abdomen.  COVID-19, influenza and RSV PCR nonreactive.  Patient was started on ceftriaxone and azithromycin and admitted for further care.  Clinically improving but leukocytosis slightly worse.  Remains on IV ceftriaxone and Zithromax.    Subjective: Seen and examined this afternoon.  No major events overnight of this morning.  Reports cough but breathing has improved.  Refused lab draw this morning.  She is anxious about the nature.  Requesting something to help with anxiety before lab draw.  No other complaints.  Objective: Vitals:   03/21/23 0553 03/21/23 0815 03/21/23 0817 03/21/23 1225  BP: 135/82   (!) 153/46  Pulse: 87   80  Resp: 18   18  Temp: 98.3 F (36.8 C)   98.1 F (36.7 C)  TempSrc: Oral   Oral  SpO2: 98% 93% 93% 90%  Weight:      Height:        Examination:  GENERAL: No apparent distress.  Nontoxic. HEENT: MMM.  Vision and hearing grossly intact.  NECK: Supple.  No apparent JVD.  RESP:  No IWOB.  Fair aeration bilaterally. Rhonchi. CVS:  RRR. Heart sounds normal.  ABD/GI/GU: BS+. Abd soft.  Diffuse tenderness all over. MSK/EXT:  Moves extremities.  No apparent deformity.  1-2 BLE edema. SKIN: no apparent skin lesion or wound NEURO: Awake, alert and oriented appropriately.  No apparent focal neuro deficit. PSYCH: Calm. Normal affect.   Procedures:  None  Microbiology summarized: COVID-19, influenza and RSV PCR nonreactive Blood cultures NGTD  Assessment and plan: Principal Problem:   Severe sepsis (HCC) Active Problems:   Multifocal pneumonia   Hyponatremia   Essential hypertension   Pre-diabetes   Anxiety and depression   History of CVA (cerebrovascular accident)   CKD (chronic kidney disease) stage 3, GFR 30-59 ml/min (HCC)   Morbid obesity (HCC)   Dyslipidemia   GERD without esophagitis   Acute respiratory failure with hypoxia (HCC)  Severe sepsis due to multifocal pneumonia: POA.  Has leukocytosis with left shift, tachypnea and respiratory failure on presentation.  CTA chest negative for PE but multifocal pneumonia.  Blood cultures NGTD.  Symptoms improving but leukocytosis slightly worse.  No UTI symptoms but should be covered on current antibiotics for UTI.  -Continue CTX and Zithromax -Continue scheduled and as needed nebs -Mucolytic's and antitussive -Trend procalcitonin and leukocytosis  Acute respiratory failure with hypoxia: Reportedly desaturated to 88% and started on 2 L while in ED. -Manage pneumonia as above -Wean oxygen as able -Incentive spirometry, OOB  Elevated D-dimer: CT angio chest negative for central PE.  LE venous Doppler negative for DVT -Continue VTE prophylaxis  History of CVA/hyperlipidemia: No focal  neuro deficit. -Continue home Plavix and statin  Normocytic anemia: H&H relatively stable Recent Labs    03/18/23 1142 03/19/23 0431 03/20/23 0451 03/21/23 0940  HGB 9.0* 8.5* 9.0* 8.9*  -Continue monitoring  CKD-3B: Cr stable. Recent Labs    03/18/23 1142 03/19/23 0431 03/20/23 0451 03/21/23 0940  BUN 31* 28* 30* 27*  CREATININE 1.49* 1.43* 1.69* 1.44*  -Discontinued  lisinopril on 8/31. -Continue monitoring  LUTS/history of urinary incontinence -Continue home Flomax and Myrbetriq  Hyponatremia: Mild and stable. -Monitor off IV fluid   Essential hypertension: Normotensive -Continue home Cardizem  Hypomagnesemia -Monitor replenish  Hypothyroidism -Continue home Synthroid.   GERD without esophagitis -Continue PPI  Anxiety and depression -Continue Wellbutrin XL.  Physical deconditioning -PT/OT   Morbid obesity Body mass index is 38.62 kg/m.          DVT prophylaxis:  enoxaparin (LOVENOX) injection 30 mg Start: 03/20/23 1800  Code Status: DNR/DNI Family Communication: Updated patient's daughter over the phone on 8/31.  None at bedside today. Level of care: Med-Surg Status is: Inpatient Remains inpatient appropriate because: Severe sepsis due to pneumonia and respiratory failure   Final disposition: SNF Consultants:  None  35 minutes with more than 50% spent in reviewing records, counseling patient/family and coordinating care.   Sch Meds:  Scheduled Meds:  atorvastatin  80 mg Oral Daily   buPROPion  75 mg Oral Daily   cholecalciferol  1,000 Units Oral Daily   clopidogrel  75 mg Oral QPM   cyanocobalamin  500 mcg Oral Daily   diltiazem  180 mg Oral Daily   enoxaparin (LOVENOX) injection  30 mg Subcutaneous Q24H   escitalopram  5 mg Oral Daily   ferrous sulfate  324 mg Oral Q M,W,F   guaiFENesin  600 mg Oral BID   levothyroxine  50 mcg Oral Q0600   loratadine  10 mg Oral Daily   mirabegron ER  25 mg Oral Daily   pantoprazole  40 mg Oral Daily   tamsulosin  0.4 mg Oral Daily   Continuous Infusions:  azithromycin 500 mg (03/21/23 1214)   cefTRIAXone (ROCEPHIN)  IV 2 g (03/20/23 2132)   magnesium sulfate bolus IVPB 2 g (03/21/23 1212)   PRN Meds:.acetaminophen **OR** acetaminophen, benzonatate, guaiFENesin, ipratropium-albuterol, loperamide, ondansetron **OR** ondansetron (ZOFRAN) IV, polyethylene glycol,  traZODone  Antimicrobials: Anti-infectives (From admission, onward)    Start     Dose/Rate Route Frequency Ordered Stop   03/19/23 1000  azithromycin (ZITHROMAX) 500 mg in sodium chloride 0.9 % 250 mL IVPB        500 mg 250 mL/hr over 60 Minutes Intravenous Every 24 hours 03/18/23 1602 03/24/23 0959   03/19/23 1000  cefTRIAXone (ROCEPHIN) 2 g in sodium chloride 0.9 % 100 mL IVPB  Status:  Discontinued        2 g 200 mL/hr over 30 Minutes Intravenous Every 24 hours 03/18/23 1633 03/20/23 0809   03/18/23 2200  cefTRIAXone (ROCEPHIN) 2 g in sodium chloride 0.9 % 100 mL IVPB        2 g 200 mL/hr over 30 Minutes Intravenous Every 24 hours 03/18/23 1602 03/23/23 2159   03/18/23 1645  cefTRIAXone (ROCEPHIN) 1 g in sodium chloride 0.9 % 100 mL IVPB        1 g 200 mL/hr over 30 Minutes Intravenous  Once 03/18/23 1632 03/18/23 1959   03/18/23 1300  cefTRIAXone (ROCEPHIN) 1 g in sodium chloride 0.9 % 100 mL IVPB  1 g 200 mL/hr over 30 Minutes Intravenous  Once 03/18/23 1246 03/18/23 1528   03/18/23 1300  azithromycin (ZITHROMAX) 500 mg in sodium chloride 0.9 % 250 mL IVPB        500 mg 250 mL/hr over 60 Minutes Intravenous  Once 03/18/23 1246 03/18/23 1528        I have personally reviewed the following labs and images: CBC: Recent Labs  Lab 03/18/23 1142 03/19/23 0431 03/20/23 0451 03/21/23 0940  WBC 13.6* 13.6* 14.5* 16.8*  NEUTROABS 10.4*  --   --   --   HGB 9.0* 8.5* 9.0* 8.9*  HCT 26.3* 26.4* 28.8* 27.1*  MCV 95.3 97.8 99.7 97.5  PLT 272 281 296 337   BMP &GFR Recent Labs  Lab 03/18/23 1142 03/19/23 0431 03/20/23 0451 03/21/23 0940  NA 130* 132* 134* 130*  K 3.8 4.0 4.3 3.6  CL 97* 101 100 98  CO2 24 23 23 23   GLUCOSE 107* 86 72 115*  BUN 31* 28* 30* 27*  CREATININE 1.49* 1.43* 1.69* 1.44*  CALCIUM 8.0* 8.0* 8.6* 8.2*  MG  --   --  1.8 1.6*  PHOS  --   --  3.8 2.9   Estimated Creatinine Clearance: 33.2 mL/min (A) (by C-G formula based on SCr of 1.44 mg/dL  (H)). Liver & Pancreas: Recent Labs  Lab 03/18/23 1142 03/20/23 0451 03/21/23 0940  AST 17  --   --   ALT 16  --   --   ALKPHOS 78  --   --   BILITOT 0.7  --   --   PROT 5.9*  --   --   ALBUMIN 2.7* 2.6* 2.6*   Recent Labs  Lab 03/18/23 1142  LIPASE 22   No results for input(s): "AMMONIA" in the last 168 hours. Diabetic: No results for input(s): "HGBA1C" in the last 72 hours. No results for input(s): "GLUCAP" in the last 168 hours. Cardiac Enzymes: No results for input(s): "CKTOTAL", "CKMB", "CKMBINDEX", "TROPONINI" in the last 168 hours. No results for input(s): "PROBNP" in the last 8760 hours. Coagulation Profile: Recent Labs  Lab 03/18/23 1334 03/19/23 0431  INR 1.3* 1.2   Thyroid Function Tests: No results for input(s): "TSH", "T4TOTAL", "FREET4", "T3FREE", "THYROIDAB" in the last 72 hours. Lipid Profile: No results for input(s): "CHOL", "HDL", "LDLCALC", "TRIG", "CHOLHDL", "LDLDIRECT" in the last 72 hours. Anemia Panel: No results for input(s): "VITAMINB12", "FOLATE", "FERRITIN", "TIBC", "IRON", "RETICCTPCT" in the last 72 hours. Urine analysis:    Component Value Date/Time   COLORURINE YELLOW 03/19/2023 2212   APPEARANCEUR CLOUDY (A) 03/19/2023 2212   LABSPEC 1.039 (H) 03/19/2023 2212   PHURINE 5.0 03/19/2023 2212   GLUCOSEU NEGATIVE 03/19/2023 2212   GLUCOSEU NEGATIVE 11/09/2012 1537   HGBUR NEGATIVE 03/19/2023 2212   BILIRUBINUR NEGATIVE 03/19/2023 2212   KETONESUR NEGATIVE 03/19/2023 2212   PROTEINUR NEGATIVE 03/19/2023 2212   UROBILINOGEN 1.0 05/04/2015 1505   NITRITE NEGATIVE 03/19/2023 2212   LEUKOCYTESUR LARGE (A) 03/19/2023 2212   Sepsis Labs: Invalid input(s): "PROCALCITONIN", "LACTICIDVEN"  Microbiology: Recent Results (from the past 240 hour(s))  Resp panel by RT-PCR (RSV, Flu A&B, Covid) Anterior Nasal Swab     Status: None   Collection Time: 03/18/23 11:42 AM   Specimen: Anterior Nasal Swab  Result Value Ref Range Status   SARS  Coronavirus 2 by RT PCR NEGATIVE NEGATIVE Final    Comment: (NOTE) SARS-CoV-2 target nucleic acids are NOT DETECTED.  The SARS-CoV-2 RNA is generally detectable in  upper respiratory specimens during the acute phase of infection. The lowest concentration of SARS-CoV-2 viral copies this assay can detect is 138 copies/mL. A negative result does not preclude SARS-Cov-2 infection and should not be used as the sole basis for treatment or other patient management decisions. A negative result may occur with  improper specimen collection/handling, submission of specimen other than nasopharyngeal swab, presence of viral mutation(s) within the areas targeted by this assay, and inadequate number of viral copies(<138 copies/mL). A negative result must be combined with clinical observations, patient history, and epidemiological information. The expected result is Negative.  Fact Sheet for Patients:  BloggerCourse.com  Fact Sheet for Healthcare Providers:  SeriousBroker.it  This test is no t yet approved or cleared by the [place] FDA and  has been authorized for detection and/or diagnosis of SARS-CoV-2 by FDA under an Emergency Use Authorization (EUA). This EUA will remain  in effect (meaning this test can be used) for the duration of the COVID-19 declaration under Section 564(b)(1) of the Act, 21 U.S.C.section 360bbb-3(b)(1), unless the authorization is terminated  or revoked sooner.       Influenza A by PCR NEGATIVE NEGATIVE Final   Influenza B by PCR NEGATIVE NEGATIVE Final    Comment: (NOTE) The Xpert Xpress SARS-CoV-2/FLU/RSV plus assay is intended as an aid in the diagnosis of influenza from Nasopharyngeal swab specimens and should not be used as a sole basis for treatment. Nasal washings and aspirates are unacceptable for Xpert Xpress SARS-CoV-2/FLU/RSV testing.  Fact Sheet for  Patients: BloggerCourse.com  Fact Sheet for Healthcare Providers: SeriousBroker.it  This test is not yet approved or cleared by the [place] FDA and has been authorized for detection and/or diagnosis of SARS-CoV-2 by FDA under an Emergency Use Authorization (EUA). This EUA will remain in effect (meaning this test can be used) for the duration of the COVID-19 declaration under Section 564(b)(1) of the Act, 21 U.S.C. section 360bbb-3(b)(1), unless the authorization is terminated or revoked.     Resp Syncytial Virus by PCR NEGATIVE NEGATIVE Final    Comment: (NOTE) Fact Sheet for Patients: BloggerCourse.com  Fact Sheet for Healthcare Providers: SeriousBroker.it  This test is not yet approved or cleared by the [place] FDA and has been authorized for detection and/or diagnosis of SARS-CoV-2 by FDA under an Emergency Use Authorization (EUA). This EUA will remain in effect (meaning this test can be used) for the duration of the COVID-19 declaration under Section 564(b)(1) of the Act, 21 U.S.C. section 360bbb-3(b)(1), unless the authorization is terminated or revoked.  Performed at [facility], [address] [place], [place] [address]   Blood Culture (routine x 2)     Status: None (Preliminary result)   Collection Time: 03/18/23  1:34 PM   Specimen: BLOOD RIGHT ARM  Result Value Ref Range Status   Specimen Description   Final    BLOOD RIGHT ARM Performed at [facility], [address] [place], [place] [address]    Special Requests   Final    BOTTLES DRAWN AEROBIC AND ANAEROBIC Blood Culture adequate volume Performed at [facility], [address] [place], [place] [address]    Culture   Final    NO GROWTH 3 DAYS Performed at [facility], [address], [place], [place] [address]    Report Status PENDING   Incomplete  Blood Culture (routine x 2)     Status: None (Preliminary result)   Collection Time: 03/18/23  1:34 PM   Specimen: BLOOD RIGHT HAND  Result Value Ref Range Status   Specimen Description   Final    BLOOD RIGHT HAND Performed at [facility], [address] [place], [place] [address]    Special Requests   Final    BOTTLES DRAWN AEROBIC AND ANAEROBIC Blood Culture adequate volume Performed at [facility], [address], [place], [place] [address]    Culture   Final    NO GROWTH 3 DAYS Performed at [facility], [address], [place], [place] [address]    Report Status PENDING  Incomplete    Radiology Studies: No results found.    Arizona Nordquist T. Daryle Boyington [facility]  If 7PM-7AM, please contact night-coverage [email or web address] 03/21/2023, 12:35 PM

## 2023-03-22 ENCOUNTER — Inpatient Hospital Stay (HOSPITAL_COMMUNITY): Payer: Medicare Other

## 2023-03-22 DIAGNOSIS — A419 Sepsis, unspecified organism: Secondary | ICD-10-CM | POA: Diagnosis not present

## 2023-03-22 DIAGNOSIS — E871 Hypo-osmolality and hyponatremia: Secondary | ICD-10-CM | POA: Diagnosis not present

## 2023-03-22 DIAGNOSIS — J189 Pneumonia, unspecified organism: Secondary | ICD-10-CM | POA: Diagnosis not present

## 2023-03-22 DIAGNOSIS — J9601 Acute respiratory failure with hypoxia: Secondary | ICD-10-CM | POA: Diagnosis not present

## 2023-03-22 LAB — RESPIRATORY PANEL BY PCR

## 2023-03-22 LAB — RENAL FUNCTION PANEL
Albumin: 2.5 g/dL — ABNORMAL LOW (ref 3.5–5.0)
Anion gap: 7 (ref 5–15)
BUN: 21 mg/dL (ref 8–23)
CO2: 25 mmol/L (ref 22–32)
Calcium: 8.2 mg/dL — ABNORMAL LOW (ref 8.9–10.3)
Chloride: 101 mmol/L (ref 98–111)
Creatinine, Ser: 1.25 mg/dL — ABNORMAL HIGH (ref 0.44–1.00)
GFR, Estimated: 42 mL/min — ABNORMAL LOW (ref >60)
Glucose, Bld: 90 mg/dL (ref 70–99)
Phosphorus: 3 mg/dL (ref 2.5–4.6)
Potassium: 3.9 mmol/L (ref 3.5–5.1)
Sodium: 133 mmol/L — ABNORMAL LOW (ref 135–145)

## 2023-03-22 LAB — DIFFERENTIAL
Abs Immature Granulocytes: 1.88 10*3/uL — ABNORMAL HIGH (ref 0.00–0.07)
Basophils Absolute: 0.1 10*3/uL (ref 0.0–0.1)
Basophils Relative: 0 %
Eosinophils Absolute: 0.4 10*3/uL (ref 0.0–0.5)
Eosinophils Relative: 2 %
Immature Granulocytes: 11 %
Lymphocytes Relative: 6 %
Lymphs Abs: 1 10*3/uL (ref 0.7–4.0)
Monocytes Absolute: 1.5 10*3/uL — ABNORMAL HIGH (ref 0.1–1.0)
Monocytes Relative: 9 %
Neutro Abs: 12 10*3/uL — ABNORMAL HIGH (ref 1.7–7.7)
Neutrophils Relative %: 72 %

## 2023-03-22 LAB — CBC
HCT: 26.7 % — ABNORMAL LOW (ref 36.0–46.0)
Hemoglobin: 8.7 g/dL — ABNORMAL LOW (ref 12.0–15.0)
MCH: 31.9 pg (ref 26.0–34.0)
MCHC: 32.6 g/dL (ref 30.0–36.0)
MCV: 97.8 fL (ref 80.0–100.0)
Platelets: 324 10*3/uL (ref 150–400)
RBC: 2.73 MIL/uL — ABNORMAL LOW (ref 3.87–5.11)
RDW: 13.1 % (ref 11.5–15.5)
WBC: 17.1 10*3/uL — ABNORMAL HIGH (ref 4.0–10.5)
nRBC: 0 % (ref 0.0–0.2)

## 2023-03-22 LAB — BRAIN NATRIURETIC PEPTIDE: B Natriuretic Peptide: 103.5 pg/mL — ABNORMAL HIGH (ref 0.0–100.0)

## 2023-03-22 LAB — MAGNESIUM: Magnesium: 1.8 mg/dL (ref 1.7–2.4)

## 2023-03-22 MED ORDER — DOXYCYCLINE HYCLATE 100 MG PO TABS
100.0000 mg | ORAL_TABLET | Freq: Two times a day (BID) | ORAL | Status: DC
  Administered 2023-03-22 – 2023-03-23 (×3): 100 mg via ORAL
  Filled 2023-03-22 (×3): qty 1

## 2023-03-22 MED ORDER — ENOXAPARIN SODIUM 40 MG/0.4ML IJ SOSY
40.0000 mg | PREFILLED_SYRINGE | INTRAMUSCULAR | Status: DC
  Administered 2023-03-22: 40 mg via SUBCUTANEOUS
  Filled 2023-03-22: qty 0.4

## 2023-03-22 MED ORDER — HYDROCOD POLI-CHLORPHE POLI ER 10-8 MG/5ML PO SUER
5.0000 mL | Freq: Two times a day (BID) | ORAL | Status: DC | PRN
  Administered 2023-03-22 – 2023-03-23 (×2): 5 mL via ORAL
  Filled 2023-03-22 (×2): qty 5

## 2023-03-22 NOTE — Progress Notes (Signed)
Physical Therapy Treatment Patient Details Name: Carol Page MRN: [id number] DOB: 1936/04/28 Today's Date: 03/22/2023   History of Present Illness Patient is a 87 yo female presents to therapy following hospital admission on 03/18/2023 due to progressive SOB, dry cough and wheezing. Pt was hypoxic upon arrival to the ED 88% on RA, noted to have abnormal labs and imaging revealed PNA. Pt dx with sepsis secondary to PNA with acute respiratory failure with hypoxia. Pt PMH includes but is not limited to: HTN, HLD, anemia, and CVA.    PT Comments  Pt agreeable to bed level exercises on today. Quite a bit of coughing-pt reported she hasn't been able to cough anything up. Remained on [place] O2 during session. Pt will likely require lift equipment for OOB<>chair transfers. Per chart review, pt is [facility] SNF resident.     If plan is discharge home, recommend the following: Two people to help with walking and/or transfers;A lot of help with bathing/dressing/bathroom;Assistance with cooking/housework;Assist for transportation;Help with stairs or ramp for entrance   Can travel by private vehicle     No  Equipment Recommendations  None recommended by PT    Recommendations for Other Services       Precautions / Restrictions Precautions Precautions: Fall Precaution Comments: monitor O2 Restrictions Weight Bearing Restrictions: No     Mobility  Bed Mobility               General bed mobility comments: NT-bed exercises only    Transfers                        Ambulation/Gait                   Stairs             Wheelchair Mobility     Tilt Bed    Modified Rankin (Stroke Patients Only)       Balance                                            Cognition Arousal: Alert (but drowsy) Behavior During Therapy: WFL for tasks assessed/performed Overall Cognitive Status: No family/caregiver present to determine baseline cognitive functioning                                           Exercises General Exercises - Upper Extremity Shoulder Flexion: AAROM, Both, 10 reps Elbow Flexion: AAROM, Both, 10 reps General Exercises - Lower Extremity Ankle Circles/Pumps: AROM, Both, 10 reps Heel Slides: AAROM, Both, 10 reps Hip ABduction/ADduction: AAROM, Both, 10 reps    General Comments        Pertinent Vitals/Pain Pain Assessment Pain Assessment: Faces Faces Pain Scale: Hurts a little bit Pain Location: L upper chest Pain Descriptors / Indicators: Discomfort Pain Intervention(s): Limited activity within patient's tolerance, Monitored during session    Home Living                          Prior Function            PT Goals (current goals can now be found in the care plan section) Progress towards PT goals: Progressing toward goals    Frequency    Min  1X/week      PT Plan      Co-evaluation              AM-PAC PT "6 Clicks" Mobility   Outcome Measure  Help needed turning from your back to your side while in a flat bed without using bedrails?: Total Help needed moving from lying on your back to sitting on the side of a flat bed without using bedrails?: Total Help needed moving to and from a bed to a chair (including a wheelchair)?: Total Help needed standing up from a chair using your arms (e.g., wheelchair or bedside chair)?: Total Help needed to walk in hospital room?: Total Help needed climbing 3-5 steps with a railing? : Total 6 Click Score: 6    End of Session Equipment Utilized During Treatment: Oxygen Activity Tolerance: Patient tolerated treatment well Patient left: in bed;with call bell/phone within reach;with bed alarm set   PT Visit Diagnosis: Unsteadiness on feet (R26.81);Other abnormalities of gait and mobility (R26.89);Muscle weakness (generalized) (M62.81);History of falling (Z91.81);Difficulty in walking, not elsewhere classified (R26.2);Pain     Time:  1207-1219 PT Time Calculation (min) (ACUTE ONLY): 12 min  Charges:    $Therapeutic Exercise: 8-22 mins PT General Charges $$ ACUTE PT VISIT: 1 Visit                         Faye Ramsay, PT Acute Rehabilitation  Office: [phone]

## 2023-03-22 NOTE — Progress Notes (Signed)
PROGRESS NOTE  Carol Page SAY:[id number] DOB: 1935-09-25   PCP: Pcp, No  Patient is from: LTC at [facility]  DOA: 03/18/2023 LOS: 4  Chief complaints Chief Complaint  Patient presents with   Cough     Brief Narrative / Interim history: 87 year old F with PMH of CVA, prediabetes, HTN, HLD, CKD-3, anxiety, depression and morbid obesity presenting with increased shortness of breath, wheezing and dry cough for about 2 weeks, and admitted due to severe sepsis with hypoxic respiratory failure in the setting of community-acquired pneumonia.  She desaturated to 88% requiring 2 L to recover to mid 90s.  ABG reassuring. Cr 1.49 (about baseline).  BNP 85.  Serial troponin negative.  WBC 15.6 with left shift.  D-dimer elevated to 2.37.  CT angio negative for PE but multifocal pneumonia and dilated gallbladder in upper abdomen.  COVID-19, influenza and RSV PCR nonreactive.  Patient was started on ceftriaxone and azithromycin and admitted for further care.  Clinically improving but leukocytosis worse.  Remains on IV ceftriaxone and Zithromax.    Subjective: Seen and examined this afternoon.  No major events overnight of this morning.  Reports persistent cough and asking for cough syrup.  Feels chest pain and short of breath when she coughs.  Portable CXR yesterday with mildly increased patchy RUL airspace disease and stable bibasilar airspace disease and small pleural effusions.  Leukocytosis worse  Objective: Vitals:   03/21/23 1815 03/21/23 2151 03/22/23 0633 03/22/23 0956  BP:  (!) 145/44 (!) 141/86 131/64  Pulse:  81 81 82  Resp:  18 20 20   Temp:  98.1 F (36.7 C) 98.1 F (36.7 C) 98.1 F (36.7 C)  TempSrc:  Axillary Oral Oral  SpO2: 93% (!) 89% 98% 98%  Weight:      Height:        Examination:  GENERAL: No apparent distress.  Nontoxic. HEENT: MMM.  Vision and hearing grossly intact.  NECK: Supple.  No apparent JVD.  RESP:  No IWOB.  Fair aeration bilaterally but limited exam  due to body habitus. CVS:  RRR. Heart sounds normal.  ABD/GI/GU: BS+. Abd soft.  Diffuse tenderness all over. MSK/EXT:  Moves extremities but weak. No apparent deformity.  1-2 BLE edema. SKIN: no apparent skin lesion or wound NEURO: Awake, alert and oriented fairly.  No apparent focal neuro deficit. PSYCH: Calm. Normal affect.   Procedures:  None  Microbiology summarized: COVID-19, influenza and RSV PCR nonreactive Blood cultures NGTD  Assessment and plan: Principal Problem:   Severe sepsis (HCC) Active Problems:   Multifocal pneumonia   Hyponatremia   Essential hypertension   Pre-diabetes   Anxiety and depression   History of CVA (cerebrovascular accident)   CKD (chronic kidney disease) stage 3, GFR 30-59 ml/min (HCC)   Morbid obesity (HCC)   Dyslipidemia   GERD without esophagitis   Acute respiratory failure with hypoxia (HCC)  Severe sepsis due to multifocal pneumonia: POA.  Has leukocytosis with left shift, tachypnea and respiratory failure on presentation.  CTA chest negative for PE but multifocal pneumonia.  Blood cultures NGTD.  CXR with increased RUL infiltrate.  Continues to endorse cough.  Leukocytosis slightly up but procalcitonin improving.  BUN only 103.  Aspiration? -Continue CTX.  Change Zithromax to doxycycline -Check full RVP and sputum culture. -Continue scheduled and as needed nebs -Mucolytic's and antitussive -Discontinue lisinopril.  Commend discontinuing this indefinitely. -Trend procalcitonin and leukocytosis  Acute respiratory failure with hypoxia: Reportedly desaturated to 88% and started on 2 L  while in ED. currently saturating in upper 90s on 2 L. -Manage pneumonia as above -Wean oxygen as able -Encourage incentive spirometry -Aspiration precaution  Elevated D-dimer: CT angio chest negative for central PE.  LE venous Doppler negative for DVT -Continue VTE prophylaxis  History of CVA/hyperlipidemia: No focal neuro deficit. -Continue home  Plavix and statin  Normocytic anemia: H&H relatively stable Recent Labs    03/18/23 1142 03/19/23 0431 03/20/23 0451 03/21/23 0940 03/22/23 0854  HGB 9.0* 8.5* 9.0* 8.9* 8.7*  -Continue monitoring  CKD-3B: Cr stable. Recent Labs    03/18/23 1142 03/19/23 0431 03/20/23 0451 03/21/23 0940 03/22/23 0854  BUN 31* 28* 30* 27* 21  CREATININE 1.49* 1.43* 1.69* 1.44* 1.25*  -Discontinued lisinopril on 8/31. -Continue monitoring  LUTS/history of urinary incontinence -Continue home Flomax and Myrbetriq  Hyponatremia: Mild and stable. -Monitor off IV fluid   Essential hypertension: Normotensive -Continue home Cardizem  Hypomagnesemia -Monitor replenish  Hypothyroidism -Continue home Synthroid.   GERD without esophagitis -Continue PPI  Anxiety and depression -Continue Wellbutrin XL.  Physical deconditioning -PT/OT   Morbid obesity Body mass index is 38.62 kg/m.          DVT prophylaxis:  enoxaparin (LOVENOX) injection 30 mg Start: 03/20/23 1800  Code Status: DNR/DNI Family Communication: None at bedside. Level of care: Med-Surg Status is: Inpatient Remains inpatient appropriate because: Severe sepsis due to pneumonia and respiratory failure   Final disposition: SNF Consultants:  None  35 minutes with more than 50% spent in reviewing records, counseling patient/family and coordinating care.   Sch Meds:  Scheduled Meds:  atorvastatin  80 mg Oral Daily   buPROPion  75 mg Oral Daily   cholecalciferol  1,000 Units Oral Daily   clopidogrel  75 mg Oral QPM   cyanocobalamin  500 mcg Oral Daily   diltiazem  180 mg Oral Daily   doxycycline  100 mg Oral Q12H   enoxaparin (LOVENOX) injection  30 mg Subcutaneous Q24H   escitalopram  5 mg Oral Daily   ferrous sulfate  324 mg Oral Q M,W,F   guaiFENesin  600 mg Oral BID   levothyroxine  50 mcg Oral Q0600   loratadine  10 mg Oral Daily   mirabegron ER  25 mg Oral Daily   pantoprazole  40 mg Oral Daily    tamsulosin  0.4 mg Oral Daily   Continuous Infusions:  cefTRIAXone (ROCEPHIN)  IV 2 g (03/21/23 2125)   PRN Meds:.acetaminophen **OR** acetaminophen, chlorpheniramine-HYDROcodone, guaiFENesin, ipratropium-albuterol, loperamide, ondansetron **OR** ondansetron (ZOFRAN) IV, polyethylene glycol, traZODone  Antimicrobials: Anti-infectives (From admission, onward)    Start     Dose/Rate Route Frequency Ordered Stop   03/22/23 1100  doxycycline (VIBRA-TABS) tablet 100 mg        100 mg Oral Every 12 hours 03/22/23 0951 03/27/23 0959   03/19/23 1000  azithromycin (ZITHROMAX) 500 mg in sodium chloride 0.9 % 250 mL IVPB  Status:  Discontinued        500 mg 250 mL/hr over 60 Minutes Intravenous Every 24 hours 03/18/23 1602 03/22/23 0951   03/19/23 1000  cefTRIAXone (ROCEPHIN) 2 g in sodium chloride 0.9 % 100 mL IVPB  Status:  Discontinued        2 g 200 mL/hr over 30 Minutes Intravenous Every 24 hours 03/18/23 1633 03/20/23 0809   03/18/23 2200  cefTRIAXone (ROCEPHIN) 2 g in sodium chloride 0.9 % 100 mL IVPB        2 g 200 mL/hr over 30 Minutes Intravenous  Every 24 hours 03/18/23 1602 03/23/23 2159   03/18/23 1645  cefTRIAXone (ROCEPHIN) 1 g in sodium chloride 0.9 % 100 mL IVPB        1 g 200 mL/hr over 30 Minutes Intravenous  Once 03/18/23 1632 03/18/23 1959   03/18/23 1300  cefTRIAXone (ROCEPHIN) 1 g in sodium chloride 0.9 % 100 mL IVPB        1 g 200 mL/hr over 30 Minutes Intravenous  Once 03/18/23 1246 03/18/23 1528   03/18/23 1300  azithromycin (ZITHROMAX) 500 mg in sodium chloride 0.9 % 250 mL IVPB        500 mg 250 mL/hr over 60 Minutes Intravenous  Once 03/18/23 1246 03/18/23 1528        I have personally reviewed the following labs and images: CBC: Recent Labs  Lab 03/18/23 1142 03/19/23 0431 03/20/23 0451 03/21/23 0940 03/22/23 0854  WBC 13.6* 13.6* 14.5* 16.8* 17.1*  NEUTROABS 10.4*  --   --   --  12.0*  HGB 9.0* 8.5* 9.0* 8.9* 8.7*  HCT 26.3* 26.4* 28.8* 27.1* 26.7*   MCV 95.3 97.8 99.7 97.5 97.8  PLT 272 281 296 337 324   BMP &GFR Recent Labs  Lab 03/18/23 1142 03/19/23 0431 03/20/23 0451 03/21/23 0940 03/22/23 0854  NA 130* 132* 134* 130* 133*  K 3.8 4.0 4.3 3.6 3.9  CL 97* 101 100 98 101  CO2 24 23 23 23 25   GLUCOSE 107* 86 72 115* 90  BUN 31* 28* 30* 27* 21  CREATININE 1.49* 1.43* 1.69* 1.44* 1.25*  CALCIUM 8.0* 8.0* 8.6* 8.2* 8.2*  MG  --   --  1.8 1.6* 1.8  PHOS  --   --  3.8 2.9 3.0   Estimated Creatinine Clearance: 38.3 mL/min (A) (by C-G formula based on SCr of 1.25 mg/dL (H)). Liver & Pancreas: Recent Labs  Lab 03/18/23 1142 03/20/23 0451 03/21/23 0940 03/22/23 0854  AST 17  --   --   --   ALT 16  --   --   --   ALKPHOS 78  --   --   --   BILITOT 0.7  --   --   --   PROT 5.9*  --   --   --   ALBUMIN 2.7* 2.6* 2.6* 2.5*   Recent Labs  Lab 03/18/23 1142  LIPASE 22   No results for input(s): "AMMONIA" in the last 168 hours. Diabetic: No results for input(s): "HGBA1C" in the last 72 hours. No results for input(s): "GLUCAP" in the last 168 hours. Cardiac Enzymes: No results for input(s): "CKTOTAL", "CKMB", "CKMBINDEX", "TROPONINI" in the last 168 hours. No results for input(s): "PROBNP" in the last 8760 hours. Coagulation Profile: Recent Labs  Lab 03/18/23 1334 03/19/23 0431  INR 1.3* 1.2   Thyroid Function Tests: No results for input(s): "TSH", "T4TOTAL", "FREET4", "T3FREE", "THYROIDAB" in the last 72 hours. Lipid Profile: No results for input(s): "CHOL", "HDL", "LDLCALC", "TRIG", "CHOLHDL", "LDLDIRECT" in the last 72 hours. Anemia Panel: No results for input(s): "VITAMINB12", "FOLATE", "FERRITIN", "TIBC", "IRON", "RETICCTPCT" in the last 72 hours. Urine analysis:    Component Value Date/Time   COLORURINE YELLOW 03/19/2023 2212   APPEARANCEUR CLOUDY (A) 03/19/2023 2212   LABSPEC 1.039 (H) 03/19/2023 2212   PHURINE 5.0 03/19/2023 2212   GLUCOSEU NEGATIVE 03/19/2023 2212   GLUCOSEU NEGATIVE 11/09/2012  1537   HGBUR NEGATIVE 03/19/2023 2212   BILIRUBINUR NEGATIVE 03/19/2023 2212   KETONESUR NEGATIVE 03/19/2023 2212   PROTEINUR  NEGATIVE 03/19/2023 2212   UROBILINOGEN 1.0 05/04/2015 1505   NITRITE NEGATIVE 03/19/2023 2212   LEUKOCYTESUR LARGE (A) 03/19/2023 2212   Sepsis Labs: Invalid input(s): "PROCALCITONIN", "LACTICIDVEN"  Microbiology: Recent Results (from the past 240 hour(s))  Resp panel by RT-PCR (RSV, Flu A&B, Covid) Anterior Nasal Swab     Status: None   Collection Time: 03/18/23 11:42 AM   Specimen: Anterior Nasal Swab  Result Value Ref Range Status   SARS Coronavirus 2 by RT PCR NEGATIVE NEGATIVE Final    Comment: (NOTE) SARS-CoV-2 target nucleic acids are NOT DETECTED.  The SARS-CoV-2 RNA is generally detectable in upper respiratory specimens during the acute phase of infection. The lowest concentration of SARS-CoV-2 viral copies this assay can detect is 138 copies/mL. A negative result does not preclude SARS-Cov-2 infection and should not be used as the sole basis for treatment or other patient management decisions. A negative result may occur with  improper specimen collection/handling, submission of specimen other than nasopharyngeal swab, presence of viral mutation(s) within the areas targeted by this assay, and inadequate number of viral copies(<138 copies/mL). A negative result must be combined with clinical observations, patient history, and epidemiological information. The expected result is Negative.  Fact Sheet for Patients:  BloggerCourse.com  Fact Sheet for Healthcare Providers:  SeriousBroker.it  This test is no t yet approved or cleared by the [place] FDA and  has been authorized for detection and/or diagnosis of SARS-CoV-2 by FDA under an Emergency Use Authorization (EUA). This EUA will remain  in effect (meaning this test can be used) for the duration of the COVID-19 declaration under  Section 564(b)(1) of the Act, 21 U.S.C.section 360bbb-3(b)(1), unless the authorization is terminated  or revoked sooner.       Influenza A by PCR NEGATIVE NEGATIVE Final   Influenza B by PCR NEGATIVE NEGATIVE Final    Comment: (NOTE) The Xpert Xpress SARS-CoV-2/FLU/RSV plus assay is intended as an aid in the diagnosis of influenza from Nasopharyngeal swab specimens and should not be used as a sole basis for treatment. Nasal washings and aspirates are unacceptable for Xpert Xpress SARS-CoV-2/FLU/RSV testing.  Fact Sheet for Patients: BloggerCourse.com  Fact Sheet for Healthcare Providers: SeriousBroker.it  This test is not yet approved or cleared by the [place] FDA and has been authorized for detection and/or diagnosis of SARS-CoV-2 by FDA under an Emergency Use Authorization (EUA). This EUA will remain in effect (meaning this test can be used) for the duration of the COVID-19 declaration under Section 564(b)(1) of the Act, 21 U.S.C. section 360bbb-3(b)(1), unless the authorization is terminated or revoked.     Resp Syncytial Virus by PCR NEGATIVE NEGATIVE Final    Comment: (NOTE) Fact Sheet for Patients: BloggerCourse.com  Fact Sheet for Healthcare Providers: SeriousBroker.it  This test is not yet approved or cleared by the [place] FDA and has been authorized for detection and/or diagnosis of SARS-CoV-2 by FDA under an Emergency Use Authorization (EUA). This EUA will remain in effect (meaning this test can be used) for the duration of the COVID-19 declaration under Section 564(b)(1) of the Act, 21 U.S.C. section 360bbb-3(b)(1), unless the authorization is terminated or revoked.  Performed at [facility], [address], [place], [place] [address]   Blood Culture (routine x 2)     Status: None (Preliminary result)   Collection Time:  03/18/23  1:34 PM   Specimen: BLOOD RIGHT ARM  Result Value Ref Range Status   Specimen Description   Final  BLOOD RIGHT ARM Performed at [facility], [address] [place], [place] [address]    Special Requests   Final    BOTTLES DRAWN AEROBIC AND ANAEROBIC Blood Culture adequate volume Performed at [facility], [address], [place], [place] [address]    Culture   Final    NO GROWTH 4 DAYS Performed at [facility], [address] [place], [place] [address]    Report Status PENDING  Incomplete  Blood Culture (routine x 2)     Status: None (Preliminary result)   Collection Time: 03/18/23  1:34 PM   Specimen: BLOOD RIGHT HAND  Result Value Ref Range Status   Specimen Description   Final    BLOOD RIGHT HAND Performed at [facility], [address], [place], [place] [address]    Special Requests   Final    BOTTLES DRAWN AEROBIC AND ANAEROBIC Blood Culture adequate volume Performed at [facility], [address] [place], [place] [address]    Culture   Final    NO GROWTH 4 DAYS Performed at [facility], [address], [place], [place] [address]    Report Status PENDING  Incomplete    Radiology Studies: DG Chest Port 1 View  Result Date: 03/22/2023 CLINICAL DATA:  Cough. EXAM: PORTABLE CHEST 1 VIEW COMPARISON:  Radiographs 03/18/2023 and 06/06/2021.  CT 03/18/2023. FINDINGS: 0643 hours. The heart size and mediastinal contours are stable. Aortic atherosclerosis and prominent mitral annular calcifications are noted. Patchy right upper lobe airspace disease has mildly increased. The patchy bibasilar opacities and small pleural effusions have not significantly changed. No evidence of pneumothorax. The bones appear unchanged. IMPRESSION: Mildly increased patchy right upper lobe airspace disease. No significant change in bibasilar airspace disease and small pleural effusions. Electronically Signed   By: Carey Bullocks M.D.   On: 03/22/2023 09:32      Francys Bolin T. Karema Tocci [facility]  If 7PM-7AM, please contact night-coverage [email or web address] 03/22/2023, 2:03 PM

## 2023-03-22 NOTE — Plan of Care (Signed)
  Problem: Clinical Measurements: Goal: Signs and symptoms of infection will decrease Outcome: Progressing   Problem: Respiratory: Goal: Ability to maintain adequate ventilation will improve Outcome: Progressing   Problem: Education: Goal: Knowledge of General Education information will improve Description: Including pain rating scale, medication(s)/side effects and non-pharmacologic comfort measures Outcome: Progressing   Problem: Nutrition: Goal: Adequate nutrition will be maintained Outcome: Progressing   Problem: Coping: Goal: Level of anxiety will decrease Outcome: Progressing

## 2023-03-23 DIAGNOSIS — Z7401 Bed confinement status: Secondary | ICD-10-CM | POA: Diagnosis not present

## 2023-03-23 DIAGNOSIS — J9601 Acute respiratory failure with hypoxia: Secondary | ICD-10-CM | POA: Diagnosis not present

## 2023-03-23 DIAGNOSIS — R6520 Severe sepsis without septic shock: Secondary | ICD-10-CM | POA: Diagnosis not present

## 2023-03-23 DIAGNOSIS — M6281 Muscle weakness (generalized): Secondary | ICD-10-CM | POA: Diagnosis not present

## 2023-03-23 DIAGNOSIS — F419 Anxiety disorder, unspecified: Secondary | ICD-10-CM | POA: Diagnosis not present

## 2023-03-23 DIAGNOSIS — R1312 Dysphagia, oropharyngeal phase: Secondary | ICD-10-CM | POA: Diagnosis not present

## 2023-03-23 DIAGNOSIS — E878 Other disorders of electrolyte and fluid balance, not elsewhere classified: Secondary | ICD-10-CM | POA: Diagnosis not present

## 2023-03-23 DIAGNOSIS — J302 Other seasonal allergic rhinitis: Secondary | ICD-10-CM | POA: Diagnosis not present

## 2023-03-23 DIAGNOSIS — J189 Pneumonia, unspecified organism: Secondary | ICD-10-CM | POA: Diagnosis not present

## 2023-03-23 DIAGNOSIS — I10 Essential (primary) hypertension: Secondary | ICD-10-CM | POA: Diagnosis not present

## 2023-03-23 DIAGNOSIS — N1832 Chronic kidney disease, stage 3b: Secondary | ICD-10-CM | POA: Diagnosis not present

## 2023-03-23 DIAGNOSIS — I129 Hypertensive chronic kidney disease with stage 1 through stage 4 chronic kidney disease, or unspecified chronic kidney disease: Secondary | ICD-10-CM | POA: Diagnosis not present

## 2023-03-23 DIAGNOSIS — R7303 Prediabetes: Secondary | ICD-10-CM | POA: Diagnosis not present

## 2023-03-23 DIAGNOSIS — R278 Other lack of coordination: Secondary | ICD-10-CM | POA: Diagnosis not present

## 2023-03-23 DIAGNOSIS — J9600 Acute respiratory failure, unspecified whether with hypoxia or hypercapnia: Secondary | ICD-10-CM | POA: Diagnosis not present

## 2023-03-23 DIAGNOSIS — I69398 Other sequelae of cerebral infarction: Secondary | ICD-10-CM | POA: Diagnosis not present

## 2023-03-23 DIAGNOSIS — R0902 Hypoxemia: Secondary | ICD-10-CM | POA: Diagnosis not present

## 2023-03-23 DIAGNOSIS — E871 Hypo-osmolality and hyponatremia: Secondary | ICD-10-CM | POA: Diagnosis not present

## 2023-03-23 DIAGNOSIS — Z66 Do not resuscitate: Secondary | ICD-10-CM | POA: Diagnosis not present

## 2023-03-23 DIAGNOSIS — D509 Iron deficiency anemia, unspecified: Secondary | ICD-10-CM | POA: Diagnosis not present

## 2023-03-23 DIAGNOSIS — I69891 Dysphagia following other cerebrovascular disease: Secondary | ICD-10-CM | POA: Diagnosis not present

## 2023-03-23 DIAGNOSIS — Z8673 Personal history of transient ischemic attack (TIA), and cerebral infarction without residual deficits: Secondary | ICD-10-CM | POA: Diagnosis not present

## 2023-03-23 DIAGNOSIS — R32 Unspecified urinary incontinence: Secondary | ICD-10-CM | POA: Diagnosis not present

## 2023-03-23 DIAGNOSIS — F32A Depression, unspecified: Secondary | ICD-10-CM | POA: Diagnosis not present

## 2023-03-23 DIAGNOSIS — F0390 Unspecified dementia without behavioral disturbance: Secondary | ICD-10-CM | POA: Diagnosis not present

## 2023-03-23 DIAGNOSIS — E785 Hyperlipidemia, unspecified: Secondary | ICD-10-CM | POA: Diagnosis not present

## 2023-03-23 DIAGNOSIS — G40909 Epilepsy, unspecified, not intractable, without status epilepticus: Secondary | ICD-10-CM | POA: Diagnosis not present

## 2023-03-23 DIAGNOSIS — Z1152 Encounter for screening for COVID-19: Secondary | ICD-10-CM | POA: Diagnosis not present

## 2023-03-23 DIAGNOSIS — K219 Gastro-esophageal reflux disease without esophagitis: Secondary | ICD-10-CM | POA: Diagnosis not present

## 2023-03-23 DIAGNOSIS — A419 Sepsis, unspecified organism: Secondary | ICD-10-CM | POA: Diagnosis not present

## 2023-03-23 DIAGNOSIS — E039 Hypothyroidism, unspecified: Secondary | ICD-10-CM | POA: Diagnosis not present

## 2023-03-23 DIAGNOSIS — M1990 Unspecified osteoarthritis, unspecified site: Secondary | ICD-10-CM | POA: Diagnosis not present

## 2023-03-23 DIAGNOSIS — R339 Retention of urine, unspecified: Secondary | ICD-10-CM | POA: Diagnosis not present

## 2023-03-23 LAB — CULTURE, BLOOD (ROUTINE X 2)
Culture: NO GROWTH
Culture: NO GROWTH
Special Requests: ADEQUATE
Special Requests: ADEQUATE

## 2023-03-23 LAB — CBC
HCT: 26.8 % — ABNORMAL LOW (ref 36.0–46.0)
Hemoglobin: 8.6 g/dL — ABNORMAL LOW (ref 12.0–15.0)
MCH: 32.1 pg (ref 26.0–34.0)
MCHC: 32.1 g/dL (ref 30.0–36.0)
MCV: 100 fL (ref 80.0–100.0)
Platelets: 326 10*3/uL (ref 150–400)
RBC: 2.68 MIL/uL — ABNORMAL LOW (ref 3.87–5.11)
RDW: 13.2 % (ref 11.5–15.5)
WBC: 15.5 10*3/uL — ABNORMAL HIGH (ref 4.0–10.5)
nRBC: 0 % (ref 0.0–0.2)

## 2023-03-23 LAB — RENAL FUNCTION PANEL
Albumin: 2.4 g/dL — ABNORMAL LOW (ref 3.5–5.0)
Anion gap: 8 (ref 5–15)
BUN: 19 mg/dL (ref 8–23)
CO2: 24 mmol/L (ref 22–32)
Calcium: 8.5 mg/dL — ABNORMAL LOW (ref 8.9–10.3)
Chloride: 103 mmol/L (ref 98–111)
Creatinine, Ser: 1.4 mg/dL — ABNORMAL HIGH (ref 0.44–1.00)
GFR, Estimated: 37 mL/min — ABNORMAL LOW (ref >60)
Glucose, Bld: 101 mg/dL — ABNORMAL HIGH (ref 70–99)
Phosphorus: 3.3 mg/dL (ref 2.5–4.6)
Potassium: 4 mmol/L (ref 3.5–5.1)
Sodium: 135 mmol/L (ref 135–145)

## 2023-03-23 LAB — MAGNESIUM: Magnesium: 1.7 mg/dL (ref 1.7–2.4)

## 2023-03-23 MED ORDER — GUAIFENESIN-DM 100-10 MG/5ML PO SYRP: 5.0000 mL | ORAL_SOLUTION | ORAL | Status: AC | PRN

## 2023-03-23 MED ORDER — DOXYCYCLINE HYCLATE 100 MG PO TABS: 100.0000 mg | ORAL_TABLET | Freq: Two times a day (BID) | ORAL | Status: AC

## 2023-03-23 MED ORDER — CHLORHEXIDINE GLUCONATE CLOTH 2 % EX PADS
6.0000 | MEDICATED_PAD | Freq: Every day | CUTANEOUS | Status: DC
  Administered 2023-03-23: 6 via TOPICAL

## 2023-03-23 MED ORDER — LIDOCAINE 5 % EX PTCH
1.0000 | MEDICATED_PATCH | Freq: Once | CUTANEOUS | Status: AC
  Administered 2023-03-23: 1 via TRANSDERMAL
  Filled 2023-03-23: qty 1

## 2023-03-23 NOTE — Plan of Care (Signed)
  Problem: Clinical Measurements: Goal: Signs and symptoms of infection will decrease Outcome: Progressing   Problem: Coping: Goal: Level of anxiety will decrease Outcome: Progressing   Problem: Pain Managment: Goal: General experience of comfort will improve Outcome: Progressing   Problem: Respiratory: Goal: Ability to maintain adequate ventilation will improve Outcome: Not Progressing   Problem: Activity: Goal: Risk for activity intolerance will decrease Outcome: Not Progressing

## 2023-03-23 NOTE — Plan of Care (Signed)
  Problem: Fluid Volume: Goal: Hemodynamic stability will improve Outcome: Completed/Met   Problem: Clinical Measurements: Goal: Diagnostic test results will improve Outcome: Completed/Met Goal: Signs and symptoms of infection will decrease Outcome: Completed/Met   Problem: Respiratory: Goal: Ability to maintain adequate ventilation will improve Outcome: Completed/Met   Problem: Education: Goal: Knowledge of General Education information will improve Description: Including pain rating scale, medication(s)/side effects and non-pharmacologic comfort measures Outcome: Completed/Met   Problem: Health Behavior/Discharge Planning: Goal: Ability to manage health-related needs will improve Outcome: Completed/Met   Problem: Clinical Measurements: Goal: Ability to maintain clinical measurements within normal limits will improve Outcome: Completed/Met Goal: Will remain free from infection Outcome: Completed/Met Goal: Diagnostic test results will improve Outcome: Completed/Met Goal: Respiratory complications will improve Outcome: Completed/Met Goal: Cardiovascular complication will be avoided Outcome: Completed/Met   Problem: Activity: Goal: Risk for activity intolerance will decrease Outcome: Completed/Met   Problem: Nutrition: Goal: Adequate nutrition will be maintained Outcome: Completed/Met   Problem: Coping: Goal: Level of anxiety will decrease Outcome: Completed/Met   Problem: Elimination: Goal: Will not experience complications related to bowel motility Outcome: Completed/Met Goal: Will not experience complications related to urinary retention Outcome: Completed/Met   Problem: Pain Managment: Goal: General experience of comfort will improve Outcome: Completed/Met   Problem: Safety: Goal: Ability to remain free from injury will improve Outcome: Completed/Met   Problem: Skin Integrity: Goal: Risk for impaired skin integrity will decrease Outcome:  Completed/Met

## 2023-03-23 NOTE — [place]2 (Signed)
[place] MEDICAID FL2 LEVEL OF CARE FORM     IDENTIFICATION  Patient Name: Carol Page Birthdate: 1935-09-20 Sex: female Admission Date (Current Location): 03/18/2023  [facility] [place] [facility] and [place] Number:  Producer, television/film/video and Address:  [facility],  [address], [place] [address]      Provider Number: [id number]  Attending Physician Name and Address:  Almon Hercules, MD  Relative Name and Phone Number:  Merceda Elks (Daughter)  [phone] [facility] Phone)    Current Level of Care: Hospital Recommended Level of Care: Skilled Nursing Facility Prior Approval Number:    Date Approved/Denied:   PASRR Number: [id number]  Discharge Plan: SNF    Current Diagnoses: Patient Active Problem List   Diagnosis Date Noted   Multifocal pneumonia 03/18/2023   Severe sepsis (HCC) 03/18/2023   Hyponatremia 03/18/2023   Dyslipidemia 03/18/2023   GERD without esophagitis 03/18/2023   Acute respiratory failure with hypoxia (HCC) 03/18/2023   AKI (acute kidney injury) (HCC) 06/04/2021   Hyperkalemia    Iron deficiency anemia 02/18/2021   Morbid obesity (HCC) 01/17/2021   Aortic atherosclerosis (HCC) 01/17/2021   CKD (chronic kidney disease) stage 3, GFR 30-59 ml/min (HCC) 01/08/2021   Anxiety and depression 10/06/2013   Urinary incontinence 10/06/2013   Chronic leg pain 10/06/2013   History of CVA (cerebrovascular accident) 10/06/2013   Essential hypertension 11/09/2012   HLD (hyperlipidemia) 11/09/2012   Pre-diabetes 11/09/2012   Seizure disorder (HCC) 11/09/2012    Orientation RESPIRATION BLADDER Height & Weight     Self, Situation  O2 (2L) Incontinent Weight: 225 lb (102.1 kg) Height:  5\' 4"  (162.6 cm)  BEHAVIORAL SYMPTOMS/MOOD NEUROLOGICAL BOWEL NUTRITION STATUS      Continent Diet  AMBULATORY STATUS COMMUNICATION OF NEEDS Skin   Extensive Assist Verbally Normal                       Personal Care Assistance Level of Assistance  Bathing, Feeding,  Dressing Bathing Assistance: Limited assistance Feeding assistance: Independent Dressing Assistance: Limited assistance     Functional Limitations Info  Sight, Hearing, Speech Sight Info: Adequate Hearing Info: Adequate Speech Info: Adequate    SPECIAL CARE FACTORS FREQUENCY  PT (By licensed PT), OT (By licensed OT)     PT Frequency: 5 x a week OT Frequency: 5 x a week            Contractures Contractures Info: Not present    Additional Factors Info  Code Status, Allergies Code Status Info: DNR-limited Allergies Info: Codeine,Ether           Current Medications (03/23/2023):  This is the current hospital active medication list Current Facility-Administered Medications  Medication Dose Route Frequency Provider Last Rate Last Admin   acetaminophen (TYLENOL) tablet 650 mg  650 mg Oral Q6H PRN Mansy, Jan A, MD   650 mg at 03/23/23 1059   Or   acetaminophen (TYLENOL) suppository 650 mg  650 mg Rectal Q6H PRN Mansy, Jan A, MD       atorvastatin (LIPITOR) tablet 80 mg  80 mg Oral Daily Mansy, Jan A, MD   80 mg at 03/22/23 0958   buPROPion [facility]) tablet 75 mg  75 mg Oral Daily Mansy, Jan A, MD   75 mg at 03/22/23 0959   chlorpheniramine-HYDROcodone (TUSSIONEX) 10-8 MG/5ML suspension 5 mL  5 mL Oral Q12H PRN Almon Hercules, MD   5 mL at 03/23/23 0237   cholecalciferol (VITAMIN D3) tablet 1,000 Units  1,000 Units Oral Daily Mansy, Jan A, MD   1,000 Units at 03/22/23 0958   clopidogrel (PLAVIX) tablet 75 mg  75 mg Oral QPM Mansy, Jan A, MD   75 mg at 03/22/23 1634   cyanocobalamin (VITAMIN B12) tablet 500 mcg  500 mcg Oral Daily Mansy, Jan A, MD   500 mcg at 03/22/23 0959   diltiazem (CARDIZEM CD) 24 hr capsule 180 mg  180 mg Oral Daily Mansy, Jan A, MD   180 mg at 03/22/23 0958   doxycycline (VIBRA-TABS) tablet 100 mg  100 mg Oral Q12H Gonfa, Taye T, MD   100 mg at 03/22/23 2125   enoxaparin (LOVENOX) injection 40 mg  40 mg Subcutaneous Q24H Candelaria Stagers T, MD   40 mg at  03/22/23 1634   escitalopram (LEXAPRO) tablet 5 mg  5 mg Oral Daily Mansy, Jan A, MD   5 mg at 03/22/23 0959   ferrous sulfate tablet 324 mg  324 mg Oral Q M,W,F Mansy, Jan A, MD   324 mg at 03/22/23 0958   guaiFENesin (MUCINEX) 12 hr tablet 600 mg  600 mg Oral BID Mansy, Jan A, MD   600 mg at 03/22/23 2125   guaiFENesin (ROBITUSSIN) 100 MG/5ML liquid 5 mL  5 mL Oral Q4H PRN Candelaria Stagers T, MD   5 mL at 03/22/23 0121   ipratropium-albuterol (DUONEB) 0.5-2.5 (3) MG/3ML nebulizer solution 3 mL  3 mL Nebulization Q6H PRN Mansy, Jan A, MD   3 mL at 03/21/23 1815   levothyroxine (SYNTHROID) tablet 50 mcg  50 mcg Oral Q0600 Mansy, Jan A, MD   50 mcg at 03/23/23 0547   lidocaine (LIDODERM) 5 % 1 patch  1 patch Transdermal Once Luiz Iron, NP   1 patch at 03/23/23 0237   loperamide (IMODIUM) capsule 2 mg  2 mg Oral Daily PRN Mansy, Jan A, MD       loratadine (CLARITIN) tablet 10 mg  10 mg Oral Daily Mansy, Jan A, MD   10 mg at 03/22/23 0958   mirabegron ER (MYRBETRIQ) tablet 25 mg  25 mg Oral Daily Mansy, Jan A, MD   25 mg at 03/22/23 0959   ondansetron (ZOFRAN) tablet 4 mg  4 mg Oral Q6H PRN Mansy, Jan A, MD       Or   ondansetron [facility]) injection 4 mg  4 mg Intravenous Q6H PRN Mansy, Jan A, MD       pantoprazole (PROTONIX) EC tablet 40 mg  40 mg Oral Daily Mansy, Jan A, MD   40 mg at 03/22/23 0959   polyethylene glycol (MIRALAX / GLYCOLAX) packet 17 g  17 g Oral Daily PRN Mansy, Jan A, MD       tamsulosin [facility]) capsule 0.4 mg  0.4 mg Oral Daily Mansy, Jan A, MD   0.4 mg at 03/22/23 6295   traZODone (DESYREL) tablet 25 mg  25 mg Oral QHS PRN Mansy, Jan A, MD   25 mg at 03/21/23 2125     Discharge Medications: Please see discharge summary for a list of discharge medications.  Relevant Imaging Results:  Relevant Lab Results:   Additional Information SSN :[id number]  Valentina Shaggy Unknown Flannigan, LCSW

## 2023-03-23 NOTE — Discharge Summary (Addendum)
Physician Discharge Summary  Carol Page ZOX:[id number] DOB: 10/16/1935 DOA: 03/18/2023  PCP: Pcp, No  Admit date: 03/18/2023 Discharge date: 03/23/2023 Admitted From: LTC Disposition:  SNF Recommendations for Outpatient Follow-up:   Continue encouraging incentive telemetry Check CMP and CBC in 1 week Voiding trial in 3 to 4 days. Please follow up on the following pending results: None   Discharge Condition: Stable CODE STATUS: DNR/DNI   Hospital course 87 year old F with PMH of CVA, prediabetes, HTN, HLD, CKD-3, anxiety, depression and morbid obesity presenting with increased shortness of breath, wheezing and dry cough for about 2 weeks, and admitted due to severe sepsis with hypoxic respiratory failure in the setting of community-acquired pneumonia.  She desaturated to 88% requiring 2 L to recover to mid 90s.  ABG reassuring. Cr 1.49 (about baseline).  BNP 85.  Serial troponin negative.  WBC 15.6 with left shift.  D-dimer elevated to 2.37.  CT angio negative for PE but multifocal pneumonia and dilated gallbladder in upper abdomen.  COVID-19, influenza and RSV PCR nonreactive.  Patient was started on ceftriaxone and azithromycin and admitted for further care.   Patient's respiratory symptoms improved.  Completed 5 days of ceftriaxone and Zithromax but had rising leukocytosis.  Started on doxycycline with improvement in leukocytosis.  Recheck CMP and CBC in 1 week.   Currently on room air. Continue encouraging incentive parameter.  With her body habitus and sedentary lifestyle, patient is at risk for extrinsic restrictive lung disease.  Patient had urinary retention with removal of 950 cc urine when Foley placed on the day of discharge.  She was on Myrbetriq which might be contributing.  Discontinued Myrbetriq.  Discharging patient with Foley catheter.  Recommend voiding trial in 3 to 4 days.   See individual problem list below for more.   Problems addressed during this  hospitalization Principal Problem:   Severe sepsis (HCC) Active Problems:   Multifocal pneumonia   Hyponatremia   Essential hypertension   Pre-diabetes   Anxiety and depression   History of CVA (cerebrovascular accident)   CKD (chronic kidney disease) stage 3, GFR 30-59 ml/min (HCC)   Morbid obesity (HCC)   Dyslipidemia   GERD without esophagitis   Acute respiratory failure with hypoxia (HCC)   Severe sepsis due to multifocal pneumonia: POA.  Has leukocytosis with left shift, tachypnea and respiratory failure on presentation.  CTA chest negative for PE but multifocal pneumonia.  Blood cultures NGTD.  CXR with increased RUL infiltrate.  Continues to endorse cough despite significant improvement.  BNP only 103.  RVP negative.  Pro-Cal back to normal.  Leukocytosis improving. -Completed 5 days of ceftriaxone -Changed Zithromax to doxycycline-discharged on doxycycline for 4 more days -Continue mucolytic's and antitussive -Discontinue lisinopril.   Acute respiratory failure with hypoxia: Reportedly desaturated to 88% and started on 2 L while in ED. resolved. -Patient is at risk for extrinsic restrictive lung disease due to body habitus and sedentary lifestyle -Encourage incentive spirometry -Manage pneumonia as above   Elevated D-dimer: CT angio chest negative for central PE.  LE venous Doppler negative for DVT -Continue VTE prophylaxis   History of CVA/hyperlipidemia: No focal neuro deficit. -Continue home Plavix and statin   Normocytic anemia: H&H relatively stable -Recheck CBC in 1 week  CKD-3B: Cr stable. -Discontinued lisinopril -Recheck in 1 week   LUTS/history of urinary incontinence/retention: Urinary tension with removal of 950 cc on 9/3. -Continue home Flomax  -Discontinue Myrbetriq due to acute urinary tension. -Voiding trial in 3 to 4  days.   Hyponatremia: Resolved   Essential hypertension: Normotensive -Continue home Cardizem   Hypomagnesemia: Resolved    Hypothyroidism -Continue home Synthroid.   GERD without esophagitis -Continue PPI   Anxiety and depression -Continue Wellbutrin XL.   Physical deconditioning: Reportedly bedbound for most part.  From LTC. -PT/OT  BLE edema/lymphedema: Stable   Morbid obesity Body mass index is 38.62 kg/m.            Time spent 35 minutes  Vital signs Vitals:   03/22/23 0956 03/22/23 1404 03/22/23 2001 03/23/23 0455  BP: 131/64 (!) 132/57 (!) 136/50 (!) 130/95  Pulse: 82 62 71 72  Temp: 98.1 F (36.7 C) 97.8 F (36.6 C) 97.6 F (36.4 C) 98 F (36.7 C)  Resp: 20 19 17 18   Height:      Weight:      SpO2: 98% 96% 96% 96%  TempSrc: Oral Oral Oral Oral  BMI (Calculated):         Discharge exam  GENERAL: No apparent distress.  Nontoxic. HEENT: MMM.  Vision and hearing grossly intact.  NECK: Supple.  No apparent JVD.  RESP:  No IWOB.  Fair aeration bilaterally but limited exam due to body habitus. CVS:  RRR. Heart sounds normal.  ABD/GI/GU: BS+. Abd soft, NTND.  MSK/EXT:  No apparent deformity.  1+ BLE edema.  BLE weakness. SKIN: no apparent skin lesion or wound NEURO: Awake and alert. Oriented appropriately.  No apparent focal neuro deficit. PSYCH: Calm. Normal affect.   Discharge Instructions Discharge Instructions     Diet - low sodium heart healthy   Complete by: As directed    Increase activity slowly   Complete by: As directed       Allergies as of 03/23/2023       Reactions   Codeine Nausea And Vomiting   Ether Nausea And Vomiting        Medication List     STOP taking these medications    lisinopril 10 MG tablet Commonly known as: ZESTRIL   magnesium oxide 400 (240 Mg) MG tablet Commonly known as: MAG-OX   Myrbetriq 25 MG Tb24 tablet Generic drug: mirabegron ER       TAKE these medications    acetaminophen 325 MG tablet Commonly known as: TYLENOL Take 650 mg by mouth every 6 (six) hours as needed for moderate pain. What changed: Another  medication with the same name was removed. Continue taking this medication, and follow the directions you see here.   atorvastatin 80 MG tablet Commonly known as: LIPITOR Take 1 tablet by mouth once daily   benzonatate 100 MG capsule Commonly known as: TESSALON Take 100 mg by mouth 3 (three) times daily as needed for cough.   buPROPion 75 MG tablet Commonly known as: WELLBUTRIN Take 75 mg by mouth daily.   clopidogrel 75 MG tablet Commonly known as: PLAVIX Take 1 tablet by mouth in the evening   diltiazem 180 MG 24 hr capsule Commonly known as: CARDIZEM CD Take 1 capsule by mouth once daily   doxycycline 100 MG tablet Commonly known as: VIBRA-TABS Take 1 tablet (100 mg total) by mouth every 12 (twelve) hours for 4 days.   escitalopram 5 MG tablet Commonly known as: LEXAPRO Take 5 mg by mouth daily.   ferrous sulfate 324 (65 Fe) MG Tbec Take 324 mg by mouth every Monday, Wednesday, and Friday.   guaiFENesin-dextromethorphan 100-10 MG/5ML syrup Commonly known as: ROBITUSSIN DM Take 5 mLs by mouth every 4 (  four) hours as needed for up to 5 days for cough.   Krill Oil 500 MG Caps Take 500 mg by mouth in the morning and at bedtime.   levothyroxine 50 MCG tablet Commonly known as: SYNTHROID Take 50 mcg by mouth daily before breakfast.   loperamide 2 MG tablet Commonly known as: IMODIUM A-D Take 2 mg by mouth daily as needed (After first loose stool as needed.).   loratadine 10 MG tablet Commonly known as: CLARITIN Take 10 mg by mouth daily.   pantoprazole 40 MG tablet Commonly known as: PROTONIX Take 1 tablet by mouth once daily   polyethylene glycol 17 g packet Commonly known as: MIRALAX / GLYCOLAX Take 17 g by mouth daily as needed for mild constipation.   tamsulosin 0.4 MG Caps capsule Commonly known as: FLOMAX Take 1 capsule (0.4 mg total) by mouth daily.   vitamin B-12 500 MCG tablet Commonly known as: CYANOCOBALAMIN Take 500 mcg by mouth daily.    Vitamin D-3 25 MCG (1000 UT) Caps Take 1,000 Units by mouth daily.        Consultations: None  Procedures/Studies:   DG Chest Port 1 View  Result Date: 03/22/2023 CLINICAL DATA:  Cough. EXAM: PORTABLE CHEST 1 VIEW COMPARISON:  Radiographs 03/18/2023 and 06/06/2021.  CT 03/18/2023. FINDINGS: 0643 hours. The heart size and mediastinal contours are stable. Aortic atherosclerosis and prominent mitral annular calcifications are noted. Patchy right upper lobe airspace disease has mildly increased. The patchy bibasilar opacities and small pleural effusions have not significantly changed. No evidence of pneumothorax. The bones appear unchanged. IMPRESSION: Mildly increased patchy right upper lobe airspace disease. No significant change in bibasilar airspace disease and small pleural effusions. Electronically Signed   By: Carey Bullocks M.D.   On: 03/22/2023 09:32   VAS [place] LOWER EXTREMITY VENOUS (DVT)  Result Date: 03/19/2023  Lower Venous DVT Study Patient Name:  MORGANE MAFFETT  Date of Exam:   03/19/2023 Medical Rec #: [id number]       Accession #:    [id number] Date of Birth: [birth year]        Patient Gender: F Patient Age:   69 years Exam Location:  [facility] Procedure:      VAS [place] LOWER EXTREMITY VENOUS (DVT) Referring Phys: Alwyn Ren Juliani Laduke --------------------------------------------------------------------------------  Indications: Elevated Ddimer.  Risk Factors: None identified. Limitations: Body habitus, poor ultrasound/tissue interface and patient pain tolerance. Comparison Study: No prior studies. Performing Technologist: Chanda Busing RVT  Examination Guidelines: A complete evaluation includes B-mode imaging, spectral Doppler, color Doppler, and power Doppler as needed of all accessible portions of each vessel. Bilateral testing is considered an integral part of a complete examination. Limited examinations for reoccurring indications may be performed as noted. The reflux portion of the  exam is performed with the patient in reverse Trendelenburg.  +---------+---------------+---------+-----------+----------+-------------------+ RIGHT    CompressibilityPhasicitySpontaneityPropertiesThrombus Aging      +---------+---------------+---------+-----------+----------+-------------------+ CFV      Full           Yes      Yes                                      +---------+---------------+---------+-----------+----------+-------------------+ SFJ      Full                                                             +---------+---------------+---------+-----------+----------+-------------------+  FV Prox  Full                                                             +---------+---------------+---------+-----------+----------+-------------------+ FV Mid                  Yes      Yes                                      +---------+---------------+---------+-----------+----------+-------------------+ FV Distal               Yes      Yes                                      +---------+---------------+---------+-----------+----------+-------------------+ PFV      Full                                                             +---------+---------------+---------+-----------+----------+-------------------+ POP      Full           Yes      Yes                                      +---------+---------------+---------+-----------+----------+-------------------+ PTV      Full                                                             +---------+---------------+---------+-----------+----------+-------------------+ PERO                                                  Not well visualized +---------+---------------+---------+-----------+----------+-------------------+   +---------+---------------+---------+-----------+----------+-------------------+ LEFT     CompressibilityPhasicitySpontaneityPropertiesThrombus Aging       +---------+---------------+---------+-----------+----------+-------------------+ CFV      Full           Yes      Yes                                      +---------+---------------+---------+-----------+----------+-------------------+ SFJ      Full                                                             +---------+---------------+---------+-----------+----------+-------------------+ FV Prox  Full                                                             +---------+---------------+---------+-----------+----------+-------------------+  FV Mid                  Yes      Yes                                      +---------+---------------+---------+-----------+----------+-------------------+ FV Distal               Yes      Yes                                      +---------+---------------+---------+-----------+----------+-------------------+ PFV      Full                                                             +---------+---------------+---------+-----------+----------+-------------------+ POP      Full           Yes      Yes                                      +---------+---------------+---------+-----------+----------+-------------------+ PTV      Full                                                             +---------+---------------+---------+-----------+----------+-------------------+ PERO                                                  Not well visualized +---------+---------------+---------+-----------+----------+-------------------+     Summary: RIGHT: - There is no evidence of deep vein thrombosis in the lower extremity. However, portions of this examination were limited- see technologist comments above.  - No cystic structure found in the popliteal fossa.  LEFT: - There is no evidence of deep vein thrombosis in the lower extremity. However, portions of this examination were limited- see technologist comments above.  - No cystic  structure found in the popliteal fossa.  *See table(s) above for measurements and observations. Electronically signed by Lemar Livings MD on 03/19/2023 at 7:09:31 PM.    Final    CT Angio Chest PE W and/or Wo Contrast  Result Date: 03/18/2023 CLINICAL DATA:  Cough and shortness of breath EXAM: CT ANGIOGRAPHY CHEST WITH CONTRAST TECHNIQUE: Multidetector CT imaging of the chest was performed using the standard protocol during bolus administration of intravenous contrast. Multiplanar CT image reconstructions and MIPs were obtained to evaluate the vascular anatomy. RADIATION DOSE REDUCTION: This exam was performed according to the departmental dose-optimization program which includes automated exposure control, adjustment of the mA and/or kV according to patient size and/or use of iterative reconstruction technique. CONTRAST:  50mL OMNIPAQUE IOHEXOL 350 MG/ML SOLN COMPARISON:  Chest x-ray 03/18/2023 FINDINGS: Cardiovascular: Borderline size heart. No pericardial effusion. Significant calcifications along the coronary arteries and  mitral valve annulus. The thoracic aorta has some scattered vascular calcifications. There also calcifications in the area of the aortic valve. There is significant motion seen throughout the examination. This limits evaluation for pulmonary emboli, nondiagnostic for small and peripheral emboli. No segmental or larger embolus identified at this time. Some enlargement of the main pulmonary arteries. Please correlate for evidence of pulmonary artery hypertension. Mediastinum/Nodes: Patulous air-filled esophagus with a small hiatal hernia. Thyroid gland is mildly atrophic. There is no specific abnormal lymph node enlargement identified in the axillary regions. There are some enlarged hilar nodes. Example on the right on series 10 image 171 measures 18 by 13 mm. Left-sided example measures 14 by 12 mm on series 10, image 169. There are some prominent but smaller mediastinal nodes as well.  Lungs/Pleura: Extensive breathing motion. Small pleural effusions. Multifocal consolidative lung opacities identified particularly along the lower lobes and dependent upper lobes. There is also some patchy areas of nodular ground-glass with interstitial septal thickening. Please correlate for an acute inflammatory or infectious process. No pneumothorax. Recommend follow-up. Upper Abdomen: Adrenal glands are preserved in the upper abdomen. There is dilated gallbladder. Please correlate with any particular symptoms and if needed further workup with ultrasound. Musculoskeletal: Osteopenia. Scattered degenerative changes of the spine with osteophytes and Schmorl's nodes. Review of the MIP images confirms the above findings. IMPRESSION: Significant breathing motion limiting evaluation. No segmental or larger pulmonary embolism. Multifocal parenchymal lung opacities with a ground-glass components and interstitial thickening. Is also small pleural effusions. Acute infiltrative process is possible such as pneumonia. Recommend follow-up to confirm clearance. Few borderline to enlarged mediastinal and hilar lymph nodes. These could be reactive and attention on follow up as well. Dilated gallbladder in the upper abdomen. Please correlate with any symptoms and if needed further workup with ultrasound as the next step in the workup. Small hiatal hernia with patulous esophagus. Aortic Atherosclerosis (ICD10-I70.0). Electronically Signed   By: Karen Kays M.D.   On: 03/18/2023 15:10   DG Chest Portable 1 View  Result Date: 03/18/2023 CLINICAL DATA:  Cough, shortness of breath. EXAM: PORTABLE CHEST 1 VIEW COMPARISON:  June 06, 2021. FINDINGS: Stable cardiomediastinal silhouette. Probable atelectasis or scarring seen in right upper lobe and midlung. Minimal bibasilar subsegmental atelectasis or scarring is noted. Bony thorax is unremarkable. IMPRESSION: Probable atelectasis or scarring is noted in right lung as noted above.  Minimal bibasilar subsegmental atelectasis or scarring. Electronically Signed   By: Lupita Raider M.D.   On: 03/18/2023 11:55       The results of significant diagnostics from this hospitalization (including imaging, microbiology, ancillary and laboratory) are listed below for reference.     Microbiology: Recent Results (from the past 240 hour(s))  Resp panel by RT-PCR (RSV, Flu A&B, Covid) Anterior Nasal Swab     Status: None   Collection Time: 03/18/23 11:42 AM   Specimen: Anterior Nasal Swab  Result Value Ref Range Status   SARS Coronavirus 2 by RT PCR NEGATIVE NEGATIVE Final    Comment: (NOTE) SARS-CoV-2 target nucleic acids are NOT DETECTED.  The SARS-CoV-2 RNA is generally detectable in upper respiratory specimens during the acute phase of infection. The lowest concentration of SARS-CoV-2 viral copies this assay can detect is 138 copies/mL. A negative result does not preclude SARS-Cov-2 infection and should not be used as the sole basis for treatment or other patient management decisions. A negative result may occur with  improper specimen collection/handling, submission of specimen other than nasopharyngeal swab, presence  of viral mutation(s) within the areas targeted by this assay, and inadequate number of viral copies(<138 copies/mL). A negative result must be combined with clinical observations, patient history, and epidemiological information. The expected result is Negative.  Fact Sheet for Patients:  BloggerCourse.com  Fact Sheet for Healthcare Providers:  SeriousBroker.it  This test is no t yet approved or cleared by the [place] FDA and  has been authorized for detection and/or diagnosis of SARS-CoV-2 by FDA under an Emergency Use Authorization (EUA). This EUA will remain  in effect (meaning this test can be used) for the duration of the COVID-19 declaration under Section 564(b)(1) of the Act,  21 U.S.C.section 360bbb-3(b)(1), unless the authorization is terminated  or revoked sooner.       Influenza A by PCR NEGATIVE NEGATIVE Final   Influenza B by PCR NEGATIVE NEGATIVE Final    Comment: (NOTE) The Xpert Xpress SARS-CoV-2/FLU/RSV plus assay is intended as an aid in the diagnosis of influenza from Nasopharyngeal swab specimens and should not be used as a sole basis for treatment. Nasal washings and aspirates are unacceptable for Xpert Xpress SARS-CoV-2/FLU/RSV testing.  Fact Sheet for Patients: BloggerCourse.com  Fact Sheet for Healthcare Providers: SeriousBroker.it  This test is not yet approved or cleared by the [place] FDA and has been authorized for detection and/or diagnosis of SARS-CoV-2 by FDA under an Emergency Use Authorization (EUA). This EUA will remain in effect (meaning this test can be used) for the duration of the COVID-19 declaration under Section 564(b)(1) of the Act, 21 U.S.C. section 360bbb-3(b)(1), unless the authorization is terminated or revoked.     Resp Syncytial Virus by PCR NEGATIVE NEGATIVE Final    Comment: (NOTE) Fact Sheet for Patients: BloggerCourse.com  Fact Sheet for Healthcare Providers: SeriousBroker.it  This test is not yet approved or cleared by the [place] FDA and has been authorized for detection and/or diagnosis of SARS-CoV-2 by FDA under an Emergency Use Authorization (EUA). This EUA will remain in effect (meaning this test can be used) for the duration of the COVID-19 declaration under Section 564(b)(1) of the Act, 21 U.S.C. section 360bbb-3(b)(1), unless the authorization is terminated or revoked.  Performed at [facility], [address] [place], [place] [address]   Blood Culture (routine x 2)     Status: None   Collection Time: 03/18/23  1:34 PM   Specimen: BLOOD RIGHT ARM  Result  Value Ref Range Status   Specimen Description   Final    BLOOD RIGHT ARM Performed at [facility], [address] [place], [place] [address]    Special Requests   Final    BOTTLES DRAWN AEROBIC AND ANAEROBIC Blood Culture adequate volume Performed at [facility], [address], [place], [place] [address]    Culture   Final    NO GROWTH 5 DAYS Performed at [facility], [address] [place], [place] [address]    Report Status 03/23/2023 FINAL  Final  Blood Culture (routine x 2)     Status: None   Collection Time: 03/18/23  1:34 PM   Specimen: BLOOD RIGHT HAND  Result Value Ref Range Status   Specimen Description   Final    BLOOD RIGHT HAND Performed at [facility], [address], [place], [place] [address]    Special Requests   Final    BOTTLES DRAWN AEROBIC AND ANAEROBIC Blood Culture adequate volume Performed at [facility], [address] [place], [place] [address]    Culture   Final    NO GROWTH 5 DAYS Performed at [facility], [address] [place], [place] [address]    Report Status 03/23/2023 FINAL  Final  Respiratory (~20 pathogens) panel by PCR     Status: None   Collection Time: 03/22/23 10:27 AM   Specimen: Nasopharyngeal Swab; Respiratory  Result Value Ref Range Status   Adenovirus NOT DETECTED NOT DETECTED Final   Coronavirus 229E NOT DETECTED NOT DETECTED Final    Comment: (NOTE) The Coronavirus on the Respiratory Panel, DOES NOT test for the novel  Coronavirus (2019 nCoV)    Coronavirus HKU1 NOT DETECTED NOT DETECTED Final   Coronavirus NL63 NOT DETECTED NOT DETECTED Final   Coronavirus OC43 NOT DETECTED NOT DETECTED Final   Metapneumovirus NOT DETECTED NOT DETECTED Final   Rhinovirus / Enterovirus NOT DETECTED NOT DETECTED Final   Influenza A NOT DETECTED NOT DETECTED Final   Influenza B NOT DETECTED NOT DETECTED Final   Parainfluenza Virus 1 NOT DETECTED NOT DETECTED Final    Parainfluenza Virus 2 NOT DETECTED NOT DETECTED Final   Parainfluenza Virus 3 NOT DETECTED NOT DETECTED Final   Parainfluenza Virus 4 NOT DETECTED NOT DETECTED Final   Respiratory Syncytial Virus NOT DETECTED NOT DETECTED Final   Bordetella pertussis NOT DETECTED NOT DETECTED Final   Bordetella Parapertussis NOT DETECTED NOT DETECTED Final   Chlamydophila pneumoniae NOT DETECTED NOT DETECTED Final   Mycoplasma pneumoniae NOT DETECTED NOT DETECTED Final    Comment: Performed at [facility], [address], [place], [place] [address]     Labs:  CBC: Recent Labs  Lab 03/18/23 1142 03/19/23 0431 03/20/23 0451 03/21/23 0940 03/22/23 0854 03/23/23 0418  WBC 13.6* 13.6* 14.5* 16.8* 17.1* 15.5*  NEUTROABS 10.4*  --   --   --  12.0*  --   HGB 9.0* 8.5* 9.0* 8.9* 8.7* 8.6*  HCT 26.3* 26.4* 28.8* 27.1* 26.7* 26.8*  MCV 95.3 97.8 99.7 97.5 97.8 100.0  PLT 272 281 296 337 324 326   BMP &GFR Recent Labs  Lab 03/19/23 0431 03/20/23 0451 03/21/23 0940 03/22/23 0854 03/23/23 0418  NA 132* 134* 130* 133* 135  K 4.0 4.3 3.6 3.9 4.0  CL 101 100 98 101 103  CO2 23 23 23 25 24   GLUCOSE 86 72 115* 90 101*  BUN 28* 30* 27* [age]  CREATININE 1.43* 1.69* 1.44* 1.25* 1.40*  CALCIUM 8.0* 8.6* 8.2* 8.2* 8.5*  MG  --  1.8 1.6* 1.8 1.7  PHOS  --  3.8 2.9 3.0 3.3   Estimated Creatinine Clearance: 34.2 mL/min (A) (by C-G formula based on SCr of 1.4 mg/dL (H)). Liver & Pancreas: Recent Labs  Lab 03/18/23 1142 03/20/23 0451 03/21/23 0940 03/22/23 0854 03/23/23 0418  AST 17  --   --   --   --   ALT 16  --   --   --   --   ALKPHOS 78  --   --   --   --   BILITOT 0.7  --   --   --   --   PROT 5.9*  --   --   --   --   ALBUMIN 2.7* 2.6* 2.6* 2.5* 2.4*   Recent Labs  Lab 03/18/23 1142  LIPASE 22   No results for input(s): "AMMONIA" in the last 168 hours. Diabetic: No results for input(s): "HGBA1C" in the last 72  hours. No results for input(s): "GLUCAP" in the last 168  hours. Cardiac Enzymes: No results for input(s): "CKTOTAL", "CKMB", "CKMBINDEX", "TROPONINI" in the last 168 hours. No results for input(s): "PROBNP" in the last 8760 hours. Coagulation Profile: Recent Labs  Lab 03/18/23 1334 03/19/23 0431  INR 1.3* 1.2   Thyroid Function Tests: No results for input(s): "TSH", "T4TOTAL", "FREET4", "T3FREE", "THYROIDAB" in the last 72 hours. Lipid Profile: No results for input(s): "CHOL", "HDL", "LDLCALC", "TRIG", "CHOLHDL", "LDLDIRECT" in the last 72 hours. Anemia Panel: No results for input(s): "VITAMINB12", "FOLATE", "FERRITIN", "TIBC", "IRON", "RETICCTPCT" in the last 72 hours. Urine analysis:    Component Value Date/Time   COLORURINE YELLOW 03/19/2023 2212   APPEARANCEUR CLOUDY (A) 03/19/2023 2212   LABSPEC 1.039 (H) 03/19/2023 2212   PHURINE 5.0 03/19/2023 2212   GLUCOSEU NEGATIVE 03/19/2023 2212   GLUCOSEU NEGATIVE 11/09/2012 1537   HGBUR NEGATIVE 03/19/2023 2212   BILIRUBINUR NEGATIVE 03/19/2023 2212   KETONESUR NEGATIVE 03/19/2023 2212   PROTEINUR NEGATIVE 03/19/2023 2212   UROBILINOGEN 1.0 05/04/2015 1505   NITRITE NEGATIVE 03/19/2023 2212   LEUKOCYTESUR LARGE (A) 03/19/2023 2212   Sepsis Labs: Invalid input(s): "PROCALCITONIN", "LACTICIDVEN"   SIGNED:  Almon Hercules, MD  [facility] 03/23/2023, 1:58 PM

## 2023-03-23 NOTE — Progress Notes (Signed)
Patient received discharge orders to return back to [facility]. Report was given to the RN over at [facility] who will be resuming patient's care. All discharge paperwork/instructions were placed in patient's discharge packet. [facility] was called to pick patient up to take patient to [facility].   While waiting for [facility], RN bladder scanned patient since patient had not urinated in 24 hours, which showed 634 mL in patient's bladder. MD was notified. MD placed an order for Foley catheter to be inserted. Once Foley catheter was inserted, patient had put out at least 950 mL of urinary output. MD told RN to leave the Foley catheter in if the Foley catheter drained more than 600 mL. Foley catheter left in place and patient will leave with Foley catheter upon discharge to [facility]. RN gave receiving RN at [facility] that patient will be coming to the facility with a Foley catheter due to urinary retention.  [facility] finally arrived to pick patient up to take patient to [facility]. Patient left the hospital stable with Foley catheter in place, had discharge paperwork, and had all personal belongings. Patient's daughter was called to let her know that patient had been picked up by [facility] for transport to [facility].

## 2023-03-23 NOTE — TOC Transition Note (Signed)
Transition of Care [facility]) - CM/SW Discharge Note   Patient Details  Name: Carol Page MRN: [id number] Date of Birth: Jul 07, 1936  [facility] CM/SW Contact:  Larrie Kass, LCSW Phone Number: 03/23/2023, 11:13 AM   Clinical Narrative:    Pt to return back to [facility]. CSW spoke with pt's daughter to Inform her of d/c. Pt's room assignment 307 call report [phone]. [facility] called, TOC sign off.   Final next level of care: Long Term Nursing Home Barriers to Discharge: No Barriers Identified   Patient Goals and CMS Choice CMS Medicare.gov Compare Post Acute Care list provided to:: Patient Choice offered to / list presented to : Patient  Discharge Placement                    Name of family member notified: Merceda Elks (Daughter)  [phone] (Home Phone Patient and family notified of of transfer: 03/23/23  Discharge Plan and Services Additional resources added to the After Visit Summary for                                       Social Determinants of Health (SDOH) Interventions SDOH Screenings   Food Insecurity: No Food Insecurity (03/18/2023)  Housing: Low Risk  (03/18/2023)  Transportation Needs: No Transportation Needs (03/18/2023)  Utilities: Not At Risk (03/18/2023)  Alcohol Screen: Low Risk  (01/16/2021)  Depression (PHQ2-9): High Risk (04/30/2021)  Tobacco Use: Low Risk  (03/18/2023)     Readmission Risk Interventions     No data to display

## 2023-03-24 DIAGNOSIS — R339 Retention of urine, unspecified: Secondary | ICD-10-CM | POA: Diagnosis not present

## 2023-03-24 DIAGNOSIS — J9601 Acute respiratory failure with hypoxia: Secondary | ICD-10-CM | POA: Diagnosis not present

## 2023-03-24 DIAGNOSIS — E785 Hyperlipidemia, unspecified: Secondary | ICD-10-CM | POA: Diagnosis not present

## 2023-03-24 DIAGNOSIS — J189 Pneumonia, unspecified organism: Secondary | ICD-10-CM | POA: Diagnosis not present

## 2023-03-25 DIAGNOSIS — M6281 Muscle weakness (generalized): Secondary | ICD-10-CM | POA: Diagnosis not present

## 2023-03-25 DIAGNOSIS — J189 Pneumonia, unspecified organism: Secondary | ICD-10-CM | POA: Diagnosis not present

## 2023-03-25 DIAGNOSIS — F32A Depression, unspecified: Secondary | ICD-10-CM | POA: Diagnosis not present

## 2023-03-25 DIAGNOSIS — R278 Other lack of coordination: Secondary | ICD-10-CM | POA: Diagnosis not present

## 2023-03-29 DIAGNOSIS — J189 Pneumonia, unspecified organism: Secondary | ICD-10-CM | POA: Diagnosis not present

## 2023-03-29 DIAGNOSIS — F32A Depression, unspecified: Secondary | ICD-10-CM | POA: Diagnosis not present

## 2023-03-29 DIAGNOSIS — M6281 Muscle weakness (generalized): Secondary | ICD-10-CM | POA: Diagnosis not present

## 2023-03-29 DIAGNOSIS — R278 Other lack of coordination: Secondary | ICD-10-CM | POA: Diagnosis not present

## 2023-04-01 DIAGNOSIS — J189 Pneumonia, unspecified organism: Secondary | ICD-10-CM | POA: Diagnosis not present

## 2023-04-01 DIAGNOSIS — M6281 Muscle weakness (generalized): Secondary | ICD-10-CM | POA: Diagnosis not present

## 2023-04-01 DIAGNOSIS — R278 Other lack of coordination: Secondary | ICD-10-CM | POA: Diagnosis not present

## 2023-04-01 DIAGNOSIS — F32A Depression, unspecified: Secondary | ICD-10-CM | POA: Diagnosis not present

## 2023-04-05 DIAGNOSIS — J189 Pneumonia, unspecified organism: Secondary | ICD-10-CM | POA: Diagnosis not present

## 2023-04-05 DIAGNOSIS — R278 Other lack of coordination: Secondary | ICD-10-CM | POA: Diagnosis not present

## 2023-04-05 DIAGNOSIS — M6281 Muscle weakness (generalized): Secondary | ICD-10-CM | POA: Diagnosis not present

## 2023-04-05 DIAGNOSIS — F32A Depression, unspecified: Secondary | ICD-10-CM | POA: Diagnosis not present

## 2023-04-08 DIAGNOSIS — M6281 Muscle weakness (generalized): Secondary | ICD-10-CM | POA: Diagnosis not present

## 2023-04-08 DIAGNOSIS — R278 Other lack of coordination: Secondary | ICD-10-CM | POA: Diagnosis not present

## 2023-04-08 DIAGNOSIS — F32A Depression, unspecified: Secondary | ICD-10-CM | POA: Diagnosis not present

## 2023-04-08 DIAGNOSIS — J189 Pneumonia, unspecified organism: Secondary | ICD-10-CM | POA: Diagnosis not present

## 2023-04-10 DIAGNOSIS — I69891 Dysphagia following other cerebrovascular disease: Secondary | ICD-10-CM | POA: Diagnosis not present

## 2023-04-10 DIAGNOSIS — J189 Pneumonia, unspecified organism: Secondary | ICD-10-CM | POA: Diagnosis not present

## 2023-04-10 DIAGNOSIS — E039 Hypothyroidism, unspecified: Secondary | ICD-10-CM | POA: Diagnosis not present

## 2023-04-10 DIAGNOSIS — I69398 Other sequelae of cerebral infarction: Secondary | ICD-10-CM | POA: Diagnosis not present

## 2023-04-12 DIAGNOSIS — R278 Other lack of coordination: Secondary | ICD-10-CM | POA: Diagnosis not present

## 2023-04-12 DIAGNOSIS — J189 Pneumonia, unspecified organism: Secondary | ICD-10-CM | POA: Diagnosis not present

## 2023-04-12 DIAGNOSIS — M6281 Muscle weakness (generalized): Secondary | ICD-10-CM | POA: Diagnosis not present

## 2023-04-12 DIAGNOSIS — F32A Depression, unspecified: Secondary | ICD-10-CM | POA: Diagnosis not present

## 2023-04-15 DIAGNOSIS — M6281 Muscle weakness (generalized): Secondary | ICD-10-CM | POA: Diagnosis not present

## 2023-04-15 DIAGNOSIS — F32A Depression, unspecified: Secondary | ICD-10-CM | POA: Diagnosis not present

## 2023-04-15 DIAGNOSIS — R278 Other lack of coordination: Secondary | ICD-10-CM | POA: Diagnosis not present

## 2023-04-15 DIAGNOSIS — J189 Pneumonia, unspecified organism: Secondary | ICD-10-CM | POA: Diagnosis not present

## 2023-04-16 DIAGNOSIS — K219 Gastro-esophageal reflux disease without esophagitis: Secondary | ICD-10-CM | POA: Diagnosis not present

## 2023-04-19 DIAGNOSIS — F32A Depression, unspecified: Secondary | ICD-10-CM | POA: Diagnosis not present

## 2023-04-19 DIAGNOSIS — M6281 Muscle weakness (generalized): Secondary | ICD-10-CM | POA: Diagnosis not present

## 2023-04-19 DIAGNOSIS — J189 Pneumonia, unspecified organism: Secondary | ICD-10-CM | POA: Diagnosis not present

## 2023-04-19 DIAGNOSIS — R278 Other lack of coordination: Secondary | ICD-10-CM | POA: Diagnosis not present

## 2023-04-28 DIAGNOSIS — I129 Hypertensive chronic kidney disease with stage 1 through stage 4 chronic kidney disease, or unspecified chronic kidney disease: Secondary | ICD-10-CM | POA: Diagnosis not present

## 2023-04-28 DIAGNOSIS — K219 Gastro-esophageal reflux disease without esophagitis: Secondary | ICD-10-CM | POA: Diagnosis not present

## 2023-04-28 DIAGNOSIS — E039 Hypothyroidism, unspecified: Secondary | ICD-10-CM | POA: Diagnosis not present

## 2023-04-28 DIAGNOSIS — F32A Depression, unspecified: Secondary | ICD-10-CM | POA: Diagnosis not present

## 2023-05-08 DIAGNOSIS — R8290 Unspecified abnormal findings in urine: Secondary | ICD-10-CM | POA: Diagnosis not present

## 2023-05-08 DIAGNOSIS — N390 Urinary tract infection, site not specified: Secondary | ICD-10-CM | POA: Diagnosis not present

## 2023-05-08 DIAGNOSIS — R400 Somnolence: Secondary | ICD-10-CM | POA: Diagnosis not present

## 2023-05-14 DIAGNOSIS — N3081 Other cystitis with hematuria: Secondary | ICD-10-CM | POA: Diagnosis not present

## 2023-05-14 DIAGNOSIS — E785 Hyperlipidemia, unspecified: Secondary | ICD-10-CM | POA: Diagnosis not present

## 2023-05-14 DIAGNOSIS — R051 Acute cough: Secondary | ICD-10-CM | POA: Diagnosis not present

## 2023-05-14 DIAGNOSIS — R5383 Other fatigue: Secondary | ICD-10-CM | POA: Diagnosis not present

## 2023-05-14 DIAGNOSIS — R062 Wheezing: Secondary | ICD-10-CM | POA: Diagnosis not present

## 2023-05-14 DIAGNOSIS — R0989 Other specified symptoms and signs involving the circulatory and respiratory systems: Secondary | ICD-10-CM | POA: Diagnosis not present

## 2023-05-17 DIAGNOSIS — J181 Lobar pneumonia, unspecified organism: Secondary | ICD-10-CM | POA: Diagnosis not present

## 2023-05-17 DIAGNOSIS — N3081 Other cystitis with hematuria: Secondary | ICD-10-CM | POA: Diagnosis not present

## 2023-05-22 DIAGNOSIS — N3081 Other cystitis with hematuria: Secondary | ICD-10-CM | POA: Diagnosis not present

## 2023-05-22 DIAGNOSIS — R011 Cardiac murmur, unspecified: Secondary | ICD-10-CM | POA: Diagnosis not present

## 2023-05-22 DIAGNOSIS — E871 Hypo-osmolality and hyponatremia: Secondary | ICD-10-CM | POA: Diagnosis not present

## 2023-05-22 DIAGNOSIS — J181 Lobar pneumonia, unspecified organism: Secondary | ICD-10-CM | POA: Diagnosis not present

## 2023-05-25 DIAGNOSIS — E785 Hyperlipidemia, unspecified: Secondary | ICD-10-CM | POA: Diagnosis not present

## 2023-05-25 DIAGNOSIS — D649 Anemia, unspecified: Secondary | ICD-10-CM | POA: Diagnosis not present

## 2023-05-25 DIAGNOSIS — I679 Cerebrovascular disease, unspecified: Secondary | ICD-10-CM | POA: Diagnosis not present

## 2023-05-25 DIAGNOSIS — E039 Hypothyroidism, unspecified: Secondary | ICD-10-CM | POA: Diagnosis not present

## 2023-05-25 DIAGNOSIS — F0390 Unspecified dementia without behavioral disturbance: Secondary | ICD-10-CM | POA: Diagnosis not present

## 2023-05-25 DIAGNOSIS — I69921 Dysphasia following unspecified cerebrovascular disease: Secondary | ICD-10-CM | POA: Diagnosis not present

## 2023-05-25 DIAGNOSIS — K219 Gastro-esophageal reflux disease without esophagitis: Secondary | ICD-10-CM | POA: Diagnosis not present

## 2023-05-25 DIAGNOSIS — N1832 Chronic kidney disease, stage 3b: Secondary | ICD-10-CM | POA: Diagnosis not present

## 2023-05-26 DIAGNOSIS — N1832 Chronic kidney disease, stage 3b: Secondary | ICD-10-CM | POA: Diagnosis not present

## 2023-05-26 DIAGNOSIS — I129 Hypertensive chronic kidney disease with stage 1 through stage 4 chronic kidney disease, or unspecified chronic kidney disease: Secondary | ICD-10-CM | POA: Diagnosis not present

## 2023-05-26 DIAGNOSIS — E039 Hypothyroidism, unspecified: Secondary | ICD-10-CM | POA: Diagnosis not present

## 2023-05-26 DIAGNOSIS — K219 Gastro-esophageal reflux disease without esophagitis: Secondary | ICD-10-CM | POA: Diagnosis not present

## 2023-05-26 DIAGNOSIS — I69921 Dysphasia following unspecified cerebrovascular disease: Secondary | ICD-10-CM | POA: Diagnosis not present

## 2023-05-26 DIAGNOSIS — F0390 Unspecified dementia without behavioral disturbance: Secondary | ICD-10-CM | POA: Diagnosis not present

## 2023-05-26 DIAGNOSIS — I679 Cerebrovascular disease, unspecified: Secondary | ICD-10-CM | POA: Diagnosis not present

## 2023-05-26 DIAGNOSIS — J181 Lobar pneumonia, unspecified organism: Secondary | ICD-10-CM | POA: Diagnosis not present

## 2023-05-26 DIAGNOSIS — F32A Depression, unspecified: Secondary | ICD-10-CM | POA: Diagnosis not present

## 2023-05-27 DIAGNOSIS — K219 Gastro-esophageal reflux disease without esophagitis: Secondary | ICD-10-CM | POA: Diagnosis not present

## 2023-05-27 DIAGNOSIS — F0390 Unspecified dementia without behavioral disturbance: Secondary | ICD-10-CM | POA: Diagnosis not present

## 2023-05-27 DIAGNOSIS — I69921 Dysphasia following unspecified cerebrovascular disease: Secondary | ICD-10-CM | POA: Diagnosis not present

## 2023-05-27 DIAGNOSIS — E039 Hypothyroidism, unspecified: Secondary | ICD-10-CM | POA: Diagnosis not present

## 2023-05-27 DIAGNOSIS — I679 Cerebrovascular disease, unspecified: Secondary | ICD-10-CM | POA: Diagnosis not present

## 2023-05-27 DIAGNOSIS — N1832 Chronic kidney disease, stage 3b: Secondary | ICD-10-CM | POA: Diagnosis not present

## 2023-05-28 DIAGNOSIS — I679 Cerebrovascular disease, unspecified: Secondary | ICD-10-CM | POA: Diagnosis not present

## 2023-05-28 DIAGNOSIS — K219 Gastro-esophageal reflux disease without esophagitis: Secondary | ICD-10-CM | POA: Diagnosis not present

## 2023-05-28 DIAGNOSIS — N1832 Chronic kidney disease, stage 3b: Secondary | ICD-10-CM | POA: Diagnosis not present

## 2023-05-28 DIAGNOSIS — F0390 Unspecified dementia without behavioral disturbance: Secondary | ICD-10-CM | POA: Diagnosis not present

## 2023-05-28 DIAGNOSIS — E039 Hypothyroidism, unspecified: Secondary | ICD-10-CM | POA: Diagnosis not present

## 2023-05-28 DIAGNOSIS — I69921 Dysphasia following unspecified cerebrovascular disease: Secondary | ICD-10-CM | POA: Diagnosis not present

## 2023-05-29 DIAGNOSIS — F0390 Unspecified dementia without behavioral disturbance: Secondary | ICD-10-CM | POA: Diagnosis not present

## 2023-05-29 DIAGNOSIS — I69921 Dysphasia following unspecified cerebrovascular disease: Secondary | ICD-10-CM | POA: Diagnosis not present

## 2023-05-29 DIAGNOSIS — I679 Cerebrovascular disease, unspecified: Secondary | ICD-10-CM | POA: Diagnosis not present

## 2023-05-29 DIAGNOSIS — E039 Hypothyroidism, unspecified: Secondary | ICD-10-CM | POA: Diagnosis not present

## 2023-05-29 DIAGNOSIS — K219 Gastro-esophageal reflux disease without esophagitis: Secondary | ICD-10-CM | POA: Diagnosis not present

## 2023-05-29 DIAGNOSIS — N1832 Chronic kidney disease, stage 3b: Secondary | ICD-10-CM | POA: Diagnosis not present

## 2023-05-30 DIAGNOSIS — I69921 Dysphasia following unspecified cerebrovascular disease: Secondary | ICD-10-CM | POA: Diagnosis not present

## 2023-05-30 DIAGNOSIS — E039 Hypothyroidism, unspecified: Secondary | ICD-10-CM | POA: Diagnosis not present

## 2023-05-30 DIAGNOSIS — F0390 Unspecified dementia without behavioral disturbance: Secondary | ICD-10-CM | POA: Diagnosis not present

## 2023-05-30 DIAGNOSIS — N1832 Chronic kidney disease, stage 3b: Secondary | ICD-10-CM | POA: Diagnosis not present

## 2023-05-30 DIAGNOSIS — I679 Cerebrovascular disease, unspecified: Secondary | ICD-10-CM | POA: Diagnosis not present

## 2023-05-30 DIAGNOSIS — K219 Gastro-esophageal reflux disease without esophagitis: Secondary | ICD-10-CM | POA: Diagnosis not present

## 2023-05-31 DIAGNOSIS — K219 Gastro-esophageal reflux disease without esophagitis: Secondary | ICD-10-CM | POA: Diagnosis not present

## 2023-05-31 DIAGNOSIS — E039 Hypothyroidism, unspecified: Secondary | ICD-10-CM | POA: Diagnosis not present

## 2023-05-31 DIAGNOSIS — F0390 Unspecified dementia without behavioral disturbance: Secondary | ICD-10-CM | POA: Diagnosis not present

## 2023-05-31 DIAGNOSIS — I69921 Dysphasia following unspecified cerebrovascular disease: Secondary | ICD-10-CM | POA: Diagnosis not present

## 2023-05-31 DIAGNOSIS — I679 Cerebrovascular disease, unspecified: Secondary | ICD-10-CM | POA: Diagnosis not present

## 2023-05-31 DIAGNOSIS — N1832 Chronic kidney disease, stage 3b: Secondary | ICD-10-CM | POA: Diagnosis not present

## 2023-06-02 DIAGNOSIS — K219 Gastro-esophageal reflux disease without esophagitis: Secondary | ICD-10-CM | POA: Diagnosis not present

## 2023-06-02 DIAGNOSIS — N1832 Chronic kidney disease, stage 3b: Secondary | ICD-10-CM | POA: Diagnosis not present

## 2023-06-02 DIAGNOSIS — F0390 Unspecified dementia without behavioral disturbance: Secondary | ICD-10-CM | POA: Diagnosis not present

## 2023-06-02 DIAGNOSIS — E039 Hypothyroidism, unspecified: Secondary | ICD-10-CM | POA: Diagnosis not present

## 2023-06-02 DIAGNOSIS — I679 Cerebrovascular disease, unspecified: Secondary | ICD-10-CM | POA: Diagnosis not present

## 2023-06-02 DIAGNOSIS — I69921 Dysphasia following unspecified cerebrovascular disease: Secondary | ICD-10-CM | POA: Diagnosis not present

## 2023-06-04 DIAGNOSIS — I129 Hypertensive chronic kidney disease with stage 1 through stage 4 chronic kidney disease, or unspecified chronic kidney disease: Secondary | ICD-10-CM | POA: Diagnosis not present

## 2023-06-04 DIAGNOSIS — F419 Anxiety disorder, unspecified: Secondary | ICD-10-CM | POA: Diagnosis not present

## 2023-06-04 DIAGNOSIS — J181 Lobar pneumonia, unspecified organism: Secondary | ICD-10-CM | POA: Diagnosis not present

## 2023-06-07 DIAGNOSIS — F0390 Unspecified dementia without behavioral disturbance: Secondary | ICD-10-CM | POA: Diagnosis not present

## 2023-06-07 DIAGNOSIS — K219 Gastro-esophageal reflux disease without esophagitis: Secondary | ICD-10-CM | POA: Diagnosis not present

## 2023-06-07 DIAGNOSIS — I679 Cerebrovascular disease, unspecified: Secondary | ICD-10-CM | POA: Diagnosis not present

## 2023-06-07 DIAGNOSIS — E039 Hypothyroidism, unspecified: Secondary | ICD-10-CM | POA: Diagnosis not present

## 2023-06-07 DIAGNOSIS — N1832 Chronic kidney disease, stage 3b: Secondary | ICD-10-CM | POA: Diagnosis not present

## 2023-06-07 DIAGNOSIS — I69921 Dysphasia following unspecified cerebrovascular disease: Secondary | ICD-10-CM | POA: Diagnosis not present

## 2023-06-14 DIAGNOSIS — E039 Hypothyroidism, unspecified: Secondary | ICD-10-CM | POA: Diagnosis not present

## 2023-06-14 DIAGNOSIS — N1832 Chronic kidney disease, stage 3b: Secondary | ICD-10-CM | POA: Diagnosis not present

## 2023-06-14 DIAGNOSIS — I69921 Dysphasia following unspecified cerebrovascular disease: Secondary | ICD-10-CM | POA: Diagnosis not present

## 2023-06-14 DIAGNOSIS — I679 Cerebrovascular disease, unspecified: Secondary | ICD-10-CM | POA: Diagnosis not present

## 2023-06-14 DIAGNOSIS — F0390 Unspecified dementia without behavioral disturbance: Secondary | ICD-10-CM | POA: Diagnosis not present

## 2023-06-14 DIAGNOSIS — K219 Gastro-esophageal reflux disease without esophagitis: Secondary | ICD-10-CM | POA: Diagnosis not present

## 2023-06-17 DIAGNOSIS — N1832 Chronic kidney disease, stage 3b: Secondary | ICD-10-CM | POA: Diagnosis not present

## 2023-06-17 DIAGNOSIS — I69921 Dysphasia following unspecified cerebrovascular disease: Secondary | ICD-10-CM | POA: Diagnosis not present

## 2023-06-17 DIAGNOSIS — I679 Cerebrovascular disease, unspecified: Secondary | ICD-10-CM | POA: Diagnosis not present

## 2023-06-17 DIAGNOSIS — E039 Hypothyroidism, unspecified: Secondary | ICD-10-CM | POA: Diagnosis not present

## 2023-06-17 DIAGNOSIS — F0390 Unspecified dementia without behavioral disturbance: Secondary | ICD-10-CM | POA: Diagnosis not present

## 2023-06-17 DIAGNOSIS — K219 Gastro-esophageal reflux disease without esophagitis: Secondary | ICD-10-CM | POA: Diagnosis not present

## 2023-06-18 DIAGNOSIS — K219 Gastro-esophageal reflux disease without esophagitis: Secondary | ICD-10-CM | POA: Diagnosis not present

## 2023-06-18 DIAGNOSIS — I679 Cerebrovascular disease, unspecified: Secondary | ICD-10-CM | POA: Diagnosis not present

## 2023-06-18 DIAGNOSIS — F0390 Unspecified dementia without behavioral disturbance: Secondary | ICD-10-CM | POA: Diagnosis not present

## 2023-06-18 DIAGNOSIS — N1832 Chronic kidney disease, stage 3b: Secondary | ICD-10-CM | POA: Diagnosis not present

## 2023-06-18 DIAGNOSIS — E039 Hypothyroidism, unspecified: Secondary | ICD-10-CM | POA: Diagnosis not present

## 2023-06-18 DIAGNOSIS — I69921 Dysphasia following unspecified cerebrovascular disease: Secondary | ICD-10-CM | POA: Diagnosis not present

## 2023-06-19 DIAGNOSIS — N1832 Chronic kidney disease, stage 3b: Secondary | ICD-10-CM | POA: Diagnosis not present

## 2023-06-19 DIAGNOSIS — I679 Cerebrovascular disease, unspecified: Secondary | ICD-10-CM | POA: Diagnosis not present

## 2023-06-19 DIAGNOSIS — K219 Gastro-esophageal reflux disease without esophagitis: Secondary | ICD-10-CM | POA: Diagnosis not present

## 2023-06-19 DIAGNOSIS — E039 Hypothyroidism, unspecified: Secondary | ICD-10-CM | POA: Diagnosis not present

## 2023-06-19 DIAGNOSIS — I69921 Dysphasia following unspecified cerebrovascular disease: Secondary | ICD-10-CM | POA: Diagnosis not present

## 2023-06-19 DIAGNOSIS — F0390 Unspecified dementia without behavioral disturbance: Secondary | ICD-10-CM | POA: Diagnosis not present

## 2023-06-20 DIAGNOSIS — I69921 Dysphasia following unspecified cerebrovascular disease: Secondary | ICD-10-CM | POA: Diagnosis not present

## 2023-06-20 DIAGNOSIS — E039 Hypothyroidism, unspecified: Secondary | ICD-10-CM | POA: Diagnosis not present

## 2023-06-20 DIAGNOSIS — F0390 Unspecified dementia without behavioral disturbance: Secondary | ICD-10-CM | POA: Diagnosis not present

## 2023-06-20 DIAGNOSIS — D649 Anemia, unspecified: Secondary | ICD-10-CM | POA: Diagnosis not present

## 2023-06-20 DIAGNOSIS — N1832 Chronic kidney disease, stage 3b: Secondary | ICD-10-CM | POA: Diagnosis not present

## 2023-06-20 DIAGNOSIS — I679 Cerebrovascular disease, unspecified: Secondary | ICD-10-CM | POA: Diagnosis not present

## 2023-06-20 DIAGNOSIS — E785 Hyperlipidemia, unspecified: Secondary | ICD-10-CM | POA: Diagnosis not present

## 2023-06-20 DIAGNOSIS — K219 Gastro-esophageal reflux disease without esophagitis: Secondary | ICD-10-CM | POA: Diagnosis not present

## 2023-06-21 DIAGNOSIS — N1832 Chronic kidney disease, stage 3b: Secondary | ICD-10-CM | POA: Diagnosis not present

## 2023-06-21 DIAGNOSIS — F0390 Unspecified dementia without behavioral disturbance: Secondary | ICD-10-CM | POA: Diagnosis not present

## 2023-06-21 DIAGNOSIS — I69921 Dysphasia following unspecified cerebrovascular disease: Secondary | ICD-10-CM | POA: Diagnosis not present

## 2023-06-21 DIAGNOSIS — I679 Cerebrovascular disease, unspecified: Secondary | ICD-10-CM | POA: Diagnosis not present

## 2023-06-21 DIAGNOSIS — E039 Hypothyroidism, unspecified: Secondary | ICD-10-CM | POA: Diagnosis not present

## 2023-06-21 DIAGNOSIS — K219 Gastro-esophageal reflux disease without esophagitis: Secondary | ICD-10-CM | POA: Diagnosis not present

## 2023-06-22 DIAGNOSIS — I69921 Dysphasia following unspecified cerebrovascular disease: Secondary | ICD-10-CM | POA: Diagnosis not present

## 2023-06-22 DIAGNOSIS — I679 Cerebrovascular disease, unspecified: Secondary | ICD-10-CM | POA: Diagnosis not present

## 2023-06-22 DIAGNOSIS — F0390 Unspecified dementia without behavioral disturbance: Secondary | ICD-10-CM | POA: Diagnosis not present

## 2023-06-22 DIAGNOSIS — K219 Gastro-esophageal reflux disease without esophagitis: Secondary | ICD-10-CM | POA: Diagnosis not present

## 2023-06-22 DIAGNOSIS — N1832 Chronic kidney disease, stage 3b: Secondary | ICD-10-CM | POA: Diagnosis not present

## 2023-06-22 DIAGNOSIS — E039 Hypothyroidism, unspecified: Secondary | ICD-10-CM | POA: Diagnosis not present

## 2023-06-23 DIAGNOSIS — E039 Hypothyroidism, unspecified: Secondary | ICD-10-CM | POA: Diagnosis not present

## 2023-06-23 DIAGNOSIS — I679 Cerebrovascular disease, unspecified: Secondary | ICD-10-CM | POA: Diagnosis not present

## 2023-06-23 DIAGNOSIS — K219 Gastro-esophageal reflux disease without esophagitis: Secondary | ICD-10-CM | POA: Diagnosis not present

## 2023-06-23 DIAGNOSIS — N1832 Chronic kidney disease, stage 3b: Secondary | ICD-10-CM | POA: Diagnosis not present

## 2023-06-23 DIAGNOSIS — I69921 Dysphasia following unspecified cerebrovascular disease: Secondary | ICD-10-CM | POA: Diagnosis not present

## 2023-06-23 DIAGNOSIS — F0390 Unspecified dementia without behavioral disturbance: Secondary | ICD-10-CM | POA: Diagnosis not present

## 2023-06-24 DIAGNOSIS — K219 Gastro-esophageal reflux disease without esophagitis: Secondary | ICD-10-CM | POA: Diagnosis not present

## 2023-06-24 DIAGNOSIS — I69921 Dysphasia following unspecified cerebrovascular disease: Secondary | ICD-10-CM | POA: Diagnosis not present

## 2023-06-24 DIAGNOSIS — F0390 Unspecified dementia without behavioral disturbance: Secondary | ICD-10-CM | POA: Diagnosis not present

## 2023-06-24 DIAGNOSIS — I679 Cerebrovascular disease, unspecified: Secondary | ICD-10-CM | POA: Diagnosis not present

## 2023-06-24 DIAGNOSIS — E039 Hypothyroidism, unspecified: Secondary | ICD-10-CM | POA: Diagnosis not present

## 2023-06-24 DIAGNOSIS — N1832 Chronic kidney disease, stage 3b: Secondary | ICD-10-CM | POA: Diagnosis not present

## 2023-06-25 DIAGNOSIS — E039 Hypothyroidism, unspecified: Secondary | ICD-10-CM | POA: Diagnosis not present

## 2023-06-25 DIAGNOSIS — N1832 Chronic kidney disease, stage 3b: Secondary | ICD-10-CM | POA: Diagnosis not present

## 2023-06-25 DIAGNOSIS — I679 Cerebrovascular disease, unspecified: Secondary | ICD-10-CM | POA: Diagnosis not present

## 2023-06-25 DIAGNOSIS — K219 Gastro-esophageal reflux disease without esophagitis: Secondary | ICD-10-CM | POA: Diagnosis not present

## 2023-06-25 DIAGNOSIS — I69921 Dysphasia following unspecified cerebrovascular disease: Secondary | ICD-10-CM | POA: Diagnosis not present

## 2023-06-25 DIAGNOSIS — F0390 Unspecified dementia without behavioral disturbance: Secondary | ICD-10-CM | POA: Diagnosis not present

## 2023-06-26 DIAGNOSIS — E039 Hypothyroidism, unspecified: Secondary | ICD-10-CM | POA: Diagnosis not present

## 2023-06-26 DIAGNOSIS — N1832 Chronic kidney disease, stage 3b: Secondary | ICD-10-CM | POA: Diagnosis not present

## 2023-06-26 DIAGNOSIS — I679 Cerebrovascular disease, unspecified: Secondary | ICD-10-CM | POA: Diagnosis not present

## 2023-06-26 DIAGNOSIS — F0390 Unspecified dementia without behavioral disturbance: Secondary | ICD-10-CM | POA: Diagnosis not present

## 2023-06-26 DIAGNOSIS — I69921 Dysphasia following unspecified cerebrovascular disease: Secondary | ICD-10-CM | POA: Diagnosis not present

## 2023-06-26 DIAGNOSIS — K219 Gastro-esophageal reflux disease without esophagitis: Secondary | ICD-10-CM | POA: Diagnosis not present

## 2023-06-27 DIAGNOSIS — I69921 Dysphasia following unspecified cerebrovascular disease: Secondary | ICD-10-CM | POA: Diagnosis not present

## 2023-06-27 DIAGNOSIS — I679 Cerebrovascular disease, unspecified: Secondary | ICD-10-CM | POA: Diagnosis not present

## 2023-06-27 DIAGNOSIS — K219 Gastro-esophageal reflux disease without esophagitis: Secondary | ICD-10-CM | POA: Diagnosis not present

## 2023-06-27 DIAGNOSIS — F0390 Unspecified dementia without behavioral disturbance: Secondary | ICD-10-CM | POA: Diagnosis not present

## 2023-06-27 DIAGNOSIS — E039 Hypothyroidism, unspecified: Secondary | ICD-10-CM | POA: Diagnosis not present

## 2023-06-27 DIAGNOSIS — N1832 Chronic kidney disease, stage 3b: Secondary | ICD-10-CM | POA: Diagnosis not present

## 2023-06-28 DIAGNOSIS — I69921 Dysphasia following unspecified cerebrovascular disease: Secondary | ICD-10-CM | POA: Diagnosis not present

## 2023-06-28 DIAGNOSIS — N1832 Chronic kidney disease, stage 3b: Secondary | ICD-10-CM | POA: Diagnosis not present

## 2023-06-28 DIAGNOSIS — E039 Hypothyroidism, unspecified: Secondary | ICD-10-CM | POA: Diagnosis not present

## 2023-06-28 DIAGNOSIS — K219 Gastro-esophageal reflux disease without esophagitis: Secondary | ICD-10-CM | POA: Diagnosis not present

## 2023-06-28 DIAGNOSIS — F0390 Unspecified dementia without behavioral disturbance: Secondary | ICD-10-CM | POA: Diagnosis not present

## 2023-06-28 DIAGNOSIS — I679 Cerebrovascular disease, unspecified: Secondary | ICD-10-CM | POA: Diagnosis not present

## 2023-06-29 DIAGNOSIS — K219 Gastro-esophageal reflux disease without esophagitis: Secondary | ICD-10-CM | POA: Diagnosis not present

## 2023-06-29 DIAGNOSIS — I679 Cerebrovascular disease, unspecified: Secondary | ICD-10-CM | POA: Diagnosis not present

## 2023-06-29 DIAGNOSIS — N1832 Chronic kidney disease, stage 3b: Secondary | ICD-10-CM | POA: Diagnosis not present

## 2023-06-29 DIAGNOSIS — E039 Hypothyroidism, unspecified: Secondary | ICD-10-CM | POA: Diagnosis not present

## 2023-06-29 DIAGNOSIS — I69921 Dysphasia following unspecified cerebrovascular disease: Secondary | ICD-10-CM | POA: Diagnosis not present

## 2023-06-29 DIAGNOSIS — F0390 Unspecified dementia without behavioral disturbance: Secondary | ICD-10-CM | POA: Diagnosis not present

## 2023-07-21 DEATH — deceased
